# Patient Record
Sex: Female | Born: 1940 | Race: White | Hispanic: No | Smoking: Never smoker
Health system: Southern US, Community
[De-identification: ages and names within clinical notes are randomized; demographics above are authoritative.]

## PROBLEM LIST (undated history)

## (undated) DIAGNOSIS — M419 Scoliosis, unspecified: Secondary | ICD-10-CM

## (undated) DIAGNOSIS — D472 Monoclonal gammopathy: Secondary | ICD-10-CM

## (undated) DIAGNOSIS — I2510 Atherosclerotic heart disease of native coronary artery without angina pectoris: Secondary | ICD-10-CM

## (undated) DIAGNOSIS — T7840XA Allergy, unspecified, initial encounter: Secondary | ICD-10-CM

## (undated) DIAGNOSIS — K7689 Other specified diseases of liver: Secondary | ICD-10-CM

## (undated) DIAGNOSIS — M899 Disorder of bone, unspecified: Secondary | ICD-10-CM

## (undated) DIAGNOSIS — R209 Unspecified disturbances of skin sensation: Secondary | ICD-10-CM

## (undated) DIAGNOSIS — I8390 Asymptomatic varicose veins of unspecified lower extremity: Secondary | ICD-10-CM

## (undated) DIAGNOSIS — I428 Other cardiomyopathies: Secondary | ICD-10-CM

## (undated) DIAGNOSIS — I10 Essential (primary) hypertension: Secondary | ICD-10-CM

## (undated) DIAGNOSIS — I493 Ventricular premature depolarization: Secondary | ICD-10-CM

## (undated) DIAGNOSIS — M545 Low back pain: Secondary | ICD-10-CM

## (undated) DIAGNOSIS — K5790 Diverticulosis of intestine, part unspecified, without perforation or abscess without bleeding: Secondary | ICD-10-CM

## (undated) DIAGNOSIS — F329 Major depressive disorder, single episode, unspecified: Secondary | ICD-10-CM

## (undated) DIAGNOSIS — M949 Disorder of cartilage, unspecified: Secondary | ICD-10-CM

## (undated) DIAGNOSIS — D1803 Hemangioma of intra-abdominal structures: Secondary | ICD-10-CM

## (undated) DIAGNOSIS — E039 Hypothyroidism, unspecified: Secondary | ICD-10-CM

## (undated) DIAGNOSIS — M62838 Other muscle spasm: Secondary | ICD-10-CM

## (undated) DIAGNOSIS — K219 Gastro-esophageal reflux disease without esophagitis: Secondary | ICD-10-CM

## (undated) DIAGNOSIS — M1990 Unspecified osteoarthritis, unspecified site: Secondary | ICD-10-CM

## (undated) HISTORY — PX: OVARIAN CYST REMOVAL: SHX89

## (undated) HISTORY — DX: Monoclonal gammopathy: D47.2

## (undated) HISTORY — DX: Scoliosis, unspecified: M41.9

## (undated) HISTORY — DX: Allergy, unspecified, initial encounter: T78.40XA

## (undated) HISTORY — DX: Disorder of cartilage, unspecified: M94.9

## (undated) HISTORY — DX: Unspecified osteoarthritis, unspecified site: M19.90

## (undated) HISTORY — DX: Essential (primary) hypertension: I10

## (undated) HISTORY — DX: Other cardiomyopathies: I42.8

## (undated) HISTORY — PX: APPENDECTOMY: SHX54

## (undated) HISTORY — DX: Other muscle spasm: M62.838

## (undated) HISTORY — DX: Major depressive disorder, single episode, unspecified: F32.9

## (undated) HISTORY — DX: Diverticulosis of intestine, part unspecified, without perforation or abscess without bleeding: K57.90

## (undated) HISTORY — DX: Disorder of bone, unspecified: M89.9

## (undated) HISTORY — DX: Asymptomatic varicose veins of unspecified lower extremity: I83.90

## (undated) HISTORY — DX: Hypothyroidism, unspecified: E03.9

## (undated) HISTORY — DX: Low back pain: M54.5

## (undated) HISTORY — DX: Unspecified disturbances of skin sensation: R20.9

## (undated) HISTORY — DX: Ventricular premature depolarization: I49.3

## (undated) HISTORY — PX: BUNIONECTOMY: SHX129

## (undated) HISTORY — PX: MOHS SURGERY: SHX181

## (undated) HISTORY — PX: CATARACT EXTRACTION: SUR2

## (undated) HISTORY — PX: CARPAL TUNNEL RELEASE: SHX101

## (undated) HISTORY — DX: Hemangioma of intra-abdominal structures: D18.03

## (undated) HISTORY — DX: Other specified diseases of liver: K76.89

## (undated) HISTORY — DX: Atherosclerotic heart disease of native coronary artery without angina pectoris: I25.10

## (undated) HISTORY — DX: Gastro-esophageal reflux disease without esophagitis: K21.9

## (undated) HISTORY — PX: BACK SURGERY: SHX140

## (undated) HISTORY — PX: TONSILLECTOMY: SUR1361

---

## 1965-02-01 HISTORY — PX: OOPHORECTOMY: SHX86

## 1979-02-02 HISTORY — PX: ABDOMINAL HYSTERECTOMY: SHX81

## 1983-02-02 HISTORY — PX: BUNIONECTOMY: SHX129

## 1984-02-02 HISTORY — PX: HAMMER TOE SURGERY: SHX385

## 1985-02-01 HISTORY — PX: HAMMER TOE SURGERY: SHX385

## 1990-02-01 HISTORY — PX: CARPAL TUNNEL RELEASE: SHX101

## 1997-12-02 LAB — HM MAMMOGRAPHY

## 1998-05-30 ENCOUNTER — Encounter: Payer: Self-pay | Admitting: Orthopedic Surgery

## 1998-05-30 ENCOUNTER — Ambulatory Visit (HOSPITAL_COMMUNITY): Admission: RE | Admit: 1998-05-30 | Discharge: 1998-05-30 | Payer: Self-pay | Admitting: Orthopedic Surgery

## 1999-03-03 ENCOUNTER — Other Ambulatory Visit: Admission: RE | Admit: 1999-03-03 | Discharge: 1999-03-03 | Payer: Self-pay | Admitting: Obstetrics and Gynecology

## 1999-03-31 ENCOUNTER — Encounter: Payer: Self-pay | Admitting: Obstetrics and Gynecology

## 1999-03-31 ENCOUNTER — Encounter: Admission: RE | Admit: 1999-03-31 | Discharge: 1999-03-31 | Payer: Self-pay | Admitting: Obstetrics and Gynecology

## 2000-02-12 ENCOUNTER — Ambulatory Visit (HOSPITAL_COMMUNITY): Admission: RE | Admit: 2000-02-12 | Discharge: 2000-02-12 | Payer: Self-pay | Admitting: Internal Medicine

## 2000-02-12 ENCOUNTER — Encounter: Payer: Self-pay | Admitting: Internal Medicine

## 2000-05-06 ENCOUNTER — Encounter: Payer: Self-pay | Admitting: Emergency Medicine

## 2000-05-06 ENCOUNTER — Emergency Department (HOSPITAL_COMMUNITY): Admission: EM | Admit: 2000-05-06 | Discharge: 2000-05-06 | Payer: Self-pay | Admitting: Emergency Medicine

## 2001-02-15 ENCOUNTER — Encounter (INDEPENDENT_AMBULATORY_CARE_PROVIDER_SITE_OTHER): Payer: Self-pay | Admitting: Specialist

## 2001-02-15 ENCOUNTER — Ambulatory Visit (HOSPITAL_COMMUNITY): Admission: RE | Admit: 2001-02-15 | Discharge: 2001-02-15 | Payer: Self-pay | Admitting: Gastroenterology

## 2001-04-27 ENCOUNTER — Other Ambulatory Visit: Admission: RE | Admit: 2001-04-27 | Discharge: 2001-04-27 | Payer: Self-pay | Admitting: Obstetrics and Gynecology

## 2002-05-10 ENCOUNTER — Other Ambulatory Visit: Admission: RE | Admit: 2002-05-10 | Discharge: 2002-05-10 | Payer: Self-pay | Admitting: Obstetrics and Gynecology

## 2002-11-27 ENCOUNTER — Encounter (INDEPENDENT_AMBULATORY_CARE_PROVIDER_SITE_OTHER): Payer: Self-pay | Admitting: Specialist

## 2002-11-27 ENCOUNTER — Ambulatory Visit (HOSPITAL_COMMUNITY): Admission: RE | Admit: 2002-11-27 | Discharge: 2002-11-27 | Payer: Self-pay | Admitting: Oncology

## 2002-12-11 ENCOUNTER — Ambulatory Visit (HOSPITAL_COMMUNITY): Admission: RE | Admit: 2002-12-11 | Discharge: 2002-12-11 | Payer: Self-pay | Admitting: Internal Medicine

## 2003-12-05 ENCOUNTER — Other Ambulatory Visit: Admission: RE | Admit: 2003-12-05 | Discharge: 2003-12-05 | Payer: Self-pay | Admitting: Obstetrics and Gynecology

## 2003-12-11 ENCOUNTER — Ambulatory Visit: Payer: Self-pay | Admitting: Internal Medicine

## 2003-12-13 ENCOUNTER — Emergency Department (HOSPITAL_COMMUNITY): Admission: EM | Admit: 2003-12-13 | Discharge: 2003-12-13 | Payer: Self-pay | Admitting: Emergency Medicine

## 2003-12-18 ENCOUNTER — Ambulatory Visit (HOSPITAL_COMMUNITY): Admission: RE | Admit: 2003-12-18 | Discharge: 2003-12-18 | Payer: Self-pay | Admitting: Internal Medicine

## 2003-12-18 ENCOUNTER — Ambulatory Visit: Payer: Self-pay | Admitting: Internal Medicine

## 2004-01-01 ENCOUNTER — Ambulatory Visit: Payer: Self-pay | Admitting: Internal Medicine

## 2004-01-29 ENCOUNTER — Ambulatory Visit: Payer: Self-pay | Admitting: Internal Medicine

## 2004-02-02 HISTORY — PX: LUMBAR FUSION: SHX111

## 2004-02-17 ENCOUNTER — Ambulatory Visit: Payer: Self-pay | Admitting: Oncology

## 2004-03-03 ENCOUNTER — Ambulatory Visit: Payer: Self-pay | Admitting: Internal Medicine

## 2004-03-17 ENCOUNTER — Inpatient Hospital Stay (HOSPITAL_COMMUNITY): Admission: RE | Admit: 2004-03-17 | Discharge: 2004-03-24 | Payer: Self-pay | Admitting: Neurosurgery

## 2004-03-17 ENCOUNTER — Ambulatory Visit: Payer: Self-pay | Admitting: Physical Medicine & Rehabilitation

## 2004-03-24 ENCOUNTER — Inpatient Hospital Stay
Admission: RE | Admit: 2004-03-24 | Discharge: 2004-03-31 | Payer: Self-pay | Admitting: Physical Medicine & Rehabilitation

## 2004-06-03 ENCOUNTER — Ambulatory Visit: Payer: Self-pay | Admitting: Internal Medicine

## 2004-06-12 ENCOUNTER — Ambulatory Visit: Payer: Self-pay | Admitting: Oncology

## 2004-08-03 ENCOUNTER — Ambulatory Visit: Payer: Self-pay | Admitting: Internal Medicine

## 2004-08-05 ENCOUNTER — Ambulatory Visit: Payer: Self-pay | Admitting: Internal Medicine

## 2004-10-07 ENCOUNTER — Ambulatory Visit: Payer: Self-pay | Admitting: Internal Medicine

## 2004-11-02 ENCOUNTER — Ambulatory Visit: Payer: Self-pay | Admitting: Internal Medicine

## 2004-12-08 ENCOUNTER — Ambulatory Visit: Payer: Self-pay | Admitting: Internal Medicine

## 2005-02-09 ENCOUNTER — Ambulatory Visit: Payer: Self-pay | Admitting: Internal Medicine

## 2005-04-13 ENCOUNTER — Ambulatory Visit: Payer: Self-pay | Admitting: Internal Medicine

## 2005-05-13 ENCOUNTER — Ambulatory Visit: Payer: Self-pay | Admitting: Internal Medicine

## 2005-06-14 ENCOUNTER — Ambulatory Visit: Payer: Self-pay | Admitting: Oncology

## 2005-06-17 LAB — IGG, IGA, IGM
IgA: 52 mg/dL — ABNORMAL LOW (ref 68–378)
IgG (Immunoglobin G), Serum: 470 mg/dL — ABNORMAL LOW (ref 694–1618)
IgM, Serum: 1000 mg/dL — ABNORMAL HIGH (ref 60–263)

## 2005-06-17 LAB — COMPREHENSIVE METABOLIC PANEL
ALT: 22 U/L (ref 0–40)
AST: 21 U/L (ref 0–37)
Albumin: 4 g/dL (ref 3.5–5.2)
Alkaline Phosphatase: 70 U/L (ref 39–117)
BUN: 19 mg/dL (ref 6–23)
CO2: 27 mEq/L (ref 19–32)
Calcium: 8.6 mg/dL (ref 8.4–10.5)
Chloride: 102 mEq/L (ref 96–112)
Creatinine, Ser: 1.1 mg/dL (ref 0.4–1.2)
Glucose, Bld: 95 mg/dL (ref 70–99)
Potassium: 4.6 mEq/L (ref 3.5–5.3)
Sodium: 137 mEq/L (ref 135–145)
Total Bilirubin: 0.2 mg/dL — ABNORMAL LOW (ref 0.3–1.2)
Total Protein: 6.8 g/dL (ref 6.0–8.3)

## 2005-06-17 LAB — LACTATE DEHYDROGENASE: LDH: 177 U/L (ref 94–250)

## 2005-06-17 LAB — BETA 2 MICROGLOBULIN, SERUM: Beta-2 Microglobulin: 1.41 mg/L (ref 1.01–1.73)

## 2005-08-26 ENCOUNTER — Ambulatory Visit: Payer: Self-pay | Admitting: Internal Medicine

## 2005-12-13 ENCOUNTER — Ambulatory Visit: Payer: Self-pay | Admitting: Internal Medicine

## 2006-02-01 HISTORY — PX: CATARACT EXTRACTION: SUR2

## 2006-03-21 ENCOUNTER — Ambulatory Visit: Payer: Self-pay | Admitting: Vascular Surgery

## 2006-03-29 ENCOUNTER — Ambulatory Visit: Payer: Self-pay | Admitting: Vascular Surgery

## 2006-04-07 ENCOUNTER — Ambulatory Visit: Payer: Self-pay | Admitting: Internal Medicine

## 2006-04-07 LAB — CONVERTED CEMR LAB
ALT: 17 units/L (ref 0–40)
AST: 19 units/L (ref 0–37)
BUN: 18 mg/dL (ref 6–23)
Basophils Absolute: 0.1 10*3/uL (ref 0.0–0.1)
Basophils Relative: 0.8 % (ref 0.0–1.0)
CO2: 34 meq/L — ABNORMAL HIGH (ref 19–32)
Calcium: 9.3 mg/dL (ref 8.4–10.5)
Chloride: 101 meq/L (ref 96–112)
Creatinine, Ser: 1 mg/dL (ref 0.4–1.2)
Eosinophils Absolute: 0.2 10*3/uL (ref 0.0–0.6)
Eosinophils Relative: 2.6 % (ref 0.0–5.0)
GFR calc Af Amer: 72 mL/min
GFR calc non Af Amer: 59 mL/min
Glucose, Bld: 92 mg/dL (ref 70–99)
HCT: 38.4 % (ref 36.0–46.0)
Hemoglobin: 13.4 g/dL (ref 12.0–15.0)
Lymphocytes Relative: 29.9 % (ref 12.0–46.0)
MCHC: 35 g/dL (ref 30.0–36.0)
MCV: 89.9 fL (ref 78.0–100.0)
Monocytes Absolute: 0.8 10*3/uL — ABNORMAL HIGH (ref 0.2–0.7)
Monocytes Relative: 11 % (ref 3.0–11.0)
Neutro Abs: 3.9 10*3/uL (ref 1.4–7.7)
Neutrophils Relative %: 55.7 % (ref 43.0–77.0)
Platelets: 499 10*3/uL — ABNORMAL HIGH (ref 150–400)
Potassium: 4.6 meq/L (ref 3.5–5.1)
RBC: 4.27 M/uL (ref 3.87–5.11)
RDW: 11.5 % (ref 11.5–14.6)
Sodium: 140 meq/L (ref 135–145)
TSH: 1.69 microintl units/mL (ref 0.35–5.50)
Vit D, 1,25-Dihydroxy: 47 (ref 20–57)
WBC: 7.2 10*3/uL (ref 4.5–10.5)

## 2006-06-13 ENCOUNTER — Ambulatory Visit: Payer: Self-pay | Admitting: Oncology

## 2006-06-15 LAB — CBC WITH DIFFERENTIAL/PLATELET
BASO%: 0.7 % (ref 0.0–2.0)
Basophils Absolute: 0.1 10*3/uL (ref 0.0–0.1)
EOS%: 2.6 % (ref 0.0–7.0)
Eosinophils Absolute: 0.2 10*3/uL (ref 0.0–0.5)
HCT: 37.6 % (ref 34.8–46.6)
HGB: 13.1 g/dL (ref 11.6–15.9)
LYMPH%: 31.8 % (ref 14.0–48.0)
MCH: 30.8 pg (ref 26.0–34.0)
MCHC: 34.9 g/dL (ref 32.0–36.0)
MCV: 88.2 fL (ref 81.0–101.0)
MONO#: 0.9 10*3/uL (ref 0.1–0.9)
MONO%: 12 % (ref 0.0–13.0)
NEUT#: 4.1 10*3/uL (ref 1.5–6.5)
NEUT%: 52.9 % (ref 39.6–76.8)
Platelets: 406 10*3/uL — ABNORMAL HIGH (ref 145–400)
RBC: 4.27 10*6/uL (ref 3.70–5.32)
RDW: 13.1 % (ref 11.3–14.5)
WBC: 7.8 10*3/uL (ref 3.9–10.0)
lymph#: 2.5 10*3/uL (ref 0.9–3.3)

## 2006-06-17 LAB — COMPREHENSIVE METABOLIC PANEL
ALT: 20 U/L (ref 0–35)
AST: 21 U/L (ref 0–37)
Albumin: 3.8 g/dL (ref 3.5–5.2)
Alkaline Phosphatase: 67 U/L (ref 39–117)
BUN: 24 mg/dL — ABNORMAL HIGH (ref 6–23)
CO2: 30 mEq/L (ref 19–32)
Calcium: 9.4 mg/dL (ref 8.4–10.5)
Chloride: 99 mEq/L (ref 96–112)
Creatinine, Ser: 0.92 mg/dL (ref 0.40–1.20)
Glucose, Bld: 103 mg/dL — ABNORMAL HIGH (ref 70–99)
Potassium: 4.8 mEq/L (ref 3.5–5.3)
Sodium: 137 mEq/L (ref 135–145)
Total Bilirubin: 0.3 mg/dL (ref 0.3–1.2)
Total Protein: 6.8 g/dL (ref 6.0–8.3)

## 2006-06-17 LAB — IMMUNOFIXATION ELECTROPHORESIS
IgA: 53 mg/dL — ABNORMAL LOW (ref 68–378)
IgG (Immunoglobin G), Serum: 483 mg/dL — ABNORMAL LOW (ref 694–1618)
IgM, Serum: 1060 mg/dL — ABNORMAL HIGH (ref 60–263)
Total Protein, Serum Electrophoresis: 6.8 g/dL (ref 6.0–8.3)

## 2006-06-17 LAB — BETA 2 MICROGLOBULIN, SERUM: Beta-2 Microglobulin: 1.66 mg/L (ref 1.01–1.73)

## 2006-06-21 ENCOUNTER — Ambulatory Visit: Payer: Self-pay | Admitting: Vascular Surgery

## 2006-07-12 ENCOUNTER — Ambulatory Visit: Payer: Self-pay | Admitting: Internal Medicine

## 2006-07-26 ENCOUNTER — Ambulatory Visit: Payer: Self-pay | Admitting: Internal Medicine

## 2006-08-29 ENCOUNTER — Encounter: Payer: Self-pay | Admitting: Internal Medicine

## 2006-08-29 DIAGNOSIS — K219 Gastro-esophageal reflux disease without esophagitis: Secondary | ICD-10-CM | POA: Insufficient documentation

## 2006-08-29 DIAGNOSIS — I8390 Asymptomatic varicose veins of unspecified lower extremity: Secondary | ICD-10-CM | POA: Insufficient documentation

## 2006-08-29 DIAGNOSIS — M159 Polyosteoarthritis, unspecified: Secondary | ICD-10-CM | POA: Insufficient documentation

## 2006-08-29 DIAGNOSIS — F329 Major depressive disorder, single episode, unspecified: Secondary | ICD-10-CM | POA: Insufficient documentation

## 2006-08-29 DIAGNOSIS — M1990 Unspecified osteoarthritis, unspecified site: Secondary | ICD-10-CM

## 2006-08-29 DIAGNOSIS — M545 Low back pain, unspecified: Secondary | ICD-10-CM

## 2006-08-29 DIAGNOSIS — E039 Hypothyroidism, unspecified: Secondary | ICD-10-CM | POA: Insufficient documentation

## 2006-08-29 DIAGNOSIS — M79609 Pain in unspecified limb: Secondary | ICD-10-CM | POA: Insufficient documentation

## 2006-08-29 DIAGNOSIS — F3289 Other specified depressive episodes: Secondary | ICD-10-CM

## 2006-08-29 HISTORY — DX: Other specified depressive episodes: F32.89

## 2006-08-29 HISTORY — DX: Asymptomatic varicose veins of unspecified lower extremity: I83.90

## 2006-08-29 HISTORY — DX: Hypothyroidism, unspecified: E03.9

## 2006-08-29 HISTORY — DX: Low back pain, unspecified: M54.50

## 2006-08-29 HISTORY — DX: Unspecified osteoarthritis, unspecified site: M19.90

## 2006-08-29 HISTORY — DX: Gastro-esophageal reflux disease without esophagitis: K21.9

## 2006-08-29 HISTORY — DX: Major depressive disorder, single episode, unspecified: F32.9

## 2006-10-13 ENCOUNTER — Ambulatory Visit: Payer: Self-pay | Admitting: Internal Medicine

## 2006-10-13 LAB — CONVERTED CEMR LAB
ALT: 24 units/L (ref 0–35)
AST: 26 units/L (ref 0–37)
Albumin: 3.6 g/dL (ref 3.5–5.2)
Alkaline Phosphatase: 68 units/L (ref 39–117)
BUN: 17 mg/dL (ref 6–23)
Bilirubin, Direct: 0.1 mg/dL (ref 0.0–0.3)
CO2: 30 meq/L (ref 19–32)
Calcium: 9.2 mg/dL (ref 8.4–10.5)
Chloride: 105 meq/L (ref 96–112)
Creatinine, Ser: 0.9 mg/dL (ref 0.4–1.2)
GFR calc Af Amer: 81 mL/min
GFR calc non Af Amer: 67 mL/min
Glucose, Bld: 96 mg/dL (ref 70–99)
Potassium: 5 meq/L (ref 3.5–5.1)
Sodium: 140 meq/L (ref 135–145)
TSH: 0.99 microintl units/mL (ref 0.35–5.50)
Total Bilirubin: 0.7 mg/dL (ref 0.3–1.2)
Total Protein: 7.1 g/dL (ref 6.0–8.3)

## 2007-01-06 ENCOUNTER — Encounter: Payer: Self-pay | Admitting: Internal Medicine

## 2007-01-23 ENCOUNTER — Ambulatory Visit: Payer: Self-pay | Admitting: Internal Medicine

## 2007-04-25 ENCOUNTER — Ambulatory Visit: Payer: Self-pay | Admitting: Internal Medicine

## 2007-04-25 DIAGNOSIS — R498 Other voice and resonance disorders: Secondary | ICD-10-CM

## 2007-04-25 DIAGNOSIS — J309 Allergic rhinitis, unspecified: Secondary | ICD-10-CM | POA: Insufficient documentation

## 2007-04-25 HISTORY — DX: Other voice and resonance disorders: R49.8

## 2007-06-07 ENCOUNTER — Encounter: Payer: Self-pay | Admitting: Internal Medicine

## 2007-06-12 ENCOUNTER — Ambulatory Visit: Payer: Self-pay | Admitting: Oncology

## 2007-06-21 ENCOUNTER — Encounter: Payer: Self-pay | Admitting: Internal Medicine

## 2007-07-07 ENCOUNTER — Encounter: Payer: Self-pay | Admitting: Internal Medicine

## 2007-07-28 ENCOUNTER — Encounter: Payer: Self-pay | Admitting: Internal Medicine

## 2007-07-30 ENCOUNTER — Encounter: Payer: Self-pay | Admitting: Internal Medicine

## 2007-07-30 ENCOUNTER — Encounter: Admission: RE | Admit: 2007-07-30 | Discharge: 2007-07-30 | Payer: Self-pay | Admitting: Neurosurgery

## 2007-08-01 ENCOUNTER — Encounter: Payer: Self-pay | Admitting: Internal Medicine

## 2007-08-02 ENCOUNTER — Ambulatory Visit: Payer: Self-pay | Admitting: Internal Medicine

## 2007-08-02 DIAGNOSIS — K7689 Other specified diseases of liver: Secondary | ICD-10-CM | POA: Insufficient documentation

## 2007-08-02 HISTORY — DX: Other specified diseases of liver: K76.89

## 2007-08-08 ENCOUNTER — Encounter: Admission: RE | Admit: 2007-08-08 | Discharge: 2007-08-08 | Payer: Self-pay | Admitting: Internal Medicine

## 2007-10-04 ENCOUNTER — Encounter: Payer: Self-pay | Admitting: Internal Medicine

## 2007-11-10 ENCOUNTER — Ambulatory Visit: Payer: Self-pay | Admitting: Internal Medicine

## 2008-01-03 ENCOUNTER — Encounter: Payer: Self-pay | Admitting: Internal Medicine

## 2008-02-02 HISTORY — PX: LUMBAR FUSION: SHX111

## 2008-02-05 ENCOUNTER — Ambulatory Visit: Payer: Self-pay | Admitting: Internal Medicine

## 2008-02-05 LAB — CONVERTED CEMR LAB
ALT: 21 units/L (ref 0–35)
AST: 25 units/L (ref 0–37)
Albumin: 3.6 g/dL (ref 3.5–5.2)
Alkaline Phosphatase: 74 units/L (ref 39–117)
BUN: 18 mg/dL (ref 6–23)
Bacteria, UA: NEGATIVE
Bilirubin Urine: NEGATIVE
Bilirubin, Direct: 0.1 mg/dL (ref 0.0–0.3)
CO2: 32 meq/L (ref 19–32)
Calcium: 8.9 mg/dL (ref 8.4–10.5)
Chloride: 107 meq/L (ref 96–112)
Cholesterol: 231 mg/dL (ref 0–200)
Creatinine, Ser: 1 mg/dL (ref 0.4–1.2)
Crystals: NEGATIVE
Direct LDL: 131 mg/dL
GFR calc Af Amer: 71 mL/min
GFR calc non Af Amer: 59 mL/min
Glucose, Bld: 107 mg/dL — ABNORMAL HIGH (ref 70–99)
HDL: 80.8 mg/dL (ref >39.0)
Hemoglobin, Urine: NEGATIVE
Ketones, ur: NEGATIVE mg/dL
Mucus, UA: NEGATIVE
Nitrite: NEGATIVE
Potassium: 4.5 meq/L (ref 3.5–5.1)
RBC / HPF: NONE SEEN
Sodium: 142 meq/L (ref 135–145)
Specific Gravity, Urine: 1.015 (ref 1.000–1.03)
TSH: 1.39 microintl units/mL (ref 0.35–5.50)
Total Bilirubin: 0.6 mg/dL (ref 0.3–1.2)
Total CHOL/HDL Ratio: 2.9
Total Protein, Urine: NEGATIVE mg/dL
Total Protein: 6.6 g/dL (ref 6.0–8.3)
Triglycerides: 56 mg/dL (ref 0–149)
Urine Glucose: NEGATIVE mg/dL
Urobilinogen, UA: 0.2 (ref 0.0–1.0)
VLDL: 11 mg/dL (ref 0–40)
pH: 6.5 (ref 5.0–8.0)

## 2008-02-08 ENCOUNTER — Encounter: Admission: RE | Admit: 2008-02-08 | Discharge: 2008-02-08 | Payer: Self-pay | Admitting: General Practice

## 2008-02-13 ENCOUNTER — Ambulatory Visit: Payer: Self-pay | Admitting: Internal Medicine

## 2008-03-26 ENCOUNTER — Encounter: Payer: Self-pay | Admitting: Internal Medicine

## 2008-04-01 ENCOUNTER — Encounter: Admission: RE | Admit: 2008-04-01 | Discharge: 2008-04-01 | Payer: Self-pay | Admitting: Neurosurgery

## 2008-04-10 ENCOUNTER — Encounter: Payer: Self-pay | Admitting: Internal Medicine

## 2008-04-23 ENCOUNTER — Inpatient Hospital Stay (HOSPITAL_COMMUNITY): Admission: RE | Admit: 2008-04-23 | Discharge: 2008-04-29 | Payer: Self-pay | Admitting: Neurosurgery

## 2008-05-22 ENCOUNTER — Encounter: Payer: Self-pay | Admitting: Internal Medicine

## 2008-06-17 ENCOUNTER — Encounter: Payer: Self-pay | Admitting: Internal Medicine

## 2008-06-26 ENCOUNTER — Encounter: Payer: Self-pay | Admitting: Internal Medicine

## 2008-07-31 ENCOUNTER — Encounter: Payer: Self-pay | Admitting: Internal Medicine

## 2008-08-06 ENCOUNTER — Ambulatory Visit: Payer: Self-pay | Admitting: Internal Medicine

## 2008-08-06 LAB — CONVERTED CEMR LAB
BUN: 19 mg/dL (ref 6–23)
CO2: 32 meq/L (ref 19–32)
Calcium: 8.9 mg/dL (ref 8.4–10.5)
Chloride: 103 meq/L (ref 96–112)
Creatinine, Ser: 0.9 mg/dL (ref 0.4–1.2)
GFR calc non Af Amer: 66.14 mL/min (ref >60)
Glucose, Bld: 100 mg/dL — ABNORMAL HIGH (ref 70–99)
Potassium: 4.6 meq/L (ref 3.5–5.1)
Sodium: 142 meq/L (ref 135–145)

## 2008-08-14 ENCOUNTER — Ambulatory Visit: Payer: Self-pay | Admitting: Internal Medicine

## 2008-10-23 ENCOUNTER — Ambulatory Visit: Payer: Self-pay | Admitting: Internal Medicine

## 2008-10-23 ENCOUNTER — Encounter: Payer: Self-pay | Admitting: Internal Medicine

## 2008-10-30 ENCOUNTER — Telehealth: Payer: Self-pay | Admitting: Internal Medicine

## 2008-10-30 ENCOUNTER — Encounter: Payer: Self-pay | Admitting: Internal Medicine

## 2008-10-31 ENCOUNTER — Ambulatory Visit: Payer: Self-pay | Admitting: Vascular Surgery

## 2008-10-31 ENCOUNTER — Encounter: Payer: Self-pay | Admitting: Internal Medicine

## 2008-10-31 ENCOUNTER — Ambulatory Visit: Payer: Self-pay | Admitting: Internal Medicine

## 2008-11-19 ENCOUNTER — Ambulatory Visit: Payer: Self-pay | Admitting: Internal Medicine

## 2008-11-19 DIAGNOSIS — M899 Disorder of bone, unspecified: Secondary | ICD-10-CM

## 2008-11-19 DIAGNOSIS — M949 Disorder of cartilage, unspecified: Secondary | ICD-10-CM

## 2008-11-19 HISTORY — DX: Disorder of bone, unspecified: M89.9

## 2008-11-21 ENCOUNTER — Telehealth: Payer: Self-pay | Admitting: Internal Medicine

## 2008-11-26 ENCOUNTER — Encounter: Payer: Self-pay | Admitting: Internal Medicine

## 2008-11-27 ENCOUNTER — Ambulatory Visit: Payer: Self-pay | Admitting: Internal Medicine

## 2008-11-27 ENCOUNTER — Telehealth: Payer: Self-pay | Admitting: Internal Medicine

## 2008-11-29 ENCOUNTER — Ambulatory Visit: Payer: Self-pay | Admitting: Internal Medicine

## 2009-03-05 ENCOUNTER — Ambulatory Visit: Payer: Self-pay | Admitting: Internal Medicine

## 2009-03-05 DIAGNOSIS — R209 Unspecified disturbances of skin sensation: Secondary | ICD-10-CM

## 2009-03-05 HISTORY — DX: Unspecified disturbances of skin sensation: R20.9

## 2009-03-07 LAB — CONVERTED CEMR LAB
ALT: 16 units/L (ref 0–35)
AST: 21 units/L (ref 0–37)
Albumin: 3.8 g/dL (ref 3.5–5.2)
Alkaline Phosphatase: 74 units/L (ref 39–117)
BUN: 18 mg/dL (ref 6–23)
Basophils Absolute: 0.1 10*3/uL (ref 0.0–0.1)
Basophils Relative: 1 % (ref 0.0–3.0)
Bilirubin, Direct: 0.1 mg/dL (ref 0.0–0.3)
CO2: 29 meq/L (ref 19–32)
Calcium: 8.9 mg/dL (ref 8.4–10.5)
Chloride: 106 meq/L (ref 96–112)
Creatinine, Ser: 0.9 mg/dL (ref 0.4–1.2)
Eosinophils Absolute: 0.2 10*3/uL (ref 0.0–0.7)
Eosinophils Relative: 2.7 % (ref 0.0–5.0)
GFR calc non Af Amer: 66.02 mL/min (ref >60)
Glucose, Bld: 91 mg/dL (ref 70–99)
HCT: 34.7 % — ABNORMAL LOW (ref 36.0–46.0)
Hemoglobin: 12.1 g/dL (ref 12.0–15.0)
Lymphocytes Relative: 33.5 % (ref 12.0–46.0)
Lymphs Abs: 1.9 10*3/uL (ref 0.7–4.0)
MCHC: 34.9 g/dL (ref 30.0–36.0)
MCV: 87.7 fL (ref 78.0–100.0)
Monocytes Absolute: 0.9 10*3/uL (ref 0.1–1.0)
Monocytes Relative: 16.1 % — ABNORMAL HIGH (ref 3.0–12.0)
Neutro Abs: 2.6 10*3/uL (ref 1.4–7.7)
Neutrophils Relative %: 46.7 % (ref 43.0–77.0)
Platelets: 363 10*3/uL (ref 150.0–400.0)
Potassium: 5.2 meq/L — ABNORMAL HIGH (ref 3.5–5.1)
RBC: 3.96 M/uL (ref 3.87–5.11)
RDW: 13.7 % (ref 11.5–14.6)
Sodium: 141 meq/L (ref 135–145)
TSH: 1.13 microintl units/mL (ref 0.35–5.50)
Total Bilirubin: 0.3 mg/dL (ref 0.3–1.2)
Total Protein: 6.8 g/dL (ref 6.0–8.3)
Vitamin B-12: 872 pg/mL (ref 211–911)
WBC: 5.7 10*3/uL (ref 4.5–10.5)

## 2009-04-30 ENCOUNTER — Encounter: Payer: Self-pay | Admitting: Internal Medicine

## 2009-05-06 ENCOUNTER — Ambulatory Visit: Payer: Self-pay | Admitting: Internal Medicine

## 2009-06-09 ENCOUNTER — Encounter: Payer: Self-pay | Admitting: Internal Medicine

## 2009-06-25 ENCOUNTER — Telehealth (INDEPENDENT_AMBULATORY_CARE_PROVIDER_SITE_OTHER): Payer: Self-pay | Admitting: *Deleted

## 2009-06-26 ENCOUNTER — Ambulatory Visit: Payer: Self-pay | Admitting: Internal Medicine

## 2009-07-01 LAB — CONVERTED CEMR LAB
BUN: 22 mg/dL (ref 6–23)
Basophils Absolute: 0.1 10*3/uL (ref 0.0–0.1)
Basophils Relative: 1 % (ref 0.0–3.0)
Bilirubin Urine: NEGATIVE
CO2: 30 meq/L (ref 19–32)
Calcium: 9 mg/dL (ref 8.4–10.5)
Chloride: 102 meq/L (ref 96–112)
Creatinine, Ser: 0.8 mg/dL (ref 0.4–1.2)
Eosinophils Absolute: 0.1 10*3/uL (ref 0.0–0.7)
Eosinophils Relative: 1.8 % (ref 0.0–5.0)
GFR calc non Af Amer: 76.67 mL/min (ref >60)
Glucose, Bld: 77 mg/dL (ref 70–99)
HCT: 35.7 % — ABNORMAL LOW (ref 36.0–46.0)
Hemoglobin, Urine: NEGATIVE
Hemoglobin: 12.3 g/dL (ref 12.0–15.0)
Ketones, ur: NEGATIVE mg/dL
Leukocytes, UA: NEGATIVE
Lymphocytes Relative: 35.9 % (ref 12.0–46.0)
Lymphs Abs: 2.5 10*3/uL (ref 0.7–4.0)
MCHC: 34.5 g/dL (ref 30.0–36.0)
MCV: 89.1 fL (ref 78.0–100.0)
Monocytes Absolute: 1 10*3/uL (ref 0.1–1.0)
Monocytes Relative: 13.9 % — ABNORMAL HIGH (ref 3.0–12.0)
Neutro Abs: 3.2 10*3/uL (ref 1.4–7.7)
Neutrophils Relative %: 47.4 % (ref 43.0–77.0)
Nitrite: NEGATIVE
Platelets: 379 10*3/uL (ref 150.0–400.0)
Potassium: 5.3 meq/L — ABNORMAL HIGH (ref 3.5–5.1)
RBC: 4 M/uL (ref 3.87–5.11)
RDW: 14.5 % (ref 11.5–14.6)
Sed Rate: 33 mm/hr — ABNORMAL HIGH (ref 0–22)
Sodium: 138 meq/L (ref 135–145)
Specific Gravity, Urine: 1.01 (ref 1.000–1.030)
TSH: 0.79 microintl units/mL (ref 0.35–5.50)
Total Protein, Urine: NEGATIVE mg/dL
Urine Glucose: NEGATIVE mg/dL
Urobilinogen, UA: 0.2 (ref 0.0–1.0)
WBC: 6.8 10*3/uL (ref 4.5–10.5)
pH: 5.5 (ref 5.0–8.0)

## 2009-07-07 ENCOUNTER — Telehealth: Payer: Self-pay | Admitting: Internal Medicine

## 2009-07-16 ENCOUNTER — Telehealth: Payer: Self-pay | Admitting: Internal Medicine

## 2009-07-17 ENCOUNTER — Ambulatory Visit: Payer: Self-pay | Admitting: Internal Medicine

## 2009-07-24 ENCOUNTER — Telehealth: Payer: Self-pay | Admitting: Internal Medicine

## 2009-08-13 ENCOUNTER — Encounter: Payer: Self-pay | Admitting: Internal Medicine

## 2009-11-25 ENCOUNTER — Ambulatory Visit: Payer: Self-pay | Admitting: Internal Medicine

## 2009-11-25 LAB — CONVERTED CEMR LAB
ALT: 18 units/L (ref 0–35)
AST: 21 units/L (ref 0–37)
Albumin: 3.8 g/dL (ref 3.5–5.2)
Alkaline Phosphatase: 79 units/L (ref 39–117)
BUN: 26 mg/dL — ABNORMAL HIGH (ref 6–23)
Bilirubin, Direct: 0 mg/dL (ref 0.0–0.3)
CO2: 30 meq/L (ref 19–32)
Calcium: 9.8 mg/dL (ref 8.4–10.5)
Chloride: 101 meq/L (ref 96–112)
Creatinine, Ser: 0.9 mg/dL (ref 0.4–1.2)
GFR calc non Af Amer: 68.51 mL/min (ref >60)
Glucose, Bld: 88 mg/dL (ref 70–99)
Potassium: 4.8 meq/L (ref 3.5–5.1)
Sed Rate: 29 mm/hr — ABNORMAL HIGH (ref 0–22)
Sodium: 139 meq/L (ref 135–145)
Total Bilirubin: 0.3 mg/dL (ref 0.3–1.2)
Total Protein: 6.2 g/dL (ref 6.0–8.3)
Vitamin B-12: 659 pg/mL (ref 211–911)

## 2009-12-01 ENCOUNTER — Ambulatory Visit: Payer: Self-pay | Admitting: Internal Medicine

## 2009-12-01 DIAGNOSIS — B001 Herpesviral vesicular dermatitis: Secondary | ICD-10-CM | POA: Insufficient documentation

## 2010-02-16 ENCOUNTER — Encounter: Payer: Self-pay | Admitting: Internal Medicine

## 2010-02-24 ENCOUNTER — Other Ambulatory Visit: Payer: Self-pay | Admitting: Internal Medicine

## 2010-02-24 ENCOUNTER — Encounter: Payer: Self-pay | Admitting: Internal Medicine

## 2010-02-24 ENCOUNTER — Ambulatory Visit
Admission: RE | Admit: 2010-02-24 | Discharge: 2010-02-24 | Payer: Self-pay | Source: Home / Self Care | Attending: Internal Medicine | Admitting: Internal Medicine

## 2010-02-24 LAB — TSH: TSH: 1.26 u[IU]/mL (ref 0.35–5.50)

## 2010-02-24 LAB — HEPATIC FUNCTION PANEL
ALT: 18 U/L (ref 0–35)
AST: 19 U/L (ref 0–37)
Albumin: 3.8 g/dL (ref 3.5–5.2)
Alkaline Phosphatase: 72 U/L (ref 39–117)
Bilirubin, Direct: 0 mg/dL (ref 0.0–0.3)
Total Bilirubin: 0.4 mg/dL (ref 0.3–1.2)
Total Protein: 6.5 g/dL (ref 6.0–8.3)

## 2010-02-24 LAB — CBC WITH DIFFERENTIAL/PLATELET
Basophils Absolute: 0 10*3/uL (ref 0.0–0.1)
Basophils Relative: 0.5 % (ref 0.0–3.0)
Eosinophils Absolute: 0.1 10*3/uL (ref 0.0–0.7)
Eosinophils Relative: 1.8 % (ref 0.0–5.0)
HCT: 38.6 % (ref 36.0–46.0)
Hemoglobin: 13.3 g/dL (ref 12.0–15.0)
Lymphocytes Relative: 40.9 % (ref 12.0–46.0)
Lymphs Abs: 2.6 10*3/uL (ref 0.7–4.0)
MCHC: 34.4 g/dL (ref 30.0–36.0)
MCV: 90.1 fl (ref 78.0–100.0)
Monocytes Absolute: 0.9 10*3/uL (ref 0.1–1.0)
Monocytes Relative: 14.1 % — ABNORMAL HIGH (ref 3.0–12.0)
Neutro Abs: 2.7 10*3/uL (ref 1.4–7.7)
Neutrophils Relative %: 42.7 % — ABNORMAL LOW (ref 43.0–77.0)
Platelets: 383 10*3/uL (ref 150.0–400.0)
RBC: 4.29 Mil/uL (ref 3.87–5.11)
RDW: 13.4 % (ref 11.5–14.6)
WBC: 6.4 10*3/uL (ref 4.5–10.5)

## 2010-02-24 LAB — BASIC METABOLIC PANEL
BUN: 19 mg/dL (ref 6–23)
CO2: 32 mEq/L (ref 19–32)
Calcium: 9.1 mg/dL (ref 8.4–10.5)
Chloride: 103 mEq/L (ref 96–112)
Creatinine, Ser: 0.9 mg/dL (ref 0.4–1.2)
GFR: 64.19 mL/min (ref >60.00)
Glucose, Bld: 86 mg/dL (ref 70–99)
Potassium: 5 mEq/L (ref 3.5–5.1)
Sodium: 141 mEq/L (ref 135–145)

## 2010-02-24 LAB — URINALYSIS
Bilirubin Urine: NEGATIVE
Hemoglobin, Urine: NEGATIVE
Ketones, ur: NEGATIVE
Leukocytes, UA: NEGATIVE
Nitrite: NEGATIVE
Specific Gravity, Urine: 1.01 (ref 1.000–1.030)
Total Protein, Urine: NEGATIVE
Urine Glucose: NEGATIVE
Urobilinogen, UA: 0.2 (ref 0.0–1.0)
pH: 7 (ref 5.0–8.0)

## 2010-02-24 LAB — LIPID PANEL
Cholesterol: 197 mg/dL (ref 0–200)
HDL: 69.3 mg/dL (ref >39.00)
LDL Cholesterol: 116 mg/dL — ABNORMAL HIGH (ref 0–99)
Total CHOL/HDL Ratio: 3
Triglycerides: 60 mg/dL (ref 0.0–149.0)
VLDL: 12 mg/dL (ref 0.0–40.0)

## 2010-02-26 LAB — CONVERTED CEMR LAB
Albumin ELP: 57.4 % (ref 55.8–66.1)
Alpha-1-Globulin: 4 % (ref 2.9–4.9)
Alpha-2-Globulin: 11.2 % (ref 7.1–11.8)
Beta Globulin: 6.4 % (ref 4.7–7.2)
Gamma Globulin: 5.5 % — ABNORMAL LOW (ref 11.1–18.8)
M-Spike, %: 0.93
Total Protein, Serum Electrophoresis: 7.2 g/dL (ref 6.0–8.3)

## 2010-03-03 ENCOUNTER — Ambulatory Visit
Admission: RE | Admit: 2010-03-03 | Discharge: 2010-03-03 | Payer: Self-pay | Source: Home / Self Care | Attending: Internal Medicine | Admitting: Internal Medicine

## 2010-03-03 DIAGNOSIS — D472 Monoclonal gammopathy: Secondary | ICD-10-CM

## 2010-03-03 DIAGNOSIS — D1803 Hemangioma of intra-abdominal structures: Secondary | ICD-10-CM

## 2010-03-03 DIAGNOSIS — M62838 Other muscle spasm: Secondary | ICD-10-CM | POA: Insufficient documentation

## 2010-03-03 HISTORY — DX: Hemangioma of intra-abdominal structures: D18.03

## 2010-03-03 HISTORY — DX: Other muscle spasm: M62.838

## 2010-03-03 HISTORY — DX: Monoclonal gammopathy: D47.2

## 2010-03-05 ENCOUNTER — Other Ambulatory Visit: Payer: Self-pay | Admitting: Internal Medicine

## 2010-03-05 DIAGNOSIS — D1803 Hemangioma of intra-abdominal structures: Secondary | ICD-10-CM

## 2010-03-05 NOTE — Assessment & Plan Note (Signed)
Summary: 3 MO ROV/NWS  Medications Added CEFTIN 500 MG TABS (CEFUROXIME AXETIL) Take 1 tab by mouth twice a day        Vital Signs:  Patient Profile:   70 Years Old Female Weight:      134 pounds Temp:     99.7. degrees F oral Pulse rate:   79 / minute BP sitting:   145 / 84  (left arm)  Vitals Entered By: Tora Perches (January 23, 2007 2:19 PM)             Is Patient Diabetic? Yes      Chief Complaint:  Multiple medical problems or concerns.  History of Present Illness: The patient presents with complaints of sore throat, fever, cough, sinus congestion and drainge of several days duration. Not better with OTC meds. Chest hurts with coughing. Can't sleep due to cough. The mucus is colored.   Current Allergies: ! TYLENOL ! SULFA ! * IVP DYE ! FOSAMAX ! ASA ! * EVISTA ! * Z-PACK ! ULTRAM ! * VESICAR ! * TUSSINEX ! * OXYTROL  Past Medical History:    Depression    GERD    Hypothyroidism    Low back pain    Osteoarthritis, inflamatory   Family History:    Family History Hypertension  Social History:    Retired    Single    Never Smoked   Risk Factors:  Tobacco use:  never   Review of Systems       Worse OA   Physical Exam  General:     Well-developed,well-nourished,in no acute distress; alert,appropriate and cooperative throughout examination Eyes:     No corneal or conjunctival inflammation noted. EOMI. Perrla. Funduscopic exam benign, without hemorrhages, exudates or papilledema. Vision grossly normal. Nose:     White d/c Mouth:     Eryth throat Neck:     No deformities, masses, or tenderness noted. Lungs:     Normal respiratory effort, chest expands symmetrically. Lungs are clear to auscultation, no crackles or wheezes. Heart:     Normal rate and regular rhythm. S1 and S2 normal without gallop, murmur, click, rub or other extra sounds. Abdomen:     Bowel sounds positive,abdomen soft and non-tender without masses, organomegaly or  hernias noted. Msk:     R hand with OA def Neurologic:     No cranial nerve deficits noted. Station and gait are normal. Plantar reflexes are down-going bilaterally. DTRs are symmetrical throughout. Sensory, motor and coordinative functions appear intact.    Impression & Recommendations:  Problem # 1:  SINUSITIS, ACUTE (ICD-461.9)  Her updated medication list for this problem includes:    Ceftin 500 Mg Tabs (Cefuroxime axetil) .Marland Kitchen... Take 1 tab by mouth twice a day   Problem # 2:  OSTEOARTHRITIS (ICD-715.90)  The following medications were removed from the medication list:    Mobic 15 Mg Tabs (Meloxicam) ..... Once daily as needed    Indocin 25 Mg/108ml Susp (Indomethacin) .Marland Kitchen... As needed  Her updated medication list for this problem includes:    Darvocet-n 100 100-650 Mg Tabs (Propoxyphene n-apap) .Marland Kitchen... As needed   Problem # 3:  DEPRESSION (ICD-311)  Problem # 4:  LOW BACK PAIN (ICD-724.2)  The following medications were removed from the medication list:    Mobic 15 Mg Tabs (Meloxicam) ..... Once daily as needed    Indocin 25 Mg/59ml Susp (Indomethacin) .Marland Kitchen... As needed  Her updated medication list for this problem includes:  Darvocet-n 100 100-650 Mg Tabs (Propoxyphene n-apap) .Marland Kitchen... As needed   Complete Medication List: 1)  Synthroid 25 Mcg Tabs (Levothyroxine sodium) .... Once daily 2)  Actonel 35 Mg Tabs (Risedronate sodium) .... One weekly 3)  Protonix 40 Mg Pack (Pantoprazole sodium) .... Once daily 4)  Multivitamin  .... Once daily 5)  Calcium and Vitamin D 600mg   .... Two times a day 6)  Vitamin C 500mg   .... Three times a day 7)  Darvocet-n 100 100-650 Mg Tabs (Propoxyphene n-apap) .... As needed 8)  Flector 1.3 % Ptch (Diclofenac epolamine) .... Two times a day or as needed 9)  Ceftin 500 Mg Tabs (Cefuroxime axetil) .... Take 1 tab by mouth twice a day   Patient Instructions: 1)  Please schedule a follow-up appointment in 3 months.    Prescriptions:  CEFTIN 500 MG TABS (CEFUROXIME AXETIL) Take 1 tab by mouth twice a day  #20 x 0   Entered and Authorized by:   Tresa Garter MD   Signed by:   Tresa Garter MD on 01/23/2007   Method used:   Print then Give to Patient   RxID:   [id number]  ]

## 2010-03-05 NOTE — Progress Notes (Signed)
Summary: OV?   Phone Note Call from Patient Call back at [facility] Phone [phone]   Summary of Call: See previous phone note, patient stopped ranitidine w/no change in symptoms. She has office visit in July, do you want to see patient sooner?  Initial call taken by: Lamar Sprinkles, CMA,  July 16, 2009 9:56 AM  Follow-up for Phone Call        Noted Move up OV pls Follow-up by: Tresa Garter MD,  July 16, 2009 1:13 PM  Additional Follow-up for Phone Call Additional follow up Details #1::        Scheduled for tomorrow Additional Follow-up by: Lamar Sprinkles, CMA,  July 16, 2009 5:35 PM

## 2010-03-05 NOTE — Letter (Signed)
Summary: [facility]  [facility]   Imported By: Sherian Rein 11/13/2008 10:33:21  _____________________________________________________________________  External Attachment:    Type:   Image     Comment:   External Document

## 2010-03-05 NOTE — Progress Notes (Signed)
Summary: RESULTS  Phone Note Call from Patient Call back at Home Phone [phone]   Summary of Call: Patient is requesting results of labs. She continues to c/o fatigue.  Initial call taken by: Lamar Sprinkles, CMA,  July 07, 2009 10:28 AM  Follow-up for Phone Call        Labs OK  Potassium is a little elevated. Nothing in the labs that would explain tiredness Keep return office visit  Follow-up by: Tresa Garter MD,  July 07, 2009 1:09 PM  Additional Follow-up for Phone Call Additional follow up Details #1::        Pt informed, she has been on ranitidine for a long time but now she thinks it may cause this fatigue b/c it is listed on the side effects.  Additional Follow-up by: Lamar Sprinkles, CMA,  July 07, 2009 3:12 PM    Additional Follow-up for Phone Call Additional follow up Details #2::    I doubt; OK to hold it x 1 wk and see Follow-up by: Tresa Garter MD,  July 08, 2009 1:05 PM  Additional Follow-up for Phone Call Additional follow up Details #3:: Details for Additional Follow-up Action Taken: informed pt, she will call with an update after she is off of medication for a week. Additional Follow-up by: Ami Bullins CMA,  July 08, 2009 1:58 PM

## 2010-03-05 NOTE — Letter (Signed)
Summary: [facility]  [facility]   Imported By: Esmeralda Links D'jimraou 08/02/2007 12:24:39  _____________________________________________________________________  External Attachment:    Type:   Image     Comment:   External Document

## 2010-03-05 NOTE — Miscellaneous (Signed)
Summary: actonel   Clinical Lists Changes  Medications: Rx of ACTONEL 35 MG  TABS (RISEDRONATE SODIUM) one weekly;  #4 x 6;  Signed;  Entered by: Tora Perches;  Authorized by: Tresa Garter MD;  Method used: Electronic    Prescriptions: ACTONEL 35 MG  TABS (RISEDRONATE SODIUM) one weekly  #4 x 6   Entered by:   Tora Perches   Authorized by:   Tresa Garter MD   Signed by:   Tora Perches on 01/06/2007   Method used:   Electronically sent to ...       [facility]  [phone]*       [address]       [place], [place]  [address]       Ph: [phone] or [phone]       Fax: [phone]   RxID:   [id number]

## 2010-03-05 NOTE — Progress Notes (Signed)
Summary: Vimovo  Phone Note Call from Patient Call back at Home Phone [phone]   Summary of Call: Patient left message on triage that the prescription for Vimovo and card given at appt will not be honored. Patient has checked with her insurance and this is a non covered drug. Please advise.  FYI--I have not received prior authorization for this prescription. Initial call taken by: Lucious Groves,  July 24, 2009 10:21 AM  Follow-up for Phone Call        As I told the pt the card will only be honored if she DOES NOT file it w/her insurance: if she pays cash she will be charged $10 with the card Follow-up by: Tresa Garter MD,  July 24, 2009 12:41 PM  Additional Follow-up for Phone Call Additional follow up Details #1::         Patient notified and is adament that the card was incorrectly marketed to MD and she followed his instructions. Patient had approval from Vimovo until the went to the pharmacy, then Medicare came into play and she has been denied. I tried to make patient understand that it is pharmacy error and she is adament that it was not. I discussed the issue/process with patient for  25 minutes, and she has decided she will try a different pharmacy next week. Additional Follow-up by: Lucious Groves,  July 24, 2009 3:36 PM    Additional Follow-up for Phone Call Additional follow up Details #2::    She can go to a different drug store, pay cash for it and not file Medicare - it should work, other people do it. However, it is up to her. Follow-up by: Tresa Garter MD,  July 25, 2009 12:45 PM

## 2010-03-05 NOTE — Assessment & Plan Note (Signed)
Summary: fatigue/SD   Vital Signs:  Patient profile:   70 year old female Height:      64 inches (162.56 cm) Weight:      128.50 pounds (58.41 kg) BMI:     22.14 O2 Sat:      97 % on Room air Temp:     97.8 degrees F (36.56 degrees C) oral Pulse rate:   71 / minute BP sitting:   130 / 80  (left arm) Cuff size:   regular  Vitals Entered By: Lucious Groves (July 17, 2009 9:28 AM)  O2 Flow:  Room air CC: C/O fatigue for a few weeks and congestion./kb Is Patient Diabetic? No Pain Assessment Patient in pain? no      Comments Patient notes that she is producing white mucous and that she is not taking Allegra or Pennsaid./kb   CC:  C/O fatigue for a few weeks and congestion./kb.  History of Present Illness: C/o OA is worse. She has more numbness in L hand, dropped a roast recentely. C/o stiffness.  Current Medications (verified): 1)  Synthroid 25 Mcg  Tabs (Levothyroxine Sodium) .... Once Daily 2)  Vitamin D3 1000 Unit  Tabs (Cholecalciferol) .Marland Kitchen.. 1 By Mouth Daily 3)  Meloxicam 7.5 Mg  Tabs (Meloxicam) .... Once Daily 4)  Fluticasone Propionate 50 Mcg/act  Susp (Fluticasone Propionate) .... 2 Sprays Each Nostril Once Daily 5)  Ranitidine Hcl 150 Mg Caps (Ranitidine Hcl) .Marland Kitchen.. 1 Po Bid 6)  Robaxin 500 Mg Tabs (Methocarbamol) .Marland Kitchen.. 1 By Mouth Qid As Needed Spasm 7)  Glucosamine-Chondroitin  Caps (Glucosamine-Chondroit-Vit C-Mn) .... Once Daily 8)  Hydrocodone-Acetaminophen 5-325 Mg Tabs (Hydrocodone-Acetaminophen) .Marland Kitchen.. 1-2 By Mouth Two Times A Day As Needed Pain 9)  Claritin 10 Mg Tabs (Loratadine) .Marland Kitchen.. 1 By Mouth Qd  Allergies (verified): 1)  ! Sulfa 2)  ! * Ivp Dye 3)  ! Fosamax 4)  ! * Evista 5)  ! * Z-Pack 6)  ! Ultram 7)  ! * Vesicar 8)  ! * Oxytrol 9)  Tylenol  Past History:  Past Medical History: Last updated: 03/05/2009 Depression GERD  Dr Ewing Schlein Hypothyroidism Low back pain Dr Phoebe Perch Osteoarthritis, inflamatory Dr Kellie Simmering Allergic rhinitis ENT Dr  Trecia Rogers  Social History: Last updated: 01/23/2007 Retired Single Never Smoked  Review of Systems  The patient denies fever, dyspnea on exertion, and abdominal pain.    Physical Exam  General:  Well-developed,well-nourished,in no acute distress; alert,appropriate and cooperative throughout examination Mouth:  Erythematous throat mucosa and intranasal erythema. A cold sore on upper lip  Lungs:  Normal respiratory effort, chest expands symmetrically. Lungs are clear to auscultation, no crackles or wheezes. Heart:  Normal rate and regular rhythm. S1 and S2 normal without gallop, murmur, click, rub or other extra sounds. Abdomen:  Sensitive in epig area Msk:  B calves are NT B hands w/severe OA deformities Lumbar-sacral spine is tender to palpation over paraspinal muscles and painfull with the ROM  Neurologic:  alert & oriented X3.   Skin:  No rash   Impression & Recommendations:  Problem # 1:  OSTEOARTHRITIS (ICD-715.90) Assessment Deteriorated  The following medications were removed from the medication list:    Meloxicam 7.5 Mg Tabs (Meloxicam) ..... Once daily Her updated medication list for this problem includes:    Hydrocodone-acetaminophen 5-325 Mg Tabs (Hydrocodone-acetaminophen) .Marland Kitchen... 1-2 by mouth two times a day as needed pain    Vimovo 500-20 Mg Tbec (Naproxen-esomeprazole) .Marland Kitchen... 1 by mouth once daily - two times a  day pc as needed pain  Problem # 2:  HYPOTHYROIDISM (ICD-244.9) Assessment: Unchanged  Her updated medication list for this problem includes:    Synthroid 25 Mcg Tabs (Levothyroxine sodium) ..... Once daily  Problem # 3:  DEPRESSION (ICD-311) Assessment: Unchanged  Problem # 4:  PARESTHESIA (ICD-782.0) Assessment: Unchanged  Complete Medication List: 1)  Synthroid 25 Mcg Tabs (Levothyroxine sodium) .... Once daily 2)  Vitamin D3 1000 Unit Tabs (Cholecalciferol) .Marland Kitchen.. 1 by mouth daily 3)  Fluticasone Propionate 50 Mcg/act Susp (Fluticasone  propionate) .... 2 sprays each nostril once daily 4)  Ranitidine Hcl 150 Mg Caps (Ranitidine hcl) .Marland Kitchen.. 1 po bid 5)  Robaxin 500 Mg Tabs (Methocarbamol) .Marland Kitchen.. 1 by mouth qid as needed spasm 6)  Glucosamine-chondroitin Caps (Glucosamine-chondroit-vit c-mn) .... Once daily 7)  Hydrocodone-acetaminophen 5-325 Mg Tabs (Hydrocodone-acetaminophen) .Marland Kitchen.. 1-2 by mouth two times a day as needed pain 8)  Claritin 10 Mg Tabs (Loratadine) .Marland Kitchen.. 1 by mouth qd 9)  Vimovo 500-20 Mg Tbec (Naproxen-esomeprazole) .Marland Kitchen.. 1 by mouth once daily - two times a day pc as needed pain 10)  Cetirizine Hcl 10 Mg Tabs (Cetirizine hcl) .Marland Kitchen.. 1 by mouth once daily for allergies  Patient Instructions: 1)  Please schedule a follow-up appointment in 3 months. 2)  BMP prior to visit, ICD-9: 3)  Hepatic Panel prior to visit, ICD-9: 4)  ESR  995.20 5)  Vit B12 782.0 Prescriptions: CETIRIZINE HCL 10 MG TABS (CETIRIZINE HCL) 1 by mouth once daily for allergies  #30 x 12   Entered and Authorized by:   Tresa Garter MD   Signed by:   Tresa Garter MD on 07/17/2009   Method used:   Print then Give to Patient   RxID:   [id number] VIMOVO 500-20 MG TBEC (NAPROXEN-ESOMEPRAZOLE) 1 by mouth once daily - two times a day pc as needed pain  #60 x 6   Entered and Authorized by:   Tresa Garter MD   Signed by:   Tresa Garter MD on 07/17/2009   Method used:   Print then Give to Patient   RxID:   [id number]

## 2010-03-05 NOTE — Miscellaneous (Signed)
Summary: SYNTHROID   Clinical Lists Changes  Medications: Rx of SYNTHROID 25 MCG  TABS (LEVOTHYROXINE SODIUM) once daily;  #30 x 6;  Signed;  Entered by: Orlan Leavens;  Authorized by: Tresa Garter MD;  Method used[facility]  [phone]*, [address] [place], [place]  [address], Ph: [phone] or [phone], Fax: [phone]    Prescriptions: SYNTHROID 25 MCG  TABS (LEVOTHYROXINE SODIUM) once daily  #30 x 6   Entered by:   Orlan Leavens   Authorized by:   Tresa Garter MD   Signed by:   Orlan Leavens on 07/07/2007   Method used:   Faxed to ...       [facility]  [phone]*       [address]       [place], [place]  [address]       Ph: [phone] or [phone]       Fax: [phone]   RxID:   [id number]

## 2010-03-05 NOTE — Progress Notes (Signed)
Summary: Req for different rx  Phone Note Call from Patient   Summary of Call: Pt thinks she "needs something stronger". She started zpak tuesday but feels worse. C/o productive cough with brown-green sputum. She has not taken any otc meds.   Initial call taken by: Lamar Sprinkles, CMA,  November 21, 2008 9:43 AM  Follow-up for Phone Call        OK Augmentin Follow-up by: Tresa Garter MD,  November 21, 2008 1:02 PM  Additional Follow-up for Phone Call Additional follow up Details #1::        Pt informed  Additional Follow-up by: Lamar Sprinkles, CMA,  November 21, 2008 1:54 PM    New/Updated Medications: AUGMENTIN 875-125 MG TABS (AMOXICILLIN-POT CLAVULANATE) 1 by mouth 2 times daily Prescriptions: AUGMENTIN 875-125 MG TABS (AMOXICILLIN-POT CLAVULANATE) 1 by mouth 2 times daily  #20 x 3   Entered and Authorized by:   Tresa Garter MD   Signed by:   Lamar Sprinkles, CMA on 11/21/2008   Method used:   Electronically to        [facility]  [phone]* (retail)       [address] [place], [place]  [address]       Ph: [phone] or [phone]       Fax: [phone]   RxID:   [id number]

## 2010-03-05 NOTE — Consult Note (Signed)
Summary: [facility]  [facility]   Imported By: Esmeralda Links D'jimraou 08/25/2007 14:55:30  _____________________________________________________________________  External Attachment:    Type:   Image     Comment:   External Document

## 2010-03-05 NOTE — Letter (Signed)
Summary: [facility]  [facility]   Imported By: Sherian Rein 06/19/2009 09:56:23  _____________________________________________________________________  External Attachment:    Type:   Image     Comment:   External Document

## 2010-03-05 NOTE — Letter (Signed)
Summary: [facility]  [facility]   Imported By: Lester Richwood 06/14/2008 08:16:21  _____________________________________________________________________  External Attachment:    Type:   Image     Comment:   External Document

## 2010-03-05 NOTE — Consult Note (Signed)
Summary: [facility]  [facility]   Imported By: Esmeralda Links D'jimraou 11/06/2007 12:43:59  _____________________________________________________________________  External Attachment:    Type:   Image     Comment:   External Document

## 2010-03-05 NOTE — Letter (Signed)
Summary: [facility] [place]  [facility]   Imported By: Sherian Rein 07/03/2009 12:19:50  _____________________________________________________________________  External Attachment:    Type:   Image     Comment:   External Document

## 2010-03-05 NOTE — Letter (Signed)
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

## 2010-03-05 NOTE — Assessment & Plan Note (Signed)
Summary: 3 MOROV /NWS $50   Vital Signs:  Patient profile:   70 year old female Height:      64 inches Weight:      131 pounds BMI:     22.57 Temp:     97.4 degrees F oral Pulse rate:   75 / minute BP sitting:   96 / 74  (left arm)  Vitals Entered By: Tora Perches (August 14, 2008 12:03 PM) CC: f/u Is Patient Diabetic? No   CC:  f/u.  History of Present Illness: The patient presents for a follow up of OA and back pain, anxiety, depression.   Current Medications (verified): 1)  Synthroid 25 Mcg  Tabs (Levothyroxine Sodium) .... Once Daily 2)  Darvocet-N 100 100-650 Mg  Tabs (Propoxyphene N-Apap) .... As Needed 3)  Vitamin D3 1000 Unit  Tabs (Cholecalciferol) .Marland Kitchen.. 1 By Mouth Daily 4)  Meloxicam 7.5 Mg  Tabs (Meloxicam) .... Once Daily 5)  Ranitidine Hcl 300 Mg Tabs (Ranitidine Hcl) .Marland Kitchen.. 1 Po Qd 6)  Cvs Vitamin B12 1000 Mcg Tabs (Cyanocobalamin) .... Once Daily 7)  Fluticasone Propionate 50 Mcg/act  Susp (Fluticasone Propionate) .... 2 Sprays Each Nostril Once Daily  Allergies: 1)  ! Sulfa 2)  ! * Ivp Dye 3)  ! Fosamax 4)  ! * Evista 5)  ! * Z-Pack 6)  ! Ultram 7)  ! * Vesicar 8)  ! * Tussinex 9)  ! * Oxytrol 10)  Tylenol  Past History:  Past Medical History: Last updated: 08/02/2007 Depression GERD  Dr Ewing Schlein Hypothyroidism Low back pain Dr Phoebe Perch Osteoarthritis, inflamatory Allergic rhinitis ENT Dr Gerilyn Pilgrim  Past Surgical History: Last updated: 08/29/2006 Lumbar laminectomy L3-4, 4-5 and L5-S1  Family History: Last updated: 01/23/2007 Family History Hypertension  Social History: Last updated: 01/23/2007 Retired Single Never Smoked  Review of Systems  The patient denies fever, weight gain, chest pain, and abdominal pain.         LBP  Physical Exam  General:  Well-developed,well-nourished,in no acute distress; alert,appropriate and cooperative throughout examination Nose:  External nasal examination shows no deformity or inflammation. Nasal  mucosa are pink and moist without lesions or exudates. Mouth:  Oral mucosa and oropharynx without lesions or exudates.  Teeth in good repair. Neck:  No deformities, masses, or tenderness noted. Lungs:  Normal respiratory effort, chest expands symmetrically. Lungs are clear to auscultation, no crackles or wheezes. Heart:  Normal rate and regular rhythm. S1 and S2 normal without gallop, murmur, click, rub or other extra sounds. Abdomen:  Sensitive in epig area Msk:  No deformity or scoliosis noted of thoracic or lumbar spine.  Lumbar-sacral spine is tender to palpation over paraspinal muscles and painfull with the ROM Hand joints with deformities, painfull Extremities:  No clubbing, cyanosis, edema, or deformity noted with normal full range of motion of all joints.   Neurologic:  No cranial nerve deficits noted. Station and gait are normal. Plantar reflexes are down-going bilaterally. DTRs are symmetrical throughout. Sensory, motor and coordinative functions appear intact. Skin:  Intact without suspicious lesions or rashes Psych:  normally interactive, good eye contact, and A little depressed affect.     Impression & Recommendations:  Problem # 1:  LOW BACK PAIN (ICD-724.2) Assessment Unchanged Starting PT The following medications were removed from the medication list:    Hydrocodone-acetaminophen 10-325 Mg Tabs (Hydrocodone-acetaminophen) .Marland Kitchen... 1 by mouth up to 4 time per day as needed for pain Her updated medication list for this problem includes:  Darvocet-n 100 100-650 Mg Tabs (Propoxyphene n-apap) .Marland Kitchen... As needed    Meloxicam 7.5 Mg Tabs (Meloxicam) ..... Once daily    Robaxin 500 Mg Tabs (Methocarbamol) .Marland Kitchen... 1 by mouth qid as needed spasm  Problem # 2:  OSTEOARTHRITIS (ICD-715.90) Assessment: Unchanged  The following medications were removed from the medication list:    Hydrocodone-acetaminophen 10-325 Mg Tabs (Hydrocodone-acetaminophen) .Marland Kitchen... 1 by mouth up to 4 time per day as  needed for pain Her updated medication list for this problem includes:    Darvocet-n 100 100-650 Mg Tabs (Propoxyphene n-apap) .Marland Kitchen... As needed    Meloxicam 7.5 Mg Tabs (Meloxicam) ..... Once daily  Problem # 3:  HYPOTHYROIDISM (ICD-244.9)  Her updated medication list for this problem includes:    Synthroid 25 Mcg Tabs (Levothyroxine sodium) ..... Once daily  Problem # 4:  DEPRESSION (ICD-311) Assessment: Unchanged  Problem # 5:  GERD (ICD-530.81) Assessment: Comment Only  Her updated medication list for this problem includes:    Ranitidine Hcl 300 Mg Tabs (Ranitidine hcl) .Marland Kitchen... 1 po qd    Ranitidine Hcl 150 Mg Caps (Ranitidine hcl) .Marland Kitchen... 1 po bid  Complete Medication List: 1)  Synthroid 25 Mcg Tabs (Levothyroxine sodium) .... Once daily 2)  Darvocet-n 100 100-650 Mg Tabs (Propoxyphene n-apap) .... As needed 3)  Vitamin D3 1000 Unit Tabs (Cholecalciferol) .Marland Kitchen.. 1 by mouth daily 4)  Meloxicam 7.5 Mg Tabs (Meloxicam) .... Once daily 5)  Ranitidine Hcl 300 Mg Tabs (Ranitidine hcl) .Marland Kitchen.. 1 po qd 6)  Cvs Vitamin B12 1000 Mcg Tabs (Cyanocobalamin) .... Once daily 7)  Fluticasone Propionate 50 Mcg/act Susp (Fluticasone propionate) .... 2 sprays each nostril once daily 8)  Ranitidine Hcl 150 Mg Caps (Ranitidine hcl) .Marland Kitchen.. 1 po bid 9)  Robaxin 500 Mg Tabs (Methocarbamol) .Marland Kitchen.. 1 by mouth qid as needed spasm  Patient Instructions: 1)  Try to eat more raw plant food, fresh and dry fruit, raw almonds, leafy vegies, whole foods and less red meat, less animal fat. Avoid processed foods (canned soups, hot dogs, sausage , frozen dinners). Avoid corn syrup or aspartam and Splenda  containing drinks. Make your own salad dressing with olive oil, wine vinegar, garlic etc. 2)  Please schedule a follow-up appointment in 3 months. 3)  BMP prior to visit, ICD-9: 4)  Hepatic Panel prior to visit, ICD-9: 5)  TSH prior to visit, ICD-9: 6)  Urine-dip prior to visit, ICD-9:401.1 995.20 7)  Vit D 8)  Start  taking a chair yoga class or Tai-Chi   Prescriptions: DARVOCET-N 100 100-650 MG  TABS (PROPOXYPHENE N-APAP) as needed  #120 x 2   Entered and Authorized by:   Tresa Garter MD   Signed by:   Tresa Garter MD on 08/14/2008   Method used:   Print then Give to Patient   RxID:   [id number] RANITIDINE HCL 150 MG CAPS (RANITIDINE HCL) 1 po bid  #60 x 12   Entered and Authorized by:   Tresa Garter MD   Signed by:   Tresa Garter MD on 08/14/2008   Method used:   Print then Give to Patient   RxID:   [id number]

## 2010-03-05 NOTE — Assessment & Plan Note (Signed)
Summary: 3 MO ROV/NWS   Vital Signs:  Patient Profile:   70 Years Old Female Weight:      133 pounds Temp:     98. degrees F oral Pulse rate:   70 / minute BP sitting:   131 / 75  (left arm)  Vitals Entered By: Tora Perches (April 25, 2007 10:40 AM)             Is Patient Diabetic? No     Chief Complaint:  Multiple medical problems or concerns.  History of Present Illness: C/o voice loss off and on x weeks. Had some ST. C/o GERD, allergies C/o pain in in B forearms after she takes actonel    Current Allergies (reviewed today): ! TYLENOL ! SULFA ! * IVP DYE ! FOSAMAX ! * EVISTA ! * Z-PACK ! ULTRAM ! * VESICAR ! * TUSSINEX ! * OXYTROL  Past Medical History:    Reviewed history from 01/23/2007 and no changes required:       Depression       GERD       Hypothyroidism       Low back pain       Osteoarthritis, inflamatory       Allergic rhinitis       ENT Dr Gerilyn Pilgrim   Family History:    Reviewed history from 01/23/2007 and no changes required:       Family History Hypertension  Social History:    Reviewed history from 01/23/2007 and no changes required:       Retired       Single       Never Smoked    Review of Systems       The patient complains of hoarseness.  The patient denies anorexia, fever, chest pain, syncope, and hemoptysis.     Physical Exam  General:     Well-developed,well-nourished,in no acute distress; alert,appropriate and cooperative throughout examination Head:     Normocephalic and atraumatic without obvious abnormalities. No apparent alopecia or balding. Ears:     External ear exam shows no significant lesions or deformities.  Otoscopic examination reveals clear canals, tympanic membranes are intact bilaterally without bulging, retraction, inflammation or discharge. Hearing is grossly normal bilaterally. Nose:     External nasal examination shows no deformity or inflammation. Nasal mucosa are pink and moist without lesions or  exudates. Mouth:     Oral mucosa and oropharynx without lesions or exudates.  Teeth in good repair. Neck:     No deformities, masses, or tenderness noted. Lungs:     Normal respiratory effort, chest expands symmetrically. Lungs are clear to auscultation, no crackles or wheezes. Heart:     Normal rate and regular rhythm. S1 and S2 normal without gallop, murmur, click, rub or other extra sounds. Abdomen:     Bowel sounds positive,abdomen soft and non-tender without masses, organomegaly or hernias noted. Msk:     No deformity or scoliosis noted of thoracic or lumbar spine.   Forearms NT B Neurologic:     No cranial nerve deficits noted. Station and gait are normal. Plantar reflexes are down-going bilaterally. DTRs are symmetrical throughout. Sensory, motor and coordinative functions appear intact.    Impression & Recommendations:  Problem # 1:  HOARSENESS (ZOX-096.04) Assessment: New Poss. related to GERD. D/c vitamins, Actonel. Treat #2. ENT consult if not better.  Problem # 2:  ALLERGIC RHINITIS (ICD-477.9) Assessment: New Loratidine prn Her updated medication list for this problem includes:  Loratadine 10 Mg Tabs (Loratadine) ..... Once daily as needed allergies   Problem # 3:  HAND PAIN, RIGHT (ICD-729.5) and LEFT Poss. due to Actonel -  hold it x 1-3 mo  Problem # 4:  OSTEOARTHRITIS (ICD-715.90) Assessment: Unchanged  Her updated medication list for this problem includes:    Darvocet-n 100 100-650 Mg Tabs (Propoxyphene n-apap) .Marland Kitchen... As needed   Complete Medication List: 1)  Synthroid 25 Mcg Tabs (Levothyroxine sodium) .... Once daily 2)  Actonel 35 Mg Tabs (Risedronate sodium) .... One weekly 3)  Protonix 40 Mg Pack (Pantoprazole sodium) .... Once daily 4)  Darvocet-n 100 100-650 Mg Tabs (Propoxyphene n-apap) .... As needed 5)  Flector 1.3 % Ptch (Diclofenac epolamine) .... Two times a day or as needed 6)  Vitamin D3 1000 Unit Tabs (Cholecalciferol) .Marland Kitchen.. 1 by mouth  daily 7)  Loratadine 10 Mg Tabs (Loratadine) .... Once daily as needed allergies   Patient Instructions: 1)  Please schedule a follow-up appointment in 3 months.    Prescriptions: DARVOCET-N 100 100-650 MG  TABS (PROPOXYPHENE N-APAP) as needed  #120 x 2   Entered and Authorized by:   Tresa Garter MD   Signed by:   Tresa Garter MD on 04/25/2007   Method used:   Print then Give to Patient   RxID:   [id number] LORATADINE 10 MG  TABS (LORATADINE) once daily as needed allergies  #30 x 12   Entered and Authorized by:   Tresa Garter MD   Signed by:   Tresa Garter MD on 04/25/2007   Method used:   Print then Give to Patient   RxID:   [id number]  ]

## 2010-03-05 NOTE — Letter (Signed)
Summary: [facility]  [facility]   Imported By: Lester [place] 04/23/2008 11:49:59  _____________________________________________________________________  External Attachment:    Type:   Image     Comment:   External Document

## 2010-03-05 NOTE — Letter (Signed)
Summary: [facility]  [facility]   Imported By: Lester [place] 04/17/2008 08:50:55  _____________________________________________________________________  External Attachment:    Type:   Image     Comment:   External Document

## 2010-03-05 NOTE — Consult Note (Signed)
Summary: [facility]  [facility]   Imported By: Esmeralda Links D'jimraou 08/10/2007 11:16:50  _____________________________________________________________________  External Attachment:    Type:   Image     Comment:   External Document

## 2010-03-05 NOTE — Progress Notes (Signed)
  Phone Note Call from Patient   Caller: Patient Summary of Call: PT complains of pain in her left leg. She states that the pain started about 4 inches above her ankle and have moved up her leg. Pt states she does not feel any fever in her leg. But she did mention that she has a history of phlebitis. I have sch pt for appt to see Dr Macario Golds tomm. morning at 9:15. OK? Initial call taken by: Ami Bullins CMA,  October 30, 2008 4:13 PM  Follow-up for Phone Call        OK Thx Follow-up by: Tresa Garter MD,  October 30, 2008 5:36 PM

## 2010-03-05 NOTE — Assessment & Plan Note (Signed)
Summary: continued sinus problems SD   Vital Signs:  Patient profile:   70 year old female Weight:      133 pounds Temp:     97.5 degrees F oral Pulse rate:   79 / minute BP sitting:   132 / 80  (left arm)  Vitals Entered By: Tora Perches (November 29, 2008 1:28 PM) CC: ongoing sinus problem Is Patient Diabetic? No   CC:  ongoing sinus problem.  History of Present Illness: F/u URI, some better  Preventive Screening-Counseling & Management  Alcohol-Tobacco     Smoking Status: never  Allergies: 1)  ! Sulfa 2)  ! * Ivp Dye 3)  ! Fosamax 4)  ! * Evista 5)  ! * Z-Pack 6)  ! Ultram 7)  ! * Vesicar 8)  ! * Tussinex 9)  ! * Oxytrol 10)  ! Hydrocodone-Acetaminophen (Hydrocodone-Acetaminophen) 11)  Tylenol  Past History:  Past Medical History: Last updated: 11/19/2008 Depression GERD  Dr Ewing Schlein Hypothyroidism Low back pain Dr Phoebe Perch Osteoarthritis, inflamatory Allergic rhinitis ENT Dr Trecia Rogers  Physical Exam  General:  Well-developed,well-nourished,in no acute distress; alert,appropriate and cooperative throughout examination Mouth:  Erythematous throat mucosa and intranasal erythema. A cold sore on upper lip  Lungs:  Normal respiratory effort, chest expands symmetrically. Lungs are clear to auscultation, no crackles or wheezes. Heart:  Normal rate and regular rhythm. S1 and S2 normal without gallop, murmur, click, rub or other extra sounds. Skin:  No rash Psych:  normally interactive, good eye contact, and A little depressed affect.     Impression & Recommendations:  Problem # 1:  SINUSITIS, ACUTE (ICD-461.9) Assessment Improved  Her updated medication list for this problem includes:    Fluticasone Propionate 50 Mcg/act Susp (Fluticasone propionate) .Marland Kitchen... 2 sprays each nostril once daily    Claritin-d 24 Hour 10-240 Mg Xr24h-tab (Loratadine-pseudoephedrine) ..... Once daily  Problem # 2:  ALLERGIC RHINITIS (ICD-477.9) Assessment: Deteriorated   The following medications were removed from the medication list:    Zyrtec Allergy 10 Mg Tabs (Cetirizine hcl) ..... Once daily Her updated medication list for this problem includes:    Fluticasone Propionate 50 Mcg/act Susp (Fluticasone propionate) .Marland Kitchen... 2 sprays each nostril once daily Take 40mg  qd for 3 days, then 20 mg qd for 3 days, then 10mg  qd for 6 days, then stop. Take pc.   Problem # 3:  Cold sore Assessment: New Acyclovir prn  Complete Medication List: 1)  Synthroid 25 Mcg Tabs (Levothyroxine sodium) .... Once daily 2)  Darvocet-n 100 100-650 Mg Tabs (Propoxyphene n-apap) .... As needed 3)  Vitamin D3 1000 Unit Tabs (Cholecalciferol) .Marland Kitchen.. 1 by mouth daily 4)  Meloxicam 7.5 Mg Tabs (Meloxicam) .... Once daily 5)  Cvs Vitamin B12 1000 Mcg Tabs (Cyanocobalamin) .... Once daily 6)  Fluticasone Propionate 50 Mcg/act Susp (Fluticasone propionate) .... 2 sprays each nostril once daily 7)  Ranitidine Hcl 150 Mg Caps (Ranitidine hcl) .Marland Kitchen.. 1 po bid 8)  Robaxin 500 Mg Tabs (Methocarbamol) .Marland Kitchen.. 1 by mouth qid as needed spasm 9)  Glucosamine-chondroitin Caps (Glucosamine-chondroit-vit c-mn) .... Once daily 10)  Colace 50 Mg Caps (Docusate sodium) .... Once daily 11)  Claritin-d 24 Hour 10-240 Mg Xr24h-tab (Loratadine-pseudoephedrine) .... Once daily 12)  Prednisone 10 Mg Tabs (Prednisone) .... Take 40mg  qd for 3 days, then 20 mg qd for 3 days, then 10mg  qd for 6 days, then stop. take pc. 13)  Zovirax 400 Mg Tabs (Acyclovir) .Marland Kitchen.. 1 by mouth three times a day  for cold sore  Patient Instructions: 1)  Call if you are not better in a reasonable amount of time or if worse.  Prescriptions: ZOVIRAX 400 MG TABS (ACYCLOVIR) 1 by mouth three times a day for cold sore  #21 x 1   Entered and Authorized by:   Tresa Garter MD   Signed by:   Tresa Garter MD on 11/29/2008   Method used:   Print then Give to Patient   RxID:   [id number] PREDNISONE 10 MG  TABS (PREDNISONE) Take 40mg   qd for 3 days, then 20 mg qd for 3 days, then 10mg  qd for 6 days, then stop. Take pc.  #24 x 0   Entered and Authorized by:   Tresa Garter MD   Signed by:   Tresa Garter MD on 11/29/2008   Method used:   Electronically to        [id number] (retail)       [address] [place], [place]  [address]       Ph: [phone] or [phone]       Fax: [phone]   RxID:   [id number]

## 2010-03-05 NOTE — Letter (Signed)
Summary: [facility]  [facility]   Imported By: Sherian Rein 09/02/2009 13:27:55  _____________________________________________________________________  External Attachment:    Type:   Image     Comment:   External Document

## 2010-03-05 NOTE — Assessment & Plan Note (Signed)
Summary: 3 MO ROV /NWS  #   Vital Signs:  Patient profile:   70 year old female Height:      64 inches Weight:      131 pounds BMI:     22.57 Temp:     97.9 degrees F oral Pulse rate:   84 / minute Pulse rhythm:   regular Resp:     16 per minute BP sitting:   132 / 86  (left arm) Cuff size:   regular  Vitals Entered By: Lanier Prude, CMA(AAMA) (December 01, 2009 1:19 PM) CC: f/u c/ orash on roof of mouth X 1 day, sore throat Comments pt is not taking ranitidine, robaxin, hydroco-acetami, claritin, Vimovo   CC:  f/u c/ orash on roof of mouth X 1 day and sore throat.  History of Present Illness: C/o rash in the mouth - painful F/u LBP, neck pain, OA - hands are more swollen  Current Medications (verified): 1)  Synthroid 25 Mcg  Tabs (Levothyroxine Sodium) .... Once Daily 2)  Vitamin D3 1000 Unit  Tabs (Cholecalciferol) .Marland Kitchen.. 1 By Mouth Daily 3)  Fluticasone Propionate 50 Mcg/act  Susp (Fluticasone Propionate) .... 2 Sprays Each Nostril Once Daily 4)  Ranitidine Hcl 150 Mg Caps (Ranitidine Hcl) .Marland Kitchen.. 1 Po Bid 5)  Robaxin 500 Mg Tabs (Methocarbamol) .Marland Kitchen.. 1 By Mouth Qid As Needed Spasm 6)  Glucosamine-Chondroitin  Caps (Glucosamine-Chondroit-Vit C-Mn) .... Once Daily 7)  Hydrocodone-Acetaminophen 5-325 Mg Tabs (Hydrocodone-Acetaminophen) .Marland Kitchen.. 1-2 By Mouth Two Times A Day As Needed Pain 8)  Claritin 10 Mg Tabs (Loratadine) .Marland Kitchen.. 1 By Mouth Qd 9)  Vimovo 500-20 Mg Tbec (Naproxen-Esomeprazole) .Marland Kitchen.. 1 By Mouth Once Daily - Two Times A Day Pc As Needed Pain 10)  Cetirizine Hcl 10 Mg Tabs (Cetirizine Hcl) .Marland Kitchen.. 1 By Mouth Once Daily For Allergies 11)  Mobic 7.5 Mg Tabs (Meloxicam) .Marland Kitchen.. 1 Once Daily 12)  Prilosec 20 Mg Cpdr (Omeprazole) .Marland Kitchen.. 1 By Mouth Once Daily 13)  Allergy Injections  Allergies (verified): 1)  ! Sulfa 2)  ! * Ivp Dye 3)  ! Fosamax 4)  ! * Evista 5)  ! * Z-Pack 6)  ! Ultram 7)  ! * Vesicar 8)  ! * Oxytrol 9)  Tylenol  Past History:  Past Medical  History: Last updated: 03/05/2009 Depression GERD  Dr Ewing Schlein Hypothyroidism Low back pain Dr Phoebe Perch Osteoarthritis, inflamatory Dr Kellie Simmering Allergic rhinitis ENT Dr Trecia Rogers  Social History: Last updated: 01/23/2007 Retired Single Never Smoked  Review of Systems  The patient denies fever, chest pain, and dyspnea on exertion.    Physical Exam  General:  Well-developed,well-nourished,in no acute distress; alert,appropriate and cooperative throughout examination Head:  Normocephalic and atraumatic without obvious abnormalities. No apparent alopecia or balding. Ears:  External ear exam shows no significant lesions or deformities.  Otoscopic examination reveals clear canals, tympanic membranes are intact bilaterally without bulging, retraction, inflammation or discharge. Hearing is grossly normal bilaterally. Nose:  blister on upper lip and possible on R nose Mouth:  as above Neck:  No deformities, masses, or tenderness noted. Lungs:  Normal respiratory effort, chest expands symmetrically. Lungs are clear to auscultation, no crackles or wheezes. Heart:  Normal rate and regular rhythm. S1 and S2 normal without gallop, murmur, click, rub or other extra sounds. Abdomen:  Sensitive in epig area Msk:  B calves are NT B hands w/severe OA deformities Lumbar-sacral spine is tender to palpation over paraspinal muscles and painfull with the ROM  Extremities:  L dist calf is a little tender in a 4 cm area w/o redness or swelling No edema B Neurologic:  alert & oriented X3.   Skin:  No rash Psych:  normally interactive, good eye contact, and A little depressed affect.     Impression & Recommendations:  Problem # 1:  OSTEOARTHRITIS (ICD-715.90) Assessment Deteriorated  The following medications were removed from the medication list:    Vimovo 500-20 Mg Tbec (Naproxen-esomeprazole) .Marland Kitchen... 1 by mouth once daily - two times a day pc as needed pain Her updated medication list for this  problem includes:    Hydrocodone-acetaminophen 5-325 Mg Tabs (Hydrocodone-acetaminophen) .Marland Kitchen... 1-2 by mouth two times a day as needed pain    Mobic 7.5 Mg Tabs (Meloxicam) .Marland Kitchen... 1 once daily -hold. Take Prednisone 40mg  qd for 3 days, then 20 mg qd for 3 days, then 10mg  qd for 6 days, then stop. Take pc.   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 Once daily  Problem # 4:  DEPRESSION (ICD-311) Assessment: Unchanged  Complete Medication List: 1)  Synthroid 25 Mcg Tabs (Levothyroxine sodium) .... Once daily 2)  Vitamin D3 1000 Unit Tabs (Cholecalciferol) .Marland Kitchen.. 1 by mouth daily 3)  Fluticasone Propionate 50 Mcg/act Susp (Fluticasone propionate) .... 2 sprays each nostril once daily 4)  Glucosamine-chondroitin Caps (Glucosamine-chondroit-vit c-mn) .... Once daily 5)  Hydrocodone-acetaminophen 5-325 Mg Tabs (Hydrocodone-acetaminophen) .Marland Kitchen.. 1-2 by mouth two times a day as needed pain 6)  Cetirizine Hcl 10 Mg Tabs (Cetirizine hcl) .Marland Kitchen.. 1 by mouth once daily for allergies 7)  Mobic 7.5 Mg Tabs (Meloxicam) .Marland Kitchen.. 1 once daily 8)  Prilosec 20 Mg Cpdr (Omeprazole) .Marland Kitchen.. 1 by mouth once daily 9)  Allergy Injections  10)  Acyclovir 400 Mg Tabs (Acyclovir) .Marland Kitchen.. 1 by mouth three times a day as needed herpes x 7 d 11)  Prednisone 10 Mg Tabs (Prednisone) .... Take 40mg  qd for 3 days, then 20 mg qd for 3 days, then 10mg  qd for 6 days, then stop. take pc.  Other Orders: Flu Vaccine 12yrs + MEDICARE PATIENTS (Z6109) Administration Flu vaccine - MCR (U0454)  Patient Instructions: 1)  Please schedule a follow-up appointment in 3 months well w/labs 2)  and SPEP 729.5. Prescriptions: PREDNISONE 10 MG TABS (PREDNISONE) Take 40mg  qd for 3 days, then 20 mg qd for 3 days, then 10mg  qd for 6 days, then stop. Take pc.   #24 x 1   Entered and Authorized by:   Tresa Garter MD   Signed by:   Tresa Garter MD on 12/01/2009   Method used:   Print then Give to Patient   RxID:   [id number] ACYCLOVIR 400 MG TABS (ACYCLOVIR) 1 by mouth three times a day as needed herpes x 7 d  #21 x 1   Entered and Authorized by:   Tresa Garter MD   Signed by:   Tresa Garter MD on 12/01/2009   Method used:   Print then Give to Patient   RxID:   [id number] HYDROCODONE-ACETAMINOPHEN 5-325 MG TABS (HYDROCODONE-ACETAMINOPHEN) 1-2 by mouth two times a day as needed pain  #60 x 2   Entered and Authorized by:   Tresa Garter MD   Signed by:   Tresa Garter MD on 12/01/2009   Method used:  Print then Give to Patient   RxID:   [id number]    Orders Added: 1)  Est. Patient Level IV [14782] 2)  Flu Vaccine 13yrs + MEDICARE PATIENTS [Q2039] 3)  Administration Flu vaccine - MCR [G0008]    Influenza Vaccine (to be given today)        .[email or web address]   Flu Vaccine Consent Questions     Do you have a history of severe allergic reactions to this vaccine? no    Any prior history of allergic reactions to egg and/or gelatin? no    Do you have a sensitivity to the preservative Thimersol? no    Do you have a past history of Guillan-Barre Syndrome? no    Do you currently have an acute febrile illness? no    Have you ever had a severe reaction to latex? no    Vaccine information given and explained to patient? yes    Are you currently pregnant? no    Lot Number:[id number]   Exp Date:08/01/2010   Site Given  Left Deltoid IM Lanier Prude, [facility])  December 01, 2009 1:43 PM

## 2010-03-05 NOTE — Letter (Signed)
Summary: [facility]  [facility]   Imported By: Sherian Rein 06/28/2008 10:06:41  _____________________________________________________________________  External Attachment:    Type:   Image     Comment:   External Document

## 2010-03-05 NOTE — Progress Notes (Signed)
Summary: Fatigue  Phone Note Call from Patient   Summary of Call: Pt c/o increase in fatigue lately. She wants to know if labs for her "blood" disorder will be checked during lab visit in July? Please advise.  Initial call taken by: Lamar Sprinkles, CMA,  Jun 25, 2009 10:21 AM  Follow-up for Phone Call        The labs were nl in 2/2 We can check ESR, CBC, TSH, UA, BMET 780.79  995.20 Follow-up by: Tresa Garter MD,  Jun 25, 2009 12:57 PM  Additional Follow-up for Phone Call Additional follow up Details #1::        Pt informed, please put labs in idx as above for tomorrow, [facility] Additional Follow-up by: Lamar Sprinkles, CMA,  Jun 25, 2009 6:07 PM    Additional Follow-up for Phone Call Additional follow up Details #2::    labs in [facility] for 5/26. Follow-up by: Verdell Face,  Jun 26, 2009 9:09 AM

## 2010-03-05 NOTE — Progress Notes (Signed)
  Phone Note Call from Patient Call back at Home Phone [phone]   Caller: Patient Call For: Tresa Garter MD Summary of Call: Pt stated that she is about to be out of her anabiotic and she does not feel any better. Pt wants to know what else can be done to help her feel better. Please advice.  Initial call taken by: Livingston Diones,  November 27, 2008 1:29 PM  Follow-up for Phone Call        Doreatha Martin the antibiotic  Use a sinus rinse Rest OV if sick  Follow-up by: Tresa Garter MD,  November 28, 2008 12:50 PM  Additional Follow-up for Phone Call Additional follow up Details #1::        Pt informed  Additional Follow-up by: Lamar Sprinkles, CMA,  November 28, 2008 2:30 PM

## 2010-03-05 NOTE — Assessment & Plan Note (Signed)
Summary: 2 MO ROV /NWS #   Vital Signs:  Patient profile:   70 year old female Height:      64 inches Weight:      131 pounds BMI:     22.57 O2 Sat:      97 % on Room air Temp:     98.0 degrees F oral Pulse rate:   72 / minute BP sitting:   120 / 60  (left arm) Cuff size:   regular  Vitals Entered By: Lucious Groves (May 06, 2009 1:21 PM)  O2 Flow:  Room air CC: F/U--c/o left great toe pain, and is read a the joint./kb Is Patient Diabetic? No Pain Assessment Patient in pain? yes     Location: toe Onset of pain  flares up every now and then per patient   CC:  F/U--c/o left great toe pain and and is read a the joint./kb.  History of Present Illness: The patient presents for a follow up of OA,  back pain, anxiety, depression and hypothyroidism. She tried Gluten free - not much change   Current Medications (verified): 1)  Synthroid 25 Mcg  Tabs (Levothyroxine Sodium) .... Once Daily 2)  Vitamin D3 1000 Unit  Tabs (Cholecalciferol) .Marland Kitchen.. 1 By Mouth Daily 3)  Meloxicam 7.5 Mg  Tabs (Meloxicam) .... Once Daily 4)  Fluticasone Propionate 50 Mcg/act  Susp (Fluticasone Propionate) .... 2 Sprays Each Nostril Once Daily 5)  Ranitidine Hcl 150 Mg Caps (Ranitidine Hcl) .Marland Kitchen.. 1 Po Bid 6)  Robaxin 500 Mg Tabs (Methocarbamol) .Marland Kitchen.. 1 By Mouth Qid As Needed Spasm 7)  Glucosamine-Chondroitin  Caps (Glucosamine-Chondroit-Vit C-Mn) .... Once Daily 8)  Hydrocodone-Acetaminophen 5-325 Mg Tabs (Hydrocodone-Acetaminophen) .Marland Kitchen.. 1-2 By Mouth Two Times A Day As Needed Pain 9)  Allegra 180 Mg Tabs (Fexofenadine Hcl) .Marland Kitchen.. 1 By Mouth Qd  Allergies (verified): 1)  ! Sulfa 2)  ! * Ivp Dye 3)  ! Fosamax 4)  ! * Evista 5)  ! * Z-Pack 6)  ! Ultram 7)  ! * Vesicar 8)  ! * Oxytrol 9)  Tylenol  Past History:  Past Medical History: Last updated: 03/05/2009 Depression GERD  Dr Ewing Schlein Hypothyroidism Low back pain Dr Phoebe Perch Osteoarthritis, inflamatory Dr Kellie Simmering Allergic rhinitis ENT Dr  Trecia Rogers  Social History: Last updated: 01/23/2007 Retired Single Never Smoked  Review of Systems  The patient denies fever, chest pain, prolonged cough, and melena.    Physical Exam  General:  Well-developed,well-nourished,in no acute distress; alert,appropriate and cooperative throughout examination Nose:  External nasal examination shows no deformity or inflammation. Nasal mucosa are pink and moist without lesions or exudates. Mouth:  Erythematous throat mucosa and intranasal erythema. A cold sore on upper lip  Lungs:  Normal respiratory effort, chest expands symmetrically. Lungs are clear to auscultation, no crackles or wheezes. Heart:  Normal rate and regular rhythm. S1 and S2 normal without gallop, murmur, click, rub or other extra sounds. Abdomen:  Sensitive in epig area Msk:  B calves are NT B hands w/severe OA deformities Lumbar-sacral spine is tender to palpation over paraspinal muscles and painfull with the ROM  Extremities:  L dist calf is a little tender in a 4 cm area w/o redness or swelling No edema B Neurologic:  alert & oriented X3.   Skin:  No rash Psych:  normally interactive, good eye contact, and A little depressed affect.     Impression & Recommendations:  Problem # 1:  LOW BACK PAIN (ICD-724.2) Assessment Unchanged  Her updated medication list for this problem includes:    Meloxicam 7.5 Mg Tabs (Meloxicam) ..... Once daily    Robaxin 500 Mg Tabs (Methocarbamol) .Marland Kitchen... 1 by mouth qid as needed spasm    Hydrocodone-acetaminophen 5-325 Mg Tabs (Hydrocodone-acetaminophen) .Marland Kitchen... 1-2 by mouth two times a day as needed pain  Problem # 2:  OSTEOARTHRITIS (ICD-715.90) Assessment: Unchanged Low gluten diet worked the best Her updated medication list for this problem includes:    Meloxicam 7.5 Mg Tabs (Meloxicam) ..... Once daily    Hydrocodone-acetaminophen 5-325 Mg Tabs (Hydrocodone-acetaminophen) .Marland Kitchen... 1-2 by mouth two times a day as needed  pain  Problem # 3:  ALLERGIC RHINITIS (ICD-477.9) Assessment: Unchanged  The following medications were removed from the medication list:    Zyrtec Hives Relief 10 Mg Tabs (Cetirizine hcl) ..... Once daily Her updated medication list for this problem includes:    Fluticasone Propionate 50 Mcg/act Susp (Fluticasone propionate) .Marland Kitchen... 2 sprays each nostril once daily    Allegra 180 Mg Tabs (Fexofenadine hcl) .Marland Kitchen... 1 by mouth qd  Problem # 4:  DEPRESSION (ICD-311) Assessment: Unchanged  Problem # 5:  HYPOTHYROIDISM (ICD-244.9) Assessment: Unchanged  Her updated medication list for this problem includes:    Synthroid 25 Mcg Tabs (Levothyroxine sodium) ..... Once daily  Complete Medication List: 1)  Synthroid 25 Mcg Tabs (Levothyroxine sodium) .... Once daily 2)  Vitamin D3 1000 Unit Tabs (Cholecalciferol) .Marland Kitchen.. 1 by mouth daily 3)  Meloxicam 7.5 Mg Tabs (Meloxicam) .... Once daily 4)  Fluticasone Propionate 50 Mcg/act Susp (Fluticasone propionate) .... 2 sprays each nostril once daily 5)  Ranitidine Hcl 150 Mg Caps (Ranitidine hcl) .Marland Kitchen.. 1 po bid 6)  Robaxin 500 Mg Tabs (Methocarbamol) .Marland Kitchen.. 1 by mouth qid as needed spasm 7)  Glucosamine-chondroitin Caps (Glucosamine-chondroit-vit c-mn) .... Once daily 8)  Hydrocodone-acetaminophen 5-325 Mg Tabs (Hydrocodone-acetaminophen) .Marland Kitchen.. 1-2 by mouth two times a day as needed pain 9)  Allegra 180 Mg Tabs (Fexofenadine hcl) .Marland Kitchen.. 1 by mouth qd 10)  Pennsaid 1.5 % Soln (Diclofenac sodium) .... 3-5 gtt on skin three times a day for pain  Patient Instructions: 1)  Please schedule a follow-up appointment in 3 months. 2)  BMP prior to visit, ICD-9: 3)  TSH prior to visit, ICD-9:995.50 4)  Lipid Panel prior to visit, ICD-9: Prescriptions: MELOXICAM 7.5 MG  TABS (MELOXICAM) once daily  #30 x 6   Entered and Authorized by:   Tresa Garter MD   Signed by:   Tresa Garter MD on 05/06/2009   Method used:   Print then Give to Patient   RxID:    [id number] FLUTICASONE PROPIONATE 50 MCG/ACT  SUSP (FLUTICASONE PROPIONATE) 2 sprays each nostril once daily  #1 x 12   Entered and Authorized by:   Tresa Garter MD   Signed by:   Tresa Garter MD on 05/06/2009   Method used:   Print then Give to Patient   RxID:   [id number] PENNSAID 1.5 % SOLN (DICLOFENAC SODIUM) 3-5 gtt on skin three times a day for pain  #1 x 3   Entered and Authorized by:   Tresa Garter MD   Signed by:   Tresa Garter MD on 05/06/2009   Method used:   Print then Give to Patient   RxID:   [id number]

## 2010-03-05 NOTE — Assessment & Plan Note (Signed)
Summary: 3 MO ROV / NWS #  RS'D PER PT/NWS   Vital Signs:  Patient profile:   70 year old female Weight:      133 pounds Temp:     98.4 degrees F oral Pulse rate:   76 / minute BP sitting:   122 / 66  (left arm)  Vitals Entered By: Tora Perches (March 05, 2009 11:04 AM) CC: f/u Is Patient Diabetic? No   CC:  f/u.  History of Present Illness: The patient presents for a follow up of hand and back pain, OA, GERD and hypothyroidism. C/o feet and hands getting very cold. C/o hands w/stinging sensations   Preventive Screening-Counseling & Management  Alcohol-Tobacco     Smoking Status: never  Current Medications (verified): 1)  Synthroid 25 Mcg  Tabs (Levothyroxine Sodium) .... Once Daily 2)  Vitamin D3 1000 Unit  Tabs (Cholecalciferol) .Marland Kitchen.. 1 By Mouth Daily 3)  Meloxicam 7.5 Mg  Tabs (Meloxicam) .... Once Daily 4)  Fluticasone Propionate 50 Mcg/act  Susp (Fluticasone Propionate) .... 2 Sprays Each Nostril Once Daily 5)  Ranitidine Hcl 150 Mg Caps (Ranitidine Hcl) .Marland Kitchen.. 1 Po Bid 6)  Robaxin 500 Mg Tabs (Methocarbamol) .Marland Kitchen.. 1 By Mouth Qid As Needed Spasm 7)  Glucosamine-Chondroitin  Caps (Glucosamine-Chondroit-Vit C-Mn) .... Once Daily 8)  Zyrtec Hives Relief 10 Mg Tabs (Cetirizine Hcl) .... Once Daily  Allergies: 1)  ! Sulfa 2)  ! * Ivp Dye 3)  ! Fosamax 4)  ! * Evista 5)  ! * Z-Pack 6)  ! Ultram 7)  ! * Vesicar 8)  ! * Oxytrol 9)  Tylenol  Past History:  Past Medical History: Depression GERD  Dr Ewing Schlein Hypothyroidism Low back pain Dr Phoebe Perch Osteoarthritis, inflamatory Dr Kellie Simmering Allergic rhinitis ENT Dr Trecia Rogers  Past Surgical History: Reviewed history from 08/29/2006 and no changes required. Lumbar laminectomy L3-4, 4-5 and L5-S1  Review of Systems  The patient denies fever, dyspnea on exertion, prolonged cough, and angioedema.    Physical Exam  General:  Well-developed,well-nourished,in no acute distress; alert,appropriate and cooperative  throughout examination Nose:  External nasal examination shows no deformity or inflammation. Nasal mucosa are pink and moist without lesions or exudates. Mouth:  Erythematous throat mucosa and intranasal erythema. A cold sore on upper lip  Neck:  No deformities, masses, or tenderness noted. Lungs:  Normal respiratory effort, chest expands symmetrically. Lungs are clear to auscultation, no crackles or wheezes. Heart:  Normal rate and regular rhythm. S1 and S2 normal without gallop, murmur, click, rub or other extra sounds. Abdomen:  Sensitive in epig area Msk:  B calves are NT Neurologic:  alert & oriented X3.   Skin:  No rash Psych:  normally interactive, good eye contact, and A little depressed affect.     Impression & Recommendations:  Problem # 1:  PARESTHESIA (ICD-782.0) Assessment New  Orders: TLB-TSH (Thyroid Stimulating Hormone) (84443-TSH) TLB-CBC Platelet - w/Differential (85025-CBCD) TLB-B12, Serum-Total ONLY (04540-J81) TLB-BMP (Basic Metabolic Panel-BMET) (80048-METABOL) TLB-Hepatic/Liver Function Pnl (80076-HEPATIC)  Problem # 2:  OSTEOPENIA (ICD-733.90) Assessment: Comment Only On prescription therapy Vit D  Problem # 3:  LOW BACK PAIN (ICD-724.2) Assessment: Unchanged  The following medications were removed from the medication list:    Darvocet-n 100 100-650 Mg Tabs (Propoxyphene n-apap) .Marland Kitchen... As needed Her updated medication list for this problem includes:    Meloxicam 7.5 Mg Tabs (Meloxicam) ..... Once daily    Robaxin 500 Mg Tabs (Methocarbamol) .Marland Kitchen... 1 by mouth qid as needed  spasm    Hydrocodone-acetaminophen 5-325 Mg Tabs (Hydrocodone-acetaminophen) .Marland Kitchen... 1-2 by mouth two times a day as needed pain  Problem # 4:  OSTEOARTHRITIS (ICD-715.90) Assessment: Unchanged  The following medications were removed from the medication list:    Darvocet-n 100 100-650 Mg Tabs (Propoxyphene n-apap) .Marland Kitchen... As needed Her updated medication list for this problem  includes:    Meloxicam 7.5 Mg Tabs (Meloxicam) ..... Once daily    Hydrocodone-acetaminophen 5-325 Mg Tabs (Hydrocodone-acetaminophen) .Marland Kitchen... 1-2 by mouth two times a day as needed pain See "Patient Instructions".   Problem # 5:  ALLERGIC RHINITIS (ICD-477.9) Assessment: Comment Only  Her updated medication list for this problem includes:    Fluticasone Propionate 50 Mcg/act Susp (Fluticasone propionate) .Marland Kitchen... 2 sprays each nostril once daily    Zyrtec Hives Relief 10 Mg Tabs (Cetirizine hcl) ..... Once daily  Complete Medication List: 1)  Synthroid 25 Mcg Tabs (Levothyroxine sodium) .... Once daily 2)  Vitamin D3 1000 Unit Tabs (Cholecalciferol) .Marland Kitchen.. 1 by mouth daily 3)  Meloxicam 7.5 Mg Tabs (Meloxicam) .... Once daily 4)  Fluticasone Propionate 50 Mcg/act Susp (Fluticasone propionate) .... 2 sprays each nostril once daily 5)  Ranitidine Hcl 150 Mg Caps (Ranitidine hcl) .Marland Kitchen.. 1 po bid 6)  Robaxin 500 Mg Tabs (Methocarbamol) .Marland Kitchen.. 1 by mouth qid as needed spasm 7)  Glucosamine-chondroitin Caps (Glucosamine-chondroit-vit c-mn) .... Once daily 8)  Zyrtec Hives Relief 10 Mg Tabs (Cetirizine hcl) .... Once daily 9)  Hydrocodone-acetaminophen 5-325 Mg Tabs (Hydrocodone-acetaminophen) .Marland Kitchen.. 1-2 by mouth two times a day as needed pain  Patient Instructions: 1)  Use stretching exercises that I have provided (15 min. or longer every day)  2)  Please schedule a follow-up appointment in 2 months. 3)  Try gluten free diet Prescriptions: HYDROCODONE-ACETAMINOPHEN 5-325 MG TABS (HYDROCODONE-ACETAMINOPHEN) 1-2 by mouth two times a day as needed pain  #60 x 1   Entered and Authorized by:   Tresa Garter MD   Signed by:   Tresa Garter MD on 03/05/2009   Method used:   Print then Give to Patient   RxID:   [id number]

## 2010-03-05 NOTE — Assessment & Plan Note (Signed)
Summary: 3 mth fu/$50/jss   Vital Signs:  Patient Profile:   70 Years Old Female Weight:      136 pounds Temp:     97.1 degrees F oral Pulse rate:   80 / minute BP sitting:   146 / 72  (left arm)  Vitals Entered By: Tora Perches (February 13, 2008 10:10 AM)                 Chief Complaint:  Multiple medical problems or concerns.  History of Present Illness: The patient presents for a follow up of back pain, anxiety, depression. LBP IS WORSE     Current Allergies (reviewed today): ! SULFA ! * IVP DYE ! FOSAMAX ! * EVISTA ! * Z-PACK ! ULTRAM ! * VESICAR ! * TUSSINEX ! * OXYTROL TYLENOL  Past Medical History:    Reviewed history from 08/02/2007 and no changes required:       Depression       GERD  Dr Ewing Schlein       Hypothyroidism       Low back pain Dr Phoebe Perch       Osteoarthritis, inflamatory       Allergic rhinitis       ENT Dr Gerilyn Pilgrim   Family History:    Reviewed history from 01/23/2007 and no changes required:       Family History Hypertension  Social History:    Reviewed history from 01/23/2007 and no changes required:       Retired       Single       Never Smoked     Physical Exam  General:     Well-developed,well-nourished,in no acute distress; alert,appropriate and cooperative throughout examination Ears:     External ear exam shows no significant lesions or deformities.  Otoscopic examination reveals clear canals, tympanic membranes are intact bilaterally without bulging, retraction, inflammation or discharge. Hearing is grossly normal bilaterally. Nose:     External nasal examination shows no deformity or inflammation. Nasal mucosa are pink and moist without lesions or exudates. Mouth:     Oral mucosa and oropharynx without lesions or exudates.  Teeth in good repair. Lungs:     Normal respiratory effort, chest expands symmetrically. Lungs are clear to auscultation, no crackles or wheezes. Heart:     Normal rate and regular rhythm. S1 and S2  normal without gallop, murmur, click, rub or other extra sounds. Abdomen:     Sensitive in epig area Msk:     No deformity or scoliosis noted of thoracic or lumbar spine.  Lumbar-sacral spine is tender to palpation over paraspinal muscles and painfull with the ROM Hand joints with deformities, painfull Neurologic:     No cranial nerve deficits noted. Station and gait are normal. Plantar reflexes are down-going bilaterally. DTRs are symmetrical throughout. Sensory, motor and coordinative functions appear intact. Skin:     Intact without suspicious lesions or rashes Psych:     normally interactive, good eye contact, and A little depressed affect.      Impression & Recommendations:  Problem # 1:  LOW BACK PAIN (ICD-724.2) Assessment: Deteriorated  Her updated medication list for this problem includes:    Darvocet-n 100 100-650 Mg Tabs (Propoxyphene n-apap) .Marland Kitchen... As needed    Meloxicam 7.5 Mg Tabs (Meloxicam) ..... Once daily    Hydrocodone-acetaminophen 10-325 Mg Tabs (Hydrocodone-acetaminophen) .Marland Kitchen... 1 by mouth up to 4 time per day as needed for pain   Problem # 2:  OSTEOARTHRITIS (ICD-715.90)  Assessment: Deteriorated  Her updated medication list for this problem includes:    Darvocet-n 100 100-650 Mg Tabs (Propoxyphene n-apap) .Marland Kitchen... As needed    Meloxicam 7.5 Mg Tabs (Meloxicam) ..... Once daily    Hydrocodone-acetaminophen 10-325 Mg Tabs (Hydrocodone-acetaminophen) .Marland Kitchen... 1 by mouth up to 4 time per day as needed for pain   Problem # 3:  HYPOTHYROIDISM (ICD-244.9) Assessment: Unchanged  Her updated medication list for this problem includes:    Synthroid 25 Mcg Tabs (Levothyroxine sodium) ..... Once daily The labs were reviewed with the patient.   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 Repeat in 6 mo  Complete Medication List: 1)  Synthroid 25 Mcg Tabs (Levothyroxine sodium) .... Once daily 2)   Darvocet-n 100 100-650 Mg Tabs (Propoxyphene n-apap) .... As needed 3)  Vitamin D3 1000 Unit Tabs (Cholecalciferol) .Marland Kitchen.. 1 by mouth daily 4)  Loratadine 10 Mg Tabs (Loratadine) .... Once daily 5)  Meloxicam 7.5 Mg Tabs (Meloxicam) .... Once daily 6)  Remifemin 20 Mg Tabs (Black cohosh) .... Once daily 7)  Ranitidine Hcl 300 Mg Tabs (Ranitidine hcl) .Marland Kitchen.. 1 po qd 8)  [facility] Vitamin B12 1000 Mcg Tabs (Cyanocobalamin) .... Once daily 9)  Hydrocodone-acetaminophen 10-325 Mg Tabs (Hydrocodone-acetaminophen) .Marland Kitchen.. 1 by mouth up to 4 time per day as needed for pain 10)  Fluticasone Propionate 50 Mcg/act Susp (Fluticasone propionate) .... 2 sprays each nostril once daily  Other Orders: H1N1 vaccine G code (Z6109)   Patient Instructions: 1)  Please schedule a follow-up appointment in 3 months. 2)  BMP prior to visit, ICD-9: 244.8   Prescriptions: FLUTICASONE PROPIONATE 50 MCG/ACT  SUSP (FLUTICASONE PROPIONATE) 2 sprays each nostril once daily  #1 vial x 3   Entered and Authorized by:   Tresa Garter MD   Signed by:   Tresa Garter MD on 02/13/2008   Method used:   Print then Give to Patient   RxID:   [id number] HYDROCODONE-ACETAMINOPHEN 10-325 MG TABS (HYDROCODONE-ACETAMINOPHEN) 1 by mouth up to 4 time per day as needed for pain  #120 x 1   Entered and Authorized by:   Tresa Garter MD   Signed by:   Tresa Garter MD on 02/13/2008   Method used:   Print then Give to Patient   RxID:   [id number]    H1N1 # 1    Vaccine Type: H1N1 vaccine G code    Site: right deltoid    Mfr: novartis    Dose: 0.5 ml    Given by: Tora Perches    Exp. Date: 05/2008    Lot #: [id number]    VIS given: 11/01/2008 given February 13, 2008.

## 2010-03-05 NOTE — Assessment & Plan Note (Signed)
Summary: 3 mos f/u$50/cd   Vital Signs:  Patient profile:   70 year old female Weight:      134 pounds Temp:     98.2 degrees F oral Pulse rate:   74 / minute BP sitting:   132 / 72  (left arm)  Vitals Entered By: Tora Perches (November 19, 2008 11:11 AM) CC: f/u Is Patient Diabetic? No   CC:  f/u.  History of Present Illness: The patient presents with complaints of sore throat, fever, cough, sinus congestion and drainge of several days duration. Not better with OTC meds. Chest hurts with coughing. Can't sleep due to cough. Muscle aches are present.  The mucus is colored. L LE painis much better.   Preventive Screening-Counseling & Management  Alcohol-Tobacco     Smoking Status: never  Current Medications (verified): 1)  Synthroid 25 Mcg  Tabs (Levothyroxine Sodium) .... Once Daily 2)  Darvocet-N 100 100-650 Mg  Tabs (Propoxyphene N-Apap) .... As Needed 3)  Vitamin D3 1000 Unit  Tabs (Cholecalciferol) .Marland Kitchen.. 1 By Mouth Daily 4)  Meloxicam 7.5 Mg  Tabs (Meloxicam) .... Once Daily 5)  [facility]vs Vitamin B12 1000 Mcg Tabs (Cyanocobalamin) .... Once Daily 6)  Fluticasone Propionate 50 Mcg/act  Susp (Fluticasone Propionate) .... 2 Sprays Each Nostril Once Daily 7)  Ranitidine Hcl 150 Mg Caps (Ranitidine Hcl) .Marland Kitchen.. 1 Po Bid 8)  Robaxin 500 Mg Tabs (Methocarbamol) .Marland Kitchen.. 1 By Mouth Qid As Needed Spasm 9)  Zyrtec Allergy 10 Mg Tabs (Cetirizine Hcl) .... Once Daily 10)  Glucosamine-Chondroitin  Caps (Glucosamine-Chondroit-Vit C-Mn) .... Once Daily 11)  Colace 50 Mg Caps (Docusate Sodium) .... Once Daily  Allergies: 1)  ! Sulfa 2)  ! * Ivp Dye 3)  ! Fosamax 4)  ! * Evista 5)  ! * Z-Pack 6)  ! Ultram 7)  ! * Vesicar 8)  ! * Tussinex 9)  ! * Oxytrol 10)  ! Hydrocodone-Acetaminophen (Hydrocodone-Acetaminophen) 11)  Tylenol  Past History:  Social History: Last updated: 01/23/2007 Retired Single Never Smoked  Past Medical History: Depression GERD  Dr Ewing Schlein Hypothyroidism Low  back pain Dr Phoebe Perch Osteoarthritis, inflamatory Allergic rhinitis ENT Dr Trecia Rogers  Physical Exam  General:  Well-developed,well-nourished,in no acute distress; alert,appropriate and cooperative throughout examination Mouth:  Erythematous throat mucosa and intranasal erythema.  Lungs:  Normal respiratory effort, chest expands symmetrically. Lungs are clear to auscultation, no crackles or wheezes. Heart:  Normal rate and regular rhythm. S1 and S2 normal without gallop, murmur, click, rub or other extra sounds. Abdomen:  Sensitive in epig area Msk:  B calves are NT Neurologic:  alert & oriented X3.   Skin:  No rash Psych:  normally interactive, good eye contact, and A little depressed affect.     Impression & Recommendations:  Problem # 1:  SINUSITIS, ACUTE (ICD-461.9)/ bronchitis Assessment New  Her updated medication list for this problem includes:    Fluticasone Propionate 50 Mcg/act Susp (Fluticasone propionate) .Marland Kitchen... 2 sprays each nostril once daily    Zithromax Z-pak 250 Mg Tabs (Azithromycin) ..... Use as directed  Problem # 2:  OSTEOPENIA (ICD-733.90) Assessment: Unchanged The office visit took longer than 20 min with patient councelling for more than 50% of the 20 min    Problem # 3:  LEG PAIN, LEFT (ICD-729.5) resolved Assessment: Comment Only The [place] was reviewed with the patient.   Complete Medication List: 1)  Synthroid 25 Mcg Tabs (Levothyroxine sodium) .... Once daily 2)  Darvocet-n 100 100-650 Mg Tabs (  Propoxyphene n-apap) .... As needed 3)  Vitamin D3 1000 Unit Tabs (Cholecalciferol) .Marland Kitchen.. 1 by mouth daily 4)  Meloxicam 7.5 Mg Tabs (Meloxicam) .... Once daily 5)  Cvs Vitamin B12 1000 Mcg Tabs (Cyanocobalamin) .... Once daily 6)  Fluticasone Propionate 50 Mcg/act Susp (Fluticasone propionate) .... 2 sprays each nostril once daily 7)  Ranitidine Hcl 150 Mg Caps (Ranitidine hcl) .Marland Kitchen.. 1 po bid 8)  Robaxin 500 Mg Tabs (Methocarbamol) .Marland Kitchen.. 1 by mouth qid as  needed spasm 9)  Zyrtec Allergy 10 Mg Tabs (Cetirizine hcl) .... Once daily 10)  Glucosamine-chondroitin Caps (Glucosamine-chondroit-vit c-mn) .... Once daily 11)  Colace 50 Mg Caps (Docusate sodium) .... Once daily 12)  Zithromax Z-pak 250 Mg Tabs (Azithromycin) .... Use as directed  Patient Instructions: 1)  Call if you are not better in a reasonable amount of time or if worse.  2)  Please schedule a follow-up appointment in 3 months. Prescriptions: ZITHROMAX Z-PAK 250 MG  TABS (AZITHROMYCIN) Use as directed  #1 x 0   Entered and Authorized by:   Tresa Garter MD   Signed by:   Tresa Garter MD on 11/19/2008   Method used:   Elect[facility] to        [id number] (retail)       [address] [place], [place]  [address]       Ph: [phone] or [phone]       Fax: [phone]   RxID:   [id number]

## 2010-03-05 NOTE — Assessment & Plan Note (Signed)
Summary: 3 mos f/u  $50  CD   Vital Signs:  Patient Profile:   70 Years Old Female Weight:      134 pounds Temp:     97 degrees F oral Pulse rate:   72 / minute BP sitting:   132 / 84  (left arm)  Vitals Entered By: Tora Perches (November 10, 2007 10:01 AM)                 Chief Complaint:  Multiple medical problems or concerns.  History of Present Illness: The patient presents for a follow up of back pain, anxiety, depression and OA.     Current Allergies (reviewed today): ! TYLENOL ! SULFA ! * IVP DYE ! FOSAMAX ! * EVISTA ! * Z-PACK ! ULTRAM ! * VESICAR ! * TUSSINEX ! * OXYTROL  Past Medical History:    Reviewed history from 08/02/2007 and no changes required:       Depression       GERD  Dr Ewing Schlein       Hypothyroidism       Low back pain Dr Phoebe Perch       Osteoarthritis, inflamatory       Allergic rhinitis       ENT Dr Gerilyn Pilgrim   Family History:    Reviewed history from 01/23/2007 and no changes required:       Family History Hypertension  Social History:    Reviewed history from 01/23/2007 and no changes required:       Retired       Single       Never Smoked     Physical Exam  General:     Well-developed,well-nourished,in no acute distress; alert,appropriate and cooperative throughout examination Mouth:     Oral mucosa and oropharynx without lesions or exudates.  Teeth in good repair. Msk:     No deformity or scoliosis noted of thoracic or lumbar spine.  Lumbar-sacral spine is tender to palpation over paraspinal muscles and painfull with the ROM Hand joints with deformities, painfull Extremities:     No clubbing, cyanosis, edema, or deformity noted with normal full range of motion of all joints.   Neurologic:     No cranial nerve deficits noted. Station and gait are normal. Plantar reflexes are down-going bilaterally. DTRs are symmetrical throughout. Sensory, motor and coordinative functions appear intact. Skin:     Intact without suspicious  lesions or rashes Psych:     normally interactive, good eye contact, and A little depressed affect.      Impression & Recommendations:  Problem # 1:  OSTEOARTHRITIS (ICD-715.90) Assessment: Deteriorated Pred taper Her updated medication list for this problem includes:    Darvocet-n 100 100-650 Mg Tabs (Propoxyphene n-apap) .Marland Kitchen... As needed    Meloxicam 7.5 Mg Tabs (Meloxicam) ..... Once daily hold  Her updated medication list for this problem includes:    Darvocet-n 100 100-650 Mg Tabs (Propoxyphene n-apap) .Marland Kitchen... As needed    Meloxicam 7.5 Mg Tabs (Meloxicam) ..... Once daily   Problem # 2:  LOW BACK PAIN (ICD-724.2) Assessment: Unchanged  Her updated medication list for this problem includes:    Darvocet-n 100 100-650 Mg Tabs (Propoxyphene n-apap) .Marland Kitchen... As needed    Meloxicam 7.5 Mg Tabs (Meloxicam) ..... Once daily   Problem # 3:  HYPOTHYROIDISM (ICD-244.9) Assessment: Unchanged  Her updated medication list for this problem includes:    Synthroid 25 Mcg Tabs (Levothyroxine sodium) ..... 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 Once daily 2)  Darvocet-n 100 100-650 Mg Tabs (Propoxyphene n-apap) .... As needed 3)  Vitamin D3 1000 Unit Tabs (Cholecalciferol) .Marland Kitchen.. 1 by mouth daily 4)  Allegra 180 Mg Tabs (Fexofenadine hcl) .... Once daily 5)  Meloxicam 7.5 Mg Tabs (Meloxicam) .... Once daily 6)  Remifemin 20 Mg Tabs (Black cohosh) .... Once daily 7)  Ranitidine Hcl 300 Mg Tabs (Ranitidine hcl) .Marland Kitchen.. 1 po qd 8)  Cvs Vitamin B12 1000 Mcg Tabs (Cyanocobalamin) .... Once daily 9)  Prednisone 10 Mg Tabs (Prednisone) .... Take 40mg  qd for 3 days, then 20 mg qd for 3 days, then 10mg  qd for 6 days, then stop. take pc.   Other Orders: Flu Vaccine 50yrs + (40981) Administration Flu vaccine (X9147)   Patient Instructions: 1)  Please schedule a follow-up appointment in 3 months. 2)  BMP prior to visit, ICD-9: 3)  Hepatic Panel prior to visit, ICD-9: 244.8  995.20 4)  TSH prior to visit, ICD-9: 5)  Urine-dip prior to visit, ICD-9: 6)  Lipid Panel prior to visit, ICD-9:   Prescriptions: SYNTHROID 25 MCG  TABS (LEVOTHYROXINE SODIUM) once daily  #30 x 12   Entered and Authorized by:   Tresa Garter MD   Signed by:   Tresa Garter MD on 11/10/2007   Method used:   [facility] to        [id number]ail[facility]       [address] [place], [place]  [address]       Ph: [phone] or [phone]       Fax: [phone]   RxID:   [id number] MELOXICAM 7.5 MG  TABS (MELOXICAM) once daily  #30 x 6   Entered and Authorized by:   Tresa Garter MD   Signed by:   Tresa Garter MD on 11/10/2007   Method used:   Print then Give to Patient   RxID:   [id number] PREDNISONE 10 MG  TABS (PREDNISONE) Take 40mg  qd for 3 days, then 20 mg qd for 3 days, then 10mg  qd for 6 days, then stop. Take pc.  #24 x 0   Entered and Authorized by:   Tresa Garter MD   Signed by:   Tresa Garter MD on 11/10/2007   Method used:   Print then Give to Patient   RxID:   [id number]  ]  Influenza Vaccine (to be given today)         Flu Vaccine Consent Questions     Do you have a history of severe allergic reactions to this vaccine? no    Any prior history of allergic reactions to egg and/or gelatin? no    Do you have a sensitivity to the preservative Thimersol? no    Do you have a past history of Guillan-Barre Syndrome? no    Do you currently have an acute febrile illness? no    Have you ever had a severe reaction to latex? no    Vaccine information given and explained to patient? yes    Are you currently pregnant? no    Lot Number:[id number]    Site Given  Left Deltoid IMdflu

## 2010-03-06 NOTE — Letter (Signed)
Summary: [facility]   Imported By: Sherian Rein 12/04/2008 13:36:54  _____________________________________________________________________  External Attachment:    Type:   Image     Comment:   External Document

## 2010-03-11 NOTE — Assessment & Plan Note (Signed)
Summary: 3 MO ROV /NWS  #   Vital Signs:  Patient profile:   70 year old female Height:      64 inches Weight:      132 pounds BMI:     22.74 Temp:     98.4 degrees F oral Pulse rate:   76 / minute Pulse rhythm:   regular Resp:     16 per minute BP sitting:   140 / 90  (left arm) Cuff size:   regular  Vitals Entered By: Lanier Prude, CMA(AAMA) (March 03, 2010 1:44 PM) CC: 3 mo f/u  Comments pt is not taking Acyclovir, Prednisone or Cetrizine   CC:  3 mo f/u .  History of Present Illness: The patient presents with complaints of sore throat, fever, cough, sinus congestion and drainge of several days duration. Not better with OTC meds. Chest hurts with coughing. Can't sleep due to cough. Muscle aches are present.  The mucus is colored. The patient presents for a follow up of OA, back pain, anxiety, depression, gammopathy, hemangiomas of the liver..   Current Medications (verified): 1)  Synthroid 25 Mcg  Tabs (Levothyroxine Sodium) .... Once Daily 2)  Vitamin D3 1000 Unit  Tabs (Cholecalciferol) .Marland Kitchen.. 1 By Mouth Daily 3)  Fluticasone Propionate 50 Mcg/act  Susp (Fluticasone Propionate) .... 2 Sprays Each Nostril Once Daily 4)  Glucosamine-Chondroitin  Caps (Glucosamine-Chondroit-Vit C-Mn) .... Once Daily 5)  Hydrocodone-Acetaminophen 5-325 Mg Tabs (Hydrocodone-Acetaminophen) .Marland Kitchen.. 1-2 By Mouth Two Times A Day As Needed Pain 6)  Cetirizine Hcl 10 Mg Tabs (Cetirizine Hcl) .Marland Kitchen.. 1 By Mouth Once Daily For Allergies 7)  Mobic 7.5 Mg Tabs (Meloxicam) .Marland Kitchen.. 1 Once Daily 8)  Prilosec 20 Mg Cpdr (Omeprazole) .Marland Kitchen.. 1 By Mouth Once Daily 9)  Allergy Injections 10)  Acyclovir 400 Mg Tabs (Acyclovir) .Marland Kitchen.. 1 By Mouth Three Times A Day As Needed Herpes X 7 D 11)  Prednisone 10 Mg Tabs (Prednisone) .... Take 40mg  Qd For 3 Days, Then 20 Mg Qd For 3 Days, Then 10mg  Qd For 6 Days, Then Stop. Take Pc. 12)  Allegra Allergy 180 Mg Tabs (Fexofenadine Hcl) .Marland Kitchen.. 1 By Mouth Once Daily  Allergies  (verified): 1)  ! Sulfa 2)  ! * Ivp Dye 3)  ! Fosamax 4)  ! * Evista 5)  ! * Z-Pack 6)  ! Ultram 7)  ! * Vesicar 8)  ! 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         ST  Physical Exam  General:  Well-developed,well-nourished,in no acute distress; alert,appropriate and cooperative throughout examination Ears:  B TMs OK Mouth:  Erythematous throat and intranasal mucosa c/w URI  Lungs:  Normal respiratory effort, chest expands symmetrically. Lungs are clear to auscultation, no crackles or wheezes. Heart:  Normal rate and regular rhythm. S1 and S2 normal without gallop, murmur, click, rub or other extra sounds. Abdomen:  Sensitive in epig area Msk:  B calves are NT B hands w/severe OA deformities Lumbar-sacral spine is tender to palpation over paraspinal muscles and painfull with the ROM  Skin:  No rash Psych:  normally interactive, good eye contact, and A  little depressed affect.     Impression & Recommendations:  Problem # 1:  OSTEOARTHRITIS (ICD-715.90) Assessment Deteriorated  Her updated medication list for this problem includes:    Hydrocodone-acetaminophen 5-325 Mg Tabs (Hydrocodone-acetaminophen) .Marland Kitchen... 1-2 by mouth two times a day as needed pain    Mobic 7.5 Mg Tabs (Meloxicam) .Marland Kitchen... 1 once daily  Problem # 2:  DEPRESSION (ICD-311) Assessment: Unchanged  Problem # 3:  HYPOTHYROIDISM (ICD-244.9) Assessment: Unchanged  Her updated medication list for this problem includes:    Synthroid 25 Mcg Tabs (Levothyroxine sodium) ..... Once daily  Labs Reviewed: TSH: 1.26 (02/24/2010)      Chol: 197 (02/24/2010)   HDL: 69.30 (02/24/2010)   LDL: 116 (02/24/2010)   TG: 60.0 (02/24/2010)  Problem # 4:  PARESTHESIA (ICD-782.0) The labs were reviewed with the patient.   Problem # 5:  LOW BACK PAIN (ICD-724.2) Assessment: Unchanged  Her updated medication list for this problem includes:    Hydrocodone-acetaminophen 5-325 Mg Tabs (Hydrocodone-acetaminophen) .Marland Kitchen... 1-2 by mouth two times a day as needed pain    Mobic 7.5 Mg Tabs (Meloxicam) .Marland Kitchen... 1 once daily  Problem # 6:  Abnormal SPEP with M-spike  ---- MGUS Assessment: Unchanged The labs were reviewed with the patient. 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 Once daily 2)  Vitamin D3 1000 Unit Tabs (Cholecalciferol) .Marland Kitchen.. 1 by mouth daily 3)  Fluticasone Propionate 50 Mcg/act Susp (Fluticasone propionate) .... 2 sprays each nostril once daily 4)  Glucosamine-chondroitin Caps (Glucosamine-chondroit-vit c-mn) .... Once daily 5)  Hydrocodone-acetaminophen 5-325 Mg Tabs (Hydrocodone-acetaminophen) .Marland Kitchen.. 1-2 by mouth two times a day as needed pain 6)  Cetirizine Hcl 10 Mg Tabs (Cetirizine hcl) .Marland Kitchen.. 1 by mouth once daily for allergies 7)  Mobic 7.5 Mg Tabs (Meloxicam) .Marland Kitchen.. 1 once daily 8)  Prilosec 20 Mg Cpdr (Omeprazole) .Marland Kitchen.. 1 by mouth once daily 9)  Allegra Allergy 180 Mg Tabs (Fexofenadine hcl) .Marland Kitchen.. 1 by mouth once daily 10)  Allergy Injections  11)  Amoxicillin 500 Mg Caps (Amoxicillin) .... 2 caps by mouth bid  Patient Instructions: 1)  Please schedule a follow-up appointment in 3 months. Prescriptions: AMOXICILLIN 500 MG CAPS (AMOXICILLIN) 2 caps by mouth bid  #40 x 0   Entered and Authorized by:   Tresa Garter MD   Signed by:   Tresa Garter MD on 03/03/2010   Method used:   Print then Give to Patient   RxID:    [id number] MOBIC 7.5 MG TABS (MELOXICAM) 1 once daily  #30 x 6   Entered and Authorized by:   Tresa Garter MD   Signed by:   Tresa Garter MD on 03/03/2010   Method used:   Print then Give to Patient   RxID:   [id number] SYNTHROID 25 MCG  TABS (LEVOTHYROXINE SODIUM) once daily  #30 Tablet x 11   Entered and Authorized by:   Tresa Garter MD   Signed by:   Tresa Garter MD on 03/03/2010   Method used:   Print then Give to Patient   RxID:   [id number]    Orders Added: 1)  Est. Patient Level IV [01027] 2)  Radiology Referral [Radiology]

## 2010-03-12 ENCOUNTER — Ambulatory Visit
Admission: RE | Admit: 2010-03-12 | Discharge: 2010-03-12 | Disposition: A | Discharge status: Home / Self Care | Payer: Medicare Other | Source: Ambulatory Visit | Attending: Internal Medicine | Admitting: Internal Medicine

## 2010-03-12 DIAGNOSIS — D1803 Hemangioma of intra-abdominal structures: Secondary | ICD-10-CM

## 2010-03-19 NOTE — Letter (Signed)
Summary: [facility]   Imported By: Sherian Rein 03/11/2010 09:37:27  _____________________________________________________________________  External Attachment:    Type:   Image     Comment:   External Document

## 2010-05-14 LAB — BASIC METABOLIC PANEL
BUN: 14 mg/dL (ref 6–23)
BUN: 8 mg/dL (ref 6–23)
CO2: 25 mEq/L (ref 19–32)
CO2: 30 mEq/L (ref 19–32)
Calcium: 7.5 mg/dL — ABNORMAL LOW (ref 8.4–10.5)
Calcium: 8.8 mg/dL (ref 8.4–10.5)
Chloride: 100 mEq/L (ref 96–112)
Chloride: 106 mEq/L (ref 96–112)
Creatinine, Ser: 0.72 mg/dL (ref 0.4–1.2)
Creatinine, Ser: 0.85 mg/dL (ref 0.4–1.2)
GFR calc Af Amer: >60 mL/min (ref >60)
GFR calc Af Amer: >60 mL/min (ref >60)
GFR calc non Af Amer: >60 mL/min (ref >60)
GFR calc non Af Amer: >60 mL/min (ref >60)
Glucose, Bld: 116 mg/dL — ABNORMAL HIGH (ref 70–99)
Glucose, Bld: 99 mg/dL (ref 70–99)
Potassium: 4.1 mEq/L (ref 3.5–5.1)
Potassium: 4.7 mEq/L (ref 3.5–5.1)
Sodium: 133 mEq/L — ABNORMAL LOW (ref 135–145)
Sodium: 137 mEq/L (ref 135–145)

## 2010-05-14 LAB — URINALYSIS, ROUTINE W REFLEX MICROSCOPIC
Bilirubin Urine: NEGATIVE
Glucose, UA: NEGATIVE mg/dL
Hgb urine dipstick: NEGATIVE
Ketones, ur: NEGATIVE mg/dL
Nitrite: NEGATIVE
Protein, ur: NEGATIVE mg/dL
Specific Gravity, Urine: 1.008 (ref 1.005–1.030)
Urobilinogen, UA: 0.2 mg/dL (ref 0.0–1.0)
pH: 6 (ref 5.0–8.0)

## 2010-05-14 LAB — PROTIME-INR
INR: 1 (ref 0.00–1.49)
Prothrombin Time: 13.6 seconds (ref 11.6–15.2)

## 2010-05-14 LAB — HEPATIC FUNCTION PANEL
ALT: 20 U/L (ref 0–35)
AST: 26 U/L (ref 0–37)
Albumin: 3.8 g/dL (ref 3.5–5.2)
Alkaline Phosphatase: 86 U/L (ref 39–117)
Bilirubin, Direct: <0.1 mg/dL (ref 0.0–0.3)
Total Bilirubin: 0.4 mg/dL (ref 0.3–1.2)
Total Protein: 6.7 g/dL (ref 6.0–8.3)

## 2010-05-14 LAB — CBC
HCT: 25.4 % — ABNORMAL LOW (ref 36.0–46.0)
HCT: 38.7 % (ref 36.0–46.0)
Hemoglobin: 13.3 g/dL (ref 12.0–15.0)
Hemoglobin: 9 g/dL — ABNORMAL LOW (ref 12.0–15.0)
MCHC: 34.3 g/dL (ref 30.0–36.0)
MCHC: 35.3 g/dL (ref 30.0–36.0)
MCV: 85.4 fL (ref 78.0–100.0)
MCV: 86 fL (ref 78.0–100.0)
Platelets: 260 10*3/uL (ref 150–400)
Platelets: 368 10*3/uL (ref 150–400)
RBC: 2.98 MIL/uL — ABNORMAL LOW (ref 3.87–5.11)
RBC: 4.5 MIL/uL (ref 3.87–5.11)
RDW: 15.3 % (ref 11.5–15.5)
RDW: 15.9 % — ABNORMAL HIGH (ref 11.5–15.5)
WBC: 6.5 10*3/uL (ref 4.0–10.5)
WBC: 7 10*3/uL (ref 4.0–10.5)

## 2010-05-14 LAB — TYPE AND SCREEN
ABO/RH(D): B NEG
Antibody Screen: NEGATIVE

## 2010-05-14 LAB — ABO/RH: ABO/RH(D): B NEG

## 2010-05-14 LAB — APTT: aPTT: 30 seconds (ref 24–37)

## 2010-05-20 ENCOUNTER — Encounter: Payer: Self-pay | Admitting: Internal Medicine

## 2010-05-26 ENCOUNTER — Ambulatory Visit (INDEPENDENT_AMBULATORY_CARE_PROVIDER_SITE_OTHER): Payer: Medicare Other | Admitting: Internal Medicine

## 2010-05-26 ENCOUNTER — Encounter: Payer: Self-pay | Admitting: Internal Medicine

## 2010-05-26 ENCOUNTER — Ambulatory Visit (INDEPENDENT_AMBULATORY_CARE_PROVIDER_SITE_OTHER)
Admission: RE | Admit: 2010-05-26 | Discharge: 2010-05-26 | Disposition: A | Discharge status: Home / Self Care | Payer: Medicare Other | Source: Ambulatory Visit | Attending: Internal Medicine | Admitting: Internal Medicine

## 2010-05-26 ENCOUNTER — Other Ambulatory Visit (INDEPENDENT_AMBULATORY_CARE_PROVIDER_SITE_OTHER): Payer: Medicare Other

## 2010-05-26 DIAGNOSIS — E039 Hypothyroidism, unspecified: Secondary | ICD-10-CM

## 2010-05-26 DIAGNOSIS — N951 Menopausal and female climacteric states: Secondary | ICD-10-CM

## 2010-05-26 DIAGNOSIS — M545 Low back pain, unspecified: Secondary | ICD-10-CM

## 2010-05-26 DIAGNOSIS — M1990 Unspecified osteoarthritis, unspecified site: Secondary | ICD-10-CM

## 2010-05-26 DIAGNOSIS — K219 Gastro-esophageal reflux disease without esophagitis: Secondary | ICD-10-CM

## 2010-05-26 LAB — CBC WITH DIFFERENTIAL/PLATELET
Basophils Absolute: 0.1 10*3/uL (ref 0.0–0.1)
Basophils Relative: 0.7 % (ref 0.0–3.0)
Eosinophils Absolute: 0.1 10*3/uL (ref 0.0–0.7)
Eosinophils Relative: 1.6 % (ref 0.0–5.0)
HCT: 38.1 % (ref 36.0–46.0)
Hemoglobin: 13.1 g/dL (ref 12.0–15.0)
Lymphocytes Relative: 27.7 % (ref 12.0–46.0)
Lymphs Abs: 2.2 10*3/uL (ref 0.7–4.0)
MCHC: 34.6 g/dL (ref 30.0–36.0)
MCV: 90.5 fl (ref 78.0–100.0)
Monocytes Absolute: 0.9 10*3/uL (ref 0.1–1.0)
Monocytes Relative: 11.3 % (ref 3.0–12.0)
Neutro Abs: 4.6 10*3/uL (ref 1.4–7.7)
Neutrophils Relative %: 58.7 % (ref 43.0–77.0)
Platelets: 389 10*3/uL (ref 150.0–400.0)
RBC: 4.21 Mil/uL (ref 3.87–5.11)
RDW: 14.7 % — ABNORMAL HIGH (ref 11.5–14.6)
WBC: 7.8 10*3/uL (ref 4.5–10.5)

## 2010-05-26 LAB — COMPREHENSIVE METABOLIC PANEL
ALT: 22 U/L (ref 0–35)
AST: 24 U/L (ref 0–37)
Albumin: 3.7 g/dL (ref 3.5–5.2)
Alkaline Phosphatase: 68 U/L (ref 39–117)
BUN: 25 mg/dL — ABNORMAL HIGH (ref 6–23)
CO2: 30 mEq/L (ref 19–32)
Calcium: 9.3 mg/dL (ref 8.4–10.5)
Chloride: 103 mEq/L (ref 96–112)
Creatinine, Ser: 0.9 mg/dL (ref 0.4–1.2)
GFR: 66.64 mL/min (ref >60.00)
Glucose, Bld: 100 mg/dL — ABNORMAL HIGH (ref 70–99)
Potassium: 5.2 mEq/L — ABNORMAL HIGH (ref 3.5–5.1)
Sodium: 139 mEq/L (ref 135–145)
Total Bilirubin: 0.3 mg/dL (ref 0.3–1.2)
Total Protein: 6.3 g/dL (ref 6.0–8.3)

## 2010-05-26 LAB — URINALYSIS
Bilirubin Urine: NEGATIVE
Hgb urine dipstick: NEGATIVE
Ketones, ur: NEGATIVE
Leukocytes, UA: NEGATIVE
Nitrite: NEGATIVE
Specific Gravity, Urine: <=1.005 (ref 1.000–1.030)
Total Protein, Urine: NEGATIVE
Urine Glucose: NEGATIVE
Urobilinogen, UA: 0.2 (ref 0.0–1.0)
pH: 5.5 (ref 5.0–8.0)

## 2010-05-26 LAB — SEDIMENTATION RATE: Sed Rate: 28 mm/hr — ABNORMAL HIGH (ref 0–22)

## 2010-05-26 LAB — TSH: TSH: 0.85 u[IU]/mL (ref 0.35–5.50)

## 2010-05-26 MED ORDER — FLUTICASONE PROPIONATE 50 MCG/ACT NA SUSP: 2.0000 | Freq: Every day | NASAL | Status: DC

## 2010-05-26 NOTE — Assessment & Plan Note (Signed)
On Rx 

## 2010-05-26 NOTE — Progress Notes (Signed)
  Subjective:    Patient ID: Kimberly Merritt, female    DOB: 09/02/1940, 70 y.o.   MRN: [id number]  HPI   The patient is here to follow up on chronic depression, anxiety,  and chronic moderate OA and fibromyalgia symptoms controlled with medicines, diet and exercise. C/o hot flashes x 1-2 months - new.   Review of Systems  Constitutional: Positive for diaphoresis. Negative for fever.  HENT: Positive for congestion and rhinorrhea. Negative for facial swelling.   Eyes: Negative for photophobia.  Respiratory: Negative for shortness of breath.   Cardiovascular: Positive for leg swelling.  Musculoskeletal: Positive for myalgias and joint swelling. Negative for gait problem.  Skin: Negative for rash.       Objective:   Physical Exam  Constitutional: She appears well-developed and well-nourished. No distress.  HENT:  Head: Normocephalic.  Right Ear: External ear normal.  Left Ear: External ear normal.  Nose: Nose normal.  Mouth/Throat: Oropharynx is clear and moist.  Eyes: Conjunctivae are normal. Pupils are equal, round, and reactive to light. Right eye exhibits no discharge. Left eye exhibits no discharge.  Neck: Normal range of motion. Neck supple. No JVD present. No tracheal deviation present. No thyromegaly present.  Cardiovascular: Normal rate, regular rhythm and normal heart sounds.   Pulmonary/Chest: No stridor. No respiratory distress. She has no wheezes.  Abdominal: Soft. Bowel sounds are normal. She exhibits no distension and no mass. There is no tenderness. There is no rebound and no guarding.  Musculoskeletal: She exhibits edema and tenderness.       B MCP deformities  Lymphadenopathy:    She has no cervical adenopathy.  Neurological: She displays normal reflexes. No cranial nerve deficit. She exhibits normal muscle tone. Coordination normal.  Skin: No rash noted. No erythema.  Psychiatric: She has a normal mood and affect. Her behavior is normal. Judgment and thought  content normal.       Lab Results  Component Value Date   WBC 6.4 02/24/2010   HGB 13.3 02/24/2010   HCT 38.6 02/24/2010   PLT 383.0 02/24/2010   CHOL 197 02/24/2010   TRIG 60.0 02/24/2010   HDL 69.30 02/24/2010   LDLDIRECT 131.0 02/05/2008   ALT 18 02/24/2010   AST 19 02/24/2010   NA 141 02/24/2010   K 5.0 02/24/2010   CL 103 02/24/2010   CREATININE 0.9 02/24/2010   BUN 19 02/24/2010   CO2 32 02/24/2010   TSH 1.26 02/24/2010   INR 1.0 04/19/2008      Assessment & Plan:  OSTEOARTHRITIS On Rx  HYPOTHYROIDISM On Rx  GERD Worse. Restart Omeprazole  LOW BACK PAIN On Rx    Hot flashes - relapsed  Check labs and CXR and SPEP Try black cohosh  MGUS  Labs as above

## 2010-05-26 NOTE — Assessment & Plan Note (Signed)
Worse Re-start Omeprazole 

## 2010-05-28 ENCOUNTER — Telehealth: Payer: Self-pay | Admitting: Internal Medicine

## 2010-05-28 LAB — PROTEIN ELECTROPHORESIS, SERUM
Albumin ELP: 59.2 % (ref 55.8–66.1)
Alpha-1-Globulin: 4.2 % (ref 2.9–4.9)
Alpha-2-Globulin: 10.2 % (ref 7.1–11.8)
Beta 2: 1.9 % — ABNORMAL LOW (ref 3.2–6.5)
Beta Globulin: 18.9 % — ABNORMAL HIGH (ref 4.7–7.2)
Gamma Globulin: 5.6 % — ABNORMAL LOW (ref 11.1–18.8)
M-Spike, %: 0.77 g/dL
Total Protein, Serum Electrophoresis: 6.8 g/dL (ref 6.0–8.3)

## 2010-05-28 NOTE — Telephone Encounter (Signed)
Pt informed

## 2010-05-28 NOTE — Telephone Encounter (Signed)
Kimberly Merritt , please, inform the patient: labs and CXR are Ok   Please, keep  next office visit appointment.   Thank you !

## 2010-05-28 NOTE — Telephone Encounter (Signed)
Kimberly Merritt, please, inform patient that all labs are ok Thx 

## 2010-05-29 NOTE — Telephone Encounter (Signed)
Left detailed mess informing pt of below.  

## 2010-06-16 NOTE — Op Note (Signed)
NAMEVERCIE, POKORNY NO.:  [id number]   MEDICAL RECORD NO.:  [id number]          PATIENT TYPE:  INP   LOCATION:  3001                         FACILITY:  [facility]   PHYSICIAN:  Clydene Fake, M.D.  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   PROCEDURE:  Redo decompressive laminectomy at L2 and L3, decompression  of those nerve roots (two levels), posterior lumbar interbody fusion at  L2-3, Saber interbody cages at L2-3, exploration of fusion at L5-S1 with  revision of S1 pedicle screws and pedicle screw fixation at L2-3 and at  L5-S1 with removal of L4 screws, posterolateral fusion L2-3 and L5-S1,  autograft, and Infuse BMP.   SURGEON:  Clydene Fake, MD   ASSISTANT:  Hewitt Shorts, MD   ASSISTANT:  General endotracheal tube anesthesia.   BLOOD LOSS:  400 mL.   BLOOD GIVEN:  100 mL Cell Saver returned.   COMPLICATIONS:  None.   DRAINS:  None.   REASON FOR PROCEDURE:  The patient is a 70 year old woman who has had  prior surgery and fusion at L3 through S1, who has developed back and  leg pain, trouble walking.  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  The patient was brought in for  decompression and fusion.   PROCEDURE IN DETAIL:  The patient was brought into the operating room  and general anesthesia was induced.  The patient was placed in the prone  position on a Wilson frame with all pressure points padded.  The patient  was prepped and draped in sterile fashion.  A standard incision was  injected with 20 mL of 1% lidocaine with  epinephrine.  Incision was then  made at site of previous scar.  The lower lumbar spine incision extended  cephalad.  Incision was taken down to the fascia.  Hemostasis obtained  with Bovie cauterization.  Fascia was incised and subperiosteal  dissection was done over the L2 as well as left L3, spinous process and  lamina, out to facets as we went out laterally from the pedicle screws.  Starting at L3, we dissected the pedicle screws and rod system L3-S1  bilaterally.  We placed a marker at the pedicle, entry point at L2.  We  can see the screws at L3, we took an x-ray, this confirmed our  positioning.  Decompressive laminectomy was then done with Leksell  rongeurs, Kerrison punches, and high-speed drill, decompression of  central canal and the L2 and L3 nerve roots bilaterally.  Extensive  facetectomy was done bilaterally, so we can decompress the nerve roots,  and once more laminectomy was done than was needed for PLIF in order to  get the  pressure off the nerve root especially the exiting L2 roots.  All the bone during the laminectomy was cleaned from its soft tissue,  chopped into small pieces and used later in the case for fusion.  We  dissected down through the scar tissue from the previous operation and  this was done very carefully and no sign of CSF leak was noted.  L2-3  disk space was found.  Epidural space with good hemostasis with bipolar  cauterization.  The disk space was incised.  Diskectomy done  bilaterally.  We distracted the interspace to 9 mm bilaterally and then  used the broaches and interbody scrapers to prepare the interbody space  for interbody fusion.  Using curettes, we scraped the endplates, removed  the disk, and then with distraction on one side, we tapped a Saber  interbody cage on the opposite side and 9 mm high cages were used.  We  packed the cages with Infuse BMP and autograft bone and the disk space  prior to putting the cage and was also packed with  autograft bone.  After one cage was then removed, the distraction on the other side and  tapped a second cage into place.  Both cages were then explored in good  position and disk space was increased in size.  We had good  decompression of the thecal sac in the 2 and 3 roots.  We then removed  the locking nuts and rods from the screws that were in previously from  the L3 through S1.  L4 screws were removed, and then we removed  bilateral L5 screws.  We explored this area and this fusion drilled down  through fibrous tissue mass, I did not see a great posterior fusion.  We  were able to drilled down to bone from L5-S1.  We then tapped the S1  hole with a larger tap on the right side.  The 7-mm tap was firmly in  place.  We tapped a little bit further through and the ligature was  bicortical and then placed a slightly longer Expedium screws __________  a slightly wider 7-mm screw into the S1 pedicle on the right.  In the  left side, we tapped with a 7-mm screw and that was still loose.  The  halos around the 6 screws.  So, we placed an 8-mm diameter screw  bicortically on the left side.  We then placed an Infuse BMP in the  posterolateral gutters and the autograft bone.  The posterolateral  gutters for posterolateral fusion to augment her fusion at L5-S1 level.  Rods were then placed.  The screw heads locking nuts placed and these  were final tightened.  Attention was then taken up to L2-3 level.  We  had the screws in L3 already.  Using intraoperative landmarks, the  transverse process, and facets, and intraoperative fluoroscopy, we  decorticated at the pedicle entry point, placed a probe down the  pedicle, felt with a small ball probe until we had good bony  circumference, tapped the hole, and then placed a pedicle screw.  This  was repeated on the contralateral side.  We used high-speed drill to  decorticate the transverse process, lateral facets at L2-3 and even  little bit down to L4.   We then placed an Infuse BMP and autograft bone  graft in the posterolateral gutters for posterolateral fusion at L2-3.  Rods were placed in the screw heads and locking nuts were placed.  We  tightened the left  side of L2 with slight compression, tightened the  nuts at L3.  When it was finished, we explored again the nerve roots and  the thecal sac.  We had good decompression.  We did have final AP and  lateral __________ of screws and the interbody cages at L2-3 and L5-S1.  The rods were actually put in after these fluoroscopic images, final  fluoroscopic images were obtained.  We then explored the nerve roots.  We had good decompression.  We had good hemostasis.  Retractors were  removed.  Fascia closed with 0 Vicryl interrupted suture.  Subcutaneous  tissue closed with 2-0 and 3-0 Vicryl interrupted sutures.  Skin closed  with benzoin, Steri-Strips, dressing was placed.  The patient was placed  back in supine position, awoken from anesthesia, and transferred to  recovery room in stable condition.           ______________________________  Clydene Fake, M.D.     JRH/MEDQ  D:  04/23/2008  T:  04/24/2008  Job:  [id number]

## 2010-06-16 NOTE — Procedures (Signed)
DUPLEX DEEP VENOUS EXAM - LOWER EXTREMITY   INDICATION:  Left lower extremity pain.   HISTORY:  Edema:  Left lower extremity.  Trauma/Surgery:  Left lower extremity GSV ablation 03/29/2006 by Dr.  Hart Rochester.  Pain:  Left lower extremity.  PE:  No.  Previous DVT:  No.  Anticoagulants:  No.  Other:   DUPLEX EXAM:                CFV   SFV   PopV   PTV   GSV                R  L  R  L  R  L   R  L  R  L  Thrombosis    0  0     0     0      0  Spontaneous   +  +     +     +      +  Phasic        +  +     +     +      +  Augmentation  +  +     +     +      +  Compressible  +  +     +     +      +  Competent     +  D     +     +      +   Legend:  + - yes  o - no  p - partial  D - decreased   IMPRESSION:  1. No evidence of deep or superficial venous thrombosis in the left      lower extremity or right common femoral vein.  2. Mild left common femoral vein venous insufficiency noted.  3. Left greater saphenous main trunk is not visualized consistent with      previous laser ablation.  Compressible small lateral branch was      noted.    _____________________________  Di Kindle. Edilia Bo, M.D.   AS/MEDQ  D:  10/31/2008  T:  10/31/2008  Job:  [id number]

## 2010-06-19 NOTE — H&P (Signed)
NAMEORETTA, BERKLAND NO.:  [id number]   MEDICAL RECORD NO.:  [id number]          PATIENT TYPE:  ORB   LOCATION:  4527                         FACILITY:  [facility]   PHYSICIAN:  Erick Colace, M.D.DATE OF BIRTH:  Apr 29, 1940   DATE OF ADMISSION:  03/24/2004  DATE OF DISCHARGE:                                HISTORY & PHYSICAL   DATE OF BIRTH:  [birth year].   MEDICAL RECORD NUMBER:  [id number]   HISTORY:  A 70 year old female admitted on March 17, 2004, to [facility] with low back pain radiating to bilateral lower  extremities.  She particular had right lower extremity coldness and  numbness.  X-ray and imaging studies showed lumbar stenosis with spondylosis  L3-4, 4-5, and L5-S1.  She underwent posterior lumbar fusion L3-4, 4-5, and  L5-S1 on March 17, 2004, by Dr. Phoebe Perch.  Pain control with PCA which was  discontinued on February 17.  A Lidoderm patch was applied.  She was fitted  with a brace.  She used Vicodin for pain.  Her Foley tube was removed on  March 19, 2004, and she has been able to void.   REVIEW OF SYSTEMS:  No chest pain, no shortness of breath.  Positive reflux.  Positive weakness in the legs.  Positive back pain, especially with  movement.   PAST MEDICAL HISTORY:  1.  Hypothyroidism.  2.  Osteoporosis.  3.  GERD.   PAST SURGICAL HISTORY:  1.  T&A.  2.  Hysterectomy.  3.  Appendectomy.  4.  Bunionectomy.  5.  Bilateral carpal tunnel release.   SOCIAL HISTORY:  Negative ethanol, negative tobacco.  Lives alone in [place],  retired.  No local family.  One-level home with five steps to enter.   FAMILY HISTORY:  Noncontributory.   PRIOR FUNCTIONAL HISTORY:  Was independent with all self-care and mobility.   CURRENT FUNCTIONAL STATUS:  Supervision with bed mobility, supervision  transfer, supervision for gain 200 feet rolling walker.   CURRENT MEDICATIONS:  1.  Synthroid 0.25 mg p.o. daily.  2.  Protonix  40 mg p.o. daily.  3.  Lidoderm patch 2 patches, on 12, off 12.  4.  Colace 100 mg b.i.d.  5.  __________ 500 p.o. q.6h. p.r.n.  6.  Trinsicon 1 p.o. b.i.d.  7.  Vicodin 5/325 one to two tables p.o. q.4-6h. p.r.n.   PHYSICAL EXAMINATION:  VITAL SIGNS:  Blood pressure 117/65, pulse 84,  respirations 18, temperature 98.6.  GENERAL:  Thin, elderly female in no acute distress.  HEENT:  Eyes not injected.  Pupils are reactive.  Ears, nose, and throat  appear intact.  NECK:  Supple without adenopathy.  LUNGS:  Respirations are unlabored, positive breath sounds.  No rhonchi or  rales.  HEART:  Regular rate and rhythm.  No rubs or extra sounds.  EXTREMITIES:  No clubbing, cyanosis, or edema.  ABDOMEN:  Could not assess secondary to her brace.  MUSCULOSKELETAL:  Her digits show osteoarthritic changes, particularly PIPs.  Range of motion is full in the hands and shoulders.  Range of motion in the  back is diminished, and she is limited to using her brace when she is out of  bed.  NEUROLOGIC:  Lower extremity strength is 4-/5 proximally.  Otherwise she is  4/5 strength bilateral lower extremities and 4+/5 bilateral upper  extremities.  Cognition is intact.  Sensation is mildly diminished in the  toes, more of tingling rather than an insensate foot.   IMPRESSION:  1.  Lumbar laminectomy L3-4, 4-5, L5-S1 on March 17, 2004, making her      postoperative day #7.  She has stenosis and spondylosis of the lumbar      spine.  2.  Pain management with Lidoderm patch and Vicodin.  3.  Deep vein thrombosis prophylaxis with thigh-high TEDs.  4.  Hypothyroidism.  Continue Synthroid.  5.  Gastroesophageal reflux disease.  Continue Protonix.  6.  Osteoporosis.  Actonel weekly.  7.  Anemia.  Follow up CBC.   Estimated stay is five days.  Prognosis for functional improvement is good.  Her goals are modified independent.      AEK/MEDQ  D:  03/24/2004  T:  03/24/2004  Job:  [id number]   cc:   Clydene Fake, M.D.  [address]  [place]  [place] [address]  Fax: [phone]   Georgina Quint. Plotnikov, M.D. [facility]

## 2010-06-19 NOTE — Discharge Summary (Signed)
Kimberly Merritt, Kimberly Merritt NO.:  [id number]   MEDICAL RECORD NO.:  [id number]          PATIENT TYPE:  ORB   LOCATION:  4527                         FACILITY:  [facility]   PHYSICIAN:  Kimberly Merritt, M.D.DATE OF BIRTH:  1941/01/13   DATE OF ADMISSION:  03/24/2004  DATE OF DISCHARGE:  03/31/2004                                 DISCHARGE SUMMARY   DISCHARGE DIAGNOSES:  1.  Lumbar laminectomy L3-4, 4-5, and L5-S1 February 14 secondary to spinal      stenosis.  2.  Pain control.  3.  Hypothyroidism.  4.  Gastroesophageal reflux disease.  5.  Osteoporosis.  6.  Anemia.   HISTORY OF PRESENT ILLNESS:  A 70 year old white female admitted February 14  with low back pain radiating to lower extremities.  X-rays and imaging  showed lumbar stenosis with spondylosis, L3-4, 4-5, and L5-S1.  Underwent  posterior lumbar fusion of L3-4, 4-5, and L5-S1 February 14 per Dr. Sylvie Merritt.  Pain control with PCA, discontinued February 17.  A Lidoderm patch remained  in place prior to admission for pain control.  She was fitted with a TLSO  brace.  Foley catheter tube removed February 16.  She was admitted to  subacute care services.   PAST MEDICAL HISTORY:  See discharge diagnoses.   ALLERGIES:  1.  IV DYE.  2.  SULFA.   No alcohol or tobacco.   SOCIAL HISTORY:  Lives alone in [place].  Retired.  No local family.  One  level home, five steps to entry.   MEDICATIONS PRIOR TO ADMISSION:  1.  Synthroid 0.25 mg daily.  2.  Prilosec 20 mg daily.  3.  Celebrex 200 mg daily.  4.  Actonel weekly.  5.  Lidoderm patch.  6.  Robaxin as needed.   HOSPITAL COURSE:  The patient did well while in rehabilitation services with  therapies initiated on a daily basis.  The following issues are followed  during patient's rehab course.  Pertaining to Kimberly Merritt lumbar  laminectomy March 17, 2004; surgical site healing nicely.  No signs of  infection.  TLSO brace in place.  Functional mobility  continued to improve  as she was contact guard for her mobility.  She required simple set up for  activities of daily living.  Plan is to be discharged to home with home  health therapies.  She had no bowel or bladder disturbances.  Pain control  with the use of Lidoderm patch as prior to admission and Vicodin as needed.  It was discussed the slow taper of Lidoderm patch.  She remained on her  Synthroid for hypothyroidism.  She had no blood pressure variables on no  present anti-hypertensive medications.  Her mood and spirits remained upbeat  throughout her rehab course.  Postoperative anemia monitored at 9.9,  hematocrit 27.7.   Latest labs showed a sodium of 138, potassium 4.2, BUN 12, creatinine 0.7,  hemoglobin 9.9, hematocrit 27.7.  She was discharged to home.   DISCHARGE MEDICATIONS:  1.  Trinsicon one capsule twice daily.  2.  Lidoderm patch removed after  every 12 hours.  3.  Multivitamin daily.  4.  Synthroid 25 mcg daily.  5.  Robaxin as needed spasms.  6.  Vicodin one or two tablets every four hours as needed pain.   ACTIVITY:  As tolerated with brace in place.   DIET:  Regular.   WOUND CARE:  Cleanse incision daily with warm soap and water.   SPECIAL INSTRUCTIONS:  No driving, no smoking.  Home health physical and  occupational therapy.  She should follow up with Dr. Phoebe Merritt, neurosurgery;  Dr. Posey Merritt, medical management.      DA/MEDQ  D:  03/30/2004  T:  03/30/2004  Job:  [id number]   cc:   Kimberly Merritt, M.D. Kimberly Merritt   Kimberly Merritt, M.D.  [address]  [place]  [place] [address]  Fax: [phone]

## 2010-06-19 NOTE — Procedures (Signed)
[place]  Patient:    Kimberly Merritt, Kimberly Merritt Visit Number: [id number] MRN: [id number]          Service Type: END Location: ENDO Attending Physician:  Nelda Marseille Dictated by:   Petra Kuba, M.D. Proc. Date: 02/15/01 Admit Date:  02/15/2001   CC:         Sonda Primes, M.D. [facility]   Procedure Report  PROCEDURE PERFORMED:  Colonoscopy.  ENDOSCOPIST:  Petra Kuba, M.D.  INDICATIONS FOR PROCEDURE:  Screening.  Consent was signed after risks, benefits, methods, and options were thoroughly discussed in the office.  MEDICATIONS USED:  Demerol 80 mg, Versed 8 mg.  DESCRIPTION OF PROCEDURE:  Rectal inspection was pertinent for external hemorrhoids, small.  Digital exam was negative.  Pediatric video adjustable colonoscope was inserted and easily advanced around the colon to the cecum. This did require some abdominal pressure but no position changes.  On insertion, some left-sided diverticula were seen but no other abnormalities. The cecum was identified by the appendiceal orifice and the ileocecal valve. In fact the scope was inserted a short ways into the terminal ileum which was normal.  Photodocumentation was obtained.  The scope was slowly withdrawn. The prep was adequate.  There was some liquid stool that required washing and suctioning.  She did have a tortuous colon and we fell back around a loop and did try to readvance around the curves to decrease the chance of missing things.  Other than the left-sided diverticula and the questionable tiny midsigmoid polyp which was cold biopsied x 2, no other abnormalities were seen as we slowly withdrew back to the rectum.  Once back in the rectum, the scope was retroflexed, pertinent for some internal hemorrhoids.  The scope was straightened and readvanced a short ways up the left side of the colon.  Air was suctioned, scope removed.  The patient tolerated the procedure well. There was no  obvious immediate complication.  ENDOSCOPIC DIAGNOSIS: 1. Internal and external hemorrhoids. 2. Left-sided diverticula. 3. Tortuous colon. 4. A small midsigmoid polyp status post cold biopsy. 5. Otherwise within normal limits to the terminal ileum.  PLAN:  Await pathology.  Will probably recheck colon screening in five to 10 years and continue work-up with a one time esophagogastroduodenoscopy to rule out Barretts esophagus because of her chronic reflux. Dictated by:   Petra Kuba, M.D. Attending Physician:  Nelda Marseille DD:  02/15/01 TD:  02/15/01 Job: [id number]
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
## 2010-06-19 NOTE — Discharge Summary (Signed)
NAMEVERNICE, MANNINA NO.:  [id number]   MEDICAL RECORD NO.:  [id number]          PATIENT TYPE:  INP   LOCATION:  3028                         FACILITY:  [facility]   PHYSICIAN:  Clydene Fake, M.D.  DATE OF BIRTH:  [birth year]   DATE OF ADMISSION:  03/17/2004  DATE OF DISCHARGE:  03/24/2004                                 DISCHARGE SUMMARY   DIAGNOSES:  1.  Lumbar stenosis.  2.  Spondylosis.  3.  Unstable spinal listhesis.   DISCHARGE DIAGNOSES:  1.  Lumbar stenosis.  2.  Spondylosis.  3.  Unstable spinal listhesis.   PROCEDURE:  Gill decompressive lamina L4-5 with decompression for stenosis  at L3-4 and L5-S1, posterior lumbar interbody fusion at L3-4 and L4-5, with  interbody cages at L3-4, with interbody cages at L4-5, segmented pedicle  screw fixation with medium screws at L3, S1, and posterolateral fusion of  L3, S1 with allograft and Symphony.   REASON FOR ADMISSION:  The patient is a 70 year old woman with severe back  and right leg pain, numbness, with sudden left leg pain over the last few  months.  She was found to have severe stenosis at L4-5, and unstable spinal  listhesis and instability and spinal neck changes at 3-4, and some lateral  recess stenosis there, and lateral recess stenosis at L5-S1.  The patient is  brought in for decompression and fusion.   HOSPITAL COURSE:  The patient was admitted the day of surgery, underwent the  procedure above without complications.  Postoperatively, transferred to the  recovery room and then to the floor.  There, she was moving legs well.  She  was complaining of incisional pain, but also was somewhat confused, probably  with morphine PCA.  PT and OT were consulted due to increase activity.  The  patient really was making progress, had no leg pain, but did have some  moderate incisional pain.  She was also in the step-down unit for a couple  of days and then transferred to the floor on March 19, 2004.   She was  starting to ambulate a little bit, had a mild ileus, but did have good bowel  sounds and positive flatus.  There was no bowel movement, a little bit  nauseous.  We consulted PM&R for rehab who felt she might be a good  candidate, but they had no beds available.  She had been walking to the  rehab from the floor, slowly making some progress, but did have some issues  with pain with the incisional area.  Meanwhile, she had slowly increased and  continued to make some progress, and on March 24, 2004, the patient was  discharged to [facility] in stable condition.  Will followup after discharge from  [facility] in our office in three to four weeks.      JRH/MEDQ  D:  06/18/2004  T:  06/18/2004  Job:  [id number]

## 2010-06-19 NOTE — Op Note (Signed)
[place]  Patient:    Kimberly Merritt, Kimberly Merritt Visit Number: [id number] MRN: [id number]          Service Type: END Location: ENDO Attending Physician:  Nelda Marseille Dictated by:   Petra Kuba, M.D. Proc. Date: 02/15/01 Admit Date:  02/15/2001   CC:         Sonda Primes, M.D. [facility]   Operative Report  PROCEDURE PERFORMED:  Esophagogastroduodenoscopy with biopsies.  ENDOSCOPIST:  Petra Kuba, M.D.  INDICATIONS FOR PROCEDURE:  Patient with chronic reflux, want to rule out Barretts.   Consent was signed after risks, benefits, methods, and options were thoroughly discussed in the office.  MEDICATIONS USED:  No additional medicines were used for this procedure since it followed the colonoscopy.  DESCRIPTION OF PROCEDURE:  The video endoscope was inserted by direct vision. The proximal and midesophagus were normal.  In the distal esophagus was a moderate-sized hiatal hernia  with a widely patent fibrous thin ring.  The scope passed easily into the stomach, advanced through the antrum pertinent for some minimal to moderate antritis, through a normal pylorus, into a normal duodenal bulb and around the C-loop to a normal second portion of the duodenum.  A quick look at the ampulla was normal.  Scope was drawn back to the bulb which was normal.  The scope was drawn back to the stomach and retroflexed.  High in the cardia, the hiatal hernia was confirmed.  The angularis, lesser and greater curve, and fundus were all normal except for a mild to moderate amount of gastritis.  Straight visualization confirmed the above.  A few scattered biopsies of the antrum, mid and proximal stomach were obtained to confirm the gastritis and rule out Helicobacter pylori.  Air was suctioned, scope removed.  Again, a good look at the esophagus was normal except for the hiatal hernia and the ring mentioned above.  The scope was removed.  The patient tolerated the  procedure well.  There was no obvious immediate complication.  ENDOSCOPIC DIAGNOSIS: 1. Moderate hiatal hernia with a widely patent thin ring. 2. Mild to moderate antritis, gastritis, status post biopsy. 3. Otherwise within normal limits esophagogastroduodenoscopy.  PLAN:  Will restart Nexium since that seemed to work best of the pump inhibitors, she has tried.  Be happy to see back p.r.n. or in two to three months to recheck symptoms and make sure no further work-up plans are needed. Otherwise return care to Dr. Posey Rea for customary health care maintenance to include yearly rectals and guaiacs. Dictated by:   Petra Kuba, M.D. Attending Physician:  Nelda Marseille DD:  02/15/01 TD:  02/15/01 Job: [id number]

## 2010-06-19 NOTE — Discharge Summary (Signed)
Kimberly Merritt, Kimberly Merritt                  ACCOUNT NO.:  [id number]   MEDICAL RECORD NO.:  [id number]          PATIENT TYPE:  INP   LOCATION:  3001                         FACILITY:  [facility]   PHYSICIAN:  Clydene Fake, M.D.  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   PROCEDURE:  Redo decompressive laminectomy at L2 and L3 with posterior  lumbar interbody fusion at L2-L3 with interbody Saber cages  posterolateral fusion and exploration of fusion at L5-S1 with revision  of S1 pedicle screws and pedicle screw fixation at L2-3 and  L5-S1with  autograft and infuse BMP.   REASON FOR ADMISSION:  The patient is a 70 year old woman who has had  surgery, fusion L3 through S1.  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  The  patient brought in for decompression and fusion at L2-L3 level and  exploration, augmentation of the fusion at L5-S1.   HOSPITAL COURSE:  The patient was admitted on the day of surgery and  underwent the procedure without complications.  Postop, the patient was  transferred to the recovery room and then to the floor.  She developed  nausea and vomiting for the first 24 hours, but was then improved, some  minimal discharge from the incision, but then after the first day she  started increasing her activity and up ambulating.  PT and OT started to  work with the patient and she had some slow progress.  She developed  some nauseousness the following day, but has had a course when she used  the warfarin PCA and so we  discontinued that and started p.o. pain meds.  The incision looked good.  We had an increase in pulmonary toilet and  encouraged her regarding increasing activity.  She continued to make  some improvement.  We had the case manager work on some discharge  planning to SNF versus home health.  She continued to improve with her  activity and on April 29, 2008, she was up ambulating much better than  preop.  Her incision is clean, dry, and intact, so we discharged her  home in a stable condition with home health PT, nurse and aide to set up  to help assist at home.   DISCHARGE MEDICATIONS:  1. Vicodin.  2. Benadryl p.r.n.  3. Robaxin p.r.n.  4. All prior medications.   No strenuous activity, up with brace only, and follow up with me in few  weeks in the office.           ______________________________  Clydene Fake, M.D.     JRH/MEDQ  D:  05/23/2008  T:  05/24/2008  Job:  [id number] 

## 2010-06-19 NOTE — Op Note (Signed)
NAMESEREENA, MARANDO NO.:  [id number]   MEDICAL RECORD NO.:  [id number]          PATIENT TYPE:  INP   LOCATION:  2899                         FACILITY:  [facility]   PHYSICIAN:  Clydene Fake, M.D.  DATE OF BIRTH:  1941-01-09   DATE OF PROCEDURE:  03/17/2004  DATE OF DISCHARGE:                                 OPERATIVE REPORT   DIAGNOSIS:  Lumbar stenosis, spondylosis, unstable spondylolisthesis.   POSTOPERATIVE DIAGNOSIS:  Lumbar stenosis, spondylosis, unstable  spondylolisthesis.   PROCEDURE:  Gill decompressive laminectomy at L4-5, with decompression at L3-  4 and L5-S1, posterior lumbar interbody fusion at L3-4 and L4-5, with  Brantigan interbody cages at L3-4 and DePuy impactable interbody cages at L4-  5, segmented pedicle screw fixation with Expedium system L3-S1,  posterolateral fusion L3-S1, allograft and Symphony.   SURGEON:  Clydene Fake, M.D.   ASSISTANT:  Coletta Memos, M.D.   ANESTHESIA:  General endotracheal tube anesthesia.   ESTIMATED BLOOD LOSS:  700 mL. One unit of Cell Saver given back.   COMPLICATIONS:  None.   DRAINS:  None.   REASON FOR PROCEDURE:  The patient is a 70 year old woman who has severe  back and right leg pain and numbness and sudden left leg pain now starting  over the last couple months also, who was found to have severe stenosis at  L4-5 with unstable spondylolisthesis, with some instability and spinal  changes at L3-4, and some lateral recess stenosis there, and degenerative  disc disease and spondylotic changes and some lateral recess stenosis at L5-  S1.  The patient was brought in for a decompressive laminectomy and fusion.   PROCEDURE IN DETAIL:  The patient was brought into the operating room and  general anesthesia induced.  The patient was placed in a prone position on  the Wilson frame, with all pressure points padded.  The patient was prepped  and draped in a sterile fashion.  The area of incision was  injected with 20  mL of 1% lidocaine with epinephrine.  Incision was then made in the midline  lumbar spine.  The incision was taken from the L2 spinous process down to  the sacrum.  Incision was taken down to the fascia.  Hemostasis was obtained  with Bovie cauterization.  The fascia was incised, and subcostal dissection  was done over the spinous processes and lamina out to the transverse  processes, exposing the transverse processes of L3, L4, L5, and the lateral  sacrum bilaterally.  Fluoroscopic imaging was used to confirm our  positioning, and self-retaining retractors were placed.  Decompression was  then started with Leksell rongeurs.  The spinous processes, lamina, and  facets were removed at L5-S1 and L4-5.  __________ Delane Ginger decompressive  laminectomy done at the L4-5 level.  I decompressed L3-4, and there was  significant compression at L4-5, with ___________ ligaments scarred down to  dura carefully, and to dissect this off in order to produce great central  decompression.  We did have to follow the nerve roots out laterally and  decompress them, especially the  L4 and L5 roots, especially on the right  side, which were compressed up to the foramen.  When we were finished, we  did have a good decompression.  Attention was then taken to the disc space  at L3-4.  Hemostasis was obtained with bipolar cauterization.  The disc  space was incised bilaterally, and diskectomy was performed with pituitary  rongeurs and curets.  We distracted the disc space up to 9 mm and prepared  the interbody space for interbody cage fusion using the various broaches,  and of note, __________.  All the bone that was removed during laminectomy  was cleaned from the soft tissue, chopped up into small pieces, and placed  through the Symphony system with the platelet-rich plasma.  This mixture was  then put in the interspace and then packed two 9 mm x 9 mm Brantigan cages.  These cages were then tapped into  place, one on each side at L3-4 and  countersunk slightly.  Attention was then taken to L4-5.  Disc space was  incised.  Large spondylolisthesis was seen there.  We distracted the  interspace up to 9 mm.  This reduced the spondylolisthesis slightly.  We  prepared the interspace for interbody cages, but we used the Saber cages so  we could impact them and try to protect the upper L4 roots by not using the  instrumentation used to put in the [place] cage.  Autograft for Symphony  bone was placed in the interspace and packed in the Saber cages, 9 high, and  then the cages tapped into place.  We then explored the disc space at L5-S1,  with no further compression of the S1 nerve roots after the ___________  laminectomy and foraminotomies, and this disc was left in place.  We then  prepared the posterolateral gutters for a posterolateral fusion,  decorticating facets and the transverse processes of L3 down to the sacrum.  We then used fluoroscopy as a guide along with intraoperative landmarks and  drilled a hole for the L3 pedicle screw, tapped, put a pedicle probe down  the pedicle, tapped the pedicle, used a small bone probe, making sure we had  bony edges all the way around, and then placed an Expedium pedicle screw.  A  50 mm screw was used at L3.  A 45 was used at L4, a 40 was used at L5, and a  35 was used at the sacrum. __________ the pedicle of the right side using  fluoroscopy as a guide, tapped it, and checked it with a small probe, and  then placed the screw.  This process was then repeated on the left side.  Two rods were placed over the screw heads, one on the right, one on the  left.  Nuts were placed into the screw heads, and these nuts tightened down.  Final AP and lateral fluoroscopic images were then obtained showing good  position of the pedicle screws of L3-S1, and good position of interbody  cages at L3-4 and L4-5.  The wound was irrigated with antibiotic solution, and then  the rest of the autograft  Symphony bone along with some allograft  Symphony bone was then packed in the posterolateral gutters for  posterolateral fusion of L3-S1 on each side.  We then explored the dura and  nerve roots.  We had a great decompression.  We had good hemostasis, and the  retractors were removed, and the paraspinous muscles were closed with 0  Vicryl interrupted suture, the  fascia closed with 0 Vicryl interrupted  suture, the subcutaneous tissue closed with 0, 2-0, and 3-0 Vicryl  interrupted sutures, and the skin closed with Benzoin and Steri-Strips.  Dressing was placed.  The patient was placed back in the supine position,  awakened from anesthesia, and transferred to the recovery room in stable  condition.      JRH/MEDQ  D:  03/17/2004  T:  03/17/2004  Job:  [id number]

## 2010-07-08 ENCOUNTER — Encounter: Payer: Self-pay | Admitting: Internal Medicine

## 2010-07-09 ENCOUNTER — Ambulatory Visit (INDEPENDENT_AMBULATORY_CARE_PROVIDER_SITE_OTHER): Payer: Medicare Other | Admitting: Internal Medicine

## 2010-07-09 ENCOUNTER — Encounter: Payer: Self-pay | Admitting: Internal Medicine

## 2010-07-09 DIAGNOSIS — D472 Monoclonal gammopathy: Secondary | ICD-10-CM

## 2010-07-09 DIAGNOSIS — M25519 Pain in unspecified shoulder: Secondary | ICD-10-CM

## 2010-07-09 DIAGNOSIS — M1990 Unspecified osteoarthritis, unspecified site: Secondary | ICD-10-CM

## 2010-07-09 DIAGNOSIS — E039 Hypothyroidism, unspecified: Secondary | ICD-10-CM

## 2010-07-09 DIAGNOSIS — M79609 Pain in unspecified limb: Secondary | ICD-10-CM

## 2010-07-09 DIAGNOSIS — M25511 Pain in right shoulder: Secondary | ICD-10-CM

## 2010-07-09 DIAGNOSIS — K219 Gastro-esophageal reflux disease without esophagitis: Secondary | ICD-10-CM

## 2010-07-09 MED ORDER — RANITIDINE HCL 150 MG PO TABS: 150.0000 mg | ORAL_TABLET | Freq: Every day | ORAL | Status: DC

## 2010-07-09 MED ORDER — PREDNISONE 10 MG PO TABS: ORAL_TABLET | ORAL | Status: AC

## 2010-07-09 NOTE — Assessment & Plan Note (Signed)
On Rx 

## 2010-07-09 NOTE — Patient Instructions (Signed)
Come back in 3-4 wks for a shot if not better

## 2010-07-09 NOTE — Assessment & Plan Note (Signed)
Labs reviewed.

## 2010-07-09 NOTE — Progress Notes (Signed)
  Subjective:    Patient ID: Kimberly Merritt, female    DOB: [birth year], 70 y.o.   MRN: [id number]  HPI  The patient is here to follow up on chronic depression, anxiety, headaches and chronic moderate fibromyalgia symptoms controlled with medicines, diet and exercise.  C/o R shoulder pain  Review of Systems  Constitutional: Positive for fatigue. Negative for chills, activity change, appetite change and unexpected weight change.  HENT: Negative for congestion, mouth sores and sinus pressure.   Eyes: Negative for visual disturbance.  Respiratory: Negative for cough and chest tightness.   Gastrointestinal: Negative for nausea and abdominal pain.  Genitourinary: Negative for frequency, difficulty urinating and vaginal pain.  Musculoskeletal: Positive for myalgias, joint swelling, arthralgias and gait problem. Negative for back pain.  Skin: Negative for pallor and rash.  Neurological: Negative for dizziness, tremors, weakness, numbness and headaches.  Psychiatric/Behavioral: Negative for confusion and sleep disturbance.       Objective:   Physical Exam  Constitutional: She appears well-developed and well-nourished. No distress.       NAD  HENT:  Head: Normocephalic.  Right Ear: External ear normal.  Left Ear: External ear normal.  Nose: Nose normal.  Mouth/Throat: Oropharynx is clear and moist.  Eyes: Conjunctivae are normal. Pupils are equal, round, and reactive to light. Right eye exhibits no discharge. Left eye exhibits no discharge.  Neck: Normal range of motion. Neck supple. No JVD present. No tracheal deviation present. No thyromegaly present.  Cardiovascular: Normal rate, regular rhythm and normal heart sounds.   Pulmonary/Chest: No stridor. No respiratory distress. She has no wheezes.  Abdominal: Soft. Bowel sounds are normal. She exhibits no distension and no mass. There is no tenderness. There is no rebound and no guarding.  Musculoskeletal: She exhibits tenderness (R  shoulder). She exhibits no edema.  Lymphadenopathy:    She has no cervical adenopathy.  Neurological: She displays normal reflexes. No cranial nerve deficit. She exhibits normal muscle tone. Coordination normal.  Skin: No rash noted. No erythema.  Psychiatric: She has a normal mood and affect. Her behavior is normal. Judgment and thought content normal.  R subacr and R AC - tender     Ven Duplex - May:  IMPRESSION:  1. No evidence of deep or superficial venous thrombosis in the left  lower extremity or right common femoral vein.  2. Mild left common femoral vein venous insufficiency noted.  3. Left greater saphenous main trunk is not visualized consistent with  previous laser ablation. Compressible small lateral branch was  noted.    Assessment & Plan:

## 2010-07-09 NOTE — Assessment & Plan Note (Signed)
Better  

## 2010-07-09 NOTE — Assessment & Plan Note (Signed)
Try Predn and we will inject AC and subacr if not better

## 2010-07-09 NOTE — Assessment & Plan Note (Signed)
Doing well on Zantac

## 2010-10-08 ENCOUNTER — Ambulatory Visit: Payer: Medicare Other | Admitting: Internal Medicine

## 2010-11-11 ENCOUNTER — Ambulatory Visit (INDEPENDENT_AMBULATORY_CARE_PROVIDER_SITE_OTHER): Payer: Medicare Other | Admitting: Internal Medicine

## 2010-11-11 ENCOUNTER — Encounter: Payer: Self-pay | Admitting: Internal Medicine

## 2010-11-11 DIAGNOSIS — M545 Low back pain, unspecified: Secondary | ICD-10-CM

## 2010-11-11 DIAGNOSIS — J309 Allergic rhinitis, unspecified: Secondary | ICD-10-CM

## 2010-11-11 DIAGNOSIS — D472 Monoclonal gammopathy: Secondary | ICD-10-CM

## 2010-11-11 DIAGNOSIS — I8390 Asymptomatic varicose veins of unspecified lower extremity: Secondary | ICD-10-CM

## 2010-11-11 DIAGNOSIS — M1990 Unspecified osteoarthritis, unspecified site: Secondary | ICD-10-CM

## 2010-11-11 DIAGNOSIS — F329 Major depressive disorder, single episode, unspecified: Secondary | ICD-10-CM

## 2010-11-11 MED ORDER — HYDROCODONE-ACETAMINOPHEN 5-500 MG PO TABS: 1.0000 | ORAL_TABLET | ORAL | Status: DC | PRN

## 2010-11-11 NOTE — Progress Notes (Signed)
  Subjective:    Patient ID: Kimberly Merritt, female    DOB: Sep 27, 1940, 70 y.o.   MRN: [id number]  HPI   The patient is here to follow up on chronic depression, anxiety, headaches and chronic moderate fibromyalgia symptoms controlled partially with medicines, diet and exercise. C/o bleeding veins    Review of Systems  Constitutional: Positive for fatigue. Negative for chills, activity change, appetite change and unexpected weight change.  HENT: Negative for congestion, mouth sores and sinus pressure.   Eyes: Negative for visual disturbance.  Respiratory: Negative for cough and chest tightness.   Gastrointestinal: Negative for nausea and abdominal pain.  Genitourinary: Negative for frequency, difficulty urinating and vaginal pain.  Musculoskeletal: Positive for myalgias and arthralgias. Negative for back pain and gait problem.  Skin: Negative for pallor and rash.  Neurological: Negative for dizziness, tremors, weakness, numbness and headaches.  Psychiatric/Behavioral: Negative for confusion and sleep disturbance. The patient is nervous/anxious.        Objective:   Physical Exam  Constitutional: She appears well-developed and well-nourished. No distress.  HENT:  Head: Normocephalic.  Right Ear: External ear normal.  Left Ear: External ear normal.  Nose: Nose normal.  Mouth/Throat: Oropharynx is clear and moist.  Eyes: Conjunctivae are normal. Pupils are equal, round, and reactive to light. Right eye exhibits no discharge. Left eye exhibits no discharge.  Neck: Normal range of motion. Neck supple. No JVD present. No tracheal deviation present. No thyromegaly present.  Cardiovascular: Normal rate, regular rhythm and normal heart sounds.   Pulmonary/Chest: No stridor. No respiratory distress. She has no wheezes.  Abdominal: Soft. Bowel sounds are normal. She exhibits no distension and no mass. There is no tenderness. There is no rebound and no guarding.  Musculoskeletal: She exhibits  edema (trace) and tenderness.  Lymphadenopathy:    She has no cervical adenopathy.  Neurological: She displays normal reflexes. No cranial nerve deficit. She exhibits normal muscle tone. Coordination normal.  Skin: No rash noted. No erythema.       Spider veins on B LEs  Psychiatric: She has a normal mood and affect. Her behavior is normal. Judgment and thought content normal.   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

## 2010-11-11 NOTE — Assessment & Plan Note (Signed)
Not on rx now; doing ok

## 2010-11-11 NOTE — Assessment & Plan Note (Signed)
Continue with current prescription therapy as reflected on the Med list.  

## 2010-11-11 NOTE — Assessment & Plan Note (Signed)
Monitoring labs 

## 2010-11-11 NOTE — Assessment & Plan Note (Signed)
QR laceration was given prn

## 2010-11-11 NOTE — Patient Instructions (Signed)
Use QR powder if bleeding veins

## 2011-01-11 ENCOUNTER — Other Ambulatory Visit: Payer: Self-pay | Admitting: Orthopaedic Surgery

## 2011-01-11 DIAGNOSIS — M25511 Pain in right shoulder: Secondary | ICD-10-CM

## 2011-01-13 ENCOUNTER — Ambulatory Visit
Admission: RE | Admit: 2011-01-13 | Discharge: 2011-01-13 | Disposition: A | Discharge status: Home / Self Care | Payer: Medicare Other | Source: Ambulatory Visit | Attending: Orthopaedic Surgery | Admitting: Orthopaedic Surgery

## 2011-01-13 DIAGNOSIS — M25511 Pain in right shoulder: Secondary | ICD-10-CM

## 2011-01-14 ENCOUNTER — Other Ambulatory Visit: Payer: Medicare Other

## 2011-02-01 ENCOUNTER — Other Ambulatory Visit: Payer: Self-pay | Admitting: Orthopaedic Surgery

## 2011-02-02 HISTORY — PX: ROTATOR CUFF REPAIR: SHX139

## 2011-02-03 ENCOUNTER — Encounter (HOSPITAL_BASED_OUTPATIENT_CLINIC_OR_DEPARTMENT_OTHER): Payer: Self-pay | Admitting: *Deleted

## 2011-02-03 NOTE — Progress Notes (Signed)
To bring all meds-overnight bag

## 2011-02-07 NOTE — H&P (Signed)
Kimberly Merritt is an 71 y.o. female.   Chief Complaint: R shoulder pain HPI: Pt c/o of increasing right shoulder pain with decreased mobility. MRI recently shows a rct and bicep degeneration. We have have discussed going ahead with a arthroscopy to repair the rct. She has failed conservative treatment.  Past Medical History  Diagnosis Date  . HYPOTHYROIDISM 08/29/2006  . 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 SINUSITIS, ACUTE 01/23/2007  . ALLERGIC RHINITIS 04/25/2007  . GERD 08/29/2006  . HEPATIC CYST 08/02/2007  . OSTEOARTHRITIS 08/29/2006  . LOW BACK PAIN 08/29/2006  . Pain in Soft Tissues of Limb 08/29/2006  . OSTEOPENIA 11/19/2008  . PARESTHESIA 03/05/2009  . LIVER HEMANGIOMA 03/03/2010  . 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 MUSCLE SPASM 03/03/2010  . DEPRESSION 08/29/2006    Past Surgical History  Procedure Date  . Lumbar laminectomy     L3-4, 4-5 and L5-S1  . Tonsillectomy   . Abdominal hysterectomy   . Appendectomy   . Carpal tunnel release     both  . Bunionectomy     both  . Back surgery 2010,2006    lumb fusion  . Cataract extraction     both    Family History  Problem Relation Age of Onset  . Hypertension Other   . Stroke Mother    Social History:  reports that she has never smoked. She does not have any smokeless tobacco history on file. She reports that she does not drink alcohol or use illicit drugs.  Allergies:  Allergies  Allergen Reactions  . Acetaminophen     REACTION: high dose - tremor  . Alendronate Sodium   . Oxybutynin   . Raloxifene   . Sulfonamide Derivatives   . Tramadol Hcl     No current facility-administered medications on file as of .   Medications Prior to Admission  Medication Sig Dispense Refill  . albuterol (PROAIR HFA) 108 (90 BASE) MCG/ACT inhaler Inhale 1-2 puffs into the lungs every 4 (four) hours as needed.        . cetirizine (ZYRTEC) 10 MG tablet Take 10 mg by mouth daily.        . Cholecalciferol (VITAMIN D3) 1000  UNITS CAPS Take by mouth daily.        . fexofenadine (ALLEGRA) 180 MG tablet Take 180 mg by mouth daily.        . fluticasone (FLONASE) 50 MCG/ACT nasal spray 2 sprays by Nasal route daily.  16 g  6  . glucosamine-chondroitin 500-400 MG tablet Take 1 tablet by mouth daily.        Marland Kitchen HYDROcodone-acetaminophen (VICODIN) 5-500 MG per tablet Take 1 tablet by mouth every 4 (four) hours as needed for pain.  120 tablet  2  . levothyroxine (SYNTHROID, LEVOTHROID) 25 MCG tablet Take 25 mcg by mouth daily.        . meloxicam (MOBIC) 7.5 MG tablet Take 7.5 mg by mouth daily.        . methocarbamol (ROBAXIN) 500 MG tablet Take 500 mg by mouth every 6 (six) hours as needed.        . NON FORMULARY Allergy injections       . omeprazole (PRILOSEC) 20 MG capsule Take 20 mg by mouth daily.        . ranitidine (ZANTAC) 150 MG tablet Take 1 tablet (150 mg total) by mouth daily.  180 tablet  3    No results found for this or any previous visit (from  the past 48 hour(s)). No results found.  Review of Systems  Constitutional: Negative.   HENT: Negative.   Eyes: Negative.   Respiratory: Negative.   Cardiovascular: Negative.   Gastrointestinal: Negative.   Genitourinary: Negative.   Musculoskeletal: Negative.   Skin: Negative.   Neurological: Negative.   Endo/Heme/Allergies: Negative.   Psychiatric/Behavioral: Negative.     There were no vitals taken for this visit. Physical Exam  Constitutional: She appears well-developed.  HENT:  Head: Normocephalic and atraumatic.  Eyes: Pupils are equal, round, and reactive to light.  Neck: Normal range of motion. Neck supple.  Cardiovascular: Normal rate and regular rhythm.   Respiratory: Effort normal and breath sounds normal.  GI: Soft. Bowel sounds are normal.  Musculoskeletal:       Right shoulder: 145,60 l1  Painful arc, positive impingement, and some weakness of the rotator cuff  Neurological: She is alert.  Skin: Skin is warm.  Psychiatric: She has a  normal mood and affect.     Assessment/Plan A: Right shoulder rct P: Arthroscopy to repair right shoulder rct  Khaza Blansett R 02/07/2011, 10:40 AM

## 2011-02-09 ENCOUNTER — Encounter (HOSPITAL_BASED_OUTPATIENT_CLINIC_OR_DEPARTMENT_OTHER): Payer: Self-pay | Admitting: Anesthesiology

## 2011-02-09 ENCOUNTER — Ambulatory Visit (HOSPITAL_BASED_OUTPATIENT_CLINIC_OR_DEPARTMENT_OTHER): Payer: Medicare Other | Admitting: Anesthesiology

## 2011-02-09 ENCOUNTER — Ambulatory Visit (HOSPITAL_BASED_OUTPATIENT_CLINIC_OR_DEPARTMENT_OTHER)
Admission: RE | Admit: 2011-02-09 | Discharge: 2011-02-10 | Disposition: A | Discharge status: Home / Self Care | Payer: Medicare Other | Source: Ambulatory Visit | Attending: Orthopaedic Surgery | Admitting: Orthopaedic Surgery

## 2011-02-09 ENCOUNTER — Encounter (HOSPITAL_BASED_OUTPATIENT_CLINIC_OR_DEPARTMENT_OTHER)
Admission: RE | Disposition: A | Discharge status: Home / Self Care | Payer: Self-pay | Source: Ambulatory Visit | Attending: Orthopaedic Surgery

## 2011-02-09 ENCOUNTER — Encounter (HOSPITAL_BASED_OUTPATIENT_CLINIC_OR_DEPARTMENT_OTHER): Payer: Self-pay | Admitting: *Deleted

## 2011-02-09 ENCOUNTER — Encounter (HOSPITAL_BASED_OUTPATIENT_CLINIC_OR_DEPARTMENT_OTHER): Payer: Self-pay | Admitting: Certified Registered"

## 2011-02-09 DIAGNOSIS — F329 Major depressive disorder, single episode, unspecified: Secondary | ICD-10-CM | POA: Insufficient documentation

## 2011-02-09 DIAGNOSIS — K219 Gastro-esophageal reflux disease without esophagitis: Secondary | ICD-10-CM | POA: Insufficient documentation

## 2011-02-09 DIAGNOSIS — M67919 Unspecified disorder of synovium and tendon, unspecified shoulder: Secondary | ICD-10-CM | POA: Insufficient documentation

## 2011-02-09 DIAGNOSIS — M25819 Other specified joint disorders, unspecified shoulder: Secondary | ICD-10-CM | POA: Insufficient documentation

## 2011-02-09 DIAGNOSIS — F3289 Other specified depressive episodes: Secondary | ICD-10-CM | POA: Insufficient documentation

## 2011-02-09 DIAGNOSIS — M75101 Unspecified rotator cuff tear or rupture of right shoulder, not specified as traumatic: Secondary | ICD-10-CM

## 2011-02-09 DIAGNOSIS — M719 Bursopathy, unspecified: Secondary | ICD-10-CM | POA: Insufficient documentation

## 2011-02-09 HISTORY — PX: SHOULDER ARTHROSCOPY: SHX128

## 2011-02-09 LAB — POCT HEMOGLOBIN-HEMACUE: Hemoglobin: 12.8 g/dL (ref 12.0–15.0)

## 2011-02-09 SURGERY — ARTHROSCOPY, SHOULDER
Anesthesia: General | Site: Shoulder | Laterality: Right | Wound class: Clean

## 2011-02-09 MED ORDER — MEPERIDINE HCL 25 MG/ML IJ SOLN
6.2500 mg | INTRAMUSCULAR | Status: DC | PRN
Start: 2011-02-09 — End: 2011-02-09

## 2011-02-09 MED ORDER — METOCLOPRAMIDE HCL 5 MG PO TABS: 5.0000 mg | ORAL_TABLET | Freq: Three times a day (TID) | ORAL | Status: DC | PRN

## 2011-02-09 MED ORDER — LACTATED RINGERS IV SOLN: INTRAVENOUS | Status: DC

## 2011-02-09 MED ORDER — CEFAZOLIN SODIUM 1-5 GM-% IV SOLN
1.0000 g | Freq: Four times a day (QID) | INTRAVENOUS | Status: DC
  Administered 2011-02-09 – 2011-02-10 (×2): 1 g via INTRAVENOUS

## 2011-02-09 MED ORDER — MIDAZOLAM HCL 2 MG/2ML IJ SOLN: 1.0000 mg | INTRAMUSCULAR | Status: DC | PRN

## 2011-02-09 MED ORDER — FENTANYL CITRATE 0.05 MG/ML IJ SOLN: 50.0000 ug | INTRAMUSCULAR | Status: DC | PRN

## 2011-02-09 MED ORDER — DEXAMETHASONE SODIUM PHOSPHATE 4 MG/ML IJ SOLN
INTRAMUSCULAR | Status: DC | PRN
  Administered 2011-02-09: 4 mg via INTRAVENOUS

## 2011-02-09 MED ORDER — FENTANYL CITRATE 0.05 MG/ML IJ SOLN
INTRAMUSCULAR | Status: DC | PRN
  Administered 2011-02-09 (×2): 50 ug via INTRAVENOUS

## 2011-02-09 MED ORDER — ONDANSETRON HCL 4 MG/2ML IJ SOLN: 4.0000 mg | Freq: Four times a day (QID) | INTRAMUSCULAR | Status: DC | PRN

## 2011-02-09 MED ORDER — ONDANSETRON HCL 4 MG/2ML IJ SOLN: 4.0000 mg | Freq: Once | INTRAMUSCULAR | Status: DC | PRN

## 2011-02-09 MED ORDER — SODIUM CHLORIDE 0.9 % IR SOLN
Status: DC | PRN
  Administered 2011-02-09: 3000 mL

## 2011-02-09 MED ORDER — METHOCARBAMOL 500 MG PO TABS
500.0000 mg | ORAL_TABLET | Freq: Three times a day (TID) | ORAL | Status: DC
  Administered 2011-02-09 – 2011-02-10 (×3): 500 mg via ORAL

## 2011-02-09 MED ORDER — LACTATED RINGERS IV SOLN
INTRAVENOUS | Status: DC
  Administered 2011-02-09 (×3): via INTRAVENOUS

## 2011-02-09 MED ORDER — METOCLOPRAMIDE HCL 5 MG/ML IJ SOLN: 5.0000 mg | Freq: Three times a day (TID) | INTRAMUSCULAR | Status: DC | PRN

## 2011-02-09 MED ORDER — HYDROCODONE-ACETAMINOPHEN 5-500 MG PO TABS: 1.0000 | ORAL_TABLET | Freq: Four times a day (QID) | ORAL | Status: DC | PRN

## 2011-02-09 MED ORDER — ONDANSETRON HCL 4 MG PO TABS: 4.0000 mg | ORAL_TABLET | Freq: Four times a day (QID) | ORAL | Status: DC | PRN

## 2011-02-09 MED ORDER — BUPIVACAINE-EPINEPHRINE 0.5% -1:200000 IJ SOLN
INTRAMUSCULAR | Status: DC | PRN
  Administered 2011-02-09: 20 mL

## 2011-02-09 MED ORDER — LEVOTHYROXINE SODIUM 25 MCG PO TABS: 25.0000 ug | ORAL_TABLET | Freq: Every day | ORAL | Status: DC

## 2011-02-09 MED ORDER — LACTATED RINGERS IV SOLN
INTRAVENOUS | Status: DC
  Administered 2011-02-09: 19:00:00 via INTRAVENOUS

## 2011-02-09 MED ORDER — ALBUTEROL SULFATE HFA 108 (90 BASE) MCG/ACT IN AERS: 1.0000 | INHALATION_SPRAY | Freq: Four times a day (QID) | RESPIRATORY_TRACT | Status: DC

## 2011-02-09 MED ORDER — FAMOTIDINE 10 MG PO TABS: 10.0000 mg | ORAL_TABLET | Freq: Every day | ORAL | Status: DC

## 2011-02-09 MED ORDER — MORPHINE SULFATE 2 MG/ML IJ SOLN: 0.0500 mg/kg | INTRAMUSCULAR | Status: DC | PRN

## 2011-02-09 MED ORDER — ONDANSETRON HCL 4 MG/2ML IJ SOLN
INTRAMUSCULAR | Status: DC | PRN
  Administered 2011-02-09: 4 mg via INTRAVENOUS

## 2011-02-09 MED ORDER — CHLORHEXIDINE GLUCONATE 4 % EX LIQD: 60.0000 mL | Freq: Once | CUTANEOUS | Status: DC

## 2011-02-09 MED ORDER — HYDROMORPHONE HCL PF 1 MG/ML IJ SOLN
0.5000 mg | INTRAMUSCULAR | Status: DC | PRN
  Administered 2011-02-09: 0.5 mg via INTRAVENOUS
  Administered 2011-02-09: 1 mg via INTRAVENOUS
  Administered 2011-02-09: 0.5 mg via INTRAVENOUS

## 2011-02-09 MED ORDER — HYDROMORPHONE HCL PF 1 MG/ML IJ SOLN
0.2500 mg | INTRAMUSCULAR | Status: DC | PRN
  Administered 2011-02-09 (×4): 0.25 mg via INTRAVENOUS

## 2011-02-09 MED ORDER — HYDROCODONE-ACETAMINOPHEN 5-325 MG PO TABS
1.0000 | ORAL_TABLET | ORAL | Status: DC | PRN
  Administered 2011-02-09 (×3): 2 via ORAL
  Administered 2011-02-10: 1 via ORAL
  Administered 2011-02-10: 2 via ORAL

## 2011-02-09 MED ORDER — LIDOCAINE HCL (CARDIAC) 20 MG/ML IV SOLN
INTRAVENOUS | Status: DC | PRN
  Administered 2011-02-09: 80 mg via INTRAVENOUS

## 2011-02-09 MED ORDER — CEFAZOLIN SODIUM 1-5 GM-% IV SOLN
1.0000 g | INTRAVENOUS | Status: AC
  Administered 2011-02-09: 1 g via INTRAVENOUS

## 2011-02-09 MED ORDER — PANTOPRAZOLE SODIUM 40 MG PO TBEC: 40.0000 mg | DELAYED_RELEASE_TABLET | Freq: Every day | ORAL | Status: DC

## 2011-02-09 MED ORDER — MIDAZOLAM HCL 5 MG/5ML IJ SOLN
INTRAMUSCULAR | Status: DC | PRN
  Administered 2011-02-09: 1 mg via INTRAVENOUS

## 2011-02-09 MED ORDER — ZOLPIDEM TARTRATE 5 MG PO TABS: 5.0000 mg | ORAL_TABLET | Freq: Every evening | ORAL | Status: DC | PRN

## 2011-02-09 MED ORDER — SUCCINYLCHOLINE CHLORIDE 20 MG/ML IJ SOLN
INTRAMUSCULAR | Status: DC | PRN
  Administered 2011-02-09: 100 mg via INTRAVENOUS

## 2011-02-09 MED ORDER — FLUTICASONE PROPIONATE 50 MCG/ACT NA SUSP: 1.0000 | Freq: Every day | NASAL | Status: DC

## 2011-02-09 MED ORDER — PROPOFOL 10 MG/ML IV EMUL
INTRAVENOUS | Status: DC | PRN
  Administered 2011-02-09: 150 mg via INTRAVENOUS

## 2011-02-09 SURGICAL SUPPLY — 69 items
BENZOIN TINCTURE PRP APPL 2/3 (GAUZE/BANDAGES/DRESSINGS) IMPLANT
BLADE CUDA 5.5 (BLADE) IMPLANT
BLADE GREAT WHITE 4.2 (BLADE) ×2 IMPLANT
BLADE SURG 15 STRL LF DISP TIS (BLADE) IMPLANT
BLADE SURG 15 STRL SS (BLADE)
BUR VERTEX HOODED 4.5 (BURR) ×2 IMPLANT
CANISTER OMNI JUG 16 LITER (MISCELLANEOUS) ×2 IMPLANT
CANISTER SUCTION 2500CC (MISCELLANEOUS) IMPLANT
CANNULA SHOULDER 7CM (CANNULA) ×2 IMPLANT
CANNULA TWIST IN 8.25X7CM (CANNULA) ×2 IMPLANT
DECANTER SPIKE VIAL GLASS SM (MISCELLANEOUS) IMPLANT
DERMABOND ADVANCED (GAUZE/BANDAGES/DRESSINGS)
DERMABOND ADVANCED .7 DNX12 (GAUZE/BANDAGES/DRESSINGS) IMPLANT
DRAPE STERI 35X30 U-POUCH (DRAPES) ×2 IMPLANT
DRAPE U-SHAPE 47X51 STRL (DRAPES) ×2 IMPLANT
DRAPE U-SHAPE 76X120 STRL (DRAPES) ×4 IMPLANT
DRSG EMULSION OIL 3X3 NADH (GAUZE/BANDAGES/DRESSINGS) ×2 IMPLANT
DRSG PAD ABDOMINAL 8X10 ST (GAUZE/BANDAGES/DRESSINGS) ×2 IMPLANT
DURAPREP 26ML APPLICATOR (WOUND CARE) ×2 IMPLANT
ELECT MENISCUS 165MM 90D (ELECTRODE) IMPLANT
ELECT REM PT RETURN 9FT ADLT (ELECTROSURGICAL) ×2
ELECTRODE REM PT RTRN 9FT ADLT (ELECTROSURGICAL) ×1 IMPLANT
GLOVE BIO SURGEON STRL SZ8.5 (GLOVE) ×2 IMPLANT
GLOVE BIOGEL PI IND STRL 8 (GLOVE) ×1 IMPLANT
GLOVE BIOGEL PI IND STRL 8.5 (GLOVE) ×1 IMPLANT
GLOVE BIOGEL PI INDICATOR 8 (GLOVE) ×1
GLOVE BIOGEL PI INDICATOR 8.5 (GLOVE) ×1
GLOVE SS BIOGEL STRL SZ 8 (GLOVE) ×1 IMPLANT
GLOVE SUPERSENSE BIOGEL SZ 8 (GLOVE) ×1
GOWN PREVENTION PLUS XLARGE (GOWN DISPOSABLE) ×4 IMPLANT
GOWN PREVENTION PLUS XXLARGE (GOWN DISPOSABLE) ×2 IMPLANT
NDL SUT 6 .5 CRC .975X.05 MAYO (NEEDLE) IMPLANT
NEEDLE MAYO TAPER (NEEDLE)
NEEDLE SCORPION MULTI FIRE (NEEDLE) ×2 IMPLANT
NS IRRIG 1000ML POUR BTL (IV SOLUTION) IMPLANT
PACK ARTHROSCOPY DSU (CUSTOM PROCEDURE TRAY) ×2 IMPLANT
PACK BASIN DAY SURGERY FS (CUSTOM PROCEDURE TRAY) ×2 IMPLANT
PASSER SUT SWANSON 36MM LOOP (INSTRUMENTS) IMPLANT
PENCIL BUTTON HOLSTER BLD 10FT (ELECTRODE) IMPLANT
PUSHLOCK PEEK 4.5X24 (Orthopedic Implant) ×4 IMPLANT
SET ARTHROSCOPY TUBING (MISCELLANEOUS) ×1
SET ARTHROSCOPY TUBING LN (MISCELLANEOUS) ×1 IMPLANT
SLEEVE SCD COMPRESS KNEE MED (MISCELLANEOUS) IMPLANT
SLING ARM FOAM STRAP LRG (SOFTGOODS) IMPLANT
SLING ARM FOAM STRAP MED (SOFTGOODS) ×2 IMPLANT
SLING ARM FOAM STRAP SML (SOFTGOODS) IMPLANT
SLING ARM FOAM STRAP XLG (SOFTGOODS) IMPLANT
SPONGE GAUZE 4X4 12PLY (GAUZE/BANDAGES/DRESSINGS) ×2 IMPLANT
SPONGE GAUZE 4X4 16PLY NONSTR (GAUZE/BANDAGES/DRESSINGS) ×2 IMPLANT
SPONGE LAP 4X18 X RAY DECT (DISPOSABLE) IMPLANT
STRIP CLOSURE SKIN 1/2X4 (GAUZE/BANDAGES/DRESSINGS) IMPLANT
SUCTION FRAZIER TIP 10 FR DISP (SUCTIONS) IMPLANT
SUT ETHIBOND 2 OS 4 DA (SUTURE) IMPLANT
SUT ETHILON 3 0 PS 1 (SUTURE) IMPLANT
SUT ETHILON 4 0 PS 2 18 (SUTURE) ×2 IMPLANT
SUT FIBERWIRE #2 38 T-5 BLUE (SUTURE)
SUT PDS AB 2-0 CT2 27 (SUTURE) IMPLANT
SUT VIC AB 0 SH 27 (SUTURE) IMPLANT
SUT VIC AB 2-0 SH 27 (SUTURE)
SUT VIC AB 2-0 SH 27XBRD (SUTURE) IMPLANT
SUT VICRYL 4-0 PS2 18IN ABS (SUTURE) IMPLANT
SUTURE FIBERWR #2 38 T-5 BLUE (SUTURE) IMPLANT
SYR BULB 3OZ (MISCELLANEOUS) IMPLANT
TAPE CLOTH SURG 6X10 WHT LF (GAUZE/BANDAGES/DRESSINGS) ×2 IMPLANT
TOWEL OR 17X24 6PK STRL BLUE (TOWEL DISPOSABLE) ×2 IMPLANT
TOWEL OR NON WOVEN STRL DISP B (DISPOSABLE) ×2 IMPLANT
WAND STAR VAC 90 (SURGICAL WAND) ×2 IMPLANT
WATER STERILE IRR 1000ML POUR (IV SOLUTION) ×2 IMPLANT
YANKAUER SUCT BULB TIP NO VENT (SUCTIONS) IMPLANT

## 2011-02-09 NOTE — Anesthesia Preprocedure Evaluation (Addendum)
Anesthesia Evaluation  Patient identified by MRN, date of birth, ID band Patient awake    Reviewed: Allergy & Precautions, H&P , NPO status , Patient's Chart, lab work & pertinent test results  Airway Mallampati: I TM Distance: >3 FB Neck ROM: Full    Dental  (+) Teeth Intact and Dental Advisory Given   Pulmonary  clear to auscultation        Cardiovascular Regular Normal    Neuro/Psych    GI/Hepatic   Endo/Other    Renal/GU      Musculoskeletal   Abdominal   Peds  Hematology   Anesthesia Other Findings   Reproductive/Obstetrics                           Anesthesia Physical Anesthesia Plan  ASA: II  Anesthesia Plan: General   Post-op Pain Management:    Induction: Intravenous  Airway Management Planned: Oral ETT  Additional Equipment:   Intra-op Plan:   Post-operative Plan: Extubation in OR  Informed Consent: I have reviewed the patients History and Physical, chart, labs and discussed the procedure including the risks, benefits and alternatives for the proposed anesthesia with the patient or authorized representative who has indicated his/her understanding and acceptance.   Dental advisory given  Plan Discussed with: CRNA, Anesthesiologist and Surgeon  Anesthesia Plan Comments: (Pt declined ISB for fear of nerve damage)        Anesthesia Quick Evaluation

## 2011-02-09 NOTE — Anesthesia Procedure Notes (Addendum)
Procedure Name: Intubation Date/Time: 02/09/2011 11:43 AM Performed by: Radford Pax Pre-anesthesia Checklist: Patient identified, Timeout performed, Emergency Drugs available, Suction available and Patient being monitored Patient Re-evaluated:Patient Re-evaluated prior to inductionOxygen Delivery Method: Circle System Utilized Preoxygenation: Pre-oxygenation with 100% oxygen Intubation Type: IV induction Ventilation: Mask ventilation without difficulty Laryngoscope Size: Miller and 3 Grade View: Grade I Tube type: Oral Number of attempts: 1 Placement Confirmation: ETT inserted through vocal cords under direct vision,  breath sounds checked- equal and bilateral and positive ETCO2 Secured at: 22 cm Tube secured with: Tape Dental Injury: Teeth and Oropharynx as per pre-operative assessment

## 2011-02-09 NOTE — Anesthesia Postprocedure Evaluation (Signed)
  Anesthesia Post-op Note  Patient: Kimberly Merritt  Procedure(s) Performed:  ARTHROSCOPY SHOULDER - right shoulder arthroscopy subacromial decompression with rotator cuff repair  Patient Location: PACU  Anesthesia Type: General  Level of Consciousness: awake, alert  and oriented  Airway and Oxygen Therapy: Patient Spontanous Breathing and Patient connected to face mask oxygen  Post-op Pain: mild  Post-op Assessment: Post-op Vital signs reviewed, Patient's Cardiovascular Status Stable, Respiratory Function Stable, Patent Airway, No signs of Nausea or vomiting and Pain level controlled  Post-op Vital Signs: Reviewed and stable  Complications: No apparent anesthesia complications

## 2011-02-09 NOTE — Brief Op Note (Signed)
Adisen Bennion Blunt [id number] 02/09/2011   PRE-OP DIAGNOSIS: right RCT and impingement and biceps tendonopathy  POST-OP DIAGNOSIS: same  PROCEDURE: right sh scope with aply and RCR  ANESTHESIA: general  Lameeka Schleifer G   Dictation #:  [id number]

## 2011-02-09 NOTE — Interval H&P Note (Signed)
History and Physical Interval Note:  02/09/2011 11:18 AM  Kimberly Merritt  has presented today for surgery, with the diagnosis of impingement, rc tear right shoulder  The various methods of treatment have been discussed with the patient and family. After consideration of risks, benefits and other options for treatment, the patient has consented to  Procedure(s): ARTHROSCOPY SHOULDER as a surgical intervention .  The patients' history has been reviewed, patient examined, no change in status, stable for surgery.  I have reviewed the patients' chart and labs.  Questions were answered to the patient's satisfaction.     Aubree Doody G

## 2011-02-09 NOTE — Transfer of Care (Signed)
Immediate Anesthesia Transfer of Care Note  Patient: Kimberly Merritt  Procedure(s) Performed:  ARTHROSCOPY SHOULDER - right shoulder arthroscopy subacromial decompression with rotator cuff repair  Patient Location: PACU  Anesthesia Type: General  Level of Consciousness: sedated and patient cooperative  Airway & Oxygen Therapy: Patient Spontanous Breathing and Patient connected to face mask oxygen  Post-op Assessment: Report given to PACU RN and Post -op Vital signs reviewed and stable  Post vital signs: Reviewed and stable  Complications: No apparent anesthesia complications

## 2011-02-10 ENCOUNTER — Encounter (HOSPITAL_BASED_OUTPATIENT_CLINIC_OR_DEPARTMENT_OTHER): Payer: Self-pay | Admitting: Orthopaedic Surgery

## 2011-02-10 NOTE — Op Note (Signed)
NAMECIENNA, DUMAIS NO.:  [id number]  MEDICAL RECORD NO.:  [id number]  LOCATION:                                 FACILITY:  PHYSICIAN:  Lubertha Basque. Jerl Santos, M.D.     DATE OF BIRTH:  DATE OF PROCEDURE:  02/09/2011 DATE OF DISCHARGE:                              OPERATIVE REPORT   PREOPERATIVE DIAGNOSES: 1. Right shoulder impingement. 2. Right shoulder biceps tendinopathy. 3. Right shoulder rotator cuff tear.  POSTOPERATIVE DIAGNOSIS: 1. Right shoulder impingement. 2. Right shoulder biceps tendinopathy. 3. Right shoulder rotator cuff tear.  PROCEDURES: 1. Right shoulder arthroscopic acromioplasty. 2. Right shoulder arthroscopic debridement. 3. Right shoulder arthroscopic rotator cuff repair.  ANESTHESIA:  General.  ATTENDING SURGEON:  Lubertha Basque. Jerl Santos, M.D.  ASSISTANT:  Lindwood Qua, P.A.  INDICATIONS FOR PROCEDURE:  The patient is a 71 year old woman with a long history of the painful right shoulder.  This has persisted despite oral anti-inflammatories, and an exercise program.  She has been injected on 3 occasions with transient relief with the first two.  By MRI scan, she has a repairable rotator cuff tear along with some advanced biceps tendinopathy.  She is offered an arthroscopy at this point.  Informed operative consent was obtained after discussion of possible complications including reaction to anesthesia and infection.  SUMMARY OF FINDINGS AND PROCEDURE:  Under general anesthesia, an arthroscopy of the right shoulder was performed.  The glenohumeral joint showed some mild degenerative change, grade 2 on both aspects.  The biceps tendon was already torn and absent from the joint.  A brief debridement was required.  Rotator cuff tear was easily seen from below and confirmed in the subacromial space.  This was about a cm and a half in size and was of fairly poor tissue.  We repaired with 2 reverse mattress sutures of FiberWire, secured  with push lock anchors by Arthrex.  I did perform an acromioplasty as well.  Lindwood Qua assisted throughout and was invaluable to the completion of the case, and that he helped pass instruments to make this possible in an arthroscopic fashion.  DESCRIPTION OF PROCEDURE:  The patient was taken to the operative suite where general anesthetic was applied without difficulty.  She was positioned in beach-chair position and prepped and draped normal sterile fashion.  After administration of preop IV Kefzol and arthroscopy, the right shoulder was performed through a total of 3 portals.  Findings were as noted above.  Procedure consisted initially of the debridement of the biceps stump and then we performed an acromioplasty with the bur in the lateral position followed by transfer of the bur to the posterior position.  I debrided the rotator cuff tear and removed some devitalized tissue.  I created a bleeding bed of bone with a bur.  We then placed 2 reverse mattress sutures of FiberWire in the appropriate position, using these sutures reapproximated the rotator cuff to the bleeding bed of bone, anchored with 2 push locks by Arthrex.  This seemed to give [place] a nice tight repair.  The shoulder was thoroughly irrigated followed by placement of Marcaine with epinephrine and morphine.  Simple sutures of nylon  were used to loosely reapproximate the portals followed by Adaptic, dry gauze, and tape.  Estimated blood loss and her fluids obtained from anesthesia records.  DISPOSITION:  The patient was extubated in the operating room and taken to recovery in stable addition.  She was to be admitted for overnight observation for pain control with probable discharge home in the morning.     Lubertha Basque Jerl Santos, M.D.     PGD/MEDQ  D:  02/09/2011  T:  02/10/2011  Job:  [id number]

## 2011-02-15 ENCOUNTER — Encounter: Payer: Self-pay | Admitting: Internal Medicine

## 2011-02-15 ENCOUNTER — Ambulatory Visit (INDEPENDENT_AMBULATORY_CARE_PROVIDER_SITE_OTHER): Payer: Medicare Other | Admitting: Internal Medicine

## 2011-02-15 VITALS — BP 144/78 | HR 76 | Temp 98.5°F | Resp 16

## 2011-02-15 DIAGNOSIS — M25519 Pain in unspecified shoulder: Secondary | ICD-10-CM

## 2011-02-15 DIAGNOSIS — J0110 Acute frontal sinusitis, unspecified: Secondary | ICD-10-CM | POA: Insufficient documentation

## 2011-02-15 DIAGNOSIS — M25511 Pain in right shoulder: Secondary | ICD-10-CM

## 2011-02-15 DIAGNOSIS — E039 Hypothyroidism, unspecified: Secondary | ICD-10-CM

## 2011-02-15 DIAGNOSIS — J019 Acute sinusitis, unspecified: Secondary | ICD-10-CM

## 2011-02-15 MED ORDER — AMOXICILLIN 500 MG PO CAPS: 1000.0000 mg | ORAL_CAPSULE | Freq: Two times a day (BID) | ORAL | Status: DC

## 2011-02-15 MED ORDER — SYNTHROID 25 MCG PO TABS: 25.0000 ug | ORAL_TABLET | Freq: Every day | ORAL | Status: DC

## 2011-02-15 NOTE — Assessment & Plan Note (Signed)
Amoxicillin x10d 

## 2011-02-15 NOTE — Progress Notes (Signed)
  Subjective:    Patient ID: Kimberly Merritt, female    DOB: 03/01/1940, 71 y.o.   MRN: [id number]  HPI C/o URI x 1 wk F/u R sh pain F/u hypothyroidism   Review of Systems  Constitutional: Positive for chills. Negative for activity change, appetite change, fatigue and unexpected weight change.  HENT: Positive for congestion, rhinorrhea and postnasal drip. Negative for mouth sores and sinus pressure.   Eyes: Negative for visual disturbance.  Respiratory: Positive for cough. Negative for chest tightness.   Gastrointestinal: Negative for nausea and abdominal pain.  Genitourinary: Negative for frequency, difficulty urinating and vaginal pain.  Musculoskeletal: Negative for back pain and gait problem.  Skin: Negative for pallor and rash.  Neurological: Negative for dizziness, tremors, weakness, numbness and headaches.  Psychiatric/Behavioral: Negative for confusion and sleep disturbance.       Objective:   Physical Exam  Constitutional: She appears well-developed. No distress.  HENT:  Head: Normocephalic.  Right Ear: External ear normal.  Left Ear: External ear normal.  Nose: Nose normal.       eryth throat  Eyes: Conjunctivae are normal. Pupils are equal, round, and reactive to light. Right eye exhibits no discharge. Left eye exhibits no discharge.  Neck: Normal range of motion. Neck supple. No JVD present. No tracheal deviation present. No thyromegaly present.  Cardiovascular: Normal rate, regular rhythm and normal heart sounds.   Pulmonary/Chest: No stridor. No respiratory distress. She has no wheezes.  Abdominal: Soft. Bowel sounds are normal. She exhibits no distension and no mass. There is no tenderness. There is no rebound and no guarding.  Musculoskeletal: She exhibits tenderness (R shoulder is in a sling). She exhibits no edema.  Lymphadenopathy:    She has no cervical adenopathy.  Neurological: She displays normal reflexes. No cranial nerve deficit. She exhibits normal muscle  tone. Coordination normal.  Skin: No rash noted. No erythema.  Psychiatric: She has a normal mood and affect. Her behavior is normal. Judgment and thought content normal.    R arm is in a sling Eryth nasal mucosa      Assessment & Plan:

## 2011-02-15 NOTE — Assessment & Plan Note (Signed)
Refilled her meds. 

## 2011-02-15 NOTE — Assessment & Plan Note (Signed)
2012 R AC and subacr pain - s/p rot cuff repair 1/13

## 2011-02-17 ENCOUNTER — Ambulatory Visit: Payer: Medicare Other | Admitting: Internal Medicine

## 2011-05-10 ENCOUNTER — Ambulatory Visit: Payer: Medicare Other | Admitting: Internal Medicine

## 2011-05-28 ENCOUNTER — Encounter: Payer: Self-pay | Admitting: Internal Medicine

## 2011-05-28 ENCOUNTER — Ambulatory Visit (INDEPENDENT_AMBULATORY_CARE_PROVIDER_SITE_OTHER): Payer: Medicare Other | Admitting: Internal Medicine

## 2011-05-28 VITALS — BP 126/80 | HR 84 | Temp 98.2°F | Resp 16 | Wt 128.0 lb

## 2011-05-28 DIAGNOSIS — M545 Low back pain, unspecified: Secondary | ICD-10-CM

## 2011-05-28 DIAGNOSIS — M1990 Unspecified osteoarthritis, unspecified site: Secondary | ICD-10-CM

## 2011-05-28 DIAGNOSIS — J309 Allergic rhinitis, unspecified: Secondary | ICD-10-CM

## 2011-05-28 DIAGNOSIS — D472 Monoclonal gammopathy: Secondary | ICD-10-CM

## 2011-05-28 DIAGNOSIS — Z0000 Encounter for general adult medical examination without abnormal findings: Secondary | ICD-10-CM

## 2011-05-28 DIAGNOSIS — F329 Major depressive disorder, single episode, unspecified: Secondary | ICD-10-CM

## 2011-05-28 DIAGNOSIS — R202 Paresthesia of skin: Secondary | ICD-10-CM

## 2011-05-28 DIAGNOSIS — E559 Vitamin D deficiency, unspecified: Secondary | ICD-10-CM

## 2011-05-28 DIAGNOSIS — E039 Hypothyroidism, unspecified: Secondary | ICD-10-CM

## 2011-05-28 DIAGNOSIS — R209 Unspecified disturbances of skin sensation: Secondary | ICD-10-CM

## 2011-05-28 NOTE — Assessment & Plan Note (Signed)
Continue with current prescription therapy as reflected on the Med list.  

## 2011-05-28 NOTE — Progress Notes (Signed)
Patient ID: Kimberly Merritt, female   DOB: 08/19/1940, 71 y.o.   MRN: [id number]  Subjective:    Patient ID: Kimberly Merritt, female    DOB: 02-08-1940, 71 y.o.   MRN: [id number]  HPI   The patient is here to follow up on chronic allergies depression, anxiety, headaches and chronic moderate fibromyalgia and OA symptoms controlled partially with medicines, diet and exercise.  BP Readings from Last 3 Encounters:  05/28/11 126/80  02/15/11 144/78  02/10/11 120/78   Wt Readings from Last 3 Encounters:  05/28/11 128 lb (58.06 kg)  02/09/11 130 lb (58.968 kg)  02/09/11 130 lb (58.968 kg)       Review of Systems  Constitutional: Positive for fatigue. Negative for chills, activity change, appetite change and unexpected weight change.  HENT: Negative for congestion, mouth sores and sinus pressure.   Eyes: Negative for visual disturbance.  Respiratory: Negative for cough and chest tightness.   Gastrointestinal: Negative for nausea and abdominal pain.  Genitourinary: Negative for frequency, difficulty urinating and vaginal pain.  Musculoskeletal: Positive for myalgias and arthralgias. Negative for back pain and gait problem.  Skin: Negative for pallor and rash.  Neurological: Negative for dizziness, tremors, weakness, numbness and headaches.  Psychiatric/Behavioral: Negative for confusion and sleep disturbance. The patient is nervous/anxious.        Objective:   Physical Exam  Constitutional: She appears well-developed and well-nourished. No distress.  HENT:  Head: Normocephalic.  Right Ear: External ear normal.  Left Ear: External ear normal.  Nose: Nose normal.  Mouth/Throat: Oropharynx is clear and moist.  Eyes: Conjunctivae are normal. Pupils are equal, round, and reactive to light. Right eye exhibits no discharge. Left eye exhibits no discharge.  Neck: Normal range of motion. Neck supple. No JVD present. No tracheal deviation present. No thyromegaly present.  Cardiovascular: Normal  rate, regular rhythm and normal heart sounds.   Pulmonary/Chest: No stridor. No respiratory distress. She has no wheezes.  Abdominal: Soft. Bowel sounds are normal. She exhibits no distension and no mass. There is no tenderness. There is no rebound and no guarding.  Musculoskeletal: She exhibits edema (trace) and tenderness.  Lymphadenopathy:    She has no cervical adenopathy.  Neurological: She displays normal reflexes. No cranial nerve deficit. She exhibits normal muscle tone. Coordination normal.  Skin: No rash noted. No erythema.       Spider veins on B LEs  Psychiatric: She has a normal mood and affect. Her behavior is normal. Judgment and thought content normal.   OA changes in hands  Lab Results  Component Value Date   WBC 7.8 05/26/2010   HGB 12.8 02/09/2011   HCT 38.1 05/26/2010   PLT 389.0 05/26/2010   GLUCOSE 100* 05/26/2010   CHOL 197 02/24/2010   TRIG 60.0 02/24/2010   HDL 69.30 02/24/2010   LDLDIRECT 131.0 02/05/2008   LDLCALC 116* 02/24/2010   ALT 22 05/26/2010   AST 24 05/26/2010   NA 139 05/26/2010   K 5.2* 05/26/2010   CL 103 05/26/2010   CREATININE 0.9 05/26/2010   BUN 25* 05/26/2010   CO2 30 05/26/2010   TSH 0.85 05/26/2010   INR 1.0 04/19/2008         Assessment & Plan:

## 2011-05-28 NOTE — Assessment & Plan Note (Signed)
Will recheck labs 

## 2011-05-28 NOTE — Assessment & Plan Note (Signed)
Not on rx Discussed

## 2011-05-28 NOTE — Assessment & Plan Note (Signed)
Inflamatory OA, mostly hands  Potential benefits of a long term opioids use as well as potential risks (i.e. addiction risk, apnea etc) and complications (i.e. Somnolence, constipation and others) were explained to the patient and were aknowledged.   Potential benefits of a long/short term steroid  use as well as potential risks  and complications were explained to the patient and were aknowledged. Continue with current prescription therapy as reflected on the Med list.

## 2011-05-31 ENCOUNTER — Telehealth: Payer: Self-pay

## 2011-05-31 DIAGNOSIS — Z124 Encounter for screening for malignant neoplasm of cervix: Secondary | ICD-10-CM

## 2011-05-31 NOTE — Telephone Encounter (Signed)
Pt called requesting a referral to a GYN for yearly exam. Pt prefers a female GYN

## 2011-06-01 ENCOUNTER — Other Ambulatory Visit: Payer: Self-pay | Admitting: Gynecology

## 2011-06-01 DIAGNOSIS — Z1231 Encounter for screening mammogram for malignant neoplasm of breast: Secondary | ICD-10-CM

## 2011-06-01 NOTE — Telephone Encounter (Signed)
Ok will sch Thx 

## 2011-06-04 ENCOUNTER — Ambulatory Visit
Admission: RE | Admit: 2011-06-04 | Discharge: 2011-06-04 | Disposition: A | Discharge status: Home / Self Care | Payer: Medicare Other | Source: Ambulatory Visit | Attending: Gynecology | Admitting: Gynecology

## 2011-06-04 ENCOUNTER — Ambulatory Visit: Payer: Medicare Other | Admitting: Gynecology

## 2011-06-04 DIAGNOSIS — Z1231 Encounter for screening mammogram for malignant neoplasm of breast: Secondary | ICD-10-CM

## 2011-06-08 ENCOUNTER — Telehealth: Payer: Self-pay

## 2011-06-08 ENCOUNTER — Encounter: Payer: Self-pay | Admitting: Gynecology

## 2011-06-08 ENCOUNTER — Ambulatory Visit (INDEPENDENT_AMBULATORY_CARE_PROVIDER_SITE_OTHER): Payer: Medicare Other | Admitting: Gynecology

## 2011-06-08 ENCOUNTER — Other Ambulatory Visit (INDEPENDENT_AMBULATORY_CARE_PROVIDER_SITE_OTHER): Payer: Medicare Other

## 2011-06-08 VITALS — BP 132/78 | Ht 63.0 in | Wt 129.0 lb

## 2011-06-08 DIAGNOSIS — Z780 Asymptomatic menopausal state: Secondary | ICD-10-CM

## 2011-06-08 DIAGNOSIS — M545 Low back pain, unspecified: Secondary | ICD-10-CM

## 2011-06-08 DIAGNOSIS — R202 Paresthesia of skin: Secondary | ICD-10-CM

## 2011-06-08 DIAGNOSIS — D472 Monoclonal gammopathy: Secondary | ICD-10-CM

## 2011-06-08 DIAGNOSIS — N952 Postmenopausal atrophic vaginitis: Secondary | ICD-10-CM

## 2011-06-08 DIAGNOSIS — E039 Hypothyroidism, unspecified: Secondary | ICD-10-CM

## 2011-06-08 DIAGNOSIS — M899 Disorder of bone, unspecified: Secondary | ICD-10-CM

## 2011-06-08 DIAGNOSIS — Z0000 Encounter for general adult medical examination without abnormal findings: Secondary | ICD-10-CM

## 2011-06-08 DIAGNOSIS — J309 Allergic rhinitis, unspecified: Secondary | ICD-10-CM

## 2011-06-08 DIAGNOSIS — Z79899 Other long term (current) drug therapy: Secondary | ICD-10-CM

## 2011-06-08 DIAGNOSIS — F329 Major depressive disorder, single episode, unspecified: Secondary | ICD-10-CM

## 2011-06-08 DIAGNOSIS — J069 Acute upper respiratory infection, unspecified: Secondary | ICD-10-CM

## 2011-06-08 DIAGNOSIS — M8580 Other specified disorders of bone density and structure, unspecified site: Secondary | ICD-10-CM

## 2011-06-08 DIAGNOSIS — M1990 Unspecified osteoarthritis, unspecified site: Secondary | ICD-10-CM

## 2011-06-08 LAB — HEPATIC FUNCTION PANEL
ALT: 19 U/L (ref 0–35)
AST: 21 U/L (ref 0–37)
Albumin: 3.8 g/dL (ref 3.5–5.2)
Alkaline Phosphatase: 82 U/L (ref 39–117)
Bilirubin, Direct: 0.1 mg/dL (ref 0.0–0.3)
Total Bilirubin: 0.2 mg/dL — ABNORMAL LOW (ref 0.3–1.2)
Total Protein: 7 g/dL (ref 6.0–8.3)

## 2011-06-08 LAB — TSH: TSH: 2.05 u[IU]/mL (ref 0.35–5.50)

## 2011-06-08 LAB — URINALYSIS
Bilirubin Urine: NEGATIVE
Hgb urine dipstick: NEGATIVE
Ketones, ur: NEGATIVE
Leukocytes, UA: NEGATIVE
Nitrite: NEGATIVE
Specific Gravity, Urine: 1.01 (ref 1.000–1.030)
Total Protein, Urine: NEGATIVE
Urine Glucose: NEGATIVE
Urobilinogen, UA: 0.2 (ref 0.0–1.0)
pH: 6.5 (ref 5.0–8.0)

## 2011-06-08 LAB — LIPID PANEL
Cholesterol: 171 mg/dL (ref 0–200)
HDL: 70.9 mg/dL (ref >39.00)
LDL Cholesterol: 94 mg/dL (ref 0–99)
Total CHOL/HDL Ratio: 2
Triglycerides: 32 mg/dL (ref 0.0–149.0)
VLDL: 6.4 mg/dL (ref 0.0–40.0)

## 2011-06-08 LAB — BASIC METABOLIC PANEL
BUN: 16 mg/dL (ref 6–23)
CO2: 30 mEq/L (ref 19–32)
Calcium: 9.2 mg/dL (ref 8.4–10.5)
Chloride: 103 mEq/L (ref 96–112)
Creatinine, Ser: 0.8 mg/dL (ref 0.4–1.2)
GFR: 77.37 mL/min (ref >60.00)
Glucose, Bld: 85 mg/dL (ref 70–99)
Potassium: 4.9 mEq/L (ref 3.5–5.1)
Sodium: 141 mEq/L (ref 135–145)

## 2011-06-08 LAB — CBC WITH DIFFERENTIAL/PLATELET
Basophils Absolute: 0.1 10*3/uL (ref 0.0–0.1)
Basophils Relative: 1.3 % (ref 0.0–3.0)
Eosinophils Absolute: 0.3 10*3/uL (ref 0.0–0.7)
Eosinophils Relative: 4.3 % (ref 0.0–5.0)
HCT: 39.9 % (ref 36.0–46.0)
Hemoglobin: 13.3 g/dL (ref 12.0–15.0)
Lymphocytes Relative: 44.1 % (ref 12.0–46.0)
Lymphs Abs: 2.9 10*3/uL (ref 0.7–4.0)
MCHC: 33.3 g/dL (ref 30.0–36.0)
MCV: 92.1 fl (ref 78.0–100.0)
Monocytes Absolute: 0.9 10*3/uL (ref 0.1–1.0)
Monocytes Relative: 13.5 % — ABNORMAL HIGH (ref 3.0–12.0)
Neutro Abs: 2.5 10*3/uL (ref 1.4–7.7)
Neutrophils Relative %: 36.8 % — ABNORMAL LOW (ref 43.0–77.0)
Platelets: 391 10*3/uL (ref 150.0–400.0)
RBC: 4.34 Mil/uL (ref 3.87–5.11)
RDW: 12.6 % (ref 11.5–14.6)
WBC: 6.7 10*3/uL (ref 4.5–10.5)

## 2011-06-08 LAB — SEDIMENTATION RATE: Sed Rate: 29 mm/hr — ABNORMAL HIGH (ref 0–22)

## 2011-06-08 LAB — VITAMIN B12: Vitamin B-12: 674 pg/mL (ref 211–911)

## 2011-06-08 MED ORDER — AMOXICILLIN 500 MG PO CAPS: 500.0000 mg | ORAL_CAPSULE | Freq: Three times a day (TID) | ORAL | Status: DC

## 2011-06-08 NOTE — Patient Instructions (Signed)

## 2011-06-08 NOTE — Telephone Encounter (Signed)
Patient called lm on triage vm c/o virus with cough x 1 wk. Patient states that cough causes her chest to hurt. Pt messg transferred to scheduler to set up. Thanks

## 2011-06-08 NOTE — Progress Notes (Signed)
Kimberly Merritt 07-Jun-1940 [id number]   History:    71 y.o.  who has not had a gynecological examination over 3 years. She stated she's had normal Pap smears all her life. She does have a history of total abdominal hysterectomy and right salpingo-oophorectomy for endometriosis in the past. Patient has been complaining for the past week of a nonproductive cough and post nasal drainage and tender frontal sinuses. She stated she had low-grade temperature at home and had some sore throat. She had a normal Pap smear in August of 2010. She's been followed by Dr. 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 Her internal medicine physician is Dr. Posey Rea who has recently done all her lab work. Patient's last colonoscopy was in January 2008 and had benign polyps removed by Dr.Maegod and has a followup with him in June. She states her last bone density study was over urine half ago and has osteopenia. I was unable to pull or report. She did stated that in the past they have tried her on Fosamax and Evista and she had adverse reactions to both medications. She's currently just taking vitamin D. 1000 units daily. Past medical history,surgical history, family history and social history were all reviewed and documented in the EPIC chart.  Gynecologic History No LMP recorded. Patient is postmenopausal. Contraception: none Last Pap: 2010. Results were: normal Last mammogram: 2013. Results were: Results pending  Obstetric History OB History    Grav Para Term Preterm Abortions TAB SAB Ect Mult Living   0                ROS:  Was performed and pertinent positives and negatives are included in the history.  Exam: chaperone present  BP 132/78  Ht 5\' 3"  (1.6 m)  Wt 129 lb (58.514 kg)  BMI 22.85 kg/m2  Body mass index is 22.85 kg/(m^2).  General appearance : Well developed well nourished female. No acute distress. Patient with tender  frontal and maxillary sinuses HEENT: Neck supple, trachea midline, no carotid bruits, no thyroidmegaly Lungs: Clear to auscultation, no rhonchi or wheezes in the upper lung fields but some slight inspiratory rhonchi on both inferior lung fields, no rib retractions  Heart: Regular rate and rhythm, no murmurs or gallops Breast:Examined in sitting and supine position were symmetrical in appearance, no palpable masses or tenderness,  no skin retraction, no nipple inversion, no nipple discharge, no skin discoloration, no axillary or supraclavicular lymphadenopathy Abdomen: no palpable masses or tenderness, no rebound or guarding Extremities: no edema or skin discoloration or tenderness  Pelvic:  Bartholin, Urethra, Skene Glands: Within normal limits             Vagina: No gross lesions or discharge, atrophic changes  Cervix: No gross lesions or discharge  Uterus  axial position, normal size, shape and consistency, non-tender and mobile  Adnexa  Without masses or tenderness  Anus and perineum  normal   Rectovaginal  normal sphincter tone without palpated masses or tenderness             Hemoccult not done as followup with GI in June     Assessment/Plan:  71 y.o. female new to the practice to be established today. Patient with prior history of osteopenia has a followup bone density study later in the year. Will last Dr. Posey Rea to send [place] a copy of the report. She was reminded to continue her vitamin  D 1000 units daily and to engage in weightbearing exercises for 30-45 minutes 3-4 times a week. Her vaginal atrophy is not causing her any problems. For her sinusitis and mild bronchitis she'll be placed on amoxicillin 500 mg twice a day for 7 days. No Pap smear done today. New Pap smear screening guidelines discussed that since she is over 71 and with no prior history of abnormal Pap smears she will not need any in the future.    Ok Edwards MD, 3:53 PM 06/08/2011

## 2011-06-09 ENCOUNTER — Telehealth: Payer: Self-pay | Admitting: *Deleted

## 2011-06-09 LAB — VITAMIN D 25 HYDROXY (VIT D DEFICIENCY, FRACTURES): Vit D, 25-Hydroxy: 38 ng/mL (ref 30–89)

## 2011-06-09 NOTE — Telephone Encounter (Signed)
PT WAS SEEN YESTERDAY AND RX GIVEN FOR AMOXICILLIN 500 MG. PT WAS TOLD RX WOULD BE FOR 7 DAYS PER OFFICE NOTE, PT WILL TAKE amoxicillin 500 mg twice a day for 7 days # 14. BUT RX E-SRIBED AMOXICILLIN 500 MG 3 TIMES DAILY? HOW SHOULD PT TAKE RX? PLEASE ADVISE

## 2011-06-09 NOTE — Telephone Encounter (Signed)
It should be 500  mg twice a day for seven days.Thank  you.

## 2011-06-09 NOTE — Telephone Encounter (Signed)
Pt informed with the below note, she will take twice daily

## 2011-06-15 ENCOUNTER — Other Ambulatory Visit: Payer: Self-pay

## 2011-06-15 DIAGNOSIS — N63 Unspecified lump in unspecified breast: Secondary | ICD-10-CM

## 2011-06-17 ENCOUNTER — Other Ambulatory Visit: Payer: Self-pay | Admitting: Dermatology

## 2011-06-22 ENCOUNTER — Ambulatory Visit
Admission: RE | Admit: 2011-06-22 | Discharge: 2011-06-22 | Disposition: A | Discharge status: Home / Self Care | Payer: Medicare Other | Source: Ambulatory Visit | Attending: Gynecology | Admitting: Gynecology

## 2011-06-22 ENCOUNTER — Other Ambulatory Visit: Payer: Self-pay | Admitting: Gynecology

## 2011-06-22 DIAGNOSIS — N63 Unspecified lump in unspecified breast: Secondary | ICD-10-CM

## 2011-06-22 DIAGNOSIS — R928 Other abnormal and inconclusive findings on diagnostic imaging of breast: Secondary | ICD-10-CM

## 2011-07-05 ENCOUNTER — Other Ambulatory Visit: Payer: Self-pay | Admitting: Gastroenterology

## 2011-08-06 ENCOUNTER — Encounter: Payer: Self-pay | Admitting: Endocrinology

## 2011-08-06 ENCOUNTER — Ambulatory Visit (INDEPENDENT_AMBULATORY_CARE_PROVIDER_SITE_OTHER): Payer: Medicare Other | Admitting: Endocrinology

## 2011-08-06 VITALS — BP 128/74 | HR 93 | Temp 98.9°F | Ht 64.0 in | Wt 130.0 lb

## 2011-08-06 DIAGNOSIS — J209 Acute bronchitis, unspecified: Secondary | ICD-10-CM

## 2011-08-06 MED ORDER — PROMETHAZINE-CODEINE 6.25-10 MG/5ML PO SYRP: 5.0000 mL | ORAL_SOLUTION | ORAL | Status: DC | PRN

## 2011-08-06 MED ORDER — CEFUROXIME AXETIL 250 MG PO TABS: 250.0000 mg | ORAL_TABLET | Freq: Two times a day (BID) | ORAL | Status: DC

## 2011-08-06 NOTE — Progress Notes (Signed)
Subjective:    Patient ID: Kimberly Merritt, female    DOB: [birth year], 71 y.o.   MRN: [id number]  HPI Pt states few days of moderate prod-quality cough in the chest, and assoc nasal congestion.   Past Medical History  Diagnosis Date  . HYPOTHYROIDISM 08/29/2006  . 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 SINUSITIS, ACUTE 01/23/2007  . ALLERGIC RHINITIS 04/25/2007  . GERD 08/29/2006  . HEPATIC CYST 08/02/2007  . OSTEOARTHRITIS 08/29/2006  . LOW BACK PAIN 08/29/2006  . Pain in Soft Tissues of Limb 08/29/2006  . OSTEOPENIA 11/19/2008  . PARESTHESIA 03/05/2009  . LIVER HEMANGIOMA 03/03/2010  . 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 MUSCLE SPASM 03/03/2010  . DEPRESSION 08/29/2006    Past Surgical History  Procedure Date  . Lumbar laminectomy     L3-4, 4-5 and L5-S1  . Tonsillectomy   . Abdominal hysterectomy   . Appendectomy   . Carpal tunnel release     both  . Bunionectomy     both  . Back surgery 2010,2006    lumb fusion  . Cataract extraction     both  . Shoulder arthroscopy 02/09/2011    Procedure: ARTHROSCOPY SHOULDER;  Surgeon: Velna Ochs, MD;  Location: [facility];  Service: Orthopedics;  Laterality: Right;  right shoulder arthroscopy subacromial decompression with rotator cuff repair    History   Social History  . Marital Status: Widowed    Spouse Name: N/A    Number of Children: N/A  . Years of Education: N/A   Occupational History  . retired    Social History Main Topics  . Smoking status: Never Smoker   . Smokeless tobacco: Never Used  . Alcohol Use: No  . Drug Use: No  . Sexually Active: Not on file   Other Topics Concern  . Not on file   Social History Narrative  . No narrative on file    Current Outpatient Prescriptions on File Prior to Visit  Medication Sig Dispense Refill  . albuterol (PROAIR HFA) 108 (90 BASE) MCG/ACT inhaler Inhale 1-2 puffs into the lungs every 4 (four) hours as needed.        . Cholecalciferol (VITAMIN D3)  1000 UNITS CAPS Take by mouth daily.        . fexofenadine (ALLEGRA) 180 MG tablet Take 180 mg by mouth daily.        . fluticasone (FLONASE) 50 MCG/ACT nasal spray 2 sprays by Nasal route daily.  16 g  6  . glucosamine-chondroitin 500-400 MG tablet Take 1 tablet by mouth daily.        . meloxicam (MOBIC) 7.5 MG tablet Take 7.5 mg by mouth 2 (two) times daily.      . methocarbamol (ROBAXIN) 500 MG tablet Take 500 mg by mouth every 6 (six) hours as needed.        . NON FORMULARY Allergy injections       . ranitidine (ZANTAC) 150 MG tablet Take 1 tablet (150 mg total) by mouth daily.  180 tablet  3  . SYNTHROID 25 MCG tablet Take 1 tablet (25 mcg total) by mouth daily.  30 tablet  11  . amoxicillin (AMOXIL) 500 MG capsule Take 1 capsule (500 mg total) by mouth 3 (three) times daily.  14 capsule  0  . HYDROcodone-acetaminophen (VICODIN) 5-500 MG per tablet Take 1 tablet by mouth every 6 (six) hours as needed.        Allergies  Allergen Reactions  .  Acetaminophen     REACTION: high dose - tremor  . Alendronate Sodium   . Contrast Media (Iodinated Diagnostic Agents)   . Oxybutynin   . Raloxifene   . Sulfonamide Derivatives   . Tramadol Hcl     Family History  Problem Relation Age of Onset  . Hypertension Other   . Stroke Mother     BP 128/74  Pulse 93  Temp 98.9 F (37.2 C) (Oral)  Ht 5\' 4"  (1.626 m)  Wt 130 lb (58.968 kg)  BMI 22.31 kg/m2  SpO2 97%   Review of Systems Fever is better, but no sob or earache.     Objective:   Physical Exam VITAL SIGNS:  See vs page GENERAL: no distress head: no deformity eyes: no periorbital swelling, no proptosis external nose and ears are normal mouth: no lesion seen. Both eac's and tm's are normal.       Assessment & Plan:  Acute bronchitis, new

## 2011-08-06 NOTE — Patient Instructions (Addendum)
allegra-d (non-prescription) will help your congestion. Here are 2 prescriptions:  Cough syrup and antibiotic.   I hope you feel better soon.  If you don't feel better by next week, please call back.  Please call sooner if you get worse.

## 2011-08-09 ENCOUNTER — Telehealth: Payer: Self-pay | Admitting: Internal Medicine

## 2011-08-09 NOTE — Telephone Encounter (Signed)
Ok - w/in this pm Thx

## 2011-08-09 NOTE — Telephone Encounter (Signed)
Caller: Joanell/Patient; PCP: Sonda Primes; CB#: [phone];  Call regarding Seen 7/5; Bronchial Congestion, Sinus Pain; Possible low grade fever- subjective.  Emergent sx ruled out. Still has sinus pain, yellow sputum; does not feel improvement.   Home care for the interim and parameters for callback given.  Advised see provider ASAP per URI protocol.  No appointments available with Dr. Posey Rea and patient states she only wants to see him.  Note to office for response to caller regarding appointment with PCP.

## 2011-08-09 NOTE — Telephone Encounter (Signed)
Pt scheduled for tom AM.

## 2011-08-10 ENCOUNTER — Telehealth: Payer: Self-pay | Admitting: Internal Medicine

## 2011-08-10 ENCOUNTER — Ambulatory Visit (INDEPENDENT_AMBULATORY_CARE_PROVIDER_SITE_OTHER)
Admission: RE | Admit: 2011-08-10 | Discharge: 2011-08-10 | Disposition: A | Discharge status: Home / Self Care | Payer: Medicare Other | Source: Ambulatory Visit | Attending: Internal Medicine | Admitting: Internal Medicine

## 2011-08-10 ENCOUNTER — Ambulatory Visit (INDEPENDENT_AMBULATORY_CARE_PROVIDER_SITE_OTHER): Payer: Medicare Other | Admitting: Internal Medicine

## 2011-08-10 ENCOUNTER — Encounter: Payer: Self-pay | Admitting: Internal Medicine

## 2011-08-10 VITALS — BP 128/80 | HR 80 | Temp 98.1°F | Resp 16 | Wt 127.0 lb

## 2011-08-10 DIAGNOSIS — M1990 Unspecified osteoarthritis, unspecified site: Secondary | ICD-10-CM

## 2011-08-10 DIAGNOSIS — R609 Edema, unspecified: Secondary | ICD-10-CM

## 2011-08-10 DIAGNOSIS — R05 Cough: Secondary | ICD-10-CM

## 2011-08-10 DIAGNOSIS — R059 Cough, unspecified: Secondary | ICD-10-CM | POA: Insufficient documentation

## 2011-08-10 DIAGNOSIS — R0609 Other forms of dyspnea: Secondary | ICD-10-CM

## 2011-08-10 DIAGNOSIS — R0989 Other specified symptoms and signs involving the circulatory and respiratory systems: Secondary | ICD-10-CM

## 2011-08-10 DIAGNOSIS — J019 Acute sinusitis, unspecified: Secondary | ICD-10-CM

## 2011-08-10 MED ORDER — DOXYCYCLINE HYCLATE 100 MG PO TABS: 100.0000 mg | ORAL_TABLET | Freq: Two times a day (BID) | ORAL | Status: DC

## 2011-08-10 MED ORDER — BUDESONIDE-FORMOTEROL FUMARATE 160-4.5 MCG/ACT IN AERO: 2.0000 | INHALATION_SPRAY | Freq: Two times a day (BID) | RESPIRATORY_TRACT | Status: DC

## 2011-08-10 MED ORDER — HYDROCODONE-ACETAMINOPHEN 5-500 MG PO TABS: 1.0000 | ORAL_TABLET | Freq: Four times a day (QID) | ORAL | Status: DC | PRN

## 2011-08-10 MED ORDER — METHYLPREDNISOLONE ACETATE 80 MG/ML IJ SUSP
120.0000 mg | Freq: Once | INTRAMUSCULAR | Status: AC
  Administered 2011-08-10: 120 mg via INTRAMUSCULAR

## 2011-08-10 NOTE — Assessment & Plan Note (Signed)
Doxy

## 2011-08-10 NOTE — Assessment & Plan Note (Signed)
Continue with current prescription therapy as reflected on the Med list.  

## 2011-08-10 NOTE — Telephone Encounter (Signed)
Pt informed

## 2011-08-10 NOTE — Assessment & Plan Note (Signed)
CXR Depo Symbicort Doxy

## 2011-08-10 NOTE — Telephone Encounter (Signed)
Kimberly Merritt, please, inform patient that there is no pneumonia on cxr Thx

## 2011-08-10 NOTE — Progress Notes (Signed)
Subjective:    Patient ID: Kimberly Merritt, female    DOB: 03-Dec-1940, 71 y.o.   MRN: [id number]  Sinusitis The current episode started 1 to 4 weeks ago. There has been no fever. Associated symptoms include coughing. Pertinent negatives include no chills, congestion, headaches or sinus pressure.  Cough This is a new problem. The current episode started 1 to 4 weeks ago. The problem has been gradually worsening. The cough is non-productive. Associated symptoms include myalgias. Pertinent negatives include no chills, headaches or rash. The symptoms are aggravated by cold air. The treatment provided mild relief. Her past medical history is significant for asthma.     The patient is here to follow up on chronic allergies depression, anxiety, headaches and chronic moderate fibromyalgia and OA symptoms controlled partially with medicines, diet and exercise.  BP Readings from Last 3 Encounters:  08/10/11 128/80  08/06/11 128/74  06/08/11 132/78   Wt Readings from Last 3 Encounters:  08/10/11 127 lb (57.607 kg)  08/06/11 130 lb (58.968 kg)  06/08/11 129 lb (58.514 kg)       Review of Systems  Constitutional: Positive for fatigue. Negative for chills, activity change, appetite change and unexpected weight change.  HENT: Negative for congestion, mouth sores and sinus pressure.   Eyes: Negative for visual disturbance.  Respiratory: Positive for cough. Negative for chest tightness.   Gastrointestinal: Negative for nausea and abdominal pain.  Genitourinary: Negative for frequency, difficulty urinating and vaginal pain.  Musculoskeletal: Positive for myalgias and arthralgias. Negative for back pain and gait problem.  Skin: Negative for pallor and rash.  Neurological: Negative for dizziness, tremors, weakness, numbness and headaches.  Psychiatric/Behavioral: Negative for confusion and disturbed wake/sleep cycle. The patient is nervous/anxious.        Objective:   Physical Exam    Constitutional: She appears well-developed and well-nourished. No distress.  HENT:  Head: Normocephalic.  Right Ear: External ear normal.  Left Ear: External ear normal.  Nose: Nose normal.  Mouth/Throat: Oropharynx is clear and moist.  Eyes: Conjunctivae are normal. Pupils are equal, round, and reactive to light. Right eye exhibits no discharge. Left eye exhibits no discharge.  Neck: Normal range of motion. Neck supple. No JVD present. No tracheal deviation present. No thyromegaly present.  Cardiovascular: Normal rate, regular rhythm and normal heart sounds.   Pulmonary/Chest: No stridor. No respiratory distress. She has no wheezes.  Abdominal: Soft. Bowel sounds are normal. She exhibits no distension and no mass. There is no tenderness. There is no rebound and no guarding.  Musculoskeletal: She exhibits edema (trace) and tenderness.  Lymphadenopathy:    She has no cervical adenopathy.  Neurological: She displays normal reflexes. No cranial nerve deficit. She exhibits normal muscle tone. Coordination normal.  Skin: No rash noted. No erythema.       Spider veins on B LEs  Psychiatric: She has a normal mood and affect. Her behavior is normal. Judgment and thought content normal.   OA changes in hands  Lab Results  Component Value Date   WBC 6.7 06/08/2011   HGB 13.3 06/08/2011   HCT 39.9 06/08/2011   PLT 391.0 06/08/2011   GLUCOSE 85 06/08/2011   CHOL 171 06/08/2011   TRIG 32.0 06/08/2011   HDL 70.90 06/08/2011   LDLDIRECT 131.0 02/05/2008   LDLCALC 94 06/08/2011   ALT 19 06/08/2011   AST 21 06/08/2011   NA 141 06/08/2011   K 4.9 06/08/2011   CL 103 06/08/2011   CREATININE 0.8 06/08/2011  BUN 16 06/08/2011   CO2 30 06/08/2011   TSH 2.05 06/08/2011   INR 1.0 04/19/2008    I personally provided the inhaler use teaching. After the teaching patient was able to demonstrate it's use effectively. All questions were answered      Assessment & Plan:

## 2011-08-13 ENCOUNTER — Telehealth: Payer: Self-pay | Admitting: Internal Medicine

## 2011-08-13 MED ORDER — MOXIFLOXACIN HCL 400 MG PO TABS: 400.0000 mg | ORAL_TABLET | Freq: Every day | ORAL | Status: AC

## 2011-08-13 NOTE — Telephone Encounter (Signed)
See HX from call at 0938 this AM.  Caller: Kimberly Merritt/Patient; PCP: Plotnikov, Alex; CB#: [phone];  Call regarding N&V On Anbx.  Pt has not received an answer yet, so this is her second call.  EPIC chart shows that Avelox   400 mg 1 PO QD x 10 days was called in.  Called [facility] [address] at [phone] and RX is ready and pt inst.

## 2011-08-13 NOTE — Telephone Encounter (Signed)
Caller: Kimberly Merritt/Patient; PCP: Sonda Primes; CB#: [phone]; ; ; Call regarding Vomiting On Antibiotic;  Onset- 08/10/11 Pt c/o of vomiting antibiotic she was given. She is taking Vibra tabs (Doxycycline). She has tried taking it both ways suggested on instructions. One suggestion said empty stomach and the other said 2-3 hrs after a meal. If she does not vomit the medicine it still hurts her stomach and feels like it is going to come back up. Emergent s/s of Nausea and  Vomiting protocol r/o. Call provider immediately, message sent. Pt wants to know if she can use Avelox or Amoxicillin because she has taken them fine in the past.

## 2011-08-13 NOTE — Telephone Encounter (Signed)
Ok Avelox D/c doxy Thx

## 2011-08-13 NOTE — Telephone Encounter (Signed)
Pt informed

## 2011-08-16 ENCOUNTER — Encounter: Payer: Self-pay | Admitting: Internal Medicine

## 2011-08-31 ENCOUNTER — Encounter: Payer: Self-pay | Admitting: Internal Medicine

## 2011-08-31 ENCOUNTER — Ambulatory Visit (INDEPENDENT_AMBULATORY_CARE_PROVIDER_SITE_OTHER): Payer: Medicare Other | Admitting: Internal Medicine

## 2011-08-31 VITALS — BP 130/74 | HR 80 | Temp 97.0°F | Resp 16 | Ht 64.0 in | Wt 127.0 lb

## 2011-08-31 DIAGNOSIS — M545 Low back pain, unspecified: Secondary | ICD-10-CM

## 2011-08-31 DIAGNOSIS — K219 Gastro-esophageal reflux disease without esophagitis: Secondary | ICD-10-CM

## 2011-08-31 DIAGNOSIS — F329 Major depressive disorder, single episode, unspecified: Secondary | ICD-10-CM

## 2011-08-31 DIAGNOSIS — Z0000 Encounter for general adult medical examination without abnormal findings: Secondary | ICD-10-CM | POA: Insufficient documentation

## 2011-08-31 DIAGNOSIS — Z136 Encounter for screening for cardiovascular disorders: Secondary | ICD-10-CM

## 2011-08-31 DIAGNOSIS — D472 Monoclonal gammopathy: Secondary | ICD-10-CM

## 2011-08-31 DIAGNOSIS — E039 Hypothyroidism, unspecified: Secondary | ICD-10-CM

## 2011-08-31 MED ORDER — FLUCONAZOLE 150 MG PO TABS: 150.0000 mg | ORAL_TABLET | Freq: Once | ORAL | Status: AC

## 2011-08-31 NOTE — Assessment & Plan Note (Signed)
Continue with current prescription therapy as reflected on the Med list.  

## 2011-08-31 NOTE — Progress Notes (Signed)
   Subjective:    Patient ID: Kimberly Merritt, female    DOB: 1940-02-22, 71 y.o.   MRN: [id number]  HPI  The patient is here for a wellness exam. The patient has been doing well overall without major physical or psychological issues going on lately.  The patient is here to follow up on chronic allergies depression, anxiety, headaches and chronic moderate fibromyalgia and OA symptoms controlled partially with medicines, diet and exercise.  BP Readings from Last 3 Encounters:  08/31/11 130/74  08/10/11 128/80  08/06/11 128/74   Wt Readings from Last 3 Encounters:  08/31/11 127 lb (57.607 kg)  08/10/11 127 lb (57.607 kg)  08/06/11 130 lb (58.968 kg)       Review of Systems  Constitutional: Positive for fatigue. Negative for chills, activity change, appetite change and unexpected weight change.  HENT: Negative for congestion, mouth sores and sinus pressure.   Eyes: Negative for visual disturbance.  Respiratory: Negative for cough and chest tightness.   Gastrointestinal: Negative for nausea and abdominal pain.  Genitourinary: Negative for frequency, difficulty urinating and vaginal pain.  Musculoskeletal: Positive for myalgias and arthralgias. Negative for back pain and gait problem.  Skin: Negative for pallor and rash.  Neurological: Negative for dizziness, tremors, weakness, numbness and headaches.  Psychiatric/Behavioral: Negative for confusion and disturbed wake/sleep cycle. The patient is nervous/anxious.        Objective:   Physical Exam  Constitutional: She appears well-developed and well-nourished. No distress.  HENT:  Head: Normocephalic.  Right Ear: External ear normal.  Left Ear: External ear normal.  Nose: Nose normal.  Mouth/Throat: Oropharynx is clear and moist.  Eyes: Conjunctivae are normal. Pupils are equal, round, and reactive to light. Right eye exhibits no discharge. Left eye exhibits no discharge.  Neck: Normal range of motion. Neck supple. No JVD present.  No tracheal deviation present. No thyromegaly present.  Cardiovascular: Normal rate, regular rhythm and normal heart sounds.   Pulmonary/Chest: No stridor. No respiratory distress. She has no wheezes.  Abdominal: Soft. Bowel sounds are normal. She exhibits no distension and no mass. There is no tenderness. There is no rebound and no guarding.  Musculoskeletal: She exhibits edema (trace) and tenderness.  Lymphadenopathy:    She has no cervical adenopathy.  Neurological: She displays normal reflexes. No cranial nerve deficit. She exhibits normal muscle tone. Coordination normal.  Skin: No rash noted. No erythema.       Spider veins on B LEs  Psychiatric: She has a normal mood and affect. Her behavior is normal. Judgment and thought content normal.   OA changes in hands  Lab Results  Component Value Date   WBC 6.7 06/08/2011   HGB 13.3 06/08/2011   HCT 39.9 06/08/2011   PLT 391.0 06/08/2011   GLUCOSE 85 06/08/2011   CHOL 171 06/08/2011   TRIG 32.0 06/08/2011   HDL 70.90 06/08/2011   LDLDIRECT 131.0 02/05/2008   LDLCALC 94 06/08/2011   ALT 19 06/08/2011   AST 21 06/08/2011   NA 141 06/08/2011   K 4.9 06/08/2011   CL 103 06/08/2011   CREATININE 0.8 06/08/2011   BUN 16 06/08/2011   CO2 30 06/08/2011   TSH 2.05 06/08/2011   INR 1.0 04/19/2008   EKG is ok      Assessment & Plan:

## 2011-08-31 NOTE — Assessment & Plan Note (Signed)
Monitoring

## 2011-08-31 NOTE — Assessment & Plan Note (Addendum)
The patient is here for annual Medicare wellness examination and management of other chronic and acute problems.   The risk factors are reflected in the social history.  The roster of all physicians providing medical care to patient - is listed in the Snapshot section of the chart.  Activities of daily living:  The patient is 100% inedpendent in all ADLs: dressing, toileting, feeding as well as independent mobility  Home safety : The patient has smoke detectors in the home. They wear seatbelts. There is no violence in the home.   There is no risks for hepatitis, STDs or HIV. There is no   history of blood transfusion. They have no travel history to infectious disease endemic areas of the world.  The patient has  seen their dentist in the last 12 month. They have  seen their eye doctor in the last year. They deny  Any major hearing difficulty and have not had audiologic testing in the last year.  They do not  have excessive sun exposure. Discussed the need for sun protection: hats, long sleeves and use of sunscreen if there is significant sun exposure.   Diet: the importance of a healthy diet is discussed. They do have a reasonably healthy  diet.  The patient has a fairly regular exercise program of a mixed nature: walking, yard work, etc.The benefits of regular aerobic exercise were discussed.  Depression screen: there are no signs or vegative symptoms of depression- irritability, change in appetite, anhedonia, sadness/tearfullness.  Cognitive assessment: the patient manages all their financial and personal affairs and is actively engaged. They could relate day,date,year and events; recalled 3/3 objects at 3 minutes  The following portions of the patient's history were reviewed and updated as appropriate: allergies, current medications, past family history, past medical history,  past surgical history, past social history  and problem list.  Vision, hearing, body mass index were assessed and  reviewed.   During the course of the visit the patient was educated and counseled about appropriate screening and preventive services including : fall prevention , diabetes screening, nutrition counseling, colorectal cancer screening, and recommended immunizations.  DT in 2006 per pt

## 2011-11-23 ENCOUNTER — Ambulatory Visit: Payer: Medicare Other | Admitting: Internal Medicine

## 2011-11-23 ENCOUNTER — Ambulatory Visit (INDEPENDENT_AMBULATORY_CARE_PROVIDER_SITE_OTHER): Payer: Medicare Other

## 2011-11-23 DIAGNOSIS — Z23 Encounter for immunization: Secondary | ICD-10-CM

## 2011-11-24 ENCOUNTER — Encounter: Payer: Self-pay | Admitting: Internal Medicine

## 2011-11-24 ENCOUNTER — Ambulatory Visit (INDEPENDENT_AMBULATORY_CARE_PROVIDER_SITE_OTHER): Payer: Medicare Other | Admitting: Internal Medicine

## 2011-11-24 VITALS — BP 148/76 | HR 80 | Temp 98.2°F | Resp 16 | Wt 130.0 lb

## 2011-11-24 DIAGNOSIS — M545 Low back pain, unspecified: Secondary | ICD-10-CM

## 2011-11-24 DIAGNOSIS — J309 Allergic rhinitis, unspecified: Secondary | ICD-10-CM

## 2011-11-24 DIAGNOSIS — F3289 Other specified depressive episodes: Secondary | ICD-10-CM

## 2011-11-24 DIAGNOSIS — E039 Hypothyroidism, unspecified: Secondary | ICD-10-CM

## 2011-11-24 DIAGNOSIS — M899 Disorder of bone, unspecified: Secondary | ICD-10-CM

## 2011-11-24 DIAGNOSIS — K219 Gastro-esophageal reflux disease without esophagitis: Secondary | ICD-10-CM

## 2011-11-24 DIAGNOSIS — M1990 Unspecified osteoarthritis, unspecified site: Secondary | ICD-10-CM

## 2011-11-24 DIAGNOSIS — F329 Major depressive disorder, single episode, unspecified: Secondary | ICD-10-CM

## 2011-11-24 DIAGNOSIS — M949 Disorder of cartilage, unspecified: Secondary | ICD-10-CM

## 2011-11-24 DIAGNOSIS — D472 Monoclonal gammopathy: Secondary | ICD-10-CM

## 2011-11-24 DIAGNOSIS — E559 Vitamin D deficiency, unspecified: Secondary | ICD-10-CM

## 2011-11-24 MED ORDER — NABUMETONE 500 MG PO TABS: 500.0000 mg | ORAL_TABLET | Freq: Two times a day (BID) | ORAL | Status: DC | PRN

## 2011-11-24 NOTE — Assessment & Plan Note (Signed)
Continue with current prescription therapy as reflected on the Med list.  

## 2011-11-24 NOTE — Assessment & Plan Note (Signed)
Chronic  PO meds intolerant BDS is due Prolia info given

## 2011-11-24 NOTE — Progress Notes (Signed)
Patient ID: Kimberly Merritt, female   DOB: Jul 12, 1940, 71 y.o.   MRN: [id number]   Subjective:    Patient ID: Kimberly Merritt, female    DOB: Jul 31, 1940, 71 y.o.   MRN: [id number]  HPI  The patient is here to follow up on chronic allergies depression, anxiety, headaches, osteopenia and chronic moderate fibromyalgia and OA symptoms controlled partially with medicines, diet and exercise.  BP Readings from Last 3 Encounters:  11/24/11 148/76  08/31/11 130/74  08/10/11 128/80   Wt Readings from Last 3 Encounters:  11/24/11 130 lb (58.968 kg)  08/31/11 127 lb (57.607 kg)  08/10/11 127 lb (57.607 kg)       Review of Systems  Constitutional: Positive for fatigue. Negative for chills, activity change, appetite change and unexpected weight change.  HENT: Negative for congestion, mouth sores and sinus pressure.   Eyes: Negative for visual disturbance.  Respiratory: Negative for cough and chest tightness.   Gastrointestinal: Negative for nausea and abdominal pain.  Genitourinary: Negative for frequency, difficulty urinating and vaginal pain.  Musculoskeletal: Positive for myalgias and arthralgias. Negative for back pain and gait problem.  Skin: Negative for pallor and rash.  Neurological: Negative for dizziness, tremors, weakness, numbness and headaches.  Psychiatric/Behavioral: Negative for confusion and disturbed wake/sleep cycle. The patient is nervous/anxious.        Objective:   Physical Exam  Constitutional: She appears well-developed and well-nourished. No distress.  HENT:  Head: Normocephalic.  Right Ear: External ear normal.  Left Ear: External ear normal.  Nose: Nose normal.  Mouth/Throat: Oropharynx is clear and moist.  Eyes: Conjunctivae normal are normal. Pupils are equal, round, and reactive to light. Right eye exhibits no discharge. Left eye exhibits no discharge.  Neck: Normal range of motion. Neck supple. No JVD present. No tracheal deviation present. No thyromegaly  present.  Cardiovascular: Normal rate, regular rhythm and normal heart sounds.   Pulmonary/Chest: No stridor. No respiratory distress. She has no wheezes.  Abdominal: Soft. Bowel sounds are normal. She exhibits no distension and no mass. There is no tenderness. There is no rebound and no guarding.  Musculoskeletal: She exhibits edema (trace) and tenderness.  Lymphadenopathy:    She has no cervical adenopathy.  Neurological: She displays normal reflexes. No cranial nerve deficit. She exhibits normal muscle tone. Coordination normal.  Skin: No rash noted. No erythema.       Spider veins on B LEs  Psychiatric: She has a normal mood and affect. Her behavior is normal. Judgment and thought content normal.   OA changes in hands  Lab Results  Component Value Date   WBC 6.7 06/08/2011   HGB 13.3 06/08/2011   HCT 39.9 06/08/2011   PLT 391.0 06/08/2011   GLUCOSE 85 06/08/2011   CHOL 171 06/08/2011   TRIG 32.0 06/08/2011   HDL 70.90 06/08/2011   LDLDIRECT 131.0 02/05/2008   LDLCALC 94 06/08/2011   ALT 19 06/08/2011   AST 21 06/08/2011   NA 141 06/08/2011   K 4.9 06/08/2011   CL 103 06/08/2011   CREATININE 0.8 06/08/2011   BUN 16 06/08/2011   CO2 30 06/08/2011   TSH 2.05 06/08/2011   INR 1.0 04/19/2008   EKG is ok      Assessment & Plan:

## 2011-11-29 ENCOUNTER — Ambulatory Visit: Payer: Medicare Other | Admitting: Internal Medicine

## 2011-12-02 ENCOUNTER — Ambulatory Visit: Payer: Medicare Other

## 2011-12-06 ENCOUNTER — Ambulatory Visit (INDEPENDENT_AMBULATORY_CARE_PROVIDER_SITE_OTHER)
Admission: RE | Admit: 2011-12-06 | Discharge: 2011-12-06 | Disposition: A | Discharge status: Home / Self Care | Payer: Medicare Other | Source: Ambulatory Visit | Attending: Internal Medicine | Admitting: Internal Medicine

## 2011-12-06 DIAGNOSIS — M899 Disorder of bone, unspecified: Secondary | ICD-10-CM

## 2011-12-23 ENCOUNTER — Other Ambulatory Visit: Payer: Self-pay

## 2011-12-23 NOTE — Telephone Encounter (Signed)
OK. Is it black? If the stool is black she should not be taking meloxicam either. Does she need to be seen? Thx

## 2011-12-23 NOTE — Telephone Encounter (Signed)
Pt called c/o of abdominal pain and dark "sticky" stool since starting Relafen. Pt is requesting to D/C this medication and restart Meloxicam, please advise.

## 2011-12-24 NOTE — Telephone Encounter (Signed)
I spoke to pt- she states stool is not black but is darker and sticky. She declines OV.

## 2011-12-24 NOTE — Telephone Encounter (Signed)
Noted. Thx.

## 2012-01-06 ENCOUNTER — Encounter: Payer: Self-pay | Admitting: Internal Medicine

## 2012-01-06 ENCOUNTER — Ambulatory Visit (INDEPENDENT_AMBULATORY_CARE_PROVIDER_SITE_OTHER): Payer: Medicare Other | Admitting: Internal Medicine

## 2012-01-06 VITALS — BP 132/76 | HR 76 | Temp 98.3°F | Ht 64.0 in | Wt 131.8 lb

## 2012-01-06 DIAGNOSIS — K219 Gastro-esophageal reflux disease without esophagitis: Secondary | ICD-10-CM

## 2012-01-06 DIAGNOSIS — J329 Chronic sinusitis, unspecified: Secondary | ICD-10-CM

## 2012-01-06 MED ORDER — PANTOPRAZOLE SODIUM 40 MG PO TBEC: 40.0000 mg | DELAYED_RELEASE_TABLET | Freq: Every day | ORAL | Status: DC

## 2012-01-06 MED ORDER — AMOXICILLIN 500 MG PO CAPS: 500.0000 mg | ORAL_CAPSULE | Freq: Three times a day (TID) | ORAL | Status: DC

## 2012-01-06 MED ORDER — FLUTICASONE PROPIONATE 50 MCG/ACT NA SUSP: 2.0000 | Freq: Every day | NASAL | Status: DC

## 2012-01-06 MED ORDER — ALBUTEROL SULFATE HFA 108 (90 BASE) MCG/ACT IN AERS: 1.0000 | INHALATION_SPRAY | RESPIRATORY_TRACT | Status: DC | PRN

## 2012-01-06 NOTE — Patient Instructions (Addendum)

## 2012-01-06 NOTE — Progress Notes (Signed)
HPI  Pt presents to the clinic today with c/o sinus pain, pressure, headache and nasal congestion. She has been taking flonase without much relief. This has been going on now for a week. She is blowing thick yellow sputum out of her nose intermittently throughout the day. She is having headaches intermittintly throughout the day which are relieved by tylenol. She has a history of allergies and recurrent sinus infections. Additionally, she c/o of reflux problems not relieved with zantac. This is an ongoing problem which is getting worse over the past week.  Review of Systems    Past Medical History  Diagnosis Date  . HYPOTHYROIDISM 08/29/2006  . 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 SINUSITIS, ACUTE 01/23/2007  . ALLERGIC RHINITIS 04/25/2007  . GERD 08/29/2006  . HEPATIC CYST 08/02/2007  . OSTEOARTHRITIS 08/29/2006  . LOW BACK PAIN 08/29/2006  . Pain in Soft Tissues of Limb 08/29/2006  . OSTEOPENIA 11/19/2008  . PARESTHESIA 03/05/2009  . LIVER HEMANGIOMA 03/03/2010  . 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 MUSCLE SPASM 03/03/2010  . DEPRESSION 08/29/2006    Family History  Problem Relation Age of Onset  . Hypertension Other   . Stroke Mother     History   Social History  . Marital Status: Widowed    Spouse Name: N/A    Number of Children: N/A  . Years of Education: N/A   Occupational History  . retired    Social History Main Topics  . Smoking status: Never Smoker   . Smokeless tobacco: Never Used  . Alcohol Use: No  . Drug Use: No  . Sexually Active: Not on file   Other Topics Concern  . Not on file   Social History Narrative  . No narrative on file    Allergies  Allergen Reactions  . Acetaminophen     REACTION: high dose - tremor  . Alendronate Sodium   . Contrast Media (Iodinated Diagnostic Agents)   . Doxycycline     n/v  . Oxybutynin   . Raloxifene   . Sulfonamide Derivatives   . Tramadol Hcl      Constitutional: Positive headache, fatigue and fever.  Denies abrupt weight changes.  HEENT:  Positive eye pain, pressure behind the eyes, facial pain, nasal congestion and sore throat. Denies eye redness, ear pain, ringing in the ears, wax buildup, runny nose or bloody nose. Respiratory: Positive cough and thick green sputum production. Denies difficulty breathing or shortness of breath.  Cardiovascular: Denies chest pain, chest tightness, palpitations or swelling in the hands or feet.   No other specific complaints in a complete review of systems (except as listed in HPI above).  Objective:    General: Appears her stated age, well developed, well nourished in NAD. HEENT: Head: normal shape and size; Eyes: sclera white, no icterus, conjunctiva pink, PERRLA and EOMs intact; Ears: Tm's gray and intact, normal light reflex; Nose: mucosa pink and moist, septum midline; Throat/Mouth: + PND. Teeth present, mucosa pink and moist, no exudate noted, no lesions or ulcerations noted.  Neck: Mild cervical lymphadenopathy. Neck supple, trachea midline. No massses, lumps or thyromegaly present.  Cardiovascular: Normal rate and rhythm. S1,S2 noted.  No murmur, rubs or gallops noted. No JVD or BLE edema. No carotid bruits noted. Pulmonary/Chest: Normal effort and positive vesicular breath sounds. No respiratory distress. No wheezes, rales or ronchi noted.      Assessment & Plan:   Acute bacterial sinusitis  Can use a Neti Pot which can be purchased from  your local drug store. Flonase 2 sprays each nostril for 3 days and then as needed. Amoxil TID for 10 days  GERD:  RX for prontonix 40 mg daily Can also take Zantac in addition to protonix  RTC as needed or if symptoms persist.

## 2012-02-17 ENCOUNTER — Other Ambulatory Visit (INDEPENDENT_AMBULATORY_CARE_PROVIDER_SITE_OTHER): Payer: Medicare Other

## 2012-02-17 DIAGNOSIS — M545 Low back pain, unspecified: Secondary | ICD-10-CM

## 2012-02-17 DIAGNOSIS — M1990 Unspecified osteoarthritis, unspecified site: Secondary | ICD-10-CM

## 2012-02-17 DIAGNOSIS — F329 Major depressive disorder, single episode, unspecified: Secondary | ICD-10-CM

## 2012-02-17 DIAGNOSIS — K219 Gastro-esophageal reflux disease without esophagitis: Secondary | ICD-10-CM

## 2012-02-17 DIAGNOSIS — M949 Disorder of cartilage, unspecified: Secondary | ICD-10-CM

## 2012-02-17 DIAGNOSIS — M899 Disorder of bone, unspecified: Secondary | ICD-10-CM

## 2012-02-17 DIAGNOSIS — E559 Vitamin D deficiency, unspecified: Secondary | ICD-10-CM

## 2012-02-17 DIAGNOSIS — J309 Allergic rhinitis, unspecified: Secondary | ICD-10-CM

## 2012-02-17 DIAGNOSIS — E039 Hypothyroidism, unspecified: Secondary | ICD-10-CM

## 2012-02-17 DIAGNOSIS — D472 Monoclonal gammopathy: Secondary | ICD-10-CM

## 2012-02-17 LAB — BASIC METABOLIC PANEL
BUN: 16 mg/dL (ref 6–23)
CO2: 29 mEq/L (ref 19–32)
Calcium: 8.7 mg/dL (ref 8.4–10.5)
Chloride: 101 mEq/L (ref 96–112)
Creatinine, Ser: 0.9 mg/dL (ref 0.4–1.2)
GFR: 64.63 mL/min (ref >60.00)
Glucose, Bld: 116 mg/dL — ABNORMAL HIGH (ref 70–99)
Potassium: 4.3 mEq/L (ref 3.5–5.1)
Sodium: 137 mEq/L (ref 135–145)

## 2012-02-17 LAB — TSH: TSH: 0.97 u[IU]/mL (ref 0.35–5.50)

## 2012-02-18 LAB — VITAMIN D 25 HYDROXY (VIT D DEFICIENCY, FRACTURES): Vit D, 25-Hydroxy: 36 ng/mL (ref 30–89)

## 2012-02-21 ENCOUNTER — Ambulatory Visit (INDEPENDENT_AMBULATORY_CARE_PROVIDER_SITE_OTHER): Payer: Medicare Other | Admitting: Internal Medicine

## 2012-02-21 ENCOUNTER — Encounter: Payer: Self-pay | Admitting: Internal Medicine

## 2012-02-21 VITALS — BP 130/70 | HR 80 | Temp 96.9°F | Resp 16 | Wt 130.0 lb

## 2012-02-21 DIAGNOSIS — E039 Hypothyroidism, unspecified: Secondary | ICD-10-CM

## 2012-02-21 DIAGNOSIS — F329 Major depressive disorder, single episode, unspecified: Secondary | ICD-10-CM

## 2012-02-21 DIAGNOSIS — K219 Gastro-esophageal reflux disease without esophagitis: Secondary | ICD-10-CM

## 2012-02-21 DIAGNOSIS — M545 Low back pain, unspecified: Secondary | ICD-10-CM

## 2012-02-21 MED ORDER — HYDROCODONE-ACETAMINOPHEN 5-500 MG PO TABS: 1.0000 | ORAL_TABLET | Freq: Four times a day (QID) | ORAL | Status: DC | PRN

## 2012-02-21 MED ORDER — SYNTHROID 25 MCG PO TABS: 25.0000 ug | ORAL_TABLET | Freq: Every day | ORAL | Status: DC

## 2012-02-21 NOTE — Assessment & Plan Note (Signed)
Continue with current prescription therapy as reflected on the Med list.  

## 2012-02-21 NOTE — Assessment & Plan Note (Signed)
She had to stop relafen, meloxicam

## 2012-02-21 NOTE — Progress Notes (Signed)
   Subjective:    HPI  The patient is here to follow up on chronic allergies depression, anxiety, headaches, osteopenia and chronic moderate fibromyalgia and OA symptoms controlled partially with medicines, diet and exercise.  BP Readings from Last 3 Encounters:  02/21/12 130/70  01/06/12 132/76  11/24/11 148/76   Wt Readings from Last 3 Encounters:  02/21/12 130 lb (58.968 kg)  01/06/12 131 lb 12.8 oz (59.784 kg)  11/24/11 130 lb (58.968 kg)       Review of Systems  Constitutional: Positive for fatigue. Negative for chills, activity change, appetite change and unexpected weight change.  HENT: Negative for congestion, mouth sores and sinus pressure.   Eyes: Negative for visual disturbance.  Respiratory: Negative for cough and chest tightness.   Gastrointestinal: Negative for nausea and abdominal pain.  Genitourinary: Negative for frequency, difficulty urinating and vaginal pain.  Musculoskeletal: Positive for myalgias and arthralgias. Negative for back pain and gait problem.  Skin: Negative for pallor and rash.  Neurological: Negative for dizziness, tremors, weakness, numbness and headaches.  Psychiatric/Behavioral: Negative for confusion and sleep disturbance. The patient is nervous/anxious.        Objective:   Physical Exam  Constitutional: She appears well-developed and well-nourished. No distress.  HENT:  Head: Normocephalic.  Right Ear: External ear normal.  Left Ear: External ear normal.  Nose: Nose normal.  Mouth/Throat: Oropharynx is clear and moist.  Eyes: Conjunctivae normal are normal. Pupils are equal, round, and reactive to light. Right eye exhibits no discharge. Left eye exhibits no discharge.  Neck: Normal range of motion. Neck supple. No JVD present. No tracheal deviation present. No thyromegaly present.  Cardiovascular: Normal rate, regular rhythm and normal heart sounds.   Pulmonary/Chest: No stridor. No respiratory distress. She has no wheezes.    Abdominal: Soft. Bowel sounds are normal. She exhibits no distension and no mass. There is no tenderness. There is no rebound and no guarding.  Musculoskeletal: She exhibits edema (trace) and tenderness.  Lymphadenopathy:    She has no cervical adenopathy.  Neurological: She displays normal reflexes. No cranial nerve deficit. She exhibits normal muscle tone. Coordination normal.  Skin: No rash noted. No erythema.       Spider veins on B LEs  Psychiatric: She has a normal mood and affect. Her behavior is normal. Judgment and thought content normal.   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

## 2012-02-21 NOTE — Assessment & Plan Note (Addendum)
Off Rx - resolved

## 2012-05-08 ENCOUNTER — Other Ambulatory Visit: Payer: Self-pay

## 2012-05-08 DIAGNOSIS — Z1231 Encounter for screening mammogram for malignant neoplasm of breast: Secondary | ICD-10-CM

## 2012-06-06 ENCOUNTER — Ambulatory Visit
Admission: RE | Admit: 2012-06-06 | Discharge: 2012-06-06 | Disposition: A | Discharge status: Home / Self Care | Payer: Medicare Other | Source: Ambulatory Visit

## 2012-06-06 DIAGNOSIS — Z1231 Encounter for screening mammogram for malignant neoplasm of breast: Secondary | ICD-10-CM

## 2012-06-12 ENCOUNTER — Encounter: Payer: Self-pay | Admitting: Gynecology

## 2012-06-12 ENCOUNTER — Ambulatory Visit (INDEPENDENT_AMBULATORY_CARE_PROVIDER_SITE_OTHER): Payer: Medicare Other | Admitting: Gynecology

## 2012-06-12 VITALS — BP 138/76 | Ht 63.25 in | Wt 129.0 lb

## 2012-06-12 DIAGNOSIS — M8580 Other specified disorders of bone density and structure, unspecified site: Secondary | ICD-10-CM

## 2012-06-12 DIAGNOSIS — N952 Postmenopausal atrophic vaginitis: Secondary | ICD-10-CM

## 2012-06-12 DIAGNOSIS — J329 Chronic sinusitis, unspecified: Secondary | ICD-10-CM

## 2012-06-12 DIAGNOSIS — M899 Disorder of bone, unspecified: Secondary | ICD-10-CM

## 2012-06-12 DIAGNOSIS — R05 Cough: Secondary | ICD-10-CM

## 2012-06-12 DIAGNOSIS — R059 Cough, unspecified: Secondary | ICD-10-CM

## 2012-06-12 MED ORDER — FLUCONAZOLE 100 MG PO TABS: ORAL_TABLET | ORAL | Status: DC

## 2012-06-12 MED ORDER — CEFUROXIME AXETIL 250 MG PO TABS: ORAL_TABLET | ORAL | Status: DC

## 2012-06-12 NOTE — Patient Instructions (Addendum)
Sinusitis Sinusitis is redness, soreness, and swelling (inflammation) of the paranasal sinuses. Paranasal sinuses are air pockets within the bones of your face (beneath the eyes, the middle of the forehead, or above the eyes). In healthy paranasal sinuses, mucus is able to drain out, and air is able to circulate through them by way of your nose. However, when your paranasal sinuses are inflamed, mucus and air can become trapped. This can allow bacteria and other germs to grow and cause infection. Sinusitis can develop quickly and last only a short time (acute) or continue over a long period (chronic). Sinusitis that lasts for more than 12 weeks is considered chronic. Fluconazole tablets What is this medicine? FLUCONAZOLE (floo KON na zole) is an antifungal medicine. It is used to treat certain kinds of fungal or yeast infections. This medicine may be used for other purposes; ask your health care provider or pharmacist if you have questions. What should I tell my health care provider before I take this medicine? They need to know if you have any of these conditions: -electrolyte abnormalities -history of irregular heart beat -kidney disease -an unusual or allergic reaction to fluconazole, other azole antifungals, medicines, foods, dyes, or preservatives -pregnant or trying to get pregnant -breast-feeding How should I use this medicine? Take this medicine by mouth. Follow the directions on the prescription label. Do not take your medicine more often than directed. Talk to your pediatrician regarding the use of this medicine in children. Special care may be needed. This medicine has been used in children as young as 53 months of age. Overdosage: If you think you have taken too much of this medicine contact a poison control center or emergency room at once. NOTE: This medicine is only for you. Do not share this medicine with others. What if I miss a dose? If you miss a dose, take it as soon as you  can. If it is almost time for your next dose, take only that dose. Do not take double or extra doses. What may interact with this medicine? Do not take this medicine with any of the following medications: -cisapride -pimozide -red yeast rice This medicine may also interact with the following medications: -birth control pills -cyclosporine -diuretics like hydrochlorothiazide -medicines for diabetes that are taken by mouth -medicines for high cholesterol like atorvastatin, lovastatin or simvastatin -phenytoin -ramelteon -rifabutin -rifampin -some medicines for anxiety or sleep -tacrolimus -terfenadine -theophylline -warfarin This list may not describe all possible interactions. Give your health care provider a list of all the medicines, herbs, non-prescription drugs, or dietary supplements you use. Also tell them if you smoke, drink alcohol, or use illegal drugs. Some items may interact with your medicine. What should I watch for while using this medicine? Visit your doctor or health care professional for regular checkups. If you are taking this medicine for a long time you may need blood work. Tell your doctor if your symptoms do not improve. Some fungal infections need many weeks or months of treatment to cure. Alcohol can increase possible damage to your liver. Avoid alcoholic drinks. If you have a vaginal infection, do not have sex until you have finished your treatment. You can wear a sanitary napkin. Do not use tampons. Wear freshly washed cotton, not synthetic, panties. What side effects may I notice from receiving this medicine? Side effects that you should report to your doctor or health care professional as soon as possible: -allergic reactions like skin rash or itching, hives, swelling of the lips, mouth,  tongue, or throat -dark urine -feeling dizzy or faint -irregular heartbeat or chest pain -redness, blistering, peeling or loosening of the skin, including inside the  mouth -trouble breathing -unusual bruising or bleeding -vomiting -yellowing of the eyes or skin Side effects that usually do not require medical attention (report to your doctor or health care professional if they continue or are bothersome): -changes in how food tastes -diarrhea -headache -stomach upset or nausea This list may not describe all possible side effects. Call your doctor for medical advice about side effects. You may report side effects to FDA at 1-800-FDA-1088. Where should I keep my medicine? Keep out of the reach of children. Store at room temperature below 30 degrees C (86 degrees F). Throw away any medicine after the expiration date. NOTE: This sheet is a summary. It may not cover all possible information. If you have questions about this medicine, talk to your doctor, pharmacist, or health care provider.  2013, [employer]. (05/29/2007 4:59:25 PM)  s displacement of the cartilage that separates your nostrils (deviated septum), which can decrease the air flow through your nose and sinuses and affect sinus drainage.  Functional abnormalities, such as when the small hairs (cilia) that line your sinuses and help remove mucus do not work properly or are not present. SYMPTOMS  Symptoms of acute and chronic sinusitis are the same. The primary symptoms are pain and pressure around the affected sinuses. Other symptoms include:  Upper toothache.  Earache.  Headache.  Bad breath.  Decreased sense of smell and taste.  A cough, which worsens when you are lying flat.  Fatigue.  Fever.  Thick drainage from your nose, which often is green and may contain pus (purulent).  Swelling and warmth over the affected sinuses. DIAGNOSIS  Your caregiver will perform a physical exam. During the exam, your caregiver may:  Look in your nose for signs of abnormal growths in your nostrils (nasal polyps).  Tap over the affected sinus to check for signs of  infection.  View the inside of your sinuses (endoscopy) with a special imaging device with a light attached (endoscope), which is inserted into your sinuses. If your caregiver suspects that you have chronic sinusitis, one or more of the following tests may be recommended:  Allergy tests.  Nasal culture A sample of mucus is taken from your nose and sent to a lab and screened for bacteria.  Nasal cytology A sample of mucus is taken from your nose and examined by your caregiver to determine if your sinusitis is related to an allergy. TREATMENT  Most cases of acute sinusitis are related to a viral infection and will resolve on their own within 10 days. Sometimes medicines are prescribed to help relieve symptoms (pain medicine, decongestants, nasal steroid sprays, or saline sprays).  However, for sinusitis related to a bacterial infection, your caregiver will prescribe antibiotic medicines. These are medicines that will help kill the bacteria causing the infection.  Rarely, sinusitis is caused by a fungal infection. In theses cases, your caregiver will prescribe antifungal medicine. For some cases of chronic sinusitis, surgery is needed. Generally, these are cases in which sinusitis recurs more than 3 times per year, despite other treatments. HOME CARE INSTRUCTIONS   Drink plenty of water. Water helps thin the mucus so your sinuses can drain more easily.  Use a humidifier.  Inhale steam 3 to 4 times a day (for example, sit in the bathroom with the shower running).  Apply a warm, moist washcloth to your  face 3 to 4 times a day, or as directed by your caregiver.  Use saline nasal sprays to help moisten and clean your sinuses.  Take over-the-counter or prescription medicines for pain, discomfort, or fever only as directed by your caregiver. SEEK IMMEDIATE MEDICAL CARE IF:  You have increasing pain or severe headaches.  You have nausea, vomiting, or drowsiness.  You have swelling around your  face.  You have vision problems.  You have a stiff neck.  You have difficulty breathing. MAKE SURE YOU:   Understand these instructions.  Will watch your condition.  Will get help right away if you are not doing well or get worse. Document Released: 01/18/2005 Document Revised: 04/12/2011 Document Reviewed: 02/02/2011 [facility] Patient Information 2013 [place], [place].

## 2012-06-12 NOTE — Progress Notes (Signed)
Kimberly Merritt 11/26/1940 [id number]   History:    72 y.o. who presented to the office today complaining of frontal sinus congestion and nonproductive cough over the past few days. She stated she's had normal Pap smears all her life. She does have a history of total abdominal hysterectomy and right salpingo-oophorectomy for endometriosis in the past.She's been followed by Dr. 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 Her primary physician Dr. Posey Rea has been doing her lab work. Patient's last colonoscopy was in January 2008 and had benign polyps removed by Dr.Maegod. Her followup colonoscopy was this year was reported to be normal. She is on her 5 years cycle.patient states last bone density was study in 2013. She has been diagnosed with having osteopenia in the past. She did stated that in the past they have tried her on Fosamax and Evista and she had adverse reactions to both medications. She's currently just taking vitamin D. 1000 units daily. Patient has refused a shingles vaccine   Past medical history,surgical history, family history and social history were all reviewed and documented in the EPIC chart.  Gynecologic History No LMP recorded. Patient is postmenopausal. Contraception: post menopausal status Last Pap: many years ago. Results were: normal Last mammogram: 2014. Results were: normal  Obstetric History OB History   Grav Para Term Preterm Abortions TAB SAB Ect Mult Living   0                ROS: A ROS was performed and pertinent positives and negatives are included in the history.  GENERAL: No fevers or chills. HEENT: sinus congestion NECK: No pain or stiffness. CARDIOVASCULAR: No chest pain or pressure. No palpitations. PULMONARY: No shortness of breath, cough or wheeze. GASTROINTESTINAL: No abdominal pain, nausea, vomiting or diarrhea, melena or bright red blood per rectum. GENITOURINARY: No urinary frequency,  urgency, hesitancy or dysuria. MUSCULOSKELETAL: No joint or muscle pain, no back pain, no recent trauma. DERMATOLOGIC: No rash, no itching, no lesions. ENDOCRINE: No polyuria, polydipsia, no heat or cold intolerance. No recent change in weight. HEMATOLOGICAL: No anemia or easy bruising or bleeding. NEUROLOGIC: No headache, seizures, numbness, tingling or weakness. PSYCHIATRIC: No depression, no loss of interest in normal activity or change in sleep pattern.     Exam: chaperone present  BP 138/76  Ht 5' 3.25" (1.607 m)  Wt 129 lb (58.514 kg)  BMI 22.66 kg/m2  Body mass index is 22.66 kg/(m^2).  General appearance : Well developed well nourished female. No acute distress HEENT:tender frontal and maxillary sinusesLungs: Clear to auscultation, no rhonchi or wheezes, or rib retractions  Heart: Regular rate and rhythm, no murmurs or gallops Breast:Examined in sitting and supine position were symmetrical in appearance, no palpable masses or tenderness,  no skin retraction, no nipple inversion, no nipple discharge, no skin discoloration, no axillary or supraclavicular lymphadenopathy Abdomen: no palpable masses or tenderness, no rebound or guarding Extremities: no edema or skin discoloration or tenderness  Pelvic:  Bartholin, Urethra, Skene Glands: Within normal limits             Vagina: No gross lesions or discharge, vaginal atrophy  Cervix:absent  Uterus Absent  Adnexa  Without masses or tenderness  Anus and perineum  normal   Rectovaginal  normal sphincter tone without palpated masses or tenderness             Hemoccult cards provided     Assessment/Plan:  72 y.o.  female with nonproductive cough and tender frontal and maxillary sinuses will be treated for sinusitis with Ceftin 500 mg one by mouth twice a day for 7 days. I asked her to obtain a copy of her last bone density study from Dr. Posey Rea office. She is not need a bone density study until next year. She states her vaginal atrophy  is not causing her much problems. We discussed importance of calcium and vitamin D and regular exercise for osteoporosis prevention. Patient refused shingles vaccine and states that all her other vaccinations are up-to-date.    Ok Edwards MD, 5:17 PM 06/12/2012

## 2012-06-20 ENCOUNTER — Encounter: Payer: Self-pay | Admitting: Internal Medicine

## 2012-06-20 ENCOUNTER — Ambulatory Visit (INDEPENDENT_AMBULATORY_CARE_PROVIDER_SITE_OTHER): Payer: Medicare Other | Admitting: Internal Medicine

## 2012-06-20 VITALS — BP 156/84 | HR 72 | Temp 97.7°F | Resp 16 | Wt 131.0 lb

## 2012-06-20 DIAGNOSIS — J019 Acute sinusitis, unspecified: Secondary | ICD-10-CM

## 2012-06-20 DIAGNOSIS — J309 Allergic rhinitis, unspecified: Secondary | ICD-10-CM

## 2012-06-20 DIAGNOSIS — K219 Gastro-esophageal reflux disease without esophagitis: Secondary | ICD-10-CM

## 2012-06-20 DIAGNOSIS — R209 Unspecified disturbances of skin sensation: Secondary | ICD-10-CM

## 2012-06-20 DIAGNOSIS — M545 Low back pain, unspecified: Secondary | ICD-10-CM

## 2012-06-20 DIAGNOSIS — R202 Paresthesia of skin: Secondary | ICD-10-CM | POA: Insufficient documentation

## 2012-06-20 MED ORDER — AMOXICILLIN-POT CLAVULANATE 875-125 MG PO TABS: 1.0000 | ORAL_TABLET | Freq: Two times a day (BID) | ORAL | Status: DC

## 2012-06-20 NOTE — Assessment & Plan Note (Signed)
Refractory - start augmentin 2 wks

## 2012-06-20 NOTE — Assessment & Plan Note (Signed)
Continue with current prescription therapy as reflected on the Med list. On shots

## 2012-06-20 NOTE — Assessment & Plan Note (Addendum)
Continue with current prescription therapy as reflected on the Med list. Thoracic back is bothering now: had xrays w/Dr Phoebe Perch

## 2012-06-20 NOTE — Progress Notes (Signed)
Patient ID: Kimberly Merritt, female   DOB: 1940/08/15, 72 y.o.   MRN: [id number]   Subjective:    HPI  The patient is here to follow up on chronic allergies depression, anxiety, headaches, osteopenia and chronic moderate fibromyalgia and OA symptoms controlled partially with medicines, diet and exercise.  BP Readings from Last 3 Encounters:  06/20/12 156/84  06/12/12 138/76  02/21/12 130/70   Wt Readings from Last 3 Encounters:  06/20/12 131 lb (59.421 kg)  06/12/12 129 lb (58.514 kg)  02/21/12 130 lb (58.968 kg)       Review of Systems  Constitutional: Positive for fatigue. Negative for chills, activity change, appetite change and unexpected weight change.  HENT: Negative for congestion, mouth sores and sinus pressure.   Eyes: Negative for visual disturbance.  Respiratory: Negative for cough and chest tightness.   Gastrointestinal: Negative for nausea and abdominal pain.  Genitourinary: Negative for frequency, difficulty urinating and vaginal pain.  Musculoskeletal: Positive for myalgias and arthralgias. Negative for back pain and gait problem.  Skin: Negative for pallor and rash.  Neurological: Negative for dizziness, tremors, weakness, numbness and headaches.  Psychiatric/Behavioral: Negative for confusion and sleep disturbance. The patient is nervous/anxious.        Objective:   Physical Exam  Constitutional: She appears well-developed and well-nourished. No distress.  HENT:  Head: Normocephalic.  Right Ear: External ear normal.  Left Ear: External ear normal.  Nose: Nose normal.  Mouth/Throat: Oropharynx is clear and moist.  Eyes: Conjunctivae are normal. Pupils are equal, round, and reactive to light. Right eye exhibits no discharge. Left eye exhibits no discharge.  Neck: Normal range of motion. Neck supple. No JVD present. No tracheal deviation present. No thyromegaly present.  Cardiovascular: Normal rate, regular rhythm and normal heart sounds.   Pulmonary/Chest:  No stridor. No respiratory distress. She has no wheezes.  Abdominal: Soft. Bowel sounds are normal. She exhibits no distension and no mass. There is no tenderness. There is no rebound and no guarding.  Musculoskeletal: She exhibits edema (trace) and tenderness.  Lymphadenopathy:    She has no cervical adenopathy.  Neurological: She displays normal reflexes. No cranial nerve deficit. She exhibits normal muscle tone. Coordination normal.  Skin: No rash noted. No erythema.  Spider veins on B LEs  Psychiatric: She has a normal mood and affect. Her behavior is normal. Judgment and thought content normal.   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

## 2012-06-20 NOTE — Assessment & Plan Note (Signed)
Continue with current prescription therapy as reflected on the Med list.  

## 2012-06-20 NOTE — Assessment & Plan Note (Signed)
Long term more in R foot - poss radiculopathy Labs to r/o other causes

## 2012-06-29 ENCOUNTER — Other Ambulatory Visit: Payer: Self-pay | Admitting: Anesthesiology

## 2012-06-29 DIAGNOSIS — Z1211 Encounter for screening for malignant neoplasm of colon: Secondary | ICD-10-CM

## 2012-09-15 ENCOUNTER — Other Ambulatory Visit (INDEPENDENT_AMBULATORY_CARE_PROVIDER_SITE_OTHER): Payer: Medicare Other

## 2012-09-15 DIAGNOSIS — E039 Hypothyroidism, unspecified: Secondary | ICD-10-CM

## 2012-09-15 DIAGNOSIS — M545 Low back pain, unspecified: Secondary | ICD-10-CM

## 2012-09-15 DIAGNOSIS — R202 Paresthesia of skin: Secondary | ICD-10-CM

## 2012-09-15 DIAGNOSIS — R209 Unspecified disturbances of skin sensation: Secondary | ICD-10-CM

## 2012-09-15 DIAGNOSIS — J309 Allergic rhinitis, unspecified: Secondary | ICD-10-CM

## 2012-09-15 DIAGNOSIS — J019 Acute sinusitis, unspecified: Secondary | ICD-10-CM

## 2012-09-15 DIAGNOSIS — K219 Gastro-esophageal reflux disease without esophagitis: Secondary | ICD-10-CM

## 2012-09-15 LAB — BASIC METABOLIC PANEL
BUN: 14 mg/dL (ref 6–23)
CO2: 31 mEq/L (ref 19–32)
Calcium: 9 mg/dL (ref 8.4–10.5)
Chloride: 102 mEq/L (ref 96–112)
Creatinine, Ser: 0.8 mg/dL (ref 0.4–1.2)
GFR: 73.8 mL/min (ref >60.00)
Glucose, Bld: 89 mg/dL (ref 70–99)
Potassium: 4.7 mEq/L (ref 3.5–5.1)
Sodium: 137 mEq/L (ref 135–145)

## 2012-09-15 LAB — VITAMIN B12: Vitamin B-12: 704 pg/mL (ref 211–911)

## 2012-09-15 LAB — SEDIMENTATION RATE: Sed Rate: 31 mm/hr — ABNORMAL HIGH (ref 0–22)

## 2012-09-15 LAB — TSH: TSH: 1.06 u[IU]/mL (ref 0.35–5.50)

## 2012-09-20 ENCOUNTER — Encounter: Payer: Self-pay | Admitting: Internal Medicine

## 2012-09-20 ENCOUNTER — Ambulatory Visit (INDEPENDENT_AMBULATORY_CARE_PROVIDER_SITE_OTHER): Payer: Medicare Other | Admitting: Internal Medicine

## 2012-09-20 VITALS — BP 110/70 | HR 72 | Temp 98.5°F | Resp 16 | Wt 129.0 lb

## 2012-09-20 DIAGNOSIS — D472 Monoclonal gammopathy: Secondary | ICD-10-CM

## 2012-09-20 DIAGNOSIS — M545 Low back pain, unspecified: Secondary | ICD-10-CM

## 2012-09-20 DIAGNOSIS — J309 Allergic rhinitis, unspecified: Secondary | ICD-10-CM

## 2012-09-20 DIAGNOSIS — E039 Hypothyroidism, unspecified: Secondary | ICD-10-CM

## 2012-09-20 NOTE — Assessment & Plan Note (Signed)
Continue with current prescription therapy as reflected on the Med list.  

## 2012-09-20 NOTE — Progress Notes (Signed)
   Subjective:     HPI  C/o flushing and itching when she took a high dose of hydrocodone. F/u allergies  The patient is here to follow up on chronic allergies depression, anxiety, headaches, osteopenia and chronic moderate fibromyalgia and OA symptoms controlled partially with medicines, diet and exercise.  BP Readings from Last 3 Encounters:  09/20/12 110/70  06/20/12 156/84  06/12/12 138/76   Wt Readings from Last 3 Encounters:  09/20/12 129 lb (58.514 kg)  06/20/12 131 lb (59.421 kg)  06/12/12 129 lb (58.514 kg)       Review of Systems  Constitutional: Positive for fatigue. Negative for chills, activity change, appetite change and unexpected weight change.  HENT: Negative for congestion, mouth sores and sinus pressure.   Eyes: Negative for visual disturbance.  Respiratory: Negative for cough and chest tightness.   Gastrointestinal: Negative for nausea and abdominal pain.  Genitourinary: Negative for frequency, difficulty urinating and vaginal pain.  Musculoskeletal: Positive for myalgias and arthralgias. Negative for back pain and gait problem.  Skin: Negative for pallor and rash.  Neurological: Negative for dizziness, tremors, weakness, numbness and headaches.  Psychiatric/Behavioral: Negative for confusion and sleep disturbance. The patient is nervous/anxious.        Objective:   Physical Exam  Constitutional: She appears well-developed and well-nourished. No distress.  HENT:  Head: Normocephalic.  Right Ear: External ear normal.  Left Ear: External ear normal.  Nose: Nose normal.  Mouth/Throat: Oropharynx is clear and moist.  Eyes: Conjunctivae are normal. Pupils are equal, round, and reactive to light. Right eye exhibits no discharge. Left eye exhibits no discharge.  Neck: Normal range of motion. Neck supple. No JVD present. No tracheal deviation present. No thyromegaly present.  Cardiovascular: Normal rate, regular rhythm and normal heart sounds.    Pulmonary/Chest: No stridor. No respiratory distress. She has no wheezes.  Abdominal: Soft. Bowel sounds are normal. She exhibits no distension and no mass. There is no tenderness. There is no rebound and no guarding.  Musculoskeletal: She exhibits edema (trace) and tenderness.  Lymphadenopathy:    She has no cervical adenopathy.  Neurological: She displays normal reflexes. No cranial nerve deficit. She exhibits normal muscle tone. Coordination normal.  Skin: No rash noted. No erythema.  Spider veins on B LEs  Psychiatric: She has a normal mood and affect. Her behavior is normal. Judgment and thought content normal.   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

## 2012-09-20 NOTE — Patient Instructions (Signed)
Gluten free trial (no wheat products) for 4-6 weeks. OK to use gluten-free bread and gluten-free pasta.  Milk free trial (no milk, ice cream, cheese and yogurt) for 4-6 weeks. OK to use almond, coconut, rice or soy milk.  

## 2012-12-20 ENCOUNTER — Ambulatory Visit: Payer: Medicare Other | Admitting: Internal Medicine

## 2012-12-26 ENCOUNTER — Other Ambulatory Visit: Payer: Self-pay | Admitting: Gastroenterology

## 2013-01-16 ENCOUNTER — Other Ambulatory Visit: Payer: Self-pay | Admitting: Gastroenterology

## 2013-01-16 DIAGNOSIS — R109 Unspecified abdominal pain: Secondary | ICD-10-CM

## 2013-01-19 ENCOUNTER — Ambulatory Visit
Admission: RE | Admit: 2013-01-19 | Discharge: 2013-01-19 | Disposition: A | Discharge status: Home / Self Care | Payer: Medicare Other | Source: Ambulatory Visit | Attending: Gastroenterology | Admitting: Gastroenterology

## 2013-01-19 DIAGNOSIS — R109 Unspecified abdominal pain: Secondary | ICD-10-CM

## 2013-02-26 ENCOUNTER — Other Ambulatory Visit: Payer: Self-pay | Admitting: Internal Medicine

## 2013-04-18 ENCOUNTER — Encounter: Payer: Self-pay | Admitting: Internal Medicine

## 2013-04-18 ENCOUNTER — Other Ambulatory Visit: Payer: Medicare Other

## 2013-04-18 ENCOUNTER — Ambulatory Visit (INDEPENDENT_AMBULATORY_CARE_PROVIDER_SITE_OTHER): Payer: Medicare Other | Admitting: Internal Medicine

## 2013-04-18 VITALS — BP 160/90 | HR 72 | Temp 96.8°F | Resp 16 | Wt 126.0 lb

## 2013-04-18 DIAGNOSIS — M1990 Unspecified osteoarthritis, unspecified site: Secondary | ICD-10-CM

## 2013-04-18 DIAGNOSIS — K219 Gastro-esophageal reflux disease without esophagitis: Secondary | ICD-10-CM

## 2013-04-18 DIAGNOSIS — D472 Monoclonal gammopathy: Secondary | ICD-10-CM

## 2013-04-18 DIAGNOSIS — M545 Low back pain, unspecified: Secondary | ICD-10-CM

## 2013-04-18 DIAGNOSIS — M25519 Pain in unspecified shoulder: Secondary | ICD-10-CM

## 2013-04-18 DIAGNOSIS — E039 Hypothyroidism, unspecified: Secondary | ICD-10-CM

## 2013-04-18 DIAGNOSIS — Z23 Encounter for immunization: Secondary | ICD-10-CM

## 2013-04-18 DIAGNOSIS — M25511 Pain in right shoulder: Secondary | ICD-10-CM

## 2013-04-18 NOTE — Patient Instructions (Addendum)
   Milk free trial (no milk, ice cream, cheese and yogurt) for 4-6 weeks. OK to use almond, coconut, rice milk. "Almond breeze" brand tastes good.  

## 2013-04-18 NOTE — Progress Notes (Signed)
Pre visit review using our clinic review tool, if applicable. No additional management support is needed unless otherwise documented below in the visit note. 

## 2013-04-18 NOTE — Assessment & Plan Note (Signed)
Continue with current prescription therapy as reflected on the Med list.  

## 2013-04-18 NOTE — Assessment & Plan Note (Signed)
Dr Latanya Maudlin

## 2013-04-18 NOTE — Assessment & Plan Note (Addendum)
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

## 2013-04-18 NOTE — Assessment & Plan Note (Signed)
Ortho appt today Continue with current prescription therapy as reflected on the Med list.

## 2013-04-18 NOTE — Progress Notes (Signed)
   Subjective:     HPI  C/o LBP, R hip pain - seeing Dr Latanya Maudlin  F/u flushing and itching when she took a high dose of hydrocodone. F/u allergies  The patient is here to follow up on chronic allergies depression, anxiety, headaches, osteopenia and chronic moderate fibromyalgia and OA symptoms controlled partially with medicines, diet and exercise.  BP Readings from Last 3 Encounters:  04/18/13 160/90  09/20/12 110/70  06/20/12 156/84   Wt Readings from Last 3 Encounters:  04/18/13 126 lb (57.153 kg)  09/20/12 129 lb (58.514 kg)  06/20/12 131 lb (59.421 kg)       Review of Systems  Constitutional: Positive for fatigue. Negative for chills, activity change, appetite change and unexpected weight change.  HENT: Negative for congestion, mouth sores and sinus pressure.   Eyes: Negative for visual disturbance.  Respiratory: Negative for cough and chest tightness.   Gastrointestinal: Negative for nausea and abdominal pain.  Genitourinary: Negative for frequency, difficulty urinating and vaginal pain.  Musculoskeletal: Positive for arthralgias and myalgias. Negative for back pain and gait problem.  Skin: Negative for pallor and rash.  Neurological: Negative for dizziness, tremors, weakness, numbness and headaches.  Psychiatric/Behavioral: Negative for confusion and sleep disturbance. The patient is nervous/anxious.        Objective:   Physical Exam  Constitutional: She appears well-developed and well-nourished. No distress.  HENT:  Head: Normocephalic.  Right Ear: External ear normal.  Left Ear: External ear normal.  Nose: Nose normal.  Mouth/Throat: Oropharynx is clear and moist.  Eyes: Conjunctivae are normal. Pupils are equal, round, and reactive to light. Right eye exhibits no discharge. Left eye exhibits no discharge.  Neck: Normal range of motion. Neck supple. No JVD present. No tracheal deviation present. No thyromegaly present.  Cardiovascular: Normal rate, regular  rhythm and normal heart sounds.   Pulmonary/Chest: No stridor. No respiratory distress. She has no wheezes.  Abdominal: Soft. Bowel sounds are normal. She exhibits no distension and no mass. There is no tenderness. There is no rebound and no guarding.  Musculoskeletal: She exhibits edema (trace) and tenderness.  Lymphadenopathy:    She has no cervical adenopathy.  Neurological: She displays normal reflexes. No cranial nerve deficit. She exhibits normal muscle tone. Coordination normal.  Skin: No rash noted. No erythema.  Spider veins on B LEs  Psychiatric: She has a normal mood and affect. Her behavior is normal. Judgment and thought content normal.   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

## 2013-04-20 ENCOUNTER — Telehealth: Payer: Self-pay | Admitting: Oncology

## 2013-04-20 NOTE — Telephone Encounter (Signed)
CALLED PATIENT TO SCHEDULE NEW PATIENT APPT PER PATIENT SHE HAS SEEN DR. MAGRINAT AND WOULD STILL LIKE TO CONTINUE. INFORMED PATIENT DR. MAGRINAT WAS FOCUSING ON BREAST CANCER AND THAT I WOULD HAVE SPEAK WITH HIM BEFORE SCHEDULING ANY APPTS.

## 2013-04-24 ENCOUNTER — Other Ambulatory Visit (INDEPENDENT_AMBULATORY_CARE_PROVIDER_SITE_OTHER): Payer: Medicare Other

## 2013-04-24 DIAGNOSIS — D472 Monoclonal gammopathy: Secondary | ICD-10-CM

## 2013-04-24 LAB — CBC WITH DIFFERENTIAL/PLATELET
Basophils Absolute: 0 10*3/uL (ref 0.0–0.1)
Basophils Relative: 0.5 % (ref 0.0–3.0)
Eosinophils Absolute: 0.2 10*3/uL (ref 0.0–0.7)
Eosinophils Relative: 1.8 % (ref 0.0–5.0)
HCT: 41.9 % (ref 36.0–46.0)
Hemoglobin: 14.1 g/dL (ref 12.0–15.0)
Lymphocytes Relative: 33.9 % (ref 12.0–46.0)
Lymphs Abs: 3.4 10*3/uL (ref 0.7–4.0)
MCHC: 33.7 g/dL (ref 30.0–36.0)
MCV: 92 fl (ref 78.0–100.0)
Monocytes Absolute: 1.3 10*3/uL — ABNORMAL HIGH (ref 0.1–1.0)
Monocytes Relative: 12.9 % — ABNORMAL HIGH (ref 3.0–12.0)
Neutro Abs: 5.1 10*3/uL (ref 1.4–7.7)
Neutrophils Relative %: 50.9 % (ref 43.0–77.0)
Platelets: 371 10*3/uL (ref 150.0–400.0)
RBC: 4.55 Mil/uL (ref 3.87–5.11)
RDW: 13.4 % (ref 11.5–14.6)
WBC: 10 10*3/uL (ref 4.5–10.5)

## 2013-04-24 LAB — SEDIMENTATION RATE: Sed Rate: 28 mm/hr — ABNORMAL HIGH (ref 0–22)

## 2013-04-26 ENCOUNTER — Telehealth: Payer: Self-pay | Admitting: Oncology

## 2013-04-26 ENCOUNTER — Other Ambulatory Visit: Payer: Self-pay | Admitting: Oncology

## 2013-04-26 DIAGNOSIS — D472 Monoclonal gammopathy: Secondary | ICD-10-CM

## 2013-04-26 NOTE — Telephone Encounter (Signed)
per pof Dr sch a 4:00 appt for pt/MD had to override appt slot/will mail pt sche of appt

## 2013-04-30 ENCOUNTER — Telehealth: Payer: Self-pay | Admitting: Oncology

## 2013-04-30 NOTE — Telephone Encounter (Signed)
, °

## 2013-05-01 ENCOUNTER — Other Ambulatory Visit: Payer: Self-pay

## 2013-05-01 DIAGNOSIS — Z1231 Encounter for screening mammogram for malignant neoplasm of breast: Secondary | ICD-10-CM

## 2013-05-02 ENCOUNTER — Ambulatory Visit (INDEPENDENT_AMBULATORY_CARE_PROVIDER_SITE_OTHER): Payer: Medicare Other | Admitting: General Surgery

## 2013-05-02 ENCOUNTER — Encounter (INDEPENDENT_AMBULATORY_CARE_PROVIDER_SITE_OTHER): Payer: Self-pay | Admitting: General Surgery

## 2013-05-02 ENCOUNTER — Telehealth (INDEPENDENT_AMBULATORY_CARE_PROVIDER_SITE_OTHER): Payer: Self-pay | Admitting: *Deleted

## 2013-05-02 VITALS — BP 138/80 | HR 78 | Temp 98.7°F | Resp 16 | Ht 64.0 in | Wt 126.4 lb

## 2013-05-02 DIAGNOSIS — K5730 Diverticulosis of large intestine without perforation or abscess without bleeding: Secondary | ICD-10-CM | POA: Insufficient documentation

## 2013-05-02 NOTE — Progress Notes (Signed)
Patient ID: Kimberly Merritt, female   DOB: [birth year], 73 y.o.   MRN: [id number]  Chief Complaint  Patient presents with  . New Evaluation    eval left side stricture and diverticulitis    HPI Kimberly Merritt is a 73 y.o. female.  Chief complaint: Diverticulosis HPI She is a long-term patient of Dr. Watt Climes at [facility]. She has known diverticulosis, especially involving the sigmoid. She has chronic problems with alternating constipation and diarrhea. She had an episode of C. Difficile colitis last December which was treated as an outpatient. She has recovered from that. She occasionally has suprapubic abdominal pain. She wonders if it is from adhesions from her appendectomy or hysterectomy. Dr. Watt Climes asked [place] to consider colectomy to help her symptoms.She has no known episodes of diverticulitis and certainly has not been hospitalized for that. I reviewed her CT scan from December of 2014 as well. Past Medical History  Diagnosis Date  . HYPOTHYROIDISM 08/29/2006  . 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 SINUSITIS, ACUTE 01/23/2007  . ALLERGIC RHINITIS 04/25/2007  . GERD 08/29/2006  . HEPATIC CYST 08/02/2007  . OSTEOARTHRITIS 08/29/2006  . LOW BACK PAIN 08/29/2006  . Pain in Soft Tissues of Limb 08/29/2006  . OSTEOPENIA 11/19/2008  . PARESTHESIA 03/05/2009  . LIVER HEMANGIOMA 03/03/2010  . 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 MUSCLE SPASM 03/03/2010  . DEPRESSION 08/29/2006    Past Surgical History  Procedure Laterality Date  . Lumbar laminectomy      L3-4, 4-5 and L5-S1  . Tonsillectomy    . Abdominal hysterectomy    . Appendectomy    . Carpal tunnel release      both  . Bunionectomy      both  . Back surgery  2010,2006    lumb fusion  . Cataract extraction      both  . Shoulder arthroscopy  02/09/2011    Procedure: ARTHROSCOPY SHOULDER;  Surgeon: Hessie Dibble, MD;  Location: [place];  Service: Orthopedics;  Laterality: Right;  right shoulder arthroscopy  subacromial decompression with rotator cuff repair    Family History  Problem Relation Age of Onset  . Hypertension Other   . Stroke Mother     Social History History  Substance Use Topics  . Smoking status: Never Smoker   . Smokeless tobacco: Never Used  . Alcohol Use: No    Allergies  Allergen Reactions  . Acetaminophen     REACTION: high dose - tremor  . Alendronate Sodium   . Contrast Media [Iodinated Diagnostic Agents]   . Doxycycline     n/v  . Oxybutynin   . Raloxifene   . Sulfonamide Derivatives   . Tramadol Hcl     Current Outpatient Prescriptions  Medication Sig Dispense Refill  . albuterol (PROAIR HFA) 108 (90 BASE) MCG/ACT inhaler Inhale 1-2 puffs into the lungs every 4 (four) hours as needed.  18 g  1  . Cholecalciferol (VITAMIN D3) 1000 UNITS CAPS Take by mouth daily.        Marland Kitchen glucosamine-chondroitin 500-400 MG tablet Take 1 tablet by mouth daily.        . Loratadine-Pseudoephedrine (CLARITIN-D 12 HOUR PO) Take by mouth.      . meloxicam (MOBIC) 7.5 MG tablet       . methocarbamol (ROBAXIN) 500 MG tablet Take 500 mg by mouth every 6 (six) hours as needed.        . NON FORMULARY Allergy injections       .  pantoprazole (PROTONIX) 40 MG tablet Take 1 tablet (40 mg total) by mouth daily.  30 tablet  3  . SYNTHROID 25 MCG tablet TAKE 1 TABLET BY MOUTH DAILY.  30 tablet  5   No current facility-administered medications for this visit.    Review of Systems Review of Systems  Constitutional: Negative.   HENT: Negative.   Eyes: Negative.   Respiratory: Negative.   Cardiovascular: Negative.   Gastrointestinal:       See history of present illness  Endocrine: Negative.   Genitourinary: Negative.   Musculoskeletal:       Chronic back pain, previous back surgery  Allergic/Immunologic: Negative.   Neurological: Negative.   Hematological: Negative.     Blood pressure 138/80, pulse 78, temperature 98.7 F (37.1 C), temperature source Temporal, resp. rate  16, height 5\' 4"  (1.626 m), weight 126 lb 6.4 oz (57.335 kg).  Physical Exam Physical Exam  Constitutional: She is oriented to person, place, and time. She appears well-developed and well-nourished. No distress.  HENT:  Head: Normocephalic and atraumatic.  Mouth/Throat: Oropharynx is clear and moist. No oropharyngeal exudate.  Eyes: EOM are normal. Pupils are equal, round, and reactive to light.  Neck: Normal range of motion. No tracheal deviation present.  Cardiovascular: Normal rate, regular rhythm, normal heart sounds and intact distal pulses.   Pulmonary/Chest: Effort normal and breath sounds normal. No stridor. No respiratory distress. She has no wheezes. She has no rales.  Abdominal: Soft. She exhibits no distension. There is no tenderness. There is no rebound and no guarding.  Right lower quadrant scar, Pfannenstiel scar, no hernias, no tenderness, no masses  Musculoskeletal: Normal range of motion. She exhibits no tenderness.  Neurological: She is alert and oriented to person, place, and time. She exhibits normal muscle tone. Coordination normal.  Skin: Skin is warm and dry.  Psychiatric: She has a normal mood and affect.    Data Reviewed Office notes from gastroenterology, CT as above  Assessment    Diverticulosis, chronic alternating constipation and diarrhea    Plan    Schedule Gastrografin enema to further evaluate her colon and especially look for any strictures. I will see her back after this test to discuss possible surgical options.       Phyllistine Domingos E 05/02/2013, 11:07 AM

## 2013-05-02 NOTE — Telephone Encounter (Signed)
Order for DG Colon w/cm-wo/w kub  No precert required Appt. 4.10.15 arrive 7:45 a.m. appt 8:00 a.m. Pt aware per Stacie Glaze

## 2013-05-11 ENCOUNTER — Ambulatory Visit
Admission: RE | Admit: 2013-05-11 | Discharge: 2013-05-11 | Disposition: A | Discharge status: Home / Self Care | Payer: Medicare Other | Source: Ambulatory Visit | Attending: General Surgery | Admitting: General Surgery

## 2013-05-11 DIAGNOSIS — K5730 Diverticulosis of large intestine without perforation or abscess without bleeding: Secondary | ICD-10-CM

## 2013-05-17 ENCOUNTER — Encounter (INDEPENDENT_AMBULATORY_CARE_PROVIDER_SITE_OTHER): Payer: Self-pay

## 2013-05-18 ENCOUNTER — Encounter (INDEPENDENT_AMBULATORY_CARE_PROVIDER_SITE_OTHER): Payer: Self-pay | Admitting: General Surgery

## 2013-05-18 ENCOUNTER — Ambulatory Visit (INDEPENDENT_AMBULATORY_CARE_PROVIDER_SITE_OTHER): Payer: Medicare Other | Admitting: General Surgery

## 2013-05-18 VITALS — BP 110/70 | HR 74 | Temp 97.4°F | Resp 12 | Wt 122.0 lb

## 2013-05-18 DIAGNOSIS — K5730 Diverticulosis of large intestine without perforation or abscess without bleeding: Secondary | ICD-10-CM

## 2013-05-18 NOTE — Patient Instructions (Signed)
I will call you next week after I speak with Dr. Watt Climes and after your appointment with Dr. Sherwood Gambler

## 2013-05-18 NOTE — Progress Notes (Signed)
Subjective:     Patient ID: Kimberly Merritt, female   DOB: September 27, 1940, 73 y.o.   MRN: [id number]  HPI I saw her last week regarding sigmoid diverticulosis. She has complaints of alternating constipation and diarrhea with occasional lower abdominal pain. I sent her for contrast enema. This revealed several sigmoid diverticuli but no stricture or other colon abnormality. Of note, she is having some significant back issues and has seen Dr. Sherwood Gambler next Wednesday.  Review of Systems     Objective:   Physical Exam  Constitutional: She appears well-developed and well-nourished.  HENT:  Head: Normocephalic.  Neck: Normal range of motion.  Cardiovascular: Normal rate and normal heart sounds.   Pulmonary/Chest: Effort normal and breath sounds normal.  Abdominal: Soft. She exhibits no distension. There is tenderness. There is no rebound and no guarding.  Minimal suprapubic tenderness to deep palpation       Assessment:     Sigmoid diverticulosis    Plan:     We again discussed the option of surgery. I am uncertain at this time if her symptoms would be significantly changed by removing her sigmoid colon. She does not have any stricture associated with her diverticuli and has not had any significant episodes of diverticulitis. I plan to discuss this further with Dr. Watt Climes next week and then I will call her back after she is evaluated by Dr. Sherwood Gambler regarding her back.

## 2013-05-25 ENCOUNTER — Telehealth (INDEPENDENT_AMBULATORY_CARE_PROVIDER_SITE_OTHER): Payer: Self-pay | Admitting: General Surgery

## 2013-05-25 NOTE — Telephone Encounter (Signed)
I discussed her symptoms. Her abdomen is feeling better. She is having back pain and is set to undergo MRI per Dr. Sherwood Gambler. She mostly C/O constipation now. She wants to hold off on colectomy for now and I agree it is not acutely indicated. She will call if her symptoms change.

## 2013-05-28 ENCOUNTER — Other Ambulatory Visit: Payer: Self-pay | Admitting: Neurosurgery

## 2013-05-28 DIAGNOSIS — M47812 Spondylosis without myelopathy or radiculopathy, cervical region: Secondary | ICD-10-CM

## 2013-05-28 DIAGNOSIS — M546 Pain in thoracic spine: Secondary | ICD-10-CM

## 2013-06-02 ENCOUNTER — Ambulatory Visit
Admission: RE | Admit: 2013-06-02 | Discharge: 2013-06-02 | Disposition: A | Discharge status: Home / Self Care | Payer: Medicare Other | Source: Ambulatory Visit | Attending: Neurosurgery | Admitting: Neurosurgery

## 2013-06-02 DIAGNOSIS — M47812 Spondylosis without myelopathy or radiculopathy, cervical region: Secondary | ICD-10-CM

## 2013-06-02 DIAGNOSIS — M546 Pain in thoracic spine: Secondary | ICD-10-CM

## 2013-06-07 ENCOUNTER — Ambulatory Visit: Payer: Medicare Other

## 2013-06-08 ENCOUNTER — Ambulatory Visit
Admission: RE | Admit: 2013-06-08 | Discharge: 2013-06-08 | Disposition: A | Discharge status: Home / Self Care | Payer: Medicare Other | Source: Ambulatory Visit

## 2013-06-08 ENCOUNTER — Encounter (INDEPENDENT_AMBULATORY_CARE_PROVIDER_SITE_OTHER): Payer: Self-pay

## 2013-06-08 DIAGNOSIS — Z1231 Encounter for screening mammogram for malignant neoplasm of breast: Secondary | ICD-10-CM

## 2013-06-12 ENCOUNTER — Telehealth: Payer: Self-pay | Admitting: Oncology

## 2013-06-12 NOTE — Telephone Encounter (Signed)
per [facility] move pt appt to 8/25-left message with pt machine-will mail sch to pt

## 2013-06-12 NOTE — Telephone Encounter (Signed)
pt cld back to see why appt moved/adv pt that Gm moved appt to Aug but if she felt she needed to be seen before then to call us. Pt stated Aug was good.Adv had mailed her a sch

## 2013-06-14 ENCOUNTER — Encounter: Payer: Self-pay | Admitting: Gynecology

## 2013-06-14 ENCOUNTER — Ambulatory Visit (INDEPENDENT_AMBULATORY_CARE_PROVIDER_SITE_OTHER): Payer: Medicare Other | Admitting: Gynecology

## 2013-06-14 VITALS — BP 138/0 | Ht 63.25 in | Wt 127.0 lb

## 2013-06-14 DIAGNOSIS — N952 Postmenopausal atrophic vaginitis: Secondary | ICD-10-CM

## 2013-06-14 DIAGNOSIS — N362 Urethral caruncle: Secondary | ICD-10-CM

## 2013-06-14 DIAGNOSIS — M8580 Other specified disorders of bone density and structure, unspecified site: Secondary | ICD-10-CM

## 2013-06-14 DIAGNOSIS — M949 Disorder of cartilage, unspecified: Secondary | ICD-10-CM

## 2013-06-14 DIAGNOSIS — M899 Disorder of bone, unspecified: Secondary | ICD-10-CM

## 2013-06-14 NOTE — Patient Instructions (Signed)
Shingles Vaccine  What You Need to Know  WHAT IS SHINGLES?  · Shingles is a painful skin rash, often with blisters. It is also called Herpes Zoster or just Zoster.  · A shingles rash usually appears on one side of the face or body and lasts from 2 to 4 weeks. Its main symptom is pain, which can be quite severe. Other symptoms of shingles can include fever, headache, chills, and upset stomach. Very rarely, a shingles infection can lead to pneumonia, hearing problems, blindness, brain inflammation (encephalitis), or death.  · For about 1 person in 5, severe pain can continue even after the rash clears up. This is called post-herpetic neuralgia.  · Shingles is caused by the Varicella Zoster virus. This is the same virus that causes chickenpox. Only someone who has had a case of chickenpox or rarely, has gotten chickenpox vaccine, can get shingles. The virus stays in your body. It can reappear many years later to cause a case of shingles.  · You cannot catch shingles from another person with shingles. However, a person who has never had chickenpox (or chickenpox vaccine) could get chickenpox from someone with shingles. This is not very common.  · Shingles is far more common in people 50 and older than in younger people. It is also more common in people whose immune systems are weakened because of a disease such as cancer or drugs such as steroids or chemotherapy.  · At least 1 million people get shingles per year in the [place].  SHINGLES VACCINE  · A vaccine for shingles was licensed in 2006. In clinical trials, the vaccine reduced the risk of shingles by 50%. It can also reduce the pain in people who still get shingles after being vaccinated.  · A single dose of shingles vaccine is recommended for adults 60 years of age and older.  SOME PEOPLE SHOULD NOT GET SHINGLES VACCINE OR SHOULD WAIT  A person should not get shingles vaccine if he or she:  · Has ever had a life-threatening allergic reaction to gelatin, the  antibiotic neomycin, or any other component of shingles vaccine. Tell your caregiver if you have any severe allergies.  · Has a weakened immune system because of current:  · AIDS or another disease that affects the immune system.  · Treatment with drugs that affect the immune system, such as prolonged use of high-dose steroids.  · Cancer treatment, such as radiation or chemotherapy.  · Cancer affecting the bone marrow or lymphatic system, such as leukemia or lymphoma.  · Is pregnant, or might be pregnant. Women should not become pregnant until at least 4 weeks after getting shingles vaccine.  Someone with a minor illness, such as a cold, may be vaccinated. Anyone with a moderate or severe acute illness should usually wait until he or she recovers before getting the vaccine. This includes anyone with a temperature of 101.3° F (38° C) or higher.  WHAT ARE THE RISKS FROM SHINGLES VACCINE?  · A vaccine, like any medicine, could possibly cause serious problems, such as severe allergic reactions. However, the risk of a vaccine causing serious harm, or death, is extremely small.  · No serious problems have been identified with shingles vaccine.  Mild Problems  · Redness, soreness, swelling, or itching at the site of the injection (about 1 person in 3).  · Headache (about 1 person in 70).  Like all vaccines, shingles vaccine is being closely monitored for unusual or severe problems.  WHAT IF   THERE IS A MODERATE OR SEVERE REACTION?  What should I look for?  Any unusual condition, such as a severe allergic reaction or a high fever. If a severe allergic reaction occurred, it would be within a few minutes to an hour after the shot. Signs of a serious allergic reaction can include difficulty breathing, weakness, hoarseness or wheezing, a fast heartbeat, hives, dizziness, paleness, or swelling of the throat.  What should I do?  · Call your caregiver, or get the person to a caregiver right away.  · Tell the caregiver what  happened, the date and time it happened, and when the vaccination was given.  · Ask the caregiver to report the reaction by filing a Vaccine Adverse Event Reporting System (VAERS) form. Or, you can file this report through the VAERS web site at www.vaers.hhs.gov or by calling 1-800-822-7967.  VAERS does not provide medical advice.  HOW CAN I LEARN MORE?  · Ask your caregiver. He or she can give you the vaccine package insert or suggest other sources of information.  · Contact the Centers for Disease Control and Prevention (CDC):  · Call 1-800-232-4636 (1-800-CDC-INFO).  · Visit the CDC website at www.cdc.gov/vaccines  CDC Shingles Vaccine VIS (11/07/07)  Document Released: 11/15/2005 Document Revised: 04/12/2011 Document Reviewed: 05/10/2012  ExitCare® Patient Information ©2014 [employer].

## 2013-06-14 NOTE — Progress Notes (Signed)
Kimberly Merritt Jun 15, 1940 [id number]   History:    73 y.o.  for GYN followup exam.She stated she's had normal Pap smears all her life. She does have a history of total abdominal hysterectomy and right salpingo-oophorectomy for endometriosis in the past.She's been followed by Dr. 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 Her primary physician Dr. Alain Marion has been doing her lab work. Patient's last colonoscopy was in January 2008 and had benign polyps removed by Dr.Maegod. Her followup colonoscopy was normal. As a result of her recent diverticulosis she also had a sigmoidoscopy in 2014. Patient's last bone density study was in 2013 and was diagnosed with osteopenia. She had tried Fosamax and Evista several years ago but had adverse reaction and stop both medications. She is currently taking vitamin E 1000 units daily. She refuses the shingles vaccine.   Past medical history,surgical history, family history and social history were all reviewed and documented in the EPIC chart.  Gynecologic History No LMP recorded. Patient is postmenopausal. Contraception: status post hysterectomy Last Pap: Several years ago. Results were: normal Last mammogram: 2015. Results were: Normal but dense  Obstetric History OB History  Gravida Para Term Preterm AB SAB TAB Ectopic Multiple Living  0                  ROS: A ROS was performed and pertinent positives and negatives are included in the history.  GENERAL: No fevers or chills. HEENT: No change in vision, no earache, sore throat or sinus congestion. NECK: No pain or stiffness. CARDIOVASCULAR: No chest pain or pressure. No palpitations. PULMONARY: No shortness of breath, cough or wheeze. GASTROINTESTINAL: No abdominal pain, nausea, vomiting or diarrhea, melena or bright red blood per rectum. GENITOURINARY: No urinary frequency, urgency, hesitancy or dysuria. MUSCULOSKELETAL: No joint or  muscle pain, no back pain, no recent trauma. DERMATOLOGIC: No rash, no itching, no lesions. ENDOCRINE: No polyuria, polydipsia, no heat or cold intolerance. No recent change in weight. HEMATOLOGICAL: No anemia or easy bruising or bleeding. NEUROLOGIC: No headache, seizures, numbness, tingling or weakness. PSYCHIATRIC: No depression, no loss of interest in normal activity or change in sleep pattern.     Exam: chaperone present  BP 138/0  Ht 5' 3.25" (1.607 m)  Wt 127 lb (57.607 kg)  BMI 22.31 kg/m2  Body mass index is 22.31 kg/(m^2).  General appearance : Well developed well nourished female. No acute distress HEENT: Neck supple, trachea midline, no carotid bruits, no thyroidmegaly Lungs: Clear to auscultation, no rhonchi or wheezes, or rib retractions  Heart: Regular rate and rhythm, no murmurs or gallops Breast:Examined in sitting and supine position were symmetrical in appearance, no palpable masses or tenderness,  no skin retraction, no nipple inversion, no nipple discharge, no skin discoloration, no axillary or supraclavicular lymphadenopathy Abdomen: no palpable masses or tenderness, no rebound or guarding Extremities: no edema or skin discoloration or tenderness  Pelvic:  Urethral caruncle             Vagina: No gross lesions or discharge, vaginal atrophy  Cervix: Absent  Uterus  absent  Adnexa  Without masses or tenderness  Anus and perineum  normal   Rectovaginal  normal sphincter tone without palpated masses or tenderness             Hemoccult PCP provides     Assessment/Plan:  73 y.o. female with vaginal atrophy asymptomatic on no HRT. Pap smear  not done today in accordance to the new guidelines. Next year when she has her mammogram she is to have a 3-D since her mammogram this year demonstrated that her breasts although normal were dense. She is due for bone density study at the end of this year. PCP will be drawn her blood work. She will continue to follow up with her  gastroenterologist as well.   Note: This dictation was prepared with  Dragon/digital dictation along withSmart phrase technology. Any transcriptional errors that result from this process are unintentional.   Terrance Mass MD, 12:20 PM 06/14/2013

## 2013-06-20 ENCOUNTER — Other Ambulatory Visit: Payer: Medicare Other

## 2013-06-20 ENCOUNTER — Ambulatory Visit: Payer: Medicare Other | Admitting: Oncology

## 2013-08-21 ENCOUNTER — Ambulatory Visit (INDEPENDENT_AMBULATORY_CARE_PROVIDER_SITE_OTHER): Payer: Medicare Other | Admitting: Internal Medicine

## 2013-08-21 ENCOUNTER — Encounter: Payer: Self-pay | Admitting: Internal Medicine

## 2013-08-21 ENCOUNTER — Other Ambulatory Visit (INDEPENDENT_AMBULATORY_CARE_PROVIDER_SITE_OTHER): Payer: Medicare Other

## 2013-08-21 VITALS — BP 130/70 | HR 85 | Temp 98.1°F | Ht 64.0 in | Wt 128.4 lb

## 2013-08-21 DIAGNOSIS — E039 Hypothyroidism, unspecified: Secondary | ICD-10-CM

## 2013-08-21 DIAGNOSIS — M545 Low back pain, unspecified: Secondary | ICD-10-CM

## 2013-08-21 DIAGNOSIS — M1990 Unspecified osteoarthritis, unspecified site: Secondary | ICD-10-CM

## 2013-08-21 DIAGNOSIS — K2100 Gastro-esophageal reflux disease with esophagitis, without bleeding: Secondary | ICD-10-CM

## 2013-08-21 DIAGNOSIS — K130 Diseases of lips: Secondary | ICD-10-CM

## 2013-08-21 DIAGNOSIS — D472 Monoclonal gammopathy: Secondary | ICD-10-CM

## 2013-08-21 LAB — BASIC METABOLIC PANEL
BUN: 19 mg/dL (ref 6–23)
CO2: 28 mEq/L (ref 19–32)
Calcium: 9.2 mg/dL (ref 8.4–10.5)
Chloride: 99 mEq/L (ref 96–112)
Creatinine, Ser: 0.8 mg/dL (ref 0.4–1.2)
GFR: 72.58 mL/min (ref >60.00)
Glucose, Bld: 104 mg/dL — ABNORMAL HIGH (ref 70–99)
Potassium: 4.7 mEq/L (ref 3.5–5.1)
Sodium: 136 mEq/L (ref 135–145)

## 2013-08-21 LAB — T4, FREE: Free T4: 1.07 ng/dL (ref 0.60–1.60)

## 2013-08-21 LAB — SEDIMENTATION RATE: Sed Rate: 28 mm/hr — ABNORMAL HIGH (ref 0–22)

## 2013-08-21 MED ORDER — HYDROCODONE-ACETAMINOPHEN 10-325 MG PO TABS: 0.5000 | ORAL_TABLET | Freq: Three times a day (TID) | ORAL | Status: DC | PRN

## 2013-08-21 NOTE — Progress Notes (Signed)
Pre visit review using our clinic review tool, if applicable. No additional management support is needed unless otherwise documented below in the visit note. 

## 2013-08-21 NOTE — Assessment & Plan Note (Signed)
ROV q 12 mo

## 2013-08-21 NOTE — Assessment & Plan Note (Signed)
Inflamatory OA, mostly hands, neck

## 2013-08-21 NOTE — Progress Notes (Signed)
   Subjective:     HPI  C/o LBP, R hip pain - seeing Dr Latanya Maudlin. C/o fatigue: feeling ok 2 hrs/d in am.  F/u flushing and itching when she took a high dose of hydrocodone. F/u allergies  The patient is here to follow up on chronic allergies depression, anxiety, headaches, osteopenia and chronic moderate fibromyalgia and OA symptoms controlled partially with medicines, diet and exercise.  BP Readings from Last 3 Encounters:  08/21/13 130/70  06/14/13 138/0  05/18/13 110/70   Wt Readings from Last 3 Encounters:  08/21/13 128 lb 6.4 oz (58.242 kg)  06/14/13 127 lb (57.607 kg)  05/18/13 122 lb (55.339 kg)    Review of Systems  Constitutional: Positive for fatigue. Negative for chills, activity change, appetite change and unexpected weight change.  HENT: Negative for congestion, mouth sores and sinus pressure.   Eyes: Negative for visual disturbance.  Respiratory: Negative for cough and chest tightness.   Gastrointestinal: Negative for nausea and abdominal pain.  Genitourinary: Negative for frequency, difficulty urinating and vaginal pain.  Musculoskeletal: Positive for arthralgias and myalgias. Negative for back pain and gait problem.  Skin: Negative for pallor and rash.  Neurological: Negative for dizziness, tremors, weakness, numbness and headaches.  Psychiatric/Behavioral: Negative for confusion and sleep disturbance. The patient is nervous/anxious.        Objective:   Physical Exam  Constitutional: She appears well-developed and well-nourished. No distress.  HENT:  Head: Normocephalic.  Right Ear: External ear normal.  Left Ear: External ear normal.  Nose: Nose normal.  Mouth/Throat: Oropharynx is clear and moist.  Eyes: Conjunctivae are normal. Pupils are equal, round, and reactive to light. Right eye exhibits no discharge. Left eye exhibits no discharge.  Neck: Normal range of motion. Neck supple. No JVD present. No tracheal deviation present. No thyromegaly present.   Cardiovascular: Normal rate, regular rhythm and normal heart sounds.   Pulmonary/Chest: No stridor. No respiratory distress. She has no wheezes.  Abdominal: Soft. Bowel sounds are normal. She exhibits no distension and no mass. There is no tenderness. There is no rebound and no guarding.  Musculoskeletal: She exhibits edema (trace) and tenderness.  Lymphadenopathy:    She has no cervical adenopathy.  Neurological: She displays normal reflexes. No cranial nerve deficit. She exhibits normal muscle tone. Coordination normal.  Skin: No rash noted. No erythema.  Spider veins on B LEs  Psychiatric: She has a normal mood and affect. Her behavior is normal. Judgment and thought content normal.  5 mm lower lip cyst on R OA changes in hands  Lab Results  Component Value Date   WBC 10.0 04/24/2013   HGB 14.1 04/24/2013   HCT 41.9 04/24/2013   PLT 371.0 04/24/2013   GLUCOSE 89 09/15/2012   CHOL 171 06/08/2011   TRIG 32.0 06/08/2011   HDL 70.90 06/08/2011   LDLDIRECT 131.0 02/05/2008   LDLCALC 94 06/08/2011   ALT 19 06/08/2011   AST 21 06/08/2011   NA 137 09/15/2012   K 4.7 09/15/2012   CL 102 09/15/2012   CREATININE 0.8 09/15/2012   BUN 14 09/15/2012   CO2 31 09/15/2012   TSH 1.06 09/15/2012   INR 1.0 04/19/2008         Assessment & Plan:

## 2013-08-21 NOTE — Assessment & Plan Note (Addendum)
Continue with current prescription therapy as reflected on the Med list.  

## 2013-08-21 NOTE — Assessment & Plan Note (Signed)
Potential benefits of a long term PPI  use as well as potential risks  and complications were explained to the patient and were aknowledged. Continue with current prescription therapy as reflected on the Med list.

## 2013-08-21 NOTE — Assessment & Plan Note (Signed)
7/15 R lower lip ?cyst Needs bx

## 2013-08-21 NOTE — Assessment & Plan Note (Signed)
Continue with current prescription therapy as reflected on the Med list.  

## 2013-08-23 ENCOUNTER — Other Ambulatory Visit: Payer: Self-pay

## 2013-08-23 MED ORDER — SYNTHROID 25 MCG PO TABS: ORAL_TABLET | ORAL | Status: DC

## 2013-09-14 ENCOUNTER — Telehealth: Payer: Self-pay

## 2013-09-14 NOTE — Telephone Encounter (Signed)
LVM for pt to call back regarding yearly AWV

## 2013-09-25 ENCOUNTER — Ambulatory Visit: Payer: Medicare Other | Admitting: Oncology

## 2013-09-25 ENCOUNTER — Other Ambulatory Visit: Payer: Self-pay | Admitting: *Deleted

## 2013-09-25 ENCOUNTER — Other Ambulatory Visit: Payer: Medicare Other

## 2013-09-25 ENCOUNTER — Other Ambulatory Visit (HOSPITAL_BASED_OUTPATIENT_CLINIC_OR_DEPARTMENT_OTHER): Payer: Medicare Other

## 2013-09-25 ENCOUNTER — Ambulatory Visit (HOSPITAL_BASED_OUTPATIENT_CLINIC_OR_DEPARTMENT_OTHER): Payer: Medicare Other | Admitting: Adult Health

## 2013-09-25 ENCOUNTER — Encounter (HOSPITAL_COMMUNITY): Payer: Self-pay | Admitting: Emergency Medicine

## 2013-09-25 ENCOUNTER — Emergency Department (HOSPITAL_COMMUNITY): Payer: Medicare Other

## 2013-09-25 ENCOUNTER — Emergency Department (HOSPITAL_COMMUNITY)
Admission: EM | Admit: 2013-09-25 | Discharge: 2013-09-25 | Disposition: A | Discharge status: Home / Self Care | Payer: Medicare Other | Attending: Emergency Medicine | Admitting: Emergency Medicine

## 2013-09-25 ENCOUNTER — Encounter: Payer: Self-pay | Admitting: Adult Health

## 2013-09-25 VITALS — BP 134/85 | HR 25 | Temp 98.0°F | Resp 16 | Ht 64.0 in | Wt 126.7 lb

## 2013-09-25 DIAGNOSIS — Z8659 Personal history of other mental and behavioral disorders: Secondary | ICD-10-CM | POA: Insufficient documentation

## 2013-09-25 DIAGNOSIS — M949 Disorder of cartilage, unspecified: Secondary | ICD-10-CM | POA: Diagnosis not present

## 2013-09-25 DIAGNOSIS — K219 Gastro-esophageal reflux disease without esophagitis: Secondary | ICD-10-CM | POA: Insufficient documentation

## 2013-09-25 DIAGNOSIS — D472 Monoclonal gammopathy: Secondary | ICD-10-CM

## 2013-09-25 DIAGNOSIS — Z79899 Other long term (current) drug therapy: Secondary | ICD-10-CM | POA: Insufficient documentation

## 2013-09-25 DIAGNOSIS — I498 Other specified cardiac arrhythmias: Secondary | ICD-10-CM | POA: Insufficient documentation

## 2013-09-25 DIAGNOSIS — M1990 Unspecified osteoarthritis, unspecified site: Secondary | ICD-10-CM | POA: Diagnosis not present

## 2013-09-25 DIAGNOSIS — IMO0002 Reserved for concepts with insufficient information to code with codable children: Secondary | ICD-10-CM | POA: Insufficient documentation

## 2013-09-25 DIAGNOSIS — I10 Essential (primary) hypertension: Secondary | ICD-10-CM

## 2013-09-25 DIAGNOSIS — M62838 Other muscle spasm: Secondary | ICD-10-CM | POA: Insufficient documentation

## 2013-09-25 DIAGNOSIS — I499 Cardiac arrhythmia, unspecified: Secondary | ICD-10-CM | POA: Insufficient documentation

## 2013-09-25 DIAGNOSIS — E039 Hypothyroidism, unspecified: Secondary | ICD-10-CM | POA: Insufficient documentation

## 2013-09-25 DIAGNOSIS — Z8505 Personal history of malignant neoplasm of liver: Secondary | ICD-10-CM | POA: Insufficient documentation

## 2013-09-25 DIAGNOSIS — M899 Disorder of bone, unspecified: Secondary | ICD-10-CM | POA: Insufficient documentation

## 2013-09-25 DIAGNOSIS — R001 Bradycardia, unspecified: Secondary | ICD-10-CM | POA: Diagnosis present

## 2013-09-25 LAB — COMPREHENSIVE METABOLIC PANEL (CC13)
ALT: 20 U/L (ref 0–55)
AST: 22 U/L (ref 5–34)
Albumin: 3.7 g/dL (ref 3.5–5.0)
Alkaline Phosphatase: 91 U/L (ref 40–150)
Anion Gap: 7 mEq/L (ref 3–11)
BUN: 25.5 mg/dL (ref 7.0–26.0)
CO2: 29 mEq/L (ref 22–29)
Calcium: 9.3 mg/dL (ref 8.4–10.4)
Chloride: 103 mEq/L (ref 98–109)
Creatinine: 1 mg/dL (ref 0.6–1.1)
Glucose: 105 mg/dl (ref 70–140)
Potassium: 4.7 mEq/L (ref 3.5–5.1)
Sodium: 139 mEq/L (ref 136–145)
Total Bilirubin: 0.29 mg/dL (ref 0.20–1.20)
Total Protein: 6.9 g/dL (ref 6.4–8.3)

## 2013-09-25 LAB — CBC WITH DIFFERENTIAL/PLATELET
BASO%: 1.3 % (ref 0.0–2.0)
Basophils Absolute: 0.1 10*3/uL (ref 0.0–0.1)
EOS%: 2.5 % (ref 0.0–7.0)
Eosinophils Absolute: 0.2 10*3/uL (ref 0.0–0.5)
HCT: 42.1 % (ref 34.8–46.6)
HGB: 13.5 g/dL (ref 11.6–15.9)
LYMPH%: 36.5 % (ref 14.0–49.7)
MCH: 29.7 pg (ref 25.1–34.0)
MCHC: 32 g/dL (ref 31.5–36.0)
MCV: 92.8 fL (ref 79.5–101.0)
MONO#: 1.2 10*3/uL — ABNORMAL HIGH (ref 0.1–0.9)
MONO%: 12.5 % (ref 0.0–14.0)
NEUT#: 4.4 10*3/uL (ref 1.5–6.5)
NEUT%: 47.2 % (ref 38.4–76.8)
Platelets: 347 10*3/uL (ref 145–400)
RBC: 4.54 10*6/uL (ref 3.70–5.45)
RDW: 12.4 % (ref 11.2–14.5)
WBC: 9.3 10*3/uL (ref 3.9–10.3)
lymph#: 3.4 10*3/uL — ABNORMAL HIGH (ref 0.9–3.3)

## 2013-09-25 LAB — TROPONIN I
Troponin I: <0.3 ng/mL (ref <0.30)
Troponin I: <0.3 ng/mL (ref <0.30)

## 2013-09-25 LAB — D-DIMER, QUANTITATIVE (NOT AT ARMC): D-Dimer, Quant: <0.27 ug/mL-FEU (ref 0.00–0.48)

## 2013-09-25 LAB — PRO B NATRIURETIC PEPTIDE: Pro B Natriuretic peptide (BNP): 1006 pg/mL — ABNORMAL HIGH (ref 0–125)

## 2013-09-25 NOTE — ED Notes (Addendum)
Pt A+Ox4, presents from [facility] after they found her pulse to be 33.  Pt reports intermittent episodes SOB "can't get air in" for "few weeks".  Pt denies CP/palpitations, dizziness/lightheadedness.  Denies n/v/d/c or other complaints.  Skin PWD.  Neuros grossly intact.  MAEI.  +csm/+pulses.  Changed to hospital gown, placed to cardiac/02 monitor.  EKG obtained and shown to Dr. Betsey Holiday.  Dr. Wyvonnia Dusky at bedside.  NAD.

## 2013-09-25 NOTE — ED Notes (Signed)
Pt ambulatory with steady gait to void in BR.  

## 2013-09-25 NOTE — Consult Note (Signed)
CARDIOLOGY CONSULT NOTE  Patient ID: Kimberly Merritt Blunt MRN: [id number] DOB/AGE: [birth year] 73 y.o.  Admit date: 09/25/2013 Primary Physician Walker Kehr, MD Primary Cardiologist None Chief Complaint  Bradycardia  HPI:  The patient has no cardiac history.  She has at a heme appt today and was reported to have bradycardia.  The patient was being seen in the cancer center and was found to have a HR of 33 while there.  She has not been hypotensive with this heart rate however.  In fact she is hypertensive.   She does have a mildly elevated proBNP of 1006.   POC troponin is normal.   She does describe some shortness of breath which has been going on for a while. However, this episodic and it's more a sense that she can't take a deep breath. She is not describing PND or orthopnea. She's not describing chest pressure, neck or arm discomfort. She has had no weight gain or edema. In the ER the EKGs and rhythm strips and there was atrial ectopy but no ventricular ectopy. On further questioning the patient is able to work in her garden yesterday and not had any complaints. She has no presyncope or syncope.   Past Medical History  Diagnosis Date  . HYPOTHYROIDISM 08/29/2006  . 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 SINUSITIS, ACUTE 01/23/2007  . ALLERGIC RHINITIS 04/25/2007  . GERD 08/29/2006  . HEPATIC CYST 08/02/2007  . OSTEOARTHRITIS 08/29/2006  . LOW BACK PAIN 08/29/2006  . Pain in Soft Tissues of Limb 08/29/2006  . OSTEOPENIA 11/19/2008  . PARESTHESIA 03/05/2009  . LIVER HEMANGIOMA 03/03/2010  . 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 MUSCLE SPASM 03/03/2010  . DEPRESSION 08/29/2006  . Diverticulosis   . Scoliosis     Past Surgical History  Procedure Laterality Date  . Lumbar laminectomy      L3-4, 4-5 and L5-S1  . Tonsillectomy    . Abdominal hysterectomy    . Appendectomy    . Carpal tunnel release      both  . Bunionectomy      both  . Back surgery  2010,2006    lumb fusion  .  Cataract extraction      both  . Shoulder arthroscopy  02/09/2011    Procedure: ARTHROSCOPY SHOULDER;  Surgeon: Hessie Dibble, MD;  Location: [facility];  Service: Orthopedics;  Laterality: Right;  right shoulder arthroscopy subacromial decompression with rotator cuff repair    Allergies  Allergen Reactions  . Acetaminophen     REACTION: high dose - tremor  . Alendronate Sodium     Stomach upset.   . Contrast Media [Iodinated Diagnostic Agents]     Syncope.   . Diflunisal     Stomach pain.    . Doxycycline     n/v  . Flexeril [Cyclobenzaprine]     Abdominal pain.    . Nabumetone     Abdominal pain; dark, tarry stool.    . Oxybutynin     Pt does not remember.    . Raloxifene     Unknown.    . Sulfa Antibiotics Nausea Only    Syncope.    . Sulfonamide Derivatives   . Symbicort [Budesonide-Formoterol Fumarate]     "Jittery-ness."   . Tramadol Hcl     Numbness.    . Diclofenac Palpitations    Dark, tarry stool.     Prior to Admission medications   Medication Sig Start Date End Date Taking? Authorizing Provider  albuterol (PROVENTIL HFA;VENTOLIN HFA) 108 (90 BASE) MCG/ACT inhaler Inhale 1-2 puffs into the lungs every 4 (four) hours as needed for wheezing or shortness of breath. 01/06/12  Yes Jearld Fenton, NP  Cholecalciferol (VITAMIN D3) 1000 UNITS CAPS Take by mouth daily.     Yes Historical Provider, MD  Fexofenadine HCl (ALLEGRA PO) Take 1 tablet by mouth every evening.    Yes Historical Provider, MD  glucosamine-chondroitin 500-400 MG tablet Take 1 tablet by mouth daily.     Yes Historical Provider, MD  HYDROcodone-acetaminophen (NORCO) 10-325 MG per tablet Take 0.5-1 tablets by mouth every 8 (eight) hours as needed for moderate pain or severe pain. 08/21/13  Yes Aleksei Plotnikov V, MD  levothyroxine (SYNTHROID, LEVOTHROID) 25 MCG tablet Take 25 mcg by mouth daily before breakfast.   Yes Historical Provider, MD  Loratadine-Pseudoephedrine (CLARITIN-D 12 HOUR  PO) Take 1 tablet by mouth daily as needed (congestion.  Do not take with fexofenadine.).    Yes Historical Provider, MD  meloxicam (MOBIC) 7.5 MG tablet  04/25/13  Yes Historical Provider, MD  methocarbamol (ROBAXIN) 500 MG tablet Take 500 mg by mouth every 6 (six) hours as needed for muscle spasms.    Yes Historical Provider, MD  NON FORMULARY Allergy injections    Yes Historical Provider, MD  OVER THE COUNTER MEDICATION Take 1 packet by mouth daily. QUALIFIBER POWDER -   Yes Historical Provider, MD  pantoprazole (PROTONIX) 40 MG tablet Take 1 tablet (40 mg total) by mouth daily. 01/06/12  Yes Jearld Fenton, NP     (Not in a hospital admission) Family History  Problem Relation Age of Onset  . Hypertension Other   . Stroke Mother     History   Social History  . Marital Status: Widowed    Spouse Name: N/A    Number of Children: N/A  . Years of Education: N/A   Occupational History  . retired    Social History Main Topics  . Smoking status: Never Smoker   . Smokeless tobacco: Never Used  . Alcohol Use: No  . Drug Use: No  . Sexual Activity: Not on file   Other Topics Concern  . Not on file   Social History Narrative  . No narrative on file     ROS:    As stated in the HPI and negative for all other systems.  Physical Exam: Blood pressure 175/72, pulse 73, resp. rate 17, SpO2 94.00%.  GENERAL:  Well appearing HEENT:  Pupils equal round and reactive, fundi not visualized, oral mucosa unremarkable NECK:  No jugular venous distention, waveform within normal limits, carotid upstroke brisk and symmetric, no bruits, no thyromegaly LYMPHATICS:  No cervical, inguinal adenopathy LUNGS:  Clear to auscultation bilaterally BACK:  No CVA tenderness CHEST:  Unremarkable HEART:  PMI not displaced or sustained,S1 and S2 within normal limits, no S3, positive S4, no clicks, no rubs, no murmurs ABD:  Flat, positive bowel sounds normal in frequency in pitch, no bruits, no rebound, no  guarding, no midline pulsatile mass, no hepatomegaly, no splenomegaly EXT:  2 plus pulses throughout, no edema, no cyanosis no clubbing, arthritic changes. SKIN:  No rashes no nodules NEURO:  Cranial nerves II through XII grossly intact, motor grossly intact throughout PSYCH:  Cognitively intact, oriented to person place and time   Labs: Lab Results  Component Value Date   BUN 25.5 09/25/2013   Lab Results  Component Value Date   CREATININE 1.0 09/25/2013   Lab  Results  Component Value Date   NA 139 09/25/2013   K 4.7 09/25/2013   CL 99 08/21/2013   CO2 29 09/25/2013   Lab Results  Component Value Date   TROPONINI <0.30 09/25/2013   Lab Results  Component Value Date   WBC 9.3 09/25/2013   HGB 13.5 09/25/2013   HCT 42.1 09/25/2013   MCV 92.8 09/25/2013   PLT 347 09/25/2013    Lab Results  Component Value Date   ALT 20 09/25/2013   AST 22 09/25/2013   ALKPHOS 91 09/25/2013   BILITOT 0.29 09/25/2013      Radiology:   CXR: Cardiac silhouette mildly enlarged but stable. Thoracic aorta  tortuous, unchanged. Hilar and mediastinal contours otherwise  unremarkable. Stable hyperinflation. Lungs clear. Bronchovascular  markings normal. Pulmonary vascularity normal. No visible pleural  effusions. No pneumothorax. Thoracic scoliosis convex right and mild  degenerative changes throughout the thoracic spine. No significant  interval change.  EKG:  NSR, rate 98, axis WNL, QTC prolonged.  PACs LA  ASSESSMENT AND PLAN:   BRADYCARDIA:  I suspect that she had a normal or slightly low heart rate but that it was undercounted in the clinic as her ectopic beats generate a weak pulse.  I doubt any bradycardia.  She clearly did not have syncope/presyncope or hypotension.  She does have ectopy which she really does not notice.  I will have her come back to the clinic for a 48 hour Holter.  HTN:  I suspect that she has some LVH (which can also explain the elevated proBNP).  I will order an  echocardiogram and have her keep a BP diary.  She will buy a machine.  For now (as she reports that this BP is new) I will not start an agent.  I did discuss diastolic HF and she already reduces salt.   Our office will call the patient to schedule follow up studies and appt.   SignedMinus Breeding 09/25/2013, 5:13 PM

## 2013-09-25 NOTE — ED Notes (Signed)
Bed: WA02 Expected date:  Expected time:  Means of arrival:  Comments: Kimberly Merritt HR 33

## 2013-09-25 NOTE — Discharge Instructions (Signed)
Bradycardia Follow up with the cardiologist for a heart monitor. Return to the ED if you develop chest pain, shortness of breath, or any other concerns. Bradycardia is a term for a heart rate (pulse) that, in adults, is slower than 60 beats per minute. A normal rate is 60 to 100 beats per minute. A heart rate below 60 beats per minute may be normal for some adults with healthy hearts. If the rate is too slow, the heart may have trouble pumping the volume of blood the body needs. If the heart rate gets too low, blood flow to the brain may be decreased and may make you feel lightheaded, dizzy, or faint. The heart has a natural pacemaker in the top of the heart called the SA node (sinoatrial or sinus node). This pacemaker sends out regular electrical signals to the muscle of the heart, telling the heart muscle when to beat (contract). The electrical signal travels from the upper parts of the heart (atria) through the AV node (atrioventricular node), to the lower chambers of the heart (ventricles). The ventricles squeeze, pumping the blood from your heart to your lungs and to the rest of your body. CAUSES   Problem with the heart's electrical system.  Problem with the heart's natural pacemaker.  Heart disease, damage, or infection.  Medications.  Problems with minerals and salts (electrolytes). SYMPTOMS   Fainting (syncope).  Fatigue and weakness.  Shortness of breath (dyspnea).  Chest pain (angina).  Drowsiness.  Confusion. DIAGNOSIS   An electrocardiogram (ECG) can help your caregiver determine the type of slow heart rate you have.  If the cause is not seen on an ECG, you may need to wear a heart monitor that records your heart rhythm for several hours or days.  Blood tests. TREATMENT   Electrolyte supplements.  Medications.  Withholding medication which is causing a slow heart rate.  Pacemaker placement. SEEK IMMEDIATE MEDICAL CARE IF:   You feel lightheaded or  faint.  You develop an irregular heart rate.  You feel chest pain or have trouble breathing. MAKE SURE YOU:   Understand these instructions.  Will watch your condition.  Will get help right away if you are not doing well or get worse. Document Released: 10/10/2001 Document Revised: 04/12/2011 Document Reviewed: 04/25/2013 [facility] Patient Information 2015 [place], [place]. This information is not intended to replace advice given to you by your health care provider. Make sure you discuss any questions you have with your health care provider.

## 2013-09-25 NOTE — ED Notes (Signed)
Cardiologist at bedside.  

## 2013-09-25 NOTE — ED Provider Notes (Signed)
CSN: [id number]     Arrival date & time 09/25/13  1505 History   First MD Initiated Contact with Patient 09/25/13 1517     Chief Complaint  Patient presents with  . Bradycardia  . Shortness of Breath     (Consider location/radiation/quality/duration/timing/severity/associated sxs/prior Treatment) HPI Comments: Patient presents from cancer center found to be bradycardic to 33. She is awake and alert. EKG shows sinus rhythm with frequent PVCs. Rate is 98 on my evaluation. She endorses feeling short of breath and can't get enough air for several weeks. She denies any chest pain, palpitations dizziness or lightheadedness. She denies any nausea, vomiting, diarrhea, fever or chills. No leg pain or leg swelling. Reports no cardiac. She was follow for her monoclonal gammopathy. She is not currently getting any chemotherapy or radiation. No focal weakness, numbness or tingling.  The history is provided by the patient.    Past Medical History  Diagnosis Date  . HYPOTHYROIDISM 08/29/2006  . 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 GERD 08/29/2006  . HEPATIC CYST 08/02/2007  . OSTEOARTHRITIS 08/29/2006  . LOW BACK PAIN 08/29/2006  . OSTEOPENIA 11/19/2008  . PARESTHESIA 03/05/2009  . LIVER HEMANGIOMA 03/03/2010  . 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 MUSCLE SPASM 03/03/2010  . DEPRESSION 08/29/2006  . Diverticulosis   . Scoliosis    Past Surgical History  Procedure Laterality Date  . Tonsillectomy    . Abdominal hysterectomy    . Appendectomy    . Carpal tunnel release      both  . Bunionectomy      both  . Back surgery  2010,2006    lumb fusion  . Cataract extraction      both  . Shoulder arthroscopy  02/09/2011    Procedure: ARTHROSCOPY SHOULDER;  Surgeon: Hessie Dibble, MD;  Location: [facility];  Service: Orthopedics;  Laterality: Right;  right shoulder arthroscopy subacromial decompression with rotator cuff repair   Family History  Problem Relation Age of Onset  .  Hypertension Other   . Stroke Mother    History  Substance Use Topics  . Smoking status: Never Smoker   . Smokeless tobacco: Never Used  . Alcohol Use: No   OB History   Grav Para Term Preterm Abortions TAB SAB Ect Mult Living   0              Review of Systems  Constitutional: Negative for fever, activity change, appetite change and fatigue.  Eyes: Positive for visual disturbance.  Respiratory: Positive for shortness of breath. Negative for cough and chest tightness.   Cardiovascular: Negative for chest pain.  Gastrointestinal: Negative for nausea, vomiting and abdominal pain.  Endocrine: Negative for polyphagia and polyuria.  Genitourinary: Negative for dysuria and hematuria.  Neurological: Negative for dizziness, light-headedness and headaches.  A complete 10 system review of systems was obtained and all systems are negative except as noted in the HPI and PMH.      Allergies  Acetaminophen; Alendronate sodium; Contrast media; Diflunisal; Doxycycline; Flexeril; Nabumetone; Oxybutynin; Raloxifene; Sulfa antibiotics; Sulfonamide derivatives; Symbicort; Tramadol hcl; and Diclofenac  Home Medications   Prior to Admission medications   Medication Sig Start Date End Date Taking? Authorizing Provider  albuterol (PROVENTIL HFA;VENTOLIN HFA) 108 (90 BASE) MCG/ACT inhaler Inhale 1-2 puffs into the lungs every 4 (four) hours as needed for wheezing or shortness of breath. 01/06/12  Yes Jearld Fenton, NP  Cholecalciferol (VITAMIN D3) 1000 UNITS CAPS Take by mouth daily.  Yes Historical Provider, MD  Fexofenadine HCl (ALLEGRA PO) Take 1 tablet by mouth every evening.    Yes Historical Provider, MD  glucosamine-chondroitin 500-400 MG tablet Take 1 tablet by mouth daily.     Yes Historical Provider, MD  HYDROcodone-acetaminophen (NORCO) 10-325 MG per tablet Take 0.5-1 tablets by mouth every 8 (eight) hours as needed for moderate pain or severe pain. 08/21/13  Yes Aleksei Plotnikov V, MD   levothyroxine (SYNTHROID, LEVOTHROID) 25 MCG tablet Take 25 mcg by mouth daily before breakfast.   Yes Historical Provider, MD  Loratadine-Pseudoephedrine (CLARITIN-D 12 HOUR PO) Take 1 tablet by mouth daily as needed (congestion.  Do not take with fexofenadine.).    Yes Historical Provider, MD  meloxicam (MOBIC) 7.5 MG tablet  04/25/13  Yes Historical Provider, MD  methocarbamol (ROBAXIN) 500 MG tablet Take 500 mg by mouth every 6 (six) hours as needed for muscle spasms.    Yes Historical Provider, MD  NON FORMULARY Allergy injections    Yes Historical Provider, MD  OVER THE COUNTER MEDICATION Take 1 packet by mouth daily. QUALIFIBER POWDER -   Yes Historical Provider, MD  pantoprazole (PROTONIX) 40 MG tablet Take 1 tablet (40 mg total) by mouth daily. 01/06/12  Yes Jearld Fenton, NP   BP 153/94  Pulse 74  Resp 18  SpO2 97% Physical Exam  Nursing note and vitals reviewed. Constitutional: She is oriented to person, place, and time. She appears well-developed and well-nourished. No distress.  HENT:  Head: Normocephalic and atraumatic.  Mouth/Throat: Oropharynx is clear and moist. No oropharyngeal exudate.  Eyes: Conjunctivae and EOM are normal. Pupils are equal, round, and reactive to light.  Neck: Normal range of motion. Neck supple.  No meningismus.  Cardiovascular: Normal rate, normal heart sounds and intact distal pulses.   No murmur heard. Irregular rhythm  Pulmonary/Chest: Effort normal and breath sounds normal. No respiratory distress.  Abdominal: Soft. There is no tenderness. There is no rebound and no guarding.  Musculoskeletal: Normal range of motion. She exhibits no edema and no tenderness.  Neurological: She is alert and oriented to person, place, and time. No cranial nerve deficit. She exhibits normal muscle tone. Coordination normal.  No ataxia on finger to nose bilaterally. No pronator drift. 5/5 strength throughout. CN 2-12 intact. Negative Romberg. Equal grip strength.  Sensation intact. Gait is normal.   Skin: Skin is warm.  Psychiatric: She has a normal mood and affect. Her behavior is normal.    ED Course  Procedures (including critical care time) Labs Review Labs Reviewed  PRO B NATRIURETIC PEPTIDE - Abnormal; Notable for the following:    Pro B Natriuretic peptide (BNP) 1006.0 (*)    All other components within normal limits  TROPONIN I  D-DIMER, QUANTITATIVE  TROPONIN I    Imaging Review Dg Chest 2 View  09/25/2013   CLINICAL DATA:  Shortness of breath.  Bradycardia.  EXAM: CHEST  2 VIEW  COMPARISON:  Two-view chest x-ray 08/10/2011, 05/26/2010.  FINDINGS: Cardiac silhouette mildly enlarged but stable. Thoracic aorta tortuous, unchanged. Hilar and mediastinal contours otherwise unremarkable. Stable hyperinflation. Lungs clear. Bronchovascular markings normal. Pulmonary vascularity normal. No visible pleural effusions. No pneumothorax. Thoracic scoliosis convex right and mild degenerative changes throughout the thoracic spine. No significant interval change.  IMPRESSION: Hyperinflation consistent with COPD and/or asthma. No acute cardiopulmonary disease. Stable examination.   Electronically Signed   By: Evangeline Dakin M.D.   On: 09/25/2013 16:16     EKG Interpretation  Date/Time:  Tuesday September 25 2013 15:06:25 EDT Ventricular Rate:  98 PR Interval:  138 QRS Duration: 90 QT Interval:  440 QTC Calculation: 562 R Axis:   85 Text Interpretation:  Sinus rhythm Paired ventricular premature complexes  Biatrial enlargement Borderline right axis deviation Repol abnrm suggests  ischemia, diffuse leads Nonspecific ST and T wave abnormality Confirmed by  [place] [id number]) on 09/25/2013 3:40:54 PM      MDM   Final diagnoses:  Irregular heart beat   From cancer center, found to have irregular rhythm with frequent PVCs.  Admits to several week history of SOB. Questionable accuracy of bradycardic reading.  EKG shows sinus  tachycardia with frequent PVCs and trigeminy. No appreciable bradycardia in the ED. D-dimer negative. Troponin negative. VQ scan canceled. Patient with no hypoxia  Troponin negative x2. Patient is asymptomatic. Telemetry in the ER shows multiple PVCs. No symptomatic bradycardia. No hypotension, dizziness or syncope.  Patient has been seen by Dr. Percival Spanish who feels that she can be discharged for followup Holter monitoring as well as echocardiogram. Return precautions discussed.    Ezequiel [place], MD 09/26/13 [phone]

## 2013-09-26 ENCOUNTER — Other Ambulatory Visit: Payer: Self-pay | Admitting: *Deleted

## 2013-09-26 DIAGNOSIS — R001 Bradycardia, unspecified: Secondary | ICD-10-CM

## 2013-09-26 NOTE — Progress Notes (Signed)
The patient was very briefly evaluated.  Received word from nurse that patient's heart rate was 33 evaluated by RN through radial pulse.  I did evaluate the patient briefly.  She was alert, oriented and did complain of a 2 week history of dyspnea, fatigue, and occasional chest pain that comes and goes.  I reviewed the patients medications with her.  I did examine her and auscultated an regularly irregular bradycardic HR at 40.  We sent her to the ER for further evaluation.    GENERAL: Patient is an older appearing female in no acute distress LUNGS:  Clear to auscultation bilaterally.  No wheezes or rhonchi. HEART:  Bradycardic, regularrly irregular, auscultated x 1 minute ABDOMEN:  Soft, nontender.  Positive, normoactive bowel sounds. No organomegaly palpated. EXTREMITIES:  No peripheral edema.   SKIN:  Clear with no obvious rashes or skin changes. No nail dyscrasia. NEURO:  Nonfocal. Well oriented.  Appropriate affect.  She will need to be rescheduled to f/u with [place] at a later time.  Minette Headland, [place] [phone]

## 2013-09-27 ENCOUNTER — Encounter (INDEPENDENT_AMBULATORY_CARE_PROVIDER_SITE_OTHER): Payer: Medicare Other

## 2013-09-27 ENCOUNTER — Other Ambulatory Visit (HOSPITAL_COMMUNITY): Payer: Self-pay | Admitting: Cardiology

## 2013-09-27 ENCOUNTER — Encounter: Payer: Self-pay | Admitting: *Deleted

## 2013-09-27 ENCOUNTER — Ambulatory Visit (HOSPITAL_COMMUNITY): Payer: Medicare Other | Attending: Cardiology

## 2013-09-27 DIAGNOSIS — I498 Other specified cardiac arrhythmias: Secondary | ICD-10-CM

## 2013-09-27 DIAGNOSIS — I059 Rheumatic mitral valve disease, unspecified: Secondary | ICD-10-CM | POA: Insufficient documentation

## 2013-09-27 DIAGNOSIS — I495 Sick sinus syndrome: Secondary | ICD-10-CM | POA: Diagnosis present

## 2013-09-27 DIAGNOSIS — R001 Bradycardia, unspecified: Secondary | ICD-10-CM

## 2013-09-27 DIAGNOSIS — R9389 Abnormal findings on diagnostic imaging of other specified body structures: Secondary | ICD-10-CM | POA: Insufficient documentation

## 2013-09-27 DIAGNOSIS — I4949 Other premature depolarization: Secondary | ICD-10-CM

## 2013-09-27 LAB — SPEP & IFE WITH QIG
Albumin ELP: 60.1 % (ref 55.8–66.1)
Alpha-1-Globulin: 4.3 % (ref 2.9–4.9)
Alpha-2-Globulin: 10.8 % (ref 7.1–11.8)
Beta 2: 1.9 % — ABNORMAL LOW (ref 3.2–6.5)
Beta Globulin: 18.1 % — ABNORMAL HIGH (ref 4.7–7.2)
Gamma Globulin: 4.8 % — ABNORMAL LOW (ref 11.1–18.8)
IgA: 18 mg/dL — ABNORMAL LOW (ref 69–380)
IgG (Immunoglobin G), Serum: 321 mg/dL — ABNORMAL LOW (ref 690–1700)
IgM, Serum: 992 mg/dL — ABNORMAL HIGH (ref 52–322)
M-Spike, %: 0.72 g/dL
Total Protein, Serum Electrophoresis: 6.6 g/dL (ref 6.0–8.3)

## 2013-09-27 LAB — KAPPA/LAMBDA LIGHT CHAINS
Kappa free light chain: 0.73 mg/dL (ref 0.33–1.94)
Kappa:Lambda Ratio: 0.69 (ref 0.26–1.65)
Lambda Free Lght Chn: 1.06 mg/dL (ref 0.57–2.63)

## 2013-09-27 LAB — BETA 2 MICROGLOBULIN, SERUM: Beta-2 Microglobulin: 2.36 mg/L (ref <=2.51)

## 2013-09-27 NOTE — Progress Notes (Signed)
Patient ID: Kimberly Merritt, female   DOB: Jul 18, 1940, 74 y.o.   MRN: [id number] [facility] 48 hour holter monitor applied to patient.

## 2013-09-27 NOTE — Progress Notes (Signed)
2D Echo completed. 09/27/2013

## 2013-09-28 ENCOUNTER — Telehealth (HOSPITAL_COMMUNITY): Payer: Self-pay | Admitting: *Deleted

## 2013-09-28 ENCOUNTER — Telehealth: Payer: Self-pay | Admitting: Cardiology

## 2013-09-28 ENCOUNTER — Telehealth: Payer: Self-pay | Admitting: Internal Medicine

## 2013-09-28 NOTE — Telephone Encounter (Signed)
I talked to this pt. And assured her it would not take 7 to 10 days to get the results back to her

## 2013-09-28 NOTE — Telephone Encounter (Signed)
Schedule hospital f/u.

## 2013-09-28 NOTE — Telephone Encounter (Signed)
i talked to this pt. And informed  Her that we would call her with the results

## 2013-09-28 NOTE — Telephone Encounter (Signed)
Pt called stating she was in the ED on 09/25/13.  She states she is under the care of Cardiology and wants to know if she needs to schedule an appointment with Dr. Alain Marion??  She declined scheduling an appointment at this time until she hears back.

## 2013-09-28 NOTE — Telephone Encounter (Signed)
Pt is concerned because she was told that she would have to wait 7-10 days to hear about her echo results. She is having symptoms and does not feel safe driving.

## 2013-09-28 NOTE — Telephone Encounter (Signed)
Pt is sch 10/03/13

## 2013-09-28 NOTE — Telephone Encounter (Signed)
Pt is very concerned about her BP,today it was 147/75 and pulse was 70. She wants to know if there is a certain time each day she should take her pressure?She had a monitor put on yesterday and had  Echo test at [facility]. She wants to know when she will get her results and does she need a follow up appointment?

## 2013-10-02 ENCOUNTER — Ambulatory Visit (INDEPENDENT_AMBULATORY_CARE_PROVIDER_SITE_OTHER): Payer: Medicare Other | Admitting: Cardiology

## 2013-10-02 ENCOUNTER — Telehealth: Payer: Self-pay | Admitting: *Deleted

## 2013-10-02 ENCOUNTER — Telehealth: Payer: Self-pay | Admitting: Internal Medicine

## 2013-10-02 ENCOUNTER — Encounter: Payer: Self-pay | Admitting: Cardiology

## 2013-10-02 ENCOUNTER — Other Ambulatory Visit: Payer: Self-pay | Admitting: *Deleted

## 2013-10-02 VITALS — BP 134/80 | HR 86 | Ht 64.0 in | Wt 126.0 lb

## 2013-10-02 DIAGNOSIS — Z79899 Other long term (current) drug therapy: Secondary | ICD-10-CM

## 2013-10-02 DIAGNOSIS — R5381 Other malaise: Secondary | ICD-10-CM

## 2013-10-02 DIAGNOSIS — I2589 Other forms of chronic ischemic heart disease: Secondary | ICD-10-CM

## 2013-10-02 DIAGNOSIS — R5383 Other fatigue: Secondary | ICD-10-CM

## 2013-10-02 DIAGNOSIS — I255 Ischemic cardiomyopathy: Secondary | ICD-10-CM

## 2013-10-02 DIAGNOSIS — Z01810 Encounter for preprocedural cardiovascular examination: Secondary | ICD-10-CM

## 2013-10-02 DIAGNOSIS — D689 Coagulation defect, unspecified: Secondary | ICD-10-CM

## 2013-10-02 LAB — COMPLETE METABOLIC PANEL WITH GFR
ALT: 20 U/L (ref 0–35)
AST: 22 U/L (ref 0–37)
Albumin: 4.2 g/dL (ref 3.5–5.2)
Alkaline Phosphatase: 84 U/L (ref 39–117)
BUN: 18 mg/dL (ref 6–23)
CO2: 27 mEq/L (ref 19–32)
Calcium: 9.4 mg/dL (ref 8.4–10.5)
Chloride: 100 mEq/L (ref 96–112)
Creat: 0.84 mg/dL (ref 0.50–1.10)
GFR, Est African American: 80 mL/min
GFR, Est Non African American: 69 mL/min
Glucose, Bld: 88 mg/dL (ref 70–99)
Potassium: 5.3 mEq/L (ref 3.5–5.3)
Sodium: 136 mEq/L (ref 135–145)
Total Bilirubin: 0.4 mg/dL (ref 0.2–1.2)
Total Protein: 6.9 g/dL (ref 6.0–8.3)

## 2013-10-02 LAB — CBC
HCT: 42.4 % (ref 36.0–46.0)
Hemoglobin: 14.4 g/dL (ref 12.0–15.0)
MCH: 30.1 pg (ref 26.0–34.0)
MCHC: 34 g/dL (ref 30.0–36.0)
MCV: 88.7 fL (ref 78.0–100.0)
Platelets: 402 10*3/uL — ABNORMAL HIGH (ref 150–400)
RBC: 4.78 MIL/uL (ref 3.87–5.11)
RDW: 12.6 % (ref 11.5–15.5)
WBC: 10.6 10*3/uL — ABNORMAL HIGH (ref 4.0–10.5)

## 2013-10-02 MED ORDER — LISINOPRIL 2.5 MG PO TABS: 2.5000 mg | ORAL_TABLET | Freq: Every day | ORAL | Status: DC

## 2013-10-02 NOTE — Telephone Encounter (Signed)
Received appt instructions from Dr. Jana Hakim.  Called pt and confirmed 10/18/13 lab appt and 10/24/13 med onc appt.  Placed note in EPIC for a packet to be given to pt at time of check in. Made chart.

## 2013-10-02 NOTE — Progress Notes (Signed)
HPI The patient presents for a week and cardiomyopathy. In the emergency room the other day. She was referred here because of a low heart rate. However, her heart rate was never documented to be in the 30s as had been palpated earlier in a doctor's office. Rather she had ectopy which we thought to be under tablet. She is not feeling any overt symptoms. She would have some vague episodes of feeling like she couldn't take a deep breath. She was not however having any presyncope or syncope. She was in fact hypertensive. She was denying any chest pressure, neck or arm discomfort. Troponins were negative her BNP was slightly elevated. I did send her for an echocardiogram which demonstrates global hypokinesis with an EF of 30%. A Holter monitor result is pending. She says that she does have a little bit of  Staggering in her gait but this is not new. She's had some fleeting sharp chest discomfort.  However, she denies any chest pressure, neck or arm discomfort. She's not describing PND or orthopnea. She's had no change in her weight.  Allergies  Allergen Reactions  . Acetaminophen     REACTION: high dose - tremor  . Alendronate Sodium     Stomach upset.   . Contrast Media [Iodinated Diagnostic Agents]     Syncope.   . Diflunisal     Stomach pain.    . Doxycycline     n/v  . Flexeril [Cyclobenzaprine]     Abdominal pain.    . Nabumetone     Abdominal pain; dark, tarry stool.    . Oxybutynin     Pt does not remember.    . Raloxifene     Unknown.    . Sulfa Antibiotics Nausea Only    Syncope.    . Sulfonamide Derivatives   . Symbicort [Budesonide-Formoterol Fumarate]     "Jittery-ness."   . Tramadol Hcl     Numbness.    . Diclofenac Palpitations    Dark, tarry stool.      Current Outpatient Prescriptions  Medication Sig Dispense Refill  . albuterol (PROVENTIL HFA;VENTOLIN HFA) 108 (90 BASE) MCG/ACT inhaler Inhale 1-2 puffs into the lungs every 4 (four) hours as needed for wheezing or  shortness of breath.      . Cholecalciferol (VITAMIN D3) 1000 UNITS CAPS Take by mouth daily.        Marland Kitchen Fexofenadine HCl (ALLEGRA PO) Take 1 tablet by mouth every evening.       Marland Kitchen glucosamine-chondroitin 500-400 MG tablet Take 1 tablet by mouth daily.        Marland Kitchen HYDROcodone-acetaminophen (NORCO) 10-325 MG per tablet Take 0.5-1 tablets by mouth every 8 (eight) hours as needed for moderate pain or severe pain.  100 tablet  0  . levothyroxine (SYNTHROID, LEVOTHROID) 25 MCG tablet Take 25 mcg by mouth daily before breakfast.      . Loratadine-Pseudoephedrine (CLARITIN-D 12 HOUR PO) Take 1 tablet by mouth daily as needed (congestion.  Do not take with fexofenadine.).       Marland Kitchen meloxicam (MOBIC) 7.5 MG tablet       . methocarbamol (ROBAXIN) 500 MG tablet Take 500 mg by mouth every 6 (six) hours as needed for muscle spasms.       . NON FORMULARY Allergy injections       . OVER THE COUNTER MEDICATION Take 1 packet by mouth daily. QUALIFIBER POWDER -      . pantoprazole (PROTONIX) 40 MG tablet Take 1  tablet (40 mg total) by mouth daily.  30 tablet  3   No current facility-administered medications for this visit.    Past Medical History  Diagnosis Date  . HYPOTHYROIDISM 08/29/2006  . 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 GERD 08/29/2006  . HEPATIC CYST 08/02/2007  . OSTEOARTHRITIS 08/29/2006  . LOW BACK PAIN 08/29/2006  . OSTEOPENIA 11/19/2008  . PARESTHESIA 03/05/2009  . LIVER HEMANGIOMA 03/03/2010  . 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 MUSCLE SPASM 03/03/2010  . DEPRESSION 08/29/2006  . Diverticulosis   . Scoliosis     Past Surgical History  Procedure Laterality Date  . Tonsillectomy    . Abdominal hysterectomy    . Appendectomy    . Carpal tunnel release      both  . Bunionectomy      both  . Back surgery  2010,2006    lumb fusion  . Cataract extraction      both  . Shoulder arthroscopy  02/09/2011    Procedure: ARTHROSCOPY SHOULDER;  Surgeon: Hessie Dibble, MD;  Location: [facility];  Service: Orthopedics;  Laterality: Right;  right shoulder arthroscopy subacromial decompression with rotator cuff repair    ROS:  As stated in the HPI and negative for all other systems.   PHYSICAL EXAM BP 134/80  Pulse 86  Ht 5\' 4"  (1.626 m)  Wt 126 lb (57.153 kg)  BMI 21.62 kg/m2 GENERAL:  Well appearing HEENT:  Pupils equal round and reactive, fundi not visualized, oral mucosa unremarkable NECK:  No jugular venous distention, waveform within normal limits, carotid upstroke brisk and symmetric, no bruits, no thyromegaly LYMPHATICS:  No cervical, inguinal adenopathy LUNGS:  Clear to auscultation bilaterally BACK:  No CVA tenderness CHEST:  Unremarkable HEART:  PMI not displaced or sustained,S1 and S2 within normal limits, no S3, positive S4, no clicks, no rubs, no murmurs ABD:  Flat, positive bowel sounds normal in frequency in pitch, no bruits, no rebound, no guarding, no midline pulsatile mass, no hepatomegaly, no splenomegaly EXT:  2 plus pulses throughout, no edema, no cyanosis no clubbing SKIN:  No rashes no nodules NEURO:  Cranial nerves II through XII grossly intact, motor grossly intact throughout PSYCH:  Cognitively intact, oriented to person place and time   ASSESSMENT AND PLAN  CARDIOMYPATHY:  She will need right and left heart catheterization to further evaluate this.The patient understands that risks included but are not limited to stroke (1 in 1000), death (1 in 51), kidney failure [usually temporary] (1 in 500), bleeding (1 in 200), allergic reaction [possibly serious] (1 in 200).  The patient understands and agrees to proceed.   I will arrange this for this week. I will start a low dose of ACE inhibitor. We can further titrate meds if her blood pressure allows at the time of catheterization.  HTN:  Her blood pressure has been fluctuating but I suspect that it's more elevated than low as it was in the emergency room. We'll be managed in the  context of treating her cardiomyopathy.  PVCs:  She was having frequent ventricular ectopy. Results the monitor are pending.  DYE ALLERGY:   She will be treated for contrast dye allergy.

## 2013-10-02 NOTE — Telephone Encounter (Signed)
Noted. Thx.

## 2013-10-02 NOTE — Patient Instructions (Addendum)
We are scheduling a right and left heart cath  Have your labs done today  Lisinopril 2.5 daily

## 2013-10-02 NOTE — Addendum Note (Signed)
Addended by: Dolan Amen on: 10/02/2013 04:16 PM   Modules accepted: Orders

## 2013-10-02 NOTE — Telephone Encounter (Signed)
Pt called to cancel the appt for the hospital follow on 10/03/13. Pt stated that she has cardiac catheterization on 10/05/13 and she feels that she has too many appt going on. Pt just want Dr. Camila Li to know that.

## 2013-10-03 ENCOUNTER — Other Ambulatory Visit: Payer: Self-pay | Admitting: *Deleted

## 2013-10-03 ENCOUNTER — Ambulatory Visit: Payer: Medicare Other | Admitting: Internal Medicine

## 2013-10-03 LAB — URINALYSIS, MICROSCOPIC ONLY
Bacteria, UA: NONE SEEN
Casts: NONE SEEN
Crystals: NONE SEEN
Squamous Epithelial / LPF: NONE SEEN

## 2013-10-03 LAB — PROTIME-INR
INR: 1.03 (ref <1.50)
Prothrombin Time: 13.5 seconds (ref 11.6–15.2)

## 2013-10-03 LAB — APTT: aPTT: 32 seconds (ref 24–37)

## 2013-10-04 ENCOUNTER — Encounter (HOSPITAL_COMMUNITY)
Admission: RE | Disposition: A | Discharge status: Home / Self Care | Payer: Self-pay | Source: Ambulatory Visit | Attending: Cardiology

## 2013-10-04 ENCOUNTER — Ambulatory Visit (HOSPITAL_COMMUNITY)
Admission: RE | Admit: 2013-10-04 | Discharge: 2013-10-04 | Disposition: A | Discharge status: Home / Self Care | Payer: Medicare Other | Source: Ambulatory Visit | Attending: Cardiology | Admitting: Cardiology

## 2013-10-04 DIAGNOSIS — I2510 Atherosclerotic heart disease of native coronary artery without angina pectoris: Secondary | ICD-10-CM

## 2013-10-04 DIAGNOSIS — M1990 Unspecified osteoarthritis, unspecified site: Secondary | ICD-10-CM | POA: Insufficient documentation

## 2013-10-04 DIAGNOSIS — K219 Gastro-esophageal reflux disease without esophagitis: Secondary | ICD-10-CM | POA: Insufficient documentation

## 2013-10-04 DIAGNOSIS — I10 Essential (primary) hypertension: Secondary | ICD-10-CM | POA: Insufficient documentation

## 2013-10-04 DIAGNOSIS — Z791 Long term (current) use of non-steroidal anti-inflammatories (NSAID): Secondary | ICD-10-CM | POA: Diagnosis not present

## 2013-10-04 DIAGNOSIS — I420 Dilated cardiomyopathy: Secondary | ICD-10-CM

## 2013-10-04 DIAGNOSIS — F3289 Other specified depressive episodes: Secondary | ICD-10-CM | POA: Diagnosis not present

## 2013-10-04 DIAGNOSIS — I428 Other cardiomyopathies: Secondary | ICD-10-CM | POA: Diagnosis not present

## 2013-10-04 DIAGNOSIS — R0789 Other chest pain: Secondary | ICD-10-CM | POA: Diagnosis present

## 2013-10-04 DIAGNOSIS — F329 Major depressive disorder, single episode, unspecified: Secondary | ICD-10-CM | POA: Diagnosis not present

## 2013-10-04 DIAGNOSIS — R001 Bradycardia, unspecified: Secondary | ICD-10-CM

## 2013-10-04 DIAGNOSIS — E039 Hypothyroidism, unspecified: Secondary | ICD-10-CM | POA: Insufficient documentation

## 2013-10-04 DIAGNOSIS — Z01810 Encounter for preprocedural cardiovascular examination: Secondary | ICD-10-CM

## 2013-10-04 HISTORY — PX: LEFT AND RIGHT HEART CATHETERIZATION WITH CORONARY ANGIOGRAM: SHX5449

## 2013-10-04 LAB — POCT I-STAT 3, ART BLOOD GAS (G3+)
Acid-Base Excess: 1 mmol/L (ref 0.0–2.0)
Bicarbonate: 27.3 mEq/L — ABNORMAL HIGH (ref 20.0–24.0)
Bicarbonate: 24.9 mEq/L — ABNORMAL HIGH (ref 20.0–24.0)
O2 Saturation: 95 %
O2 Saturation: 92 %
TCO2: 29 mmol/L (ref 0–100)
TCO2: 26 mmol/L (ref 0–100)
pCO2 arterial: 47.9 mmHg — ABNORMAL HIGH (ref 35.0–45.0)
pCO2 arterial: 40.6 mmHg (ref 35.0–45.0)
pH, Arterial: 7.364 (ref 7.350–7.450)
pH, Arterial: 7.396 (ref 7.350–7.450)
pO2, Arterial: 81 mmHg (ref 80.0–100.0)
pO2, Arterial: 65 mmHg — ABNORMAL LOW (ref 80.0–100.0)

## 2013-10-04 LAB — POCT I-STAT 3, VENOUS BLOOD GAS (G3P V)
Acid-Base Excess: 1 mmol/L (ref 0.0–2.0)
Acid-base deficit: 2 mmol/L (ref 0.0–2.0)
Bicarbonate: 25.2 mEq/L — ABNORMAL HIGH (ref 20.0–24.0)
Bicarbonate: 27 mEq/L — ABNORMAL HIGH (ref 20.0–24.0)
O2 Saturation: 70 %
O2 Saturation: 68 %
TCO2: 27 mmol/L (ref 0–100)
TCO2: 28 mmol/L (ref 0–100)
pCO2, Ven: 52.8 mmHg — ABNORMAL HIGH (ref 45.0–50.0)
pCO2, Ven: 49 mmHg (ref 45.0–50.0)
pH, Ven: 7.287 (ref 7.250–7.300)
pH, Ven: 7.349 — ABNORMAL HIGH (ref 7.250–7.300)
pO2, Ven: 41 mmHg (ref 30.0–45.0)
pO2, Ven: 38 mmHg (ref 30.0–45.0)

## 2013-10-04 LAB — POCT ACTIVATED CLOTTING TIME: Activated Clotting Time: 174 seconds

## 2013-10-04 SURGERY — LEFT AND RIGHT HEART CATHETERIZATION WITH CORONARY ANGIOGRAM
Anesthesia: LOCAL

## 2013-10-04 MED ORDER — METHYLPREDNISOLONE SODIUM SUCC 125 MG IJ SOLR
125.0000 mg | INTRAMUSCULAR | Status: AC
  Administered 2013-10-04: 125 mg via INTRAVENOUS

## 2013-10-04 MED ORDER — SODIUM CHLORIDE 0.9 % IV SOLN
INTRAVENOUS | Status: DC
  Administered 2013-10-04: 08:00:00 via INTRAVENOUS

## 2013-10-04 MED ORDER — METHYLPREDNISOLONE SODIUM SUCC 125 MG IJ SOLR
INTRAMUSCULAR | Status: AC
  Filled 2013-10-04: qty 2

## 2013-10-04 MED ORDER — HEPARIN SODIUM (PORCINE) 1000 UNIT/ML IJ SOLN
INTRAMUSCULAR | Status: AC
  Filled 2013-10-04: qty 1

## 2013-10-04 MED ORDER — FAMOTIDINE IN NACL 20-0.9 MG/50ML-% IV SOLN
INTRAVENOUS | Status: AC
  Filled 2013-10-04: qty 50

## 2013-10-04 MED ORDER — VERAPAMIL HCL 2.5 MG/ML IV SOLN
INTRAVENOUS | Status: AC
  Filled 2013-10-04: qty 2

## 2013-10-04 MED ORDER — NITROGLYCERIN 0.4 MG SL SUBL: 0.4000 mg | SUBLINGUAL_TABLET | SUBLINGUAL | Status: DC | PRN

## 2013-10-04 MED ORDER — NITROGLYCERIN 1 MG/10 ML FOR IR/CATH LAB
INTRA_ARTERIAL | Status: AC
  Filled 2013-10-04: qty 10

## 2013-10-04 MED ORDER — HEPARIN (PORCINE) IN NACL 2-0.9 UNIT/ML-% IJ SOLN
INTRAMUSCULAR | Status: AC
  Filled 2013-10-04: qty 1000

## 2013-10-04 MED ORDER — MIDAZOLAM HCL 2 MG/2ML IJ SOLN
INTRAMUSCULAR | Status: AC
  Filled 2013-10-04: qty 2

## 2013-10-04 MED ORDER — ASPIRIN 81 MG PO CHEW
81.0000 mg | CHEWABLE_TABLET | ORAL | Status: AC
  Administered 2013-10-04: 81 mg via ORAL

## 2013-10-04 MED ORDER — FAMOTIDINE IN NACL 20-0.9 MG/50ML-% IV SOLN
20.0000 mg | INTRAVENOUS | Status: AC
  Administered 2013-10-04: 20 mg via INTRAVENOUS

## 2013-10-04 MED ORDER — DIPHENHYDRAMINE HCL 50 MG/ML IJ SOLN
INTRAMUSCULAR | Status: AC
  Filled 2013-10-04: qty 1

## 2013-10-04 MED ORDER — NITROGLYCERIN 0.4 MG SL SUBL
SUBLINGUAL_TABLET | SUBLINGUAL | Status: AC
  Administered 2013-10-04: 0.4 mg
  Filled 2013-10-04: qty 1

## 2013-10-04 MED ORDER — ASPIRIN 81 MG PO CHEW
CHEWABLE_TABLET | ORAL | Status: AC
  Filled 2013-10-04: qty 1

## 2013-10-04 MED ORDER — FENTANYL CITRATE 0.05 MG/ML IJ SOLN
INTRAMUSCULAR | Status: AC
  Filled 2013-10-04: qty 2

## 2013-10-04 MED ORDER — DIPHENHYDRAMINE HCL 50 MG/ML IJ SOLN
25.0000 mg | INTRAMUSCULAR | Status: AC
  Administered 2013-10-04: 25 mg via INTRAVENOUS

## 2013-10-04 MED ORDER — LIDOCAINE HCL (PF) 1 % IJ SOLN
INTRAMUSCULAR | Status: AC
  Filled 2013-10-04: qty 30

## 2013-10-04 MED ORDER — SODIUM CHLORIDE 0.9 % IV SOLN: 1.0000 mL/kg/h | INTRAVENOUS | Status: DC

## 2013-10-04 MED ORDER — ONDANSETRON HCL 4 MG/2ML IJ SOLN: 4.0000 mg | Freq: Four times a day (QID) | INTRAMUSCULAR | Status: DC | PRN

## 2013-10-04 MED ORDER — SODIUM CHLORIDE 0.9 % IJ SOLN: 3.0000 mL | INTRAMUSCULAR | Status: DC | PRN

## 2013-10-04 NOTE — CV Procedure (Signed)
CARDIAC CATHETERIZATION  REPORT  NAME:  Kimberly Merritt   MRN: [id number] DOB:  February 25, 1940   ADMIT DATE: 10/04/2013 Procedure Date: 10/04/2013  INTERVENTIONAL CARDIOLOGIST: Leonie Man, M.D., MS PRIMARY CARE PROVIDER: Walker Kehr, MD PRIMARY CARDIOLOGIST: Minus Breeding MD  PATIENT:  Kimberly Merritt is a 73 y.o. female with a history of hypothyroidism and varicose veins as well as chronic back pain who was evaluated by Dr. Percival Spanish on September 1 for post emergency room followup. He was initially evaluated for bradycardia, but no bradycardia was noted on the monitor. She has been noticing progressively worsening exertional dyspnea fatigue. As part of her evaluation she had an echocardiogram which revealed an ejection fraction of 30%. Based on her having some atypical chest pain episodes are described as fleeting sharp discomfort, Dr. Percival Spanish felt it prudent to investigate for any potential ischemic etiology for her use ejection fraction. He is now referred for invasive evaluation with right and left heart catheterization.   PRE-OPERATIVE DIAGNOSIS:    New diagnosis of Cardiomyopathy  Atypical chest pain  PROCEDURES PERFORMED:    Right & Left Heart Catheterization with Native Coronary Angiography  via Right Radial Artery & Right Common Femoral Vein Access.  Left Ventriculography  PROCEDURE: The patient was brought to the [facility] in the fasting state and prepped and draped in the usual sterile fashion for Right femoral arterial / venous or radial access. A modified Allen's test was performed on the right wrist demonstrating excellent collateral flow. An existing 18-gauge IV was in the right antecubital vein. Sterile technique was used including antiseptics, cap, gloves, gown, hand hygiene, mask and sheet. Skin prep: Chlorhexidine.   Consent: Risks of procedure as well as the alternatives and risks of each were explained to the (patient/caregiver).  Consent for procedure obtained.   Time Out: Verified patient identification, verified procedure, site/side was marked, verified correct patient position, special equipment/implants available, medications/allergies/relevent history reviewed, required imaging and test results available. Performed.  Access:  Right  Radial Artery: 6 Fr sheath -- Seldinger technique using Angiocath Micropuncture Kit 10 mL radial cocktail IA; 4500 Units IV Heparin  Right Brachial/Antecubital Vein: The existing 18-gauge IV was exchanged over a wire for a 5Fr glide sheath  Unfortunately, the standard 5 [place] Swan catheter would not advance beyond the axillary vein despite the use of a 0.25 wire. Therefore the decision was made to convert to femoral venous access Right Common Femoral Vein: 7 Fr Sheath - Seldinger Technique.  Right Heart Catheterization: 7 Fr Gordy Councilman catheter advanced under fluoroscopy with balloon inflated to the RA, RV, then PCWP-PA for hemodynamic measurement.  Simultaneous FA & PA blood gases checked for SaO2% to calculate FICK CO/CI  Thermodilution Injections performed to calculate CO/CI  Simultaneous PCWP/LV & RV/LV pressures monitored with Angled Pigtail in LV.  Catheter removed completely out of the body with balloon deflated.  Left Heart Catheterization: 5 Fr Catheters advanced or exchanged over a Long Exchange Safety J-wire;TIG 4.0 catheter advanced first.   Left Coronary Artery Cineangiography: TIG 4.0 Catheter  Right Coronary Artery Cineangiography: TIG 4.0 Catheter  LV Hemodynamics (LV Gram): Angled pigtail Catheter  The right radial Sheath removed in cardiac catheter lab with TR band placement for hemostasis.  TR band:  1050 hours; 12 mL   The venous lines were removed and the PACU holding area with manual pressure for hemostasis.   EBL: < 10 ml, not including ABG and VBG samples   FINDINGS:  Hemodynamics:  Findings:   SaO2%  Pressures mmHg  Mean P  mmHg  EDP  mmHg   Right  Atrium    5/3    3   Right Ventricle    23/3    for   Pulmonary Artery   68   23/8   13    PCWP    9/8   7    Central Aortic  98   108/60  D6    Left Ventricle    108/10    10          Cardiac Output:   Cardiac Index:    Fick   3.67    2.27    Thermodilution   3.91    2.41     Left Ventriculography:  EF: Roughly 35 %  Wall Motion: Global hypokinesis  Coronary Anatomy: Left Dominant - with possible small right PDA  Left Main: Normal caliber vessel that bifurcates as usual into the LAD and Circumflex. Angiographically normal. LAD: Normal caliber vessel that has a tubular 30% proximal lesion followed by a roughly 30% focal lesion just prior to the takeoff of a major diagonal branch. The remainder vessels free of disease as it coursed around the apex. Diffuse the distal end for apex.  D1: Moderate caliber vessel coming off the mid LAD. Several lateral branches and is free of disease.  Left Circumflex: Large-caliber, dominant vessel that gives rise to 2 small marginal branches before bifurcating into a large lateral OM and the AV groove circumflex. The abuse her voice continues on to give a Left Posterolateral Branch (LPL) and a small Left Posterior Descending Artery (LPDA) that has a focal "napkin ring" 70% stenosis in the proximal portion of that vessel. The LPDA is only about a 1.5 mm vessel.  OM1: Large-caliber major obtuse marginal with 3 branches from the mid vessel. Mildly tortuous distal branches but no significant disease to   RCA: Moderate caliber vessel that at first blush appears to be probably nondominant. There may be a small right PDA that covers the proximal third of the inferior wall. The main RCA has a tubular 40% lesion in the mid vessel just prior to takeoff of a small marginal branch.    PATIENT DISPOSITION:    The patient was transferred to the PACU holding area in a hemodynamicaly stable, chest pain free condition.  The patient tolerated the procedure well, and  there were no complications.  EBL:   < 10 ml  The patient was stable before, during, and after the procedure.  POST-OPERATIVE DIAGNOSIS:    Angiographically moderate disease involving only the small Left Posterior Descending Artery branch otherwise minimal disease throughout. The LPA is not amenable to PCI with stent. It is likely a less than 2 mm vessel.  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  Normal Right Heart Pressures suggestive actually possible dehydration.  PLAN OF CARE:  Standard post radial arterial and femoral venous catheterization care. Anticipate discharge in 2-3 hours after bed rest.  Patient will followup with Dr. Percival Spanish the management of nonischemic cardiomyopathy  I did not make a medication adjustment as today as she was borderline hypotensive in the cath lab making titration of medications difficult.   Leonie Man, M.D., M.S. [facility] [address] [place], [place]  [address]  [phone]  10/04/2013 10:54 AM

## 2013-10-04 NOTE — Progress Notes (Signed)
Pt stated that she was having chest pain.  IT was a 2 to 3 at rest.  It increases with activity.  She stated that it was on the left side of her chest, and down her left arm.  We put patient on 2liters of oxygen per nasal cannula.  A STAT EKG was done.  1 0.4mg  sublingual nitro was given at 0745.  PTs BP at 0744 was 151/71  Pulse was 65 andxygen is 99 % on 2 liters.  I will recheck vitals at 0750 and re asses pain.  Will continue to monitor closely

## 2013-10-04 NOTE — Discharge Instructions (Signed)
Radial Site Care Refer to this sheet in the next few weeks. These instructions provide you with information on caring for yourself after your procedure. Your caregiver may also give you more specific instructions. Your treatment has been planned according to current medical practices, but problems sometimes occur. Call your caregiver if you have any problems or questions after your procedure. HOME CARE INSTRUCTIONS  You may shower the day after the procedure.Remove the bandage (dressing) and gently wash the site with plain soap and water.Gently pat the site dry.  Do not apply powder or lotion to the site.  Do not submerge the affected site in water for 3 to 5 days.  Inspect the site at least twice daily.  Do not flex or bend the affected arm for 24 hours.  No lifting over 5 pounds (2.3 kg) for 5 days after your procedure.  Do not drive home if you are discharged the same day of the procedure. Have someone else drive you.  You may drive 24 hours after the procedure unless otherwise instructed by your caregiver.  Do not operate machinery or power tools for 24 hours.  A responsible adult should be with you for the first 24 hours after you arrive home. What to expect:  Any bruising will usually fade within 1 to 2 weeks.  Blood that collects in the tissue (hematoma) may be painful to the touch. It should usually decrease in size and tenderness within 1 to 2 weeks. SEEK IMMEDIATE MEDICAL CARE IF:  You have unusual pain at the radial site.  You have redness, warmth, swelling, or pain at the radial site.  You have drainage (other than a small amount of blood on the dressing).  You have chills.  You have a fever or persistent symptoms for more than 72 hours.  You have a fever and your symptoms suddenly get worse.  Your arm becomes pale, cool, tingly, or numb.  You have heavy bleeding from the site. Hold pressure on the site. Document Released: 02/20/2010 Document Revised:  04/12/2011 Document Reviewed: 02/20/2010 [facility] Patient Information 2015 [employer] [place], [place]. This information is not intended to replace advice given to you by your health care provider. Make sure you discuss any questions you have with your health care provider.  Angiogram, Care After Refer to this sheet in the next few weeks. These instructions provide you with information on caring for yourself after your procedure. Your health care provider may also give you more specific instructions. Your treatment has been planned according to current medical practices, but problems sometimes occur. Call your health care provider if you have any problems or questions after your procedure.  WHAT TO EXPECT AFTER THE PROCEDURE After your procedure, it is typical to have the following sensations:  Minor discomfort or tenderness and a small bump at the catheter insertion site. The bump should usually decrease in size and tenderness within 1 to 2 weeks.  Any bruising will usually fade within 2 to 4 weeks. HOME CARE INSTRUCTIONS   You may need to keep taking blood thinners if they were prescribed for you. Take medicines only as directed by your health care provider.  Do not apply powder or lotion to the site.  Do not take baths, swim, or use a hot tub until your health care provider approves.  You may shower 24 hours after the procedure. Remove the bandage (dressing) and gently wash the site with plain soap and water. Gently pat the site dry.  Inspect the site at least  twice daily.  Limit your activity for the first 48 hours. Do not bend, squat, or lift anything over 20 lb (9 kg) or as directed by your health care provider.  Plan to have someone take you home after the procedure. Follow instructions about when you can drive or return to work. SEEK MEDICAL CARE IF:  You get light-headed when standing up.  You have drainage (other than a small amount of blood on the dressing).  You have chills.  You  have a fever.  You have redness, warmth, swelling, or pain at the insertion site. SEEK IMMEDIATE MEDICAL CARE IF:   You develop chest pain or shortness of breath, feel faint, or pass out.  You have bleeding, swelling larger than a walnut, or drainage from the catheter insertion site.  You develop pain, discoloration, coldness, or severe bruising in the leg or arm that held the catheter.  You have heavy bleeding from the site. If this happens, hold pressure on the site and call 911. MAKE SURE YOU:  Understand these instructions.  Will watch your condition.  Will get help right away if you are not doing well or get worse. Document Released: 08/06/2004 Document Revised: 06/04/2013 Document Reviewed: 06/12/2012 [facility] Patient Information 2015 [place], [place]. This information is not intended to replace advice given to you by your health care provider. Make sure you discuss any questions you have with your health care provider.

## 2013-10-04 NOTE — Progress Notes (Signed)
Site area: right groin a  7 fr sheath from venous.   Site Prior to Removal:  Level 0  Pressure Applied For  20  MINUTES    Minutes Beginning at  1150am  Manual:   Yes.    Patient Status During Pull:  stable  Post Pull Groin Site:  Level 0  Post Pull Instructions Given:  Yes.    Post Pull Pulses Present:  Yes.    Dressing Applied:  Yes.    Comments:  VS remain stable during sheath pull.  Pt denies any pain at site.

## 2013-10-04 NOTE — Interval H&P Note (Signed)
History and Physical Interval Note:  10/04/2013 9:30 AM  Kimberly Merritt  has presented today for surgery, with the diagnosis of cardiomyopathy & CHEST PAIN.  The various methods of treatment have been discussed with the patient and family. After consideration of risks, benefits and other options for treatment, the patient has consented to  Procedure(s): LEFT AND RIGHT HEART CATHETERIZATION WITH CORONARY ANGIOGRAM (N/A) as a surgical intervention .  [name] history has been reviewed, patient examined, no change in status, stable for surgery.  I have reviewed the patient's chart and labs.  Questions were answered to the patient's satisfaction.     [name] W  Cath Lab Visit (complete for each Cath Lab visit)  Clinical Evaluation Leading to the Procedure:   ACS: No.  Non-ACS:    Anginal Classification: No Symptoms - SHARP CHEST PAIN  Anti-ischemic medical therapy: No Therapy  Non-Invasive Test Results: Intermediate-risk stress test findings: cardiac mortality 1-3%/year - ef 30% BY eCHO  Prior CABG: No previous CABG

## 2013-10-04 NOTE — Progress Notes (Signed)
pt was re evaluated she stated that her pain is now a zero.  Her BP is 119/64.  Will continue to monitor closely

## 2013-10-04 NOTE — Progress Notes (Signed)
Site area: right brachial  Site Prior to Removal:  Level 0  Pressure Applied For 15 MINUTES    Minutes Beginning at 1120am  Manual:   Yes.    Patient Status During Pull:  stable  Post Pull Groin Site:  Level 0  Post Pull Instructions Given:  Yes.    Post Pull Pulses Present:  Yes.    Dressing Applied:  Yes.    Comments:  Hemostatis achieved. VS remain stable, and pt denies any discomfort at this time.

## 2013-10-04 NOTE — H&P (View-Only) (Signed)
HPI The patient presents for a week and cardiomyopathy. In the emergency room the other day. She was referred here because of a low heart rate. However, her heart rate was never documented to be in the 30s as had been palpated earlier in a doctor's office. Rather she had ectopy which we thought to be under tablet. She is not feeling any overt symptoms. She would have some vague episodes of feeling like she couldn't take a deep breath. She was not however having any presyncope or syncope. She was in fact hypertensive. She was denying any chest pressure, neck or arm discomfort. Troponins were negative her BNP was slightly elevated. I did send her for an echocardiogram which demonstrates global hypokinesis with an EF of 30%. A Holter monitor result is pending. She says that she does have a little bit of  Staggering in her gait but this is not new. She's had some fleeting sharp chest discomfort.  However, she denies any chest pressure, neck or arm discomfort. She's not describing PND or orthopnea. She's had no change in her weight.  Allergies  Allergen Reactions  . Acetaminophen     REACTION: high dose - tremor  . Alendronate Sodium     Stomach upset.   . Contrast Media [Iodinated Diagnostic Agents]     Syncope.   . Diflunisal     Stomach pain.    . Doxycycline     n/v  . Flexeril [Cyclobenzaprine]     Abdominal pain.    . Nabumetone     Abdominal pain; dark, tarry stool.    . Oxybutynin     Pt does not remember.    . Raloxifene     Unknown.    . Sulfa Antibiotics Nausea Only    Syncope.    . Sulfonamide Derivatives   . Symbicort [Budesonide-Formoterol Fumarate]     "Jittery-ness."   . Tramadol Hcl     Numbness.    . Diclofenac Palpitations    Dark, tarry stool.      Current Outpatient Prescriptions  Medication Sig Dispense Refill  . albuterol (PROVENTIL HFA;VENTOLIN HFA) 108 (90 BASE) MCG/ACT inhaler Inhale 1-2 puffs into the lungs every 4 (four) hours as needed for wheezing or  shortness of breath.      . Cholecalciferol (VITAMIN D3) 1000 UNITS CAPS Take by mouth daily.        Marland Kitchen Fexofenadine HCl (ALLEGRA PO) Take 1 tablet by mouth every evening.       Marland Kitchen glucosamine-chondroitin 500-400 MG tablet Take 1 tablet by mouth daily.        Marland Kitchen HYDROcodone-acetaminophen (NORCO) 10-325 MG per tablet Take 0.5-1 tablets by mouth every 8 (eight) hours as needed for moderate pain or severe pain.  100 tablet  0  . levothyroxine (SYNTHROID, LEVOTHROID) 25 MCG tablet Take 25 mcg by mouth daily before breakfast.      . Loratadine-Pseudoephedrine (CLARITIN-D 12 HOUR PO) Take 1 tablet by mouth daily as needed (congestion.  Do not take with fexofenadine.).       Marland Kitchen meloxicam (MOBIC) 7.5 MG tablet       . methocarbamol (ROBAXIN) 500 MG tablet Take 500 mg by mouth every 6 (six) hours as needed for muscle spasms.       . NON FORMULARY Allergy injections       . OVER THE COUNTER MEDICATION Take 1 packet by mouth daily. QUALIFIBER POWDER -      . pantoprazole (PROTONIX) 40 MG tablet Take 1  tablet (40 mg total) by mouth daily.  30 tablet  3   No current facility-administered medications for this visit.    Past Medical History  Diagnosis Date  . HYPOTHYROIDISM 08/29/2006  . 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 GERD 08/29/2006  . HEPATIC CYST 08/02/2007  . OSTEOARTHRITIS 08/29/2006  . LOW BACK PAIN 08/29/2006  . OSTEOPENIA 11/19/2008  . PARESTHESIA 03/05/2009  . LIVER HEMANGIOMA 03/03/2010  . 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 MUSCLE SPASM 03/03/2010  . DEPRESSION 08/29/2006  . Diverticulosis   . Scoliosis     Past Surgical History  Procedure Laterality Date  . Tonsillectomy    . Abdominal hysterectomy    . Appendectomy    . Carpal tunnel release      both  . Bunionectomy      both  . Back surgery  2010,2006    lumb fusion  . Cataract extraction      both  . Shoulder arthroscopy  02/09/2011    Procedure: ARTHROSCOPY SHOULDER;  Surgeon: Hessie Dibble, MD;  Location: [place];  Service: Orthopedics;  Laterality: Right;  right shoulder arthroscopy subacromial decompression with rotator cuff repair    ROS:  As stated in the HPI and negative for all other systems.   PHYSICAL EXAM BP 134/80  Pulse 86  Ht 5\' 4"  (1.626 m)  Wt 126 lb (57.153 kg)  BMI 21.62 kg/m2 GENERAL:  Well appearing HEENT:  Pupils equal round and reactive, fundi not visualized, oral mucosa unremarkable NECK:  No jugular venous distention, waveform within normal limits, carotid upstroke brisk and symmetric, no bruits, no thyromegaly LYMPHATICS:  No cervical, inguinal adenopathy LUNGS:  Clear to auscultation bilaterally BACK:  No CVA tenderness CHEST:  Unremarkable HEART:  PMI not displaced or sustained,S1 and S2 within normal limits, no S3, positive S4, no clicks, no rubs, no murmurs ABD:  Flat, positive bowel sounds normal in frequency in pitch, no bruits, no rebound, no guarding, no midline pulsatile mass, no hepatomegaly, no splenomegaly EXT:  2 plus pulses throughout, no edema, no cyanosis no clubbing SKIN:  No rashes no nodules NEURO:  Cranial nerves II through XII grossly intact, motor grossly intact throughout PSYCH:  Cognitively intact, oriented to person place and time   ASSESSMENT AND PLAN  CARDIOMYPATHY:  She will need right and left heart catheterization to further evaluate this.The patient understands that risks included but are not limited to stroke (1 in 1000), death (1 in 12), kidney failure [usually temporary] (1 in 500), bleeding (1 in 200), allergic reaction [possibly serious] (1 in 200).  The patient understands and agrees to proceed.   I will arrange this for this week. I will start a low dose of ACE inhibitor. We can further titrate meds if her blood pressure allows at the time of catheterization.  HTN:  Her blood pressure has been fluctuating but I suspect that it's more elevated than low as it was in the emergency room. We'll be managed in the  context of treating her cardiomyopathy.  PVCs:  She was having frequent ventricular ectopy. Results the monitor are pending.  DYE ALLERGY:   She will be treated for contrast dye allergy.

## 2013-10-05 ENCOUNTER — Encounter: Payer: Self-pay | Admitting: *Deleted

## 2013-10-05 ENCOUNTER — Other Ambulatory Visit: Payer: Self-pay | Admitting: *Deleted

## 2013-10-05 ENCOUNTER — Telehealth: Payer: Self-pay | Admitting: Cardiology

## 2013-10-05 DIAGNOSIS — D472 Monoclonal gammopathy: Secondary | ICD-10-CM

## 2013-10-05 NOTE — Progress Notes (Signed)
Completed chart and gave to [facility] to enter labs and return to me.

## 2013-10-05 NOTE — Telephone Encounter (Signed)
Pt stated that she just had a catheterization done and would like to know if 9/24 is too long of a wait for her to come in for a f/u visit. Dr.Wood's did not specify why she should come in for a follow up. She would like for JC to call her if possible. Please call  Thanks

## 2013-10-05 NOTE — Progress Notes (Signed)
Pt.has had cath done these were cath labs

## 2013-10-05 NOTE — Telephone Encounter (Signed)
Pt informed that it was ok to wait until to be seen on the 24th of this month

## 2013-10-09 ENCOUNTER — Telehealth: Payer: Self-pay | Admitting: Cardiology

## 2013-10-09 ENCOUNTER — Encounter: Payer: Self-pay | Admitting: *Deleted

## 2013-10-09 NOTE — Telephone Encounter (Signed)
She should discuss this with her primary MD.  She has no obstructive large vessel CAD.

## 2013-10-09 NOTE — Telephone Encounter (Signed)
Spoke with patient. Informed her that Dr. Percival Spanish OK'ed her getting her allergy shot.   While on phone, patient had complains of headaches when she wakes up in AM for past 2-3 days. She reports she is not getting a restful night sleep, waking up about every 1.5-2 hours. She also complains of feeling tired and discomfort at "top of heart" that is "dull"  Message sent to Dr. Percival Spanish

## 2013-10-09 NOTE — Telephone Encounter (Signed)
Pt wants to know if it  will be all right for her to get her monthly allergy shot?

## 2013-10-09 NOTE — Progress Notes (Signed)
Received chart back from [facility].  Added to spreadsheet and placed in Dr. Virgie Dad box.

## 2013-10-09 NOTE — Telephone Encounter (Signed)
Message forwarded to Dr. Salome Spotted to advise

## 2013-10-09 NOTE — Telephone Encounter (Signed)
Patient notified of advise per Dr. Percival Spanish. Advised to contact PCP r/t headaches and contact our office shoulder chest discomfort persist, worsen or become more frequent

## 2013-10-09 NOTE — Telephone Encounter (Signed)
Yes

## 2013-10-17 ENCOUNTER — Telehealth: Payer: Self-pay | Admitting: Cardiology

## 2013-10-17 NOTE — Telephone Encounter (Signed)
Pt states her blood pressure keep going up,very concrened. Last night it was 152/70,did not feel good yesterday.Please call,it was elevated all day yesterday.Marland Kitchen

## 2013-10-17 NOTE — Telephone Encounter (Signed)
C/o fluctuating BP's ranging from 117-152/61-97 with heart rates ranging in the 60's.  States she didn't feel well yesterday had some chest discomfort that would come and go and generally didn't feel good.  Placed on Dr. Rosezella [place] schedule in AM.  Patient agreed and voiced understanding.  Instructed to go to [facility] ER if she has any chest discomfort that does not resolve quickly or increases in intensity or frequency.  Voiced understanding.

## 2013-10-18 ENCOUNTER — Other Ambulatory Visit: Payer: Medicare Other

## 2013-10-18 ENCOUNTER — Ambulatory Visit (INDEPENDENT_AMBULATORY_CARE_PROVIDER_SITE_OTHER): Payer: Medicare Other | Admitting: Cardiology

## 2013-10-18 ENCOUNTER — Encounter: Payer: Self-pay | Admitting: Cardiology

## 2013-10-18 VITALS — BP 108/68 | HR 83 | Ht 64.0 in | Wt 128.7 lb

## 2013-10-18 DIAGNOSIS — D472 Monoclonal gammopathy: Secondary | ICD-10-CM

## 2013-10-18 DIAGNOSIS — R079 Chest pain, unspecified: Secondary | ICD-10-CM

## 2013-10-18 MED ORDER — CARVEDILOL 3.125 MG PO TABS: 3.1250 mg | ORAL_TABLET | Freq: Two times a day (BID) | ORAL | Status: DC

## 2013-10-18 NOTE — Patient Instructions (Signed)
Your physician recommends that you schedule a follow-up appointment in:  Next week  Take carvedilol 3.125 mg as directed

## 2013-10-18 NOTE — Progress Notes (Signed)
HPI The patient presents for follow up of cardiomyopathy. She has been noted to have an ejection fraction of 30%. This was brought to attention after she was found to have frequent premature ventricular contractions. Cardiac catheterization demonstrated nonobstructive and small vessel disease. She called to be added to the schedule.  Having some sharp chest discomfort of her left upper chest. In addition she was worried about blood pressure fluctuating. However, really staying in the 110s sometimes as high as 381 systolic.  She's not having any overt palpitations, presyncope or syncope. She's not having any substernal chest pressure jaw or arm discomfort. She is fatigued when she tries to do some yard work.  Allergies  Allergen Reactions  . Acetaminophen     REACTION: high dose - tremor  . Alendronate Sodium     Stomach upset.   . Contrast Media [Iodinated Diagnostic Agents]     Syncope.   . Diflunisal     Stomach pain.    . Doxycycline     n/v  . Flexeril [Cyclobenzaprine]     Abdominal pain.    . Nabumetone     Abdominal pain; dark, tarry stool.    . Oxybutynin     Pt does not remember.    . Raloxifene     Unknown.    . Sulfa Antibiotics Nausea Only    Syncope.    . Sulfonamide Derivatives   . Symbicort [Budesonide-Formoterol Fumarate]     "Jittery-ness."   . Tramadol Hcl     Numbness.    . Diclofenac Palpitations    Dark, tarry stool.      Current Outpatient Prescriptions  Medication Sig Dispense Refill  . albuterol (PROVENTIL HFA;VENTOLIN HFA) 108 (90 BASE) MCG/ACT inhaler Inhale 1-2 puffs into the lungs every 4 (four) hours as needed for wheezing or shortness of breath.      . calcium carbonate (TUMS EX) 750 MG chewable tablet Chew 2 tablets by mouth daily as needed for heartburn.      . Cholecalciferol (VITAMIN D3) 1000 UNITS CAPS Take by mouth daily.        Marland Kitchen Fexofenadine HCl (ALLEGRA PO) Take 1 tablet by mouth every evening. 80 mg      . glucosamine-chondroitin  500-400 MG tablet Take 1 tablet by mouth 2 (two) times daily.       Marland Kitchen levothyroxine (SYNTHROID, LEVOTHROID) 25 MCG tablet Take 25 mcg by mouth daily before breakfast.      . lisinopril (PRINIVIL,ZESTRIL) 2.5 MG tablet Take 1 tablet (2.5 mg total) by mouth daily.  90 tablet  3  . meloxicam (MOBIC) 7.5 MG tablet       . methocarbamol (ROBAXIN) 500 MG tablet Take 500 mg by mouth every 6 (six) hours as needed for muscle spasms.       . NON FORMULARY Allergy injections       . OVER THE COUNTER MEDICATION Take 1 packet by mouth daily. QUALIFIBER POWDER -      . pantoprazole (PROTONIX) 40 MG tablet Take 1 tablet (40 mg total) by mouth daily.  30 tablet  3  . Propylene Glycol-Glycerin (MOISTURE EYES OP) Apply 2 drops to eye daily as needed (dry eyes, allergies).       No current facility-administered medications for this visit.    Past Medical History  Diagnosis Date  . HYPOTHYROIDISM 08/29/2006  . 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 GERD 08/29/2006  . HEPATIC CYST 08/02/2007  . OSTEOARTHRITIS 08/29/2006  . LOW  BACK PAIN 08/29/2006  . OSTEOPENIA 11/19/2008  . PARESTHESIA 03/05/2009  . LIVER HEMANGIOMA 03/03/2010  . 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 MUSCLE SPASM 03/03/2010  . DEPRESSION 08/29/2006  . Diverticulosis   . Scoliosis     Past Surgical History  Procedure Laterality Date  . Tonsillectomy    . Abdominal hysterectomy    . Appendectomy    . Carpal tunnel release      both  . Bunionectomy      both  . Back surgery  2010,2006    lumb fusion  . Cataract extraction      both  . Shoulder arthroscopy  02/09/2011    Procedure: ARTHROSCOPY SHOULDER;  Surgeon: Hessie Dibble, MD;  Location: [place];  Service: Orthopedics;  Laterality: Right;  right shoulder arthroscopy subacromial decompression with rotator cuff repair    ROS:  As stated in the HPI and negative for all other systems.   PHYSICAL EXAM Ht 5\' 4"  (1.626 m)  Wt 128 lb 11.2 oz (58.378 kg)  BMI  22.08 kg/m2 GENERAL:  Well appearing NECK:  No jugular venous distention, waveform within normal limits, carotid upstroke brisk and symmetric, no bruits, no thyromegaly LUNGS:  Clear to auscultation bilaterally BACK:  No CVA tenderness CHEST:  Unremarkable HEART:  PMI not displaced or sustained,S1 and S2 within normal limits, no S3, positive S4, no clicks, no rubs, no murmurs ABD:  Flat, positive bowel sounds normal in frequency in pitch, no bruits, no rebound, no guarding, no midline pulsatile mass, no hepatomegaly, no splenomegaly EXT:  2 plus pulses throughout, no edema, no cyanosis no clubbing  EKG:  Sinus rhythm, rate 83, axis within normal limits, intervals within normal limits, nonspecific unchanged lateral T-wave changes, frequent ventricular ectopy. 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  I won't titrate her medications further by adding carvedilol 3.125 mg twice daily.  HTN:  Her blood pressure is actually well controlled and I tried to reassure her that it will fluctuate slightly. She will be treated in the context of managing her cardiomyopathy.  PVCs:  She was having frequent ventricular ectopy. These were confirmed a Holter. In pursuing aggressive titration of her medications. Her EF stays low we will need to consider an ICD.

## 2013-10-22 LAB — PROTEIN ELECTROPHORESIS, SERUM
Albumin ELP: 58.6 % (ref 55.8–66.1)
Alpha-1-Globulin: 4.2 % (ref 2.9–4.9)
Alpha-2-Globulin: 10.6 % (ref 7.1–11.8)
Beta 2: 2.6 % — ABNORMAL LOW (ref 3.2–6.5)
Beta Globulin: 19.3 % — ABNORMAL HIGH (ref 4.7–7.2)
Gamma Globulin: 4.7 % — ABNORMAL LOW (ref 11.1–18.8)
M-Spike, %: 0.87 g/dL
Total Protein, Serum Electrophoresis: 6.6 g/dL (ref 6.0–8.3)

## 2013-10-22 LAB — KAPPA/LAMBDA LIGHT CHAINS
Kappa free light chain: 1.24 mg/dL (ref 0.33–1.94)
Kappa:Lambda Ratio: 1.61 (ref 0.26–1.65)
Lambda Free Lght Chn: 0.77 mg/dL (ref 0.57–2.63)

## 2013-10-24 ENCOUNTER — Ambulatory Visit: Payer: Medicare Other

## 2013-10-24 ENCOUNTER — Encounter: Payer: Self-pay | Admitting: Oncology

## 2013-10-24 ENCOUNTER — Ambulatory Visit (HOSPITAL_BASED_OUTPATIENT_CLINIC_OR_DEPARTMENT_OTHER): Payer: Medicare Other | Admitting: Oncology

## 2013-10-24 ENCOUNTER — Telehealth: Payer: Self-pay | Admitting: Oncology

## 2013-10-24 VITALS — BP 128/66 | HR 69 | Temp 97.8°F | Resp 18 | Ht 64.0 in | Wt 130.1 lb

## 2013-10-24 DIAGNOSIS — D472 Monoclonal gammopathy: Secondary | ICD-10-CM

## 2013-10-24 DIAGNOSIS — I428 Other cardiomyopathies: Secondary | ICD-10-CM

## 2013-10-24 DIAGNOSIS — D801 Nonfamilial hypogammaglobulinemia: Secondary | ICD-10-CM

## 2013-10-24 NOTE — Progress Notes (Signed)
Checked in new pt with no financial concerns. °

## 2013-10-24 NOTE — Telephone Encounter (Signed)
, °

## 2013-10-25 ENCOUNTER — Encounter: Payer: Self-pay | Admitting: Cardiology

## 2013-10-25 ENCOUNTER — Ambulatory Visit (INDEPENDENT_AMBULATORY_CARE_PROVIDER_SITE_OTHER): Payer: Medicare Other | Admitting: Cardiology

## 2013-10-25 VITALS — BP 122/68 | HR 71 | Ht 64.0 in | Wt 129.7 lb

## 2013-10-25 DIAGNOSIS — I429 Cardiomyopathy, unspecified: Secondary | ICD-10-CM

## 2013-10-25 DIAGNOSIS — I428 Other cardiomyopathies: Secondary | ICD-10-CM

## 2013-10-25 NOTE — Patient Instructions (Signed)
Your physician recommends that you schedule a follow-up appointment in: 6 weeks with Dr. Hochrein  

## 2013-10-25 NOTE — Progress Notes (Signed)
HPI The patient presents for follow up of cardiomyopathy. She has been noted to have an ejection fraction of 30%. This was brought to attention after she was found to have frequent premature ventricular contractions. Cardiac catheterization demonstrated nonobstructive and small vessel disease.  At the last visit I titrated her beta blocker slightly.  She did have some lightheadedness with this. However, she says she is slowly adjusting. She's not noticed any new palpitations or dyspnea. She's had no presyncope or syncope. She did show me her blood pressure diary her blood pressures are well controlled. Heart rates are in the 60s although this might include under counting her ectopy.   Allergies  Allergen Reactions  . Acetaminophen     REACTION: high dose - tremor  . Alendronate Sodium     Stomach upset.   . Contrast Media [Iodinated Diagnostic Agents]     Syncope.   . Diflunisal     Stomach pain.    . Doxycycline     n/v  . Flexeril [Cyclobenzaprine]     Abdominal pain.    . Nabumetone     Abdominal pain; dark, tarry stool.    . Oxybutynin     Pt does not remember.    . Raloxifene     Unknown.    . Sulfa Antibiotics Nausea Only    Syncope.    . Sulfonamide Derivatives   . Symbicort [Budesonide-Formoterol Fumarate]     "Jittery-ness."   . Tramadol Hcl     Numbness.    . Diclofenac Palpitations    Dark, tarry stool.      Current Outpatient Prescriptions  Medication Sig Dispense Refill  . albuterol (PROVENTIL HFA;VENTOLIN HFA) 108 (90 BASE) MCG/ACT inhaler Inhale 1-2 puffs into the lungs every 4 (four) hours as needed for wheezing or shortness of breath.      . calcium carbonate (TUMS EX) 750 MG chewable tablet Chew 2 tablets by mouth daily as needed for heartburn.      . carboxymethylcellulose (REFRESH PLUS) 0.5 % SOLN 2 drops as needed.      . carvedilol (COREG) 3.125 MG tablet Take 1 tablet (3.125 mg total) by mouth 2 (two) times daily.  180 tablet  3  . cetirizine  (ZYRTEC) 10 MG tablet Take 10 mg by mouth daily.      . Cholecalciferol (VITAMIN D3) 1000 UNITS CAPS Take by mouth daily.        Marland Kitchen glucosamine-chondroitin 500-400 MG tablet Take 1 tablet by mouth 2 (two) times daily.       Marland Kitchen levothyroxine (SYNTHROID, LEVOTHROID) 25 MCG tablet Take 25 mcg by mouth daily before breakfast.      . lisinopril (PRINIVIL,ZESTRIL) 2.5 MG tablet Take 1 tablet (2.5 mg total) by mouth daily.  90 tablet  3  . meloxicam (MOBIC) 7.5 MG tablet Take 7.5 mg by mouth as needed.       . methocarbamol (ROBAXIN) 500 MG tablet Take 500 mg by mouth every 6 (six) hours as needed for muscle spasms.       . NON FORMULARY Allergy injections       . OVER THE COUNTER MEDICATION Take 1 packet by mouth daily. QUALIFIBER POWDER -      . pantoprazole (PROTONIX) 40 MG tablet Take 1 tablet (40 mg total) by mouth daily.  30 tablet  3   No current facility-administered medications for this visit.    Past Medical History  Diagnosis Date  . HYPOTHYROIDISM 08/29/2006  . VARICOSE  VEINS, LOWER EXTREMITIES 08/29/2006  . GERD 08/29/2006  . HEPATIC CYST 08/02/2007  . OSTEOARTHRITIS 08/29/2006  . LOW BACK PAIN 08/29/2006  . OSTEOPENIA 11/19/2008  . PARESTHESIA 03/05/2009  . LIVER HEMANGIOMA 03/03/2010  . 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 MUSCLE SPASM 03/03/2010  . DEPRESSION 08/29/2006  . Diverticulosis   . Scoliosis   . CAD (coronary artery disease)     Nonobstructive. LAD 30% stenosis, small PDA of the circumflex 70% stenosis, right coronary artery 40% stenosis. Catheterization September 2015  . Nonischemic cardiomyopathy     EF 30%  . PVC's (premature ventricular contractions)     Past Surgical History  Procedure Laterality Date  . Tonsillectomy    . Abdominal hysterectomy    . Appendectomy    . Carpal tunnel release      both  . Bunionectomy      both  . Back surgery  2010,2006    lumb fusion  . Cataract extraction      both  . Shoulder arthroscopy  02/09/2011    Procedure: ARTHROSCOPY  SHOULDER;  Surgeon: Hessie Dibble, MD;  Location: [place];  Service: Orthopedics;  Laterality: Right;  right shoulder arthroscopy subacromial decompression with rotator cuff repair    ROS:  As stated in the HPI and negative for all other systems.   PHYSICAL EXAM BP 122/68  Pulse 71  Ht 5\' 4"  (1.626 m)  Wt 129 lb 11.2 oz (58.832 kg)  BMI 22.25 kg/m2 GENERAL:  Well appearing NECK:  No jugular venous distention, waveform within normal limits, carotid upstroke brisk and symmetric, no bruits, no thyromegaly LUNGS:  Clear to auscultation bilaterally BACK:  No CVA tenderness CHEST:  Unremarkable HEART:  PMI not displaced or sustained,S1 and S2 within normal limits, no S3, positive S4, no clicks, no rubs, no murmurs ABD:  Flat, positive bowel sounds normal in frequency in pitch, no bruits, no rebound, no guarding, no midline pulsatile mass, no hepatomegaly, no splenomegaly EXT:  2 plus pulses throughout, no edema, no cyanosis no clubbing  EKG:  Sinus rhythm, rate 83, axis within normal limits, intervals within normal limits, nonspecific unchanged lateral T-wave changes, frequent ventricular ectopy. 10/25/2013  ASSESSMENT AND PLAN  CARDIOMYPATHY:  Given her lightheadedness with the beta blocker increase I will make no change to her meds.  We will move slowly on med titration.   HTN: This is being managed in the context of treating his CHF  PVCs:  She was having frequent ventricular ectopy. These were confirmed a Holter. In pursuing titration of her medications. Her EF stays low we will need to consider an ICD.

## 2013-10-27 NOTE — Progress Notes (Signed)
[facility]  [phone] [phone]     ID: Kimberly Merritt DOB: August 20, 1940  MR#: [id number]  FTN#:[id number]  Patient Care Team: Cassandria Anger, MD as PCP - General Hessie Dibble, MD as Attending Physician (Orthopedic Surgery) Chauncey Cruel, MD (Hematology and Oncology) Simona Huh, MD as Consulting Physician (Dermatology) Jeryl Columbia, MD (Gastroenterology) Eulis Manly. Gershon Crane, MD as Attending Physician (Ophthalmology) Terrance Mass, MD (Obstetrics and Gynecology) Mal Misty, MD as Attending Physician (Vascular Surgery) Hosie Spangle, MD as Consulting Physician (Neurosurgery) OTHER MD: Minus Breeding MD  CHIEF COMPLAINT: M-GUS/ low-grade lymphoproliferative process  CURRENT TREATMENT: Observation   HISTORY PRESENT ILLNESS:: "Kimberly Merritt" Merritt is a 73 y/o [place] woman I evaluated originally in October of 2004 for a monoclonal gammopathy. At that time she had a white cell count of 7.2, hemoglobin 12.8, MCV 94.5, and platelets 298,000. Protein workup showed an IgM kappa paraprotein of 0.6 g, with a total IgG slightly depressed at 556, but a normal IgA at 82. Because an IgM paraprotein can be associated with a low grade lymphoproliferative process (Waldenstrm's macroglobulinemia) [name] underwent bone marrow biopsy 11/27/2002, which showed ([id number]) a normal cellular marrow with trilineage hematopoiesis and 5% plasma cells. The peripheral blood film showed no significant abnormalities and on the biopsy para-spicular lymphoid aggregates were not noted. 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  The patient was followed with observation alone through May of 2009, with no evidence of progression in her IgM kappa clone. As of 06/03/2007 her total IgM spike was 0.83, her IgG was 490 and her IgA was 36. The patient was released from followup here at that time.  More recently, as the 10  year anniversary of her original bone marrow biopsy past, Dr. Dorann Lodge of referred the patient back for reassessment and possible bone marrow rebiopsy. Kimberly Merritt was scheduled here for lab work 09/25/2013. However when she had her blood drawn she complained of some shortness of breath and her pulse was checked. It was 33. The patient was referred to the emergency room where she was evaluated by Dr. Percival Spanish who felt the patient's true heart rate was higher (she has ectopic beats which generated a week pulse and may have been missed) since in the emergency room EKG her rate was 98 with normal sinus rhythm. Troponins were negative. BNP was slightly elevated.  Dr. Percival Spanish set the patient up for an echocardiogram 09/27/2013 and this showed an ejection fraction of 30-35%. There was diffuse hypokinesis. She underwent right and left heart catheterization 10/04/2013 which showed no significant obstructions. The study did confirm an ejection fraction of roughly 35% with global hypokinesis. She is followed by Dr. Percival Spanish for her new diagnosis of non-ischemic cardiomyopathy.  INTERVAL HISTORY/ REVIEW OF SYSTEMS: Given the recent cardiac developments, Kimberly Merritt is remarkably asymptomatic. She is tolerating her chloride and lisinopril without any side effects that she is aware of. She denies shortness of breath, chest pain or pressure, cough, pleurisy, or peripheral edema symptoms. In fact a detailed review of systems today was entirely benign   PAST MEDICAL HISTORY: Past Medical History  Diagnosis Date  . HYPOTHYROIDISM 08/29/2006  . 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 GERD 08/29/2006  . HEPATIC CYST 08/02/2007  . OSTEOARTHRITIS 08/29/2006  . LOW BACK PAIN 08/29/2006  . OSTEOPENIA 11/19/2008  . PARESTHESIA 03/05/2009  . LIVER HEMANGIOMA 03/03/2010  . 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 MUSCLE SPASM 03/03/2010  .  DEPRESSION 08/29/2006  . Diverticulosis   . Scoliosis   . CAD (coronary artery disease)      Nonobstructive. LAD 30% stenosis, small PDA of the circumflex 70% stenosis, right coronary artery 40% stenosis. Catheterization September 2015  . Nonischemic cardiomyopathy     EF 30%  . PVC's (premature ventricular contractions)     PAST SURGICAL HISTORY: Past Surgical History  Procedure Laterality Date  . Tonsillectomy    . Abdominal hysterectomy    . Appendectomy    . Carpal tunnel release      both  . Bunionectomy      both  . Back surgery  04-15-08    lumb fusion  . Cataract extraction      both  . Shoulder arthroscopy  02/09/2011    Procedure: ARTHROSCOPY SHOULDER;  Surgeon: Hessie Dibble, MD;  Location: [facility];  Service: Orthopedics;  Laterality: Right;  right shoulder arthroscopy subacromial decompression with rotator cuff repair    FAMILY HISTORY Family History  Problem Relation Age of Onset  . Hypertension Other   . Stroke Mother    the patient's father died at the age of 73 from lung cancer in the setting of tobacco abuse. [name] mother died at the age of 48 following a stroke. The patient had one brother, who is severely retarded and died from pneumonia at the age of 39. There were no sisters. There is no history of blood problems or cancer in the family to the patient's knowledge  GYNECOLOGIC HISTORY:  No LMP recorded. Patient is postmenopausal. Menarche age 71. The patient is GX P0. She stopped having periods in 04-16-1978 and took hormone replacement for almost 20 years.  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 Her husband died in 1991-04-16 from a myocardial infarction. She lives by herself, with one cat for company.    ADVANCED DIRECTIVES: Not in place. If she became disabled she would want [place] to contact her neighbor and third cousin Andrey Cota at [place]: History  Substance Use Topics  . Smoking status: Never Smoker   . Smokeless tobacco: Never Used  . Alcohol Use: No       Colonoscopy: 15-Apr-2012  PAP: 16-Apr-2011  Bone density: 2010-04-16  Lipid panel:  Allergies  Allergen Reactions  . Acetaminophen     REACTION: high dose - tremor  . Alendronate Sodium     Stomach upset.   . Contrast Media [Iodinated Diagnostic Agents]     Syncope.   . Diflunisal     Stomach pain.    . Doxycycline     n/v  . Flexeril [Cyclobenzaprine]     Abdominal pain.    . Nabumetone     Abdominal pain; dark, tarry stool.    . Oxybutynin     Pt does not remember.    . Raloxifene     Unknown.    . Sulfa Antibiotics Nausea Only    Syncope.    . Sulfonamide Derivatives   . Symbicort [Budesonide-Formoterol Fumarate]     "Jittery-ness."   . Tramadol Hcl     Numbness.    . Diclofenac Palpitations    Dark, tarry stool.      Current Outpatient Prescriptions  Medication Sig Dispense Refill  . albuterol (PROVENTIL HFA;VENTOLIN HFA) 108 (90 BASE) MCG/ACT inhaler Inhale 1-2 puffs into the lungs every 4 (four) hours as needed for wheezing or shortness of breath.      Marland Kitchen  calcium carbonate (TUMS EX) 750 MG chewable tablet Chew 2 tablets by mouth daily as needed for heartburn.      . carboxymethylcellulose (REFRESH PLUS) 0.5 % SOLN 2 drops as needed.      . carvedilol (COREG) 3.125 MG tablet Take 1 tablet (3.125 mg total) by mouth 2 (two) times daily.  180 tablet  3  . cetirizine (ZYRTEC) 10 MG tablet Take 10 mg by mouth daily.      . Cholecalciferol (VITAMIN D3) 1000 UNITS CAPS Take by mouth daily.        Marland Kitchen glucosamine-chondroitin 500-400 MG tablet Take 1 tablet by mouth 2 (two) times daily.       Marland Kitchen levothyroxine (SYNTHROID, LEVOTHROID) 25 MCG tablet Take 25 mcg by mouth daily before breakfast.      . lisinopril (PRINIVIL,ZESTRIL) 2.5 MG tablet Take 1 tablet (2.5 mg total) by mouth daily.  90 tablet  3  . meloxicam (MOBIC) 7.5 MG tablet Take 7.5 mg by mouth as needed.       . methocarbamol (ROBAXIN) 500 MG tablet Take 500 mg by mouth every 6 (six) hours as needed for muscle spasms.       .  NON FORMULARY Allergy injections       . OVER THE COUNTER MEDICATION Take 1 packet by mouth daily. QUALIFIBER POWDER -      . pantoprazole (PROTONIX) 40 MG tablet Take 1 tablet (40 mg total) by mouth daily.  30 tablet  3   No current facility-administered medications for this visit.    OBJECTIVE: Older white woman in no acute distress Filed Vitals:   10/24/13 1553  BP: 128/66  Pulse: 69  Temp: 97.8 F (36.6 C)  Resp: 18     Body mass index is 22.32 kg/(m^2).    ECOG FS:1 - Symptomatic but completely ambulatory  Ocular: Sclerae unicteric, pupils round and equal Ear-nose-throat: Oropharynx clear and moist Lymphatic: No cervical or supraclavicular adenopathy Lungs no rales or rhonchi, good excursion bilaterally Heart regular rate and rhythm, no murmur appreciated Abd soft, nontender, positive bowel sounds MSK no focal spinal tenderness, no joint edema Neuro: non-focal, well-oriented, positive affect Breasts: Deferred    LAB RESULTS: M spike is 0.87 g/dL, not significantly changed from 10 years ago. Total IgM is 992 (it was 1000 on 06/16/2005). Total IgG is 321. Total IgA is 18. Kappa/lambda ratio is normal at 1.61  CMP     Component Value Date/Time   NA 136 10/02/2013 1234   NA 139 09/25/2013 1321   K 5.3 10/02/2013 1234   K 4.7 09/25/2013 1321   CL 100 10/02/2013 1234   CO2 27 10/02/2013 1234   CO2 29 09/25/2013 1321   GLUCOSE 88 10/02/2013 1234   GLUCOSE 105 09/25/2013 1321   BUN 18 10/02/2013 1234   BUN 25.5 09/25/2013 1321   CREATININE 0.84 10/02/2013 1234   CREATININE 1.0 09/25/2013 1321   CREATININE 0.8 08/21/2013 1354   CALCIUM 9.4 10/02/2013 1234   CALCIUM 9.3 09/25/2013 1321   PROT 6.9 10/02/2013 1234   PROT 6.9 09/25/2013 1321   ALBUMIN 4.2 10/02/2013 1234   ALBUMIN 3.7 09/25/2013 1321   AST 22 10/02/2013 1234   AST 22 09/25/2013 1321   ALT 20 10/02/2013 1234   ALT 20 09/25/2013 1321   ALKPHOS 84 10/02/2013 1234   ALKPHOS 91 09/25/2013 1321   BILITOT 0.4 10/02/2013 1234   BILITOT 0.29  09/25/2013 1321   GFRNONAA 69 10/02/2013 1234   GFRNONAA 68.51  11/25/2009 1210   GFRAA 80 10/02/2013 1234   GFRAA  Value: >60        The eGFR has been calculated using the MDRD equation. This calculation has not been validated in all clinical situations. eGFR's persistently <60 mL/min signify possible Chronic Kidney Disease. 04/24/2008 1110    I No results found for this basename: SPEP, UPEP,  kappa and lambda light chains    Lab Results  Component Value Date   WBC 10.6* 10/02/2013   NEUTROABS 4.4 09/25/2013   HGB 14.4 10/02/2013   HCT 42.4 10/02/2013   MCV 88.7 10/02/2013   PLT 402* 10/02/2013      Chemistry      Component Value Date/Time   NA 136 10/02/2013 1234   NA 139 09/25/2013 1321   K 5.3 10/02/2013 1234   K 4.7 09/25/2013 1321   CL 100 10/02/2013 1234   CO2 27 10/02/2013 1234   CO2 29 09/25/2013 1321   BUN 18 10/02/2013 1234   BUN 25.5 09/25/2013 1321   CREATININE 0.84 10/02/2013 1234   CREATININE 1.0 09/25/2013 1321   CREATININE 0.8 08/21/2013 1354      Component Value Date/Time   CALCIUM 9.4 10/02/2013 1234   CALCIUM 9.3 09/25/2013 1321   ALKPHOS 84 10/02/2013 1234   ALKPHOS 91 09/25/2013 1321   AST 22 10/02/2013 1234   AST 22 09/25/2013 1321   ALT 20 10/02/2013 1234   ALT 20 09/25/2013 1321   BILITOT 0.4 10/02/2013 1234   BILITOT 0.29 09/25/2013 1321       No results found for this basename: LABCA2    No components found with this basename: LABCA125    No results found for this basename: INR,  in the last 168 hours  Urinalysis    Component Value Date/Time   COLORURINE LT. YELLOW 06/08/2011 0830   APPEARANCEUR CLEAR 06/08/2011 0830   LABSPEC 1.010 06/08/2011 0830   PHURINE 6.5 06/08/2011 0830   GLUCOSEU NEGATIVE 06/08/2011 0830   GLUCOSEU NEGATIVE 04/19/2008 1216   HGBUR NEGATIVE 06/08/2011 0830   BILIRUBINUR NEGATIVE 06/08/2011 0830   KETONESUR NEGATIVE 06/08/2011 0830   PROTEINUR NEGATIVE 04/19/2008 1216   UROBILINOGEN 0.2 06/08/2011 0830   NITRITE NEGATIVE 06/08/2011 0830   LEUKOCYTESUR NEGATIVE  06/08/2011 0830    STUDIES: Cardiac studies discussed above  ASSESSMENT: 73 y.o. 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 When it causes amyloidosis of the heart, which is quite rare, the pattern is restrictive. 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 In case of an infection, IVIG supplementation should be considered  PLAN: I spent approximately one hour with Kimberly Merritt today going over her recent history as well as her chronic IgM problem. I do not see an indication for repeat bone marrow biopsy at this point, but we'll resume followup of [name]'s IgM gammopathy, with labwork every 6 months and visits once a year. We do not generally give prophylactic IVIG in the absence of an active infection or a history of repeated infections. Nevertheless we will follow her IgG levels as well.  The patient has a good understanding of the overall plan. She agrees with it. She will call with any problems that may develop before her next visit here.  Chauncey Cruel, MD  10/27/2013 11:29 AM

## 2013-11-29 ENCOUNTER — Encounter: Payer: Self-pay | Admitting: Internal Medicine

## 2013-11-29 ENCOUNTER — Ambulatory Visit: Payer: Medicare Other | Admitting: Internal Medicine

## 2013-11-29 ENCOUNTER — Ambulatory Visit (INDEPENDENT_AMBULATORY_CARE_PROVIDER_SITE_OTHER): Payer: Medicare Other | Admitting: Internal Medicine

## 2013-11-29 VITALS — BP 128/80 | HR 65 | Temp 97.7°F | Wt 128.0 lb

## 2013-11-29 DIAGNOSIS — I428 Other cardiomyopathies: Secondary | ICD-10-CM | POA: Insufficient documentation

## 2013-11-29 DIAGNOSIS — I420 Dilated cardiomyopathy: Secondary | ICD-10-CM

## 2013-11-29 MED ORDER — LEVOTHYROXINE SODIUM 25 MCG PO TABS: 25.0000 ug | ORAL_TABLET | Freq: Every day | ORAL | Status: DC

## 2013-11-29 NOTE — Progress Notes (Signed)
Subjective:     HPI  C/o LBP, R hip pain - seeing Dr Latanya Maudlin. C/o fatigue: feeling ok 2 hrs/d in am. F/u CHF: The patient presents for follow up of cardiomyopathy. She has been noted to have an ejection fraction of 30%. This was brought to attention after she was found to have frequent premature ventricular contractions. Cardiac catheterization demonstrated nonobstructive and small vessel disease.   F/u flushing and itching when she took a high dose of hydrocodone. F/u allergies  The patient is here to follow up on chronic allergies depression, anxiety, headaches, osteopenia and chronic moderate fibromyalgia and OA symptoms controlled partially with medicines, diet and exercise.  BP Readings from Last 3 Encounters:  11/29/13 128/80  10/25/13 122/68  10/24/13 128/66   Wt Readings from Last 3 Encounters:  11/29/13 128 lb (58.06 kg)  10/25/13 129 lb 11.2 oz (58.832 kg)  10/24/13 130 lb 1.6 oz (59.013 kg)    Review of Systems  Constitutional: Positive for fatigue. Negative for chills, activity change, appetite change and unexpected weight change.  HENT: Negative for congestion, mouth sores and sinus pressure.   Eyes: Negative for visual disturbance.  Respiratory: Negative for cough and chest tightness.   Gastrointestinal: Negative for nausea and abdominal pain.  Genitourinary: Negative for frequency, difficulty urinating and vaginal pain.  Musculoskeletal: Positive for arthralgias and myalgias. Negative for back pain and gait problem.  Skin: Negative for pallor and rash.  Neurological: Negative for dizziness, tremors, weakness, numbness and headaches.  Psychiatric/Behavioral: Negative for confusion and sleep disturbance. The patient is nervous/anxious.        Objective:   Physical Exam  Constitutional: She appears well-developed and well-nourished. No distress.  HENT:  Head: Normocephalic.  Right Ear: External ear normal.  Left Ear: External ear normal.  Nose: Nose normal.   Mouth/Throat: Oropharynx is clear and moist.  Eyes: Conjunctivae are normal. Pupils are equal, round, and reactive to light. Right eye exhibits no discharge. Left eye exhibits no discharge.  Neck: Normal range of motion. Neck supple. No JVD present. No tracheal deviation present. No thyromegaly present.  Cardiovascular: Normal rate, regular rhythm and normal heart sounds.   Pulmonary/Chest: No stridor. No respiratory distress. She has no wheezes.  Abdominal: Soft. Bowel sounds are normal. She exhibits no distension and no mass. There is no tenderness. There is no rebound and no guarding.  Musculoskeletal: She exhibits edema (trace) and tenderness.  Lymphadenopathy:    She has no cervical adenopathy.  Neurological: She displays normal reflexes. No cranial nerve deficit. She exhibits normal muscle tone. Coordination normal.  Skin: No rash noted. No erythema.  Spider veins on B LEs  Psychiatric: She has a normal mood and affect. Her behavior is normal. Judgment and thought content normal.  5 mm lower lip cyst on R OA changes in hands  Lab Results  Component Value Date   WBC 10.6* 10/02/2013   HGB 14.4 10/02/2013   HCT 42.4 10/02/2013   PLT 402* 10/02/2013   GLUCOSE 88 10/02/2013   CHOL 171 06/08/2011   TRIG 32.0 06/08/2011   HDL 70.90 06/08/2011   LDLDIRECT 131.0 02/05/2008   LDLCALC 94 06/08/2011   ALT 20 10/02/2013   AST 22 10/02/2013   NA 136 10/02/2013   K 5.3 10/02/2013   CL 100 10/02/2013   CREATININE 0.84 10/02/2013   BUN 18 10/02/2013   CO2 27 10/02/2013   TSH 1.06 09/15/2012   INR 1.03 10/02/2013  Assessment & Plan:

## 2013-11-29 NOTE — Assessment & Plan Note (Signed)
58 Dr Percival Spanish The patient presents for follow up of cardiomyopathy. She has been noted to have an ejection fraction of 30%. This was brought to attention after she was found to have frequent premature ventricular contractions. Cardiac catheterization demonstrated nonobstructive and small vessel disease. Continue with current prescription therapy as reflected on the Med list.  Pt had a flu shot elsewhere

## 2013-11-29 NOTE — Progress Notes (Signed)
Pre visit review using our clinic review tool, if applicable. No additional management support is needed unless otherwise documented below in the visit note. 

## 2013-12-05 ENCOUNTER — Encounter: Payer: Self-pay | Admitting: Cardiology

## 2013-12-05 ENCOUNTER — Ambulatory Visit (INDEPENDENT_AMBULATORY_CARE_PROVIDER_SITE_OTHER): Payer: Medicare Other | Admitting: Cardiology

## 2013-12-05 VITALS — BP 122/60 | HR 91 | Ht 64.0 in | Wt 130.0 lb

## 2013-12-05 DIAGNOSIS — I420 Dilated cardiomyopathy: Secondary | ICD-10-CM

## 2013-12-05 MED ORDER — CARVEDILOL 6.25 MG PO TABS: 6.2500 mg | ORAL_TABLET | Freq: Two times a day (BID) | ORAL | Status: DC

## 2013-12-05 NOTE — Progress Notes (Signed)
HPI The patient presents for follow up of cardiomyopathy. She has been noted to have an ejection fraction of 30%. This was brought to attention after she was found to have frequent premature ventricular contractions. Cardiac catheterization demonstrated nonobstructive and small vessel disease.  At the last visit because of lower blood pressures I did not titrate her medications. However, her blood pressure is that I reviewed today and her diary have been pretty much in the 409 systolic range. She's had some rare highs and only one low blood pressure.  She's not had any presyncope or syncope. She does notice that her heart is irregular occasionally. She's had no new chest pressure, neck or arm discomfort. She's had no new shortness of breath, PND or orthopnea.  Allergies  Allergen Reactions  . Acetaminophen     REACTION: high dose - tremor  . Alendronate Sodium     Stomach upset.   . Contrast Media [Iodinated Diagnostic Agents]     Syncope.   . Diflunisal     Stomach pain.    . Doxycycline     n/v  . Flexeril [Cyclobenzaprine]     Abdominal pain.    . Nabumetone     Abdominal pain; dark, tarry stool.    . Oxybutynin     Pt does not remember.    . Raloxifene     Unknown.    . Sulfa Antibiotics Nausea Only    Syncope.    . Sulfonamide Derivatives   . Symbicort [Budesonide-Formoterol Fumarate]     "Jittery-ness."   . Tramadol Hcl     Numbness.    . Diclofenac Palpitations    Dark, tarry stool.      Current Outpatient Prescriptions  Medication Sig Dispense Refill  . albuterol (PROVENTIL HFA;VENTOLIN HFA) 108 (90 BASE) MCG/ACT inhaler Inhale 1-2 puffs into the lungs every 4 (four) hours as needed for wheezing or shortness of breath.    . calcium carbonate (TUMS EX) 750 MG chewable tablet Chew 2 tablets by mouth daily as needed for heartburn.    . carboxymethylcellulose (REFRESH PLUS) 0.5 % SOLN 2 drops as needed.    . carvedilol (COREG) 3.125 MG tablet Take 1 tablet (3.125 mg  total) by mouth 2 (two) times daily. 180 tablet 3  . cetirizine (ZYRTEC) 10 MG tablet Take 10 mg by mouth daily.    . Cholecalciferol (VITAMIN D3) 1000 UNITS CAPS Take by mouth daily.      Marland Kitchen glucosamine-chondroitin 500-400 MG tablet Take 1 tablet by mouth 2 (two) times daily.     Marland Kitchen levothyroxine (SYNTHROID, LEVOTHROID) 25 MCG tablet Take 1 tablet (25 mcg total) by mouth daily before breakfast. 30 tablet 11  . lisinopril (PRINIVIL,ZESTRIL) 2.5 MG tablet Take 1 tablet (2.5 mg total) by mouth daily. 90 tablet 3  . NON FORMULARY Allergy injections     . OVER THE COUNTER MEDICATION Take 1 packet by mouth daily. QUALIFIBER POWDER -    . pantoprazole (PROTONIX) 40 MG tablet Take 1 tablet (40 mg total) by mouth daily. 30 tablet 3  . meloxicam (MOBIC) 7.5 MG tablet Take 7.5 mg by mouth as needed.     . methocarbamol (ROBAXIN) 500 MG tablet Take 500 mg by mouth every 6 (six) hours as needed for muscle spasms.      No current facility-administered medications for this visit.    Past Medical History  Diagnosis Date  . HYPOTHYROIDISM 08/29/2006  . 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 GERD 08/29/2006  .  HEPATIC CYST 08/02/2007  . OSTEOARTHRITIS 08/29/2006  . LOW BACK PAIN 08/29/2006  . OSTEOPENIA 11/19/2008  . PARESTHESIA 03/05/2009  . LIVER HEMANGIOMA 03/03/2010  . 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 MUSCLE SPASM 03/03/2010  . DEPRESSION 08/29/2006  . Diverticulosis   . Scoliosis   . CAD (coronary artery disease)     Nonobstructive. LAD 30% stenosis, small PDA of the circumflex 70% stenosis, right coronary artery 40% stenosis. Catheterization September 2015  . Nonischemic cardiomyopathy     EF 30%  . PVC's (premature ventricular contractions)     Past Surgical History  Procedure Laterality Date  . Tonsillectomy    . Abdominal hysterectomy    . Appendectomy    . Carpal tunnel release      both  . Bunionectomy      both  . Back surgery  2010,2006    lumb fusion  . Cataract extraction       both  . Shoulder arthroscopy  02/09/2011    Procedure: ARTHROSCOPY SHOULDER;  Surgeon: Hessie Dibble, MD;  Location: [facility];  Service: Orthopedics;  Laterality: Right;  right shoulder arthroscopy subacromial decompression with rotator cuff repair    ROS:  As stated in the HPI and negative for all other systems.   PHYSICAL EXAM BP 122/60 mmHg  Pulse 91  Ht 5\' 4"  (1.626 m)  Wt 130 lb (58.968 kg)  BMI 22.30 kg/m2 GENERAL:  Well appearing NECK:  No jugular venous distention, waveform within normal limits, carotid upstroke brisk and symmetric, no bruits, no thyromegaly LUNGS:  Clear to auscultation bilaterally BACK:  No CVA tenderness CHEST:  Unremarkable HEART:  PMI not displaced or sustained,S1 and S2 within normal limits, no S3, positive S4, no clicks, no rubs, no murmurs ABD:  Flat, positive bowel sounds normal in frequency in pitch, no bruits, no rebound, no guarding, no midline pulsatile mass, no hepatomegaly, no splenomegaly EXT:  2 plus pulses throughout, no edema, no cyanosis no clubbing  EKG:  Sinus rhythm, rate 91, axis within normal limits, intervals within normal limits, nonspecific unchanged lateral T-wave changes, frequent ectopy. 12/05/2013  ASSESSMENT AND PLAN  CARDIOMYPATHY:  Today I will titrate the carvedilol to 6.25 mg twice daily.  HTN: This is being managed in the context of treating his CHF  PVCs:  She was having frequent ventricular ectopy. These were confirmed a Holter. In pursuing titration of her medications. Her EF stays low we will need to consider an ICD.

## 2013-12-05 NOTE — Patient Instructions (Signed)
Increase Carvedilol 3.125 mg take 2 tablets twice a day    Your physician recommends that you schedule a follow-up appointment in: 1 month.  Friday 01/11/14 at 2:15 pm.

## 2013-12-07 ENCOUNTER — Ambulatory Visit: Payer: Medicare Other | Admitting: Cardiology

## 2014-01-10 ENCOUNTER — Encounter (HOSPITAL_COMMUNITY): Payer: Self-pay | Admitting: Cardiology

## 2014-01-11 ENCOUNTER — Ambulatory Visit: Payer: Medicare Other | Admitting: Cardiology

## 2014-01-14 ENCOUNTER — Ambulatory Visit (INDEPENDENT_AMBULATORY_CARE_PROVIDER_SITE_OTHER): Payer: Medicare Other | Admitting: Cardiology

## 2014-01-14 ENCOUNTER — Telehealth: Payer: Self-pay | Admitting: Cardiology

## 2014-01-14 ENCOUNTER — Encounter: Payer: Self-pay | Admitting: Cardiology

## 2014-01-14 VITALS — BP 128/78 | HR 66 | Ht 64.0 in | Wt 129.4 lb

## 2014-01-14 DIAGNOSIS — I420 Dilated cardiomyopathy: Secondary | ICD-10-CM

## 2014-01-14 MED ORDER — LISINOPRIL 2.5 MG PO TABS: 2.5000 mg | ORAL_TABLET | Freq: Two times a day (BID) | ORAL | Status: DC

## 2014-01-14 NOTE — Patient Instructions (Signed)
Start taking lisinopril 2.5 mg two times a day  Your physician recommends that you schedule a follow-up appointment in: one month with Dr. Percival Spanish

## 2014-01-14 NOTE — Progress Notes (Signed)
HPI The patient presents for follow up of cardiomyopathy. She has been noted to have an ejection fraction of 30%. This was brought to attention after she was found to have frequent premature ventricular contractions. Cardiac catheterization demonstrated nonobstructive and small vessel disease.  At the last visit I increased her beta blocker and she did OK with this.  She is fighting a cold today.  The patient denies any new symptoms such as chest discomfort, neck or arm discomfort. There has been no new shortness of breath, PND or orthopnea. There has been no presyncope or syncope.  She does have occasional palpitations.    Allergies  Allergen Reactions  . Acetaminophen     REACTION: high dose - tremor  . Alendronate Sodium     Stomach upset.   . Contrast Media [Iodinated Diagnostic Agents]     Syncope.   . Diflunisal     Stomach pain.    . Doxycycline     n/v  . Flexeril [Cyclobenzaprine]     Abdominal pain.    . Nabumetone     Abdominal pain; dark, tarry stool.    . Oxybutynin     Pt does not remember.    . Raloxifene     Unknown.    . Sulfa Antibiotics Nausea Only    Syncope.    . Sulfonamide Derivatives   . Symbicort [Budesonide-Formoterol Fumarate]     "Jittery-ness."   . Tramadol Hcl     Numbness.    . Diclofenac Palpitations    Dark, tarry stool.      Current Outpatient Prescriptions  Medication Sig Dispense Refill  . albuterol (PROVENTIL HFA;VENTOLIN HFA) 108 (90 BASE) MCG/ACT inhaler Inhale 1-2 puffs into the lungs every 4 (four) hours as needed for wheezing or shortness of breath.    . calcium carbonate (TUMS EX) 750 MG chewable tablet Chew 2 tablets by mouth daily as needed for heartburn.    . carboxymethylcellulose (REFRESH PLUS) 0.5 % SOLN 2 drops as needed.    . carvedilol (COREG) 6.25 MG tablet Take 1 tablet (6.25 mg total) by mouth 2 (two) times daily. 180 tablet 3  . cetirizine (ZYRTEC) 10 MG tablet Take 10 mg by mouth daily.    . Cholecalciferol  (VITAMIN D3) 1000 UNITS CAPS Take by mouth daily.      . CODEINE PHOSPHATE PO Take 10 mg by mouth as needed.    Marland Kitchen glucosamine-chondroitin 500-400 MG tablet Take 1 tablet by mouth 2 (two) times daily.     Marland Kitchen guaiFENesin (ROBITUSSIN) 100 MG/5ML liquid Take 200 mg by mouth 3 (three) times daily as needed for cough.    . levothyroxine (SYNTHROID, LEVOTHROID) 25 MCG tablet Take 1 tablet (25 mcg total) by mouth daily before breakfast. 30 tablet 11  . lisinopril (PRINIVIL,ZESTRIL) 2.5 MG tablet Take 1 tablet (2.5 mg total) by mouth daily. 90 tablet 3  . meloxicam (MOBIC) 7.5 MG tablet Take 7.5 mg by mouth as needed.     . methocarbamol (ROBAXIN) 500 MG tablet Take 500 mg by mouth every 6 (six) hours as needed for muscle spasms.     . NON FORMULARY Allergy injections     . OVER THE COUNTER MEDICATION Take 1 packet by mouth daily. QUALIFIBER POWDER -    . pantoprazole (PROTONIX) 40 MG tablet Take 1 tablet (40 mg total) by mouth daily. 30 tablet 3   No current facility-administered medications for this visit.    Past Medical History  Diagnosis Date  .  HYPOTHYROIDISM 08/29/2006  . 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 GERD 08/29/2006  . HEPATIC CYST 08/02/2007  . OSTEOARTHRITIS 08/29/2006  . LOW BACK PAIN 08/29/2006  . OSTEOPENIA 11/19/2008  . PARESTHESIA 03/05/2009  . LIVER HEMANGIOMA 03/03/2010  . 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 MUSCLE SPASM 03/03/2010  . DEPRESSION 08/29/2006  . Diverticulosis   . Scoliosis   . CAD (coronary artery disease)     Nonobstructive. LAD 30% stenosis, small PDA of the circumflex 70% stenosis, right coronary artery 40% stenosis. Catheterization September 2015  . Nonischemic cardiomyopathy     EF 30%  . PVC's (premature ventricular contractions)     Past Surgical History  Procedure Laterality Date  . Tonsillectomy    . Abdominal hysterectomy    . Appendectomy    . Carpal tunnel release      both  . Bunionectomy      both  . Back surgery  2010,2006     lumb fusion  . Cataract extraction      both  . Shoulder arthroscopy  02/09/2011    Procedure: ARTHROSCOPY SHOULDER;  Surgeon: Hessie Dibble, MD;  Location: [place];  Service: Orthopedics;  Laterality: Right;  right shoulder arthroscopy subacromial decompression with rotator cuff repair  . Left and right heart catheterization with coronary angiogram N/A 10/04/2013    Procedure: LEFT AND RIGHT HEART CATHETERIZATION WITH CORONARY ANGIOGRAM;  Surgeon: Leonie Man, MD;  Location: [facility] CATH LAB;  Service: Cardiovascular;  Laterality: N/A;    ROS:  As stated in the HPI and negative for all other systems.   PHYSICAL EXAM BP 128/78 mmHg  Pulse 66  Ht 5\' 4"  (1.626 m)  Wt 129 lb 6.4 oz (58.695 kg)  BMI 22.20 kg/m2 GENERAL:  Well appearing NECK:  No jugular venous distention, waveform within normal limits, carotid upstroke brisk and symmetric, no bruits, no thyromegaly LUNGS:  Clear to auscultation bilaterally BACK:  No CVA tenderness CHEST:  Unremarkable HEART:  PMI not displaced or sustained,S1 and S2 within normal limits, no S3, positive S4, no clicks, no rubs, no murmurs ABD:  Flat, positive bowel sounds normal in frequency in pitch, no bruits, no rebound, no guarding, no midline pulsatile mass, no hepatomegaly, no splenomegaly EXT:  2 plus pulses throughout, no edema, no cyanosis no clubbing   ASSESSMENT AND PLAN  CARDIOMYPATHY:  Today I will titrate the lisinopril to 2.5 mg twice daily.  HTN: This is being managed in the context of treating her CHF  PVCs:  She was having frequent ventricular ectopy. These were confirmed a Holter. I am pursuing titration of her medications. If her EF stays low we will need to consider an ICD.

## 2014-01-14 NOTE — Telephone Encounter (Signed)
Calling to verify dosage of prescription that was sent in this afternoon

## 2014-01-14 NOTE — Telephone Encounter (Signed)
Script refilled

## 2014-02-15 ENCOUNTER — Ambulatory Visit (INDEPENDENT_AMBULATORY_CARE_PROVIDER_SITE_OTHER): Payer: Medicare Other | Admitting: Cardiology

## 2014-02-15 ENCOUNTER — Encounter: Payer: Self-pay | Admitting: Cardiology

## 2014-02-15 VITALS — BP 114/72 | HR 58 | Ht 64.0 in | Wt 127.6 lb

## 2014-02-15 DIAGNOSIS — I420 Dilated cardiomyopathy: Secondary | ICD-10-CM

## 2014-02-15 MED ORDER — CARVEDILOL 3.125 MG PO TABS: 3.1250 mg | ORAL_TABLET | Freq: Two times a day (BID) | ORAL | Status: DC
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
## 2014-02-15 NOTE — Patient Instructions (Signed)
Your physician recommends that you schedule a follow-up appointment in: 6 weeks with Dr. Percival Spanish  We are adding another 3.125 mg of carvedilol to your now dose of 6.25 mg  Now you will be talking 9.375 mg two times a day

## 2014-03-25 ENCOUNTER — Encounter: Payer: Self-pay | Admitting: Cardiology

## 2014-03-25 ENCOUNTER — Ambulatory Visit (INDEPENDENT_AMBULATORY_CARE_PROVIDER_SITE_OTHER): Payer: Medicare Other | Admitting: Cardiology

## 2014-03-25 VITALS — BP 112/64 | HR 60 | Ht 61.0 in | Wt 129.0 lb

## 2014-03-25 DIAGNOSIS — I420 Dilated cardiomyopathy: Secondary | ICD-10-CM

## 2014-03-25 NOTE — Patient Instructions (Signed)
Your physician recommends that you schedule a follow-up appointment in: 4 months with Dr. Percival Spanish  We have ordered an echo for you to get done

## 2014-03-25 NOTE — Progress Notes (Signed)
HPI The patient presents for follow up of cardiomyopathy. She has been noted to have an ejection fraction of 30%. This was brought to attention after she was found to have frequent premature ventricular contractions. Cardiac catheterization demonstrated nonobstructive and small vessel disease.  At the last visit I increased her beta blocer.    She did OK with this.  She has been sensitive to medications. She was lightheaded for about a week with this med change. She does still have some lower blood pressures. However, she's tolerating it. She occasionally feels palpitations. She is not having any presyncope or syncope. She is not having any chest pressure, neck or arm discomfort. He's had no weight gain or edema. She's had no new shortness of breath.  Allergies  Allergen Reactions  . Acetaminophen     REACTION: high dose - tremor  . Alendronate Sodium     Stomach upset.   . Contrast Media [Iodinated Diagnostic Agents]     Syncope.   . Diflunisal     Stomach pain.    . Doxycycline     n/v  . Flexeril [Cyclobenzaprine]     Abdominal pain.    . Nabumetone     Abdominal pain; dark, tarry stool.    . Oxybutynin     Pt does not remember.    . Raloxifene     Unknown.    . Sulfa Antibiotics Nausea Only    Syncope.    . Sulfonamide Derivatives   . Symbicort [Budesonide-Formoterol Fumarate]     "Jittery-ness."   . Tramadol Hcl     Numbness.    . Diclofenac Palpitations    Dark, tarry stool.      Current Outpatient Prescriptions  Medication Sig Dispense Refill  . calcium carbonate (TUMS EX) 750 MG chewable tablet Chew 2 tablets by mouth daily as needed for heartburn.    . carboxymethylcellulose (REFRESH PLUS) 0.5 % SOLN Place 2 drops into both eyes 4 (four) times daily.     . carvedilol (COREG) 3.125 MG tablet Take 1 tablet (3.125 mg total) by mouth 2 (two) times daily. 180 tablet 3  . carvedilol (COREG) 6.25 MG tablet Take 1 tablet (6.25 mg total) by mouth 2 (two) times daily. 180  tablet 3  . Cholecalciferol (VITAMIN D3) 1000 UNITS CAPS Take by mouth daily.      . fexofenadine (ALLEGRA) 180 MG tablet Take 180 mg by mouth daily as needed for allergies or rhinitis.    Marland Kitchen glucosamine-chondroitin 500-400 MG tablet Take 1 tablet by mouth 2 (two) times daily.     Marland Kitchen levothyroxine (SYNTHROID, LEVOTHROID) 25 MCG tablet Take 1 tablet (25 mcg total) by mouth daily before breakfast. 30 tablet 11  . lisinopril (PRINIVIL,ZESTRIL) 2.5 MG tablet Take 1 tablet (2.5 mg total) by mouth 2 (two) times daily. 90 tablet 3  . NON FORMULARY Allergy injections     . pantoprazole (PROTONIX) 20 MG tablet Take 20 mg by mouth daily.     No current facility-administered medications for this visit.    Past Medical History  Diagnosis Date  . HYPOTHYROIDISM 08/29/2006  . 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 GERD 08/29/2006  . HEPATIC CYST 08/02/2007  . OSTEOARTHRITIS 08/29/2006  . LOW BACK PAIN 08/29/2006  . OSTEOPENIA 11/19/2008  . PARESTHESIA 03/05/2009  . LIVER HEMANGIOMA 03/03/2010  . 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 MUSCLE SPASM 03/03/2010  . DEPRESSION 08/29/2006  . Diverticulosis   . Scoliosis   . CAD (coronary artery  disease)     Nonobstructive. LAD 30% stenosis, small PDA of the circumflex 70% stenosis, right coronary artery 40% stenosis. Catheterization September 2015  . Nonischemic cardiomyopathy     EF 30%  . PVC's (premature ventricular contractions)     Past Surgical History  Procedure Laterality Date  . Tonsillectomy    . Abdominal hysterectomy    . Appendectomy    . Carpal tunnel release      both  . Bunionectomy      both  . Back surgery  2010,2006    lumb fusion  . Cataract extraction      both  . Shoulder arthroscopy  02/09/2011    Procedure: ARTHROSCOPY SHOULDER;  Surgeon: Hessie Dibble, MD;  Location: [place];  Service: Orthopedics;  Laterality: Right;  right shoulder arthroscopy subacromial decompression with rotator cuff repair  . Left  and right heart catheterization with coronary angiogram N/A 10/04/2013    Procedure: LEFT AND RIGHT HEART CATHETERIZATION WITH CORONARY ANGIOGRAM;  Surgeon: Leonie Man, MD;  Location: [facility] CATH LAB;  Service: Cardiovascular;  Laterality: N/A;    ROS:  Mild cough and congestion.  Otherwise as stated in the HPI and negative for all other systems.   PHYSICAL EXAM BP 112/64 mmHg  Pulse 60  Ht 5\' 1"  (1.549 m)  Wt 129 lb (58.514 kg)  BMI 24.39 kg/m2 GENERAL:  Well appearing NECK:  No jugular venous distention, waveform within normal limits, carotid upstroke brisk and symmetric, no bruits, no thyromegaly LUNGS:  Clear to auscultation bilaterally BACK:  No CVA tenderness CHEST:  Unremarkable HEART:  PMI not displaced or sustained,S1 and S2 within normal limits, no S3, positive S4, no clicks, no rubs, no murmurs ABD:  Flat, positive bowel sounds normal in frequency in pitch, no bruits, no rebound, no guarding, no midline pulsatile mass, no hepatomegaly, no splenomegaly EXT:  2 plus pulses throughout, no edema, no cyanosis no clubbing   ASSESSMENT AND PLAN  CARDIOMYPATHY:  Today I cannot titrate her meds because her blood pressure and heart rate will not allow it. She is already mildly lightheaded with the meds as listed. I will follow up an echocardiogram to further assess her ejection fraction. If it is not improved we might need to consider an ICD.  HTN: This is being managed in the context of treating her CHF  PVCs:  She was having frequent ventricular ectopy. These were confirmed a Holter. I am pursuing titration of her medications. If her EF stays low we will need to consider an ICD.

## 2014-03-29 ENCOUNTER — Ambulatory Visit: Payer: Medicare Other | Admitting: Cardiology

## 2014-04-04 ENCOUNTER — Encounter: Payer: Self-pay | Admitting: Cardiology

## 2014-04-09 ENCOUNTER — Ambulatory Visit (HOSPITAL_COMMUNITY)
Admission: RE | Admit: 2014-04-09 | Discharge: 2014-04-09 | Disposition: A | Discharge status: Home / Self Care | Payer: Medicare Other | Source: Ambulatory Visit | Attending: Cardiology | Admitting: Cardiology

## 2014-04-09 DIAGNOSIS — I420 Dilated cardiomyopathy: Secondary | ICD-10-CM | POA: Diagnosis present

## 2014-04-09 NOTE — Progress Notes (Signed)
2D Echo Performed 04/09/2014    Zissy Hamlett, RCS  

## 2014-04-24 ENCOUNTER — Other Ambulatory Visit (HOSPITAL_BASED_OUTPATIENT_CLINIC_OR_DEPARTMENT_OTHER): Payer: Medicare Other

## 2014-04-24 DIAGNOSIS — D472 Monoclonal gammopathy: Secondary | ICD-10-CM

## 2014-04-24 DIAGNOSIS — D801 Nonfamilial hypogammaglobulinemia: Secondary | ICD-10-CM | POA: Diagnosis not present

## 2014-04-24 LAB — COMPREHENSIVE METABOLIC PANEL (CC13)
ALT: 20 U/L (ref 0–55)
AST: 18 U/L (ref 5–34)
Albumin: 3.7 g/dL (ref 3.5–5.0)
Alkaline Phosphatase: 81 U/L (ref 40–150)
Anion Gap: 10 mEq/L (ref 3–11)
BUN: 14.8 mg/dL (ref 7.0–26.0)
CO2: 27 mEq/L (ref 22–29)
Calcium: 9 mg/dL (ref 8.4–10.4)
Chloride: 102 mEq/L (ref 98–109)
Creatinine: 0.8 mg/dL (ref 0.6–1.1)
EGFR: 74 mL/min/{1.73_m2} — ABNORMAL LOW (ref >90)
Glucose: 69 mg/dl — ABNORMAL LOW (ref 70–140)
Potassium: 4.9 mEq/L (ref 3.5–5.1)
Sodium: 139 mEq/L (ref 136–145)
Total Bilirubin: 0.57 mg/dL (ref 0.20–1.20)
Total Protein: 6.8 g/dL (ref 6.4–8.3)

## 2014-04-24 LAB — CBC WITH DIFFERENTIAL/PLATELET
BASO%: 1.1 % (ref 0.0–2.0)
Basophils Absolute: 0.1 10*3/uL (ref 0.0–0.1)
EOS%: 1.6 % (ref 0.0–7.0)
Eosinophils Absolute: 0.1 10*3/uL (ref 0.0–0.5)
HCT: 40.4 % (ref 34.8–46.6)
HGB: 13.1 g/dL (ref 11.6–15.9)
LYMPH%: 35.1 % (ref 14.0–49.7)
MCH: 30.1 pg (ref 25.1–34.0)
MCHC: 32.6 g/dL (ref 31.5–36.0)
MCV: 92.5 fL (ref 79.5–101.0)
MONO#: 1.1 10*3/uL — ABNORMAL HIGH (ref 0.1–0.9)
MONO%: 15.2 % — ABNORMAL HIGH (ref 0.0–14.0)
NEUT#: 3.5 10*3/uL (ref 1.5–6.5)
NEUT%: 47 % (ref 38.4–76.8)
Platelets: 335 10*3/uL (ref 145–400)
RBC: 4.36 10*6/uL (ref 3.70–5.45)
RDW: 12.7 % (ref 11.2–14.5)
WBC: 7.5 10*3/uL (ref 3.9–10.3)
lymph#: 2.7 10*3/uL (ref 0.9–3.3)

## 2014-04-24 LAB — LACTATE DEHYDROGENASE (CC13): LDH: 144 U/L (ref 125–245)

## 2014-04-26 ENCOUNTER — Other Ambulatory Visit: Payer: Self-pay | Admitting: Oncology

## 2014-04-26 LAB — SPEP & IFE WITH QIG
Abnormal Protein Band1: 0.8 g/dL
Albumin ELP: 3.7 g/dL — ABNORMAL LOW (ref 3.8–4.8)
Alpha-1-Globulin: 0.3 g/dL (ref 0.2–0.3)
Alpha-2-Globulin: 0.7 g/dL (ref 0.5–0.9)
Beta 2: 0.2 g/dL (ref 0.2–0.5)
Beta Globulin: 1.2 g/dL — ABNORMAL HIGH (ref 0.4–0.6)
Gamma Globulin: 0.4 g/dL — ABNORMAL LOW (ref 0.8–1.7)
IgA: 17 mg/dL — ABNORMAL LOW (ref 69–380)
IgG (Immunoglobin G), Serum: 347 mg/dL — ABNORMAL LOW (ref 690–1700)
IgM, Serum: 1060 mg/dL — ABNORMAL HIGH (ref 52–322)
Total Protein, Serum Electrophoresis: 6.5 g/dL (ref 6.1–8.1)

## 2014-04-26 LAB — KAPPA/LAMBDA LIGHT CHAINS
Kappa free light chain: 1.25 mg/dL (ref 0.33–1.94)
Kappa:Lambda Ratio: 6.58 — ABNORMAL HIGH (ref 0.26–1.65)
Lambda Free Lght Chn: 0.19 mg/dL — ABNORMAL LOW (ref 0.57–2.63)

## 2014-04-29 ENCOUNTER — Other Ambulatory Visit: Payer: Self-pay | Admitting: Oncology

## 2014-04-30 ENCOUNTER — Telehealth: Payer: Self-pay | Admitting: Oncology

## 2014-04-30 NOTE — Telephone Encounter (Signed)
spoke with patient and advised on June lab add on....pt was not happy that she had not recieved any results from the labs she just had and wanted to know why she need another lab in 15mths....transfered pt to desk nurse...pt did not want to sched

## 2014-05-01 ENCOUNTER — Telehealth: Payer: Self-pay | Admitting: Oncology

## 2014-05-01 ENCOUNTER — Other Ambulatory Visit: Payer: Self-pay | Admitting: *Deleted

## 2014-05-01 NOTE — Progress Notes (Signed)
Per Dr. Jana Hakim, please schedule a lab appointment for June. This RN called and spoke with patient informing her off her kappa light chain levels. Patient verbalized understanding. POF sent to scheduling.

## 2014-05-01 NOTE — Telephone Encounter (Signed)
spoke with patient and advised on June added lab .Marland Kitchen..pt ok and aware

## 2014-05-10 ENCOUNTER — Telehealth: Payer: Self-pay | Admitting: Oncology

## 2014-05-10 NOTE — Telephone Encounter (Signed)
Due to PAL moved 9/27 f/u to 10/11. Spoke with patient she is aware. Lab appointments for 6/15 and 9/20 remain the same.

## 2014-05-17 ENCOUNTER — Telehealth: Payer: Self-pay | Admitting: Cardiology

## 2014-05-17 NOTE — Telephone Encounter (Signed)
Pt recently seen by allergist, Dr. Mosetta Anis. States seen for cough & congestion. Was eventually referred to PCP & put on Clindamycin 10 day course. Problems have been addressed by her PCP. She denies dyspnea, CP, other symptoms. There was no recommended cardiology f/u for this problem.  Pt states she did not feel comfortable getting epi pen rx from allergist d/t concern over med interactions/adverse effects. She also states Dr. Donneta Romberg had requested records from our office regarding her heart failure, etc. Will defer to medical records   Advised pt Dr. Rush [facility] office [phone]) may need to call us or fax request for information and this may have already been done. She voiced understanding.

## 2014-05-17 NOTE — Telephone Encounter (Signed)
Please call,pt says she wants to talk to a nurse. She said it was a lot of details.

## 2014-05-27 ENCOUNTER — Telehealth: Payer: Self-pay | Admitting: Cardiology

## 2014-05-27 NOTE — Telephone Encounter (Signed)
Pt said she spoke to [place] on 05-17-14. He was suppose to give some information to her allergist.They still have not received it.

## 2014-05-27 NOTE — Telephone Encounter (Signed)
Received call from Dr. Rush Landmark nurse. Confirmed that they want patient to have epipen on-hand in order to get allergy shots, but she would need OK from cardiologist for this.

## 2014-05-27 NOTE — Telephone Encounter (Signed)
Called back to Dr. Rush Landmark office, left message for nurse to call. Not sure what information they are needing specifically. Did advise receptionist there that their nurse probably should speak to medical records here.  Called pt. She is wanting to get allergy shots, wants to make sure no contraindications. Has had epipen in the past, wanted to know if OK to have on-hand.  Deferring to Dr. Percival Spanish

## 2014-05-30 NOTE — Telephone Encounter (Signed)
No absolute contraindication to using an epi pen.  This will likely contribute to increased ectopy.

## 2014-06-04 NOTE — Telephone Encounter (Signed)
Pt. Informed of Dr. Rosezella [place] instructions about use of the EPI pen

## 2014-07-15 ENCOUNTER — Encounter: Payer: Self-pay | Admitting: Cardiology

## 2014-07-15 ENCOUNTER — Ambulatory Visit (INDEPENDENT_AMBULATORY_CARE_PROVIDER_SITE_OTHER): Payer: Medicare Other | Admitting: Cardiology

## 2014-07-15 VITALS — BP 108/62 | HR 62

## 2014-07-15 DIAGNOSIS — I420 Dilated cardiomyopathy: Secondary | ICD-10-CM

## 2014-07-15 NOTE — Progress Notes (Signed)
HPI The patient presents for follow up of cardiomyopathy. She has been noted to have an ejection fraction of 30%. This was brought to attention after she was found to have frequent premature ventricular contractions. Cardiac catheterization demonstrated nonobstructive and small vessel disease.  After the last visit she had an echo and her EF was improved to 45%.  We have not been able to titrate meds as her BP has been low.  She has rare dizziness.  She has episodes rarely of her BP dropping into the 99 systolic range.  She denies any presyncope or syncope. She is not having any chest pressure, neck or arm discomfort. He's had no weight gain or edema. She's had no new shortness of breath.  Allergies  Allergen Reactions  . Acetaminophen     REACTION: high dose - tremor  . Alendronate Sodium     Stomach upset.   . Contrast Media [Iodinated Diagnostic Agents]     Syncope.   . Diflunisal     Stomach pain.    . Doxycycline     n/v  . Flexeril [Cyclobenzaprine]     Abdominal pain.    . Nabumetone     Abdominal pain; dark, tarry stool.    . Oxybutynin     Pt does not remember.    . Raloxifene     Unknown.    . Sulfa Antibiotics Nausea Only    Syncope.    . Sulfonamide Derivatives   . Symbicort [Budesonide-Formoterol Fumarate]     "Jittery-ness."   . Tramadol Hcl     Numbness.    . Diclofenac Palpitations    Dark, tarry stool.      Current Outpatient Prescriptions  Medication Sig Dispense Refill  . calcium carbonate (TUMS EX) 750 MG chewable tablet Chew 2 tablets by mouth daily as needed for heartburn.    . Carboxymethylcellul-Glycerin (REFRESH OPTIVE OP) Apply 1 drop to eye as needed.    . carboxymethylcellulose (REFRESH PLUS) 0.5 % SOLN Place 2 drops into both eyes 4 (four) times daily.     . carvedilol (COREG) 3.125 MG tablet Take 1 tablet (3.125 mg total) by mouth 2 (two) times daily. 180 tablet 3  . carvedilol (COREG) 6.25 MG tablet Take 1 tablet (6.25 mg total) by mouth 2  (two) times daily. 180 tablet 3  . Cholecalciferol (VITAMIN D3) 1000 UNITS CAPS Take by mouth daily.      . cromolyn (NASALCROM) 5.2 MG/ACT nasal spray Place 1 spray into both nostrils 4 (four) times daily as needed for allergies.    Marland Kitchen glucosamine-chondroitin 500-400 MG tablet Take 1 tablet by mouth 2 (two) times daily.     Marland Kitchen levothyroxine (SYNTHROID, LEVOTHROID) 25 MCG tablet Take 1 tablet (25 mcg total) by mouth daily before breakfast. 30 tablet 11  . lisinopril (PRINIVIL,ZESTRIL) 2.5 MG tablet Take 1 tablet (2.5 mg total) by mouth 2 (two) times daily. 90 tablet 3  . loratadine (CLARITIN) 10 MG tablet Take 10 mg by mouth daily.    . NON FORMULARY Allergy injections     . pantoprazole (PROTONIX) 20 MG tablet Take 20 mg by mouth daily.     No current facility-administered medications for this visit.    Past Medical History  Diagnosis Date  . HYPOTHYROIDISM 08/29/2006  . 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 GERD 08/29/2006  . HEPATIC CYST 08/02/2007  . OSTEOARTHRITIS 08/29/2006  . LOW BACK PAIN 08/29/2006  . OSTEOPENIA 11/19/2008  . PARESTHESIA 03/05/2009  .  LIVER HEMANGIOMA 03/03/2010  . 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 MUSCLE SPASM 03/03/2010  . DEPRESSION 08/29/2006  . Diverticulosis   . Scoliosis   . CAD (coronary artery disease)     Nonobstructive. LAD 30% stenosis, small PDA of the circumflex 70% stenosis, right coronary artery 40% stenosis. Catheterization September 2015  . Nonischemic cardiomyopathy     EF 30%  . PVC's (premature ventricular contractions)     Past Surgical History  Procedure Laterality Date  . Tonsillectomy    . Abdominal hysterectomy    . Appendectomy    . Carpal tunnel release      both  . Bunionectomy      both  . Back surgery  2010,2006    lumb fusion  . Cataract extraction      both  . Shoulder arthroscopy  02/09/2011    Procedure: ARTHROSCOPY SHOULDER;  Surgeon: Hessie Dibble, MD;  Location: [facility];  Service:  Orthopedics;  Laterality: Right;  right shoulder arthroscopy subacromial decompression with rotator cuff repair  . Left and right heart catheterization with coronary angiogram N/A 10/04/2013    Procedure: LEFT AND RIGHT HEART CATHETERIZATION WITH CORONARY ANGIOGRAM;  Surgeon: Leonie Man, MD;  Location: [facility] CATH [facility];  Service: Cardiovascular;  Laterality: N/A;    ROS:  Mild cough and congestion chronic and improved.  Otherwise as stated in the HPI and negative for all other systems.   PHYSICAL EXAM BP 108/62 mmHg  Pulse 62 GENERAL:  Well appearing NECK:  No jugular venous distention, waveform within normal limits, carotid upstroke brisk and symmetric, no bruits, no thyromegaly LUNGS:  Clear to auscultation bilaterally BACK:  No CVA tenderness CHEST:  Unremarkable HEART:  PMI not displaced or sustained,S1 and S2 within normal limits, no S3, positive S4, no clicks, no rubs, no murmurs ABD:  Flat, positive bowel sounds normal in frequency in pitch, no bruits, no rebound, no guarding, no midline pulsatile mass, no hepatomegaly, no splenomegaly EXT:  2 plus pulses throughout, no edema, no cyanosis no clubbing   ASSESSMENT AND PLAN  CARDIOMYPATHY:  Today I cannot titrate her meds because her blood pressure and heart rate will not allow it.  Her EF is improved.   No change in therapy is indicated.   HTN: This is being managed in the context of treating her CHF  PVCs:   These are not particularly symptomatic.  No change in therapy is planned.

## 2014-07-15 NOTE — Patient Instructions (Signed)
Your physician wants you to follow-up in: 6 Months You will receive a reminder letter in the mail two months in advance. If you don't receive a letter, please call our office to schedule the follow-up appointment.  

## 2014-07-17 ENCOUNTER — Other Ambulatory Visit (HOSPITAL_BASED_OUTPATIENT_CLINIC_OR_DEPARTMENT_OTHER): Payer: Medicare Other

## 2014-07-17 DIAGNOSIS — D472 Monoclonal gammopathy: Secondary | ICD-10-CM

## 2014-07-17 LAB — CBC WITH DIFFERENTIAL/PLATELET
BASO%: 0.8 % (ref 0.0–2.0)
Basophils Absolute: 0.1 10*3/uL (ref 0.0–0.1)
EOS%: 1.6 % (ref 0.0–7.0)
Eosinophils Absolute: 0.1 10*3/uL (ref 0.0–0.5)
HCT: 39.4 % (ref 34.8–46.6)
HGB: 12.9 g/dL (ref 11.6–15.9)
LYMPH%: 38.9 % (ref 14.0–49.7)
MCH: 30.1 pg (ref 25.1–34.0)
MCHC: 32.7 g/dL (ref 31.5–36.0)
MCV: 92.1 fL (ref 79.5–101.0)
MONO#: 1.2 10*3/uL — ABNORMAL HIGH (ref 0.1–0.9)
MONO%: 19 % — ABNORMAL HIGH (ref 0.0–14.0)
NEUT#: 2.4 10*3/uL (ref 1.5–6.5)
NEUT%: 39.7 % (ref 38.4–76.8)
Platelets: 313 10*3/uL (ref 145–400)
RBC: 4.28 10*6/uL (ref 3.70–5.45)
RDW: 12.6 % (ref 11.2–14.5)
WBC: 6.1 10*3/uL (ref 3.9–10.3)
lymph#: 2.4 10*3/uL (ref 0.9–3.3)

## 2014-07-17 LAB — COMPREHENSIVE METABOLIC PANEL (CC13)
ALT: 16 U/L (ref 0–55)
AST: 20 U/L (ref 5–34)
Albumin: 3.7 g/dL (ref 3.5–5.0)
Alkaline Phosphatase: 74 U/L (ref 40–150)
Anion Gap: 9 mEq/L (ref 3–11)
BUN: 17 mg/dL (ref 7.0–26.0)
CO2: 25 mEq/L (ref 22–29)
Calcium: 9 mg/dL (ref 8.4–10.4)
Chloride: 104 mEq/L (ref 98–109)
Creatinine: 0.9 mg/dL (ref 0.6–1.1)
EGFR: 63 mL/min/{1.73_m2} — ABNORMAL LOW (ref >90)
Glucose: 79 mg/dl (ref 70–140)
Potassium: 5 mEq/L (ref 3.5–5.1)
Sodium: 139 mEq/L (ref 136–145)
Total Bilirubin: 0.53 mg/dL (ref 0.20–1.20)
Total Protein: 6.5 g/dL (ref 6.4–8.3)

## 2014-07-17 LAB — LACTATE DEHYDROGENASE (CC13): LDH: 168 U/L (ref 125–245)

## 2014-07-18 ENCOUNTER — Ambulatory Visit: Payer: Medicare Other | Admitting: Cardiology

## 2014-07-19 LAB — PROTEIN ELECTROPHORESIS, SERUM
Abnormal Protein Band1: 0.6 g/dL
Albumin ELP: 3.9 g/dL (ref 3.8–4.8)
Alpha-1-Globulin: 0.3 g/dL (ref 0.2–0.3)
Alpha-2-Globulin: 0.7 g/dL (ref 0.5–0.9)
Beta 2: 0.1 g/dL — ABNORMAL LOW (ref 0.2–0.5)
Beta Globulin: 1.1 g/dL — ABNORMAL HIGH (ref 0.4–0.6)
Gamma Globulin: 0.3 g/dL — ABNORMAL LOW (ref 0.8–1.7)
Total Protein, Serum Electrophoresis: 6.4 g/dL (ref 6.1–8.1)

## 2014-07-19 LAB — KAPPA/LAMBDA LIGHT CHAINS
Kappa free light chain: 1.32 mg/dL (ref 0.33–1.94)
Kappa:Lambda Ratio: 4.26 — ABNORMAL HIGH (ref 0.26–1.65)
Lambda Free Lght Chn: 0.31 mg/dL — ABNORMAL LOW (ref 0.57–2.63)

## 2014-07-19 LAB — IGG, IGA, IGM
IgA: 16 mg/dL — ABNORMAL LOW (ref 69–380)
IgG (Immunoglobin G), Serum: 353 mg/dL — ABNORMAL LOW (ref 690–1700)
IgM, Serum: 966 mg/dL — ABNORMAL HIGH (ref 52–322)

## 2014-07-21 ENCOUNTER — Other Ambulatory Visit: Payer: Self-pay | Admitting: Oncology

## 2014-10-09 ENCOUNTER — Ambulatory Visit
Admission: RE | Admit: 2014-10-09 | Discharge: 2014-10-09 | Disposition: A | Discharge status: Home / Self Care | Payer: Medicare Other | Source: Ambulatory Visit | Attending: Family Medicine | Admitting: Family Medicine

## 2014-10-09 ENCOUNTER — Other Ambulatory Visit: Payer: Self-pay | Admitting: Family Medicine

## 2014-10-09 DIAGNOSIS — R05 Cough: Secondary | ICD-10-CM

## 2014-10-09 DIAGNOSIS — R059 Cough, unspecified: Secondary | ICD-10-CM

## 2014-10-10 ENCOUNTER — Other Ambulatory Visit: Payer: Self-pay

## 2014-10-10 DIAGNOSIS — Z1231 Encounter for screening mammogram for malignant neoplasm of breast: Secondary | ICD-10-CM

## 2014-10-16 ENCOUNTER — Ambulatory Visit
Admission: RE | Admit: 2014-10-16 | Discharge: 2014-10-16 | Disposition: A | Discharge status: Home / Self Care | Payer: Medicare Other | Source: Ambulatory Visit

## 2014-10-16 DIAGNOSIS — Z1231 Encounter for screening mammogram for malignant neoplasm of breast: Secondary | ICD-10-CM

## 2014-10-17 ENCOUNTER — Institutional Professional Consult (permissible substitution): Payer: Medicare Other | Admitting: Pulmonary Disease

## 2014-10-21 ENCOUNTER — Other Ambulatory Visit: Payer: Medicare Other

## 2014-10-22 ENCOUNTER — Other Ambulatory Visit (HOSPITAL_BASED_OUTPATIENT_CLINIC_OR_DEPARTMENT_OTHER): Payer: Medicare Other

## 2014-10-22 ENCOUNTER — Telehealth: Payer: Self-pay | Admitting: Pulmonary Disease

## 2014-10-22 DIAGNOSIS — D472 Monoclonal gammopathy: Secondary | ICD-10-CM | POA: Diagnosis not present

## 2014-10-22 LAB — COMPREHENSIVE METABOLIC PANEL (CC13)
ALT: 15 U/L (ref 0–55)
AST: 18 U/L (ref 5–34)
Albumin: 3.7 g/dL (ref 3.5–5.0)
Alkaline Phosphatase: 85 U/L (ref 40–150)
Anion Gap: 7 mEq/L (ref 3–11)
BUN: 14 mg/dL (ref 7.0–26.0)
CO2: 26 mEq/L (ref 22–29)
Calcium: 9 mg/dL (ref 8.4–10.4)
Chloride: 104 mEq/L (ref 98–109)
Creatinine: 0.8 mg/dL (ref 0.6–1.1)
EGFR: 71 mL/min/{1.73_m2} — ABNORMAL LOW (ref >90)
Glucose: 105 mg/dl (ref 70–140)
Potassium: 4.9 mEq/L (ref 3.5–5.1)
Sodium: 137 mEq/L (ref 136–145)
Total Bilirubin: 0.46 mg/dL (ref 0.20–1.20)
Total Protein: 6.6 g/dL (ref 6.4–8.3)

## 2014-10-22 LAB — CBC WITH DIFFERENTIAL/PLATELET
BASO%: 1.5 % (ref 0.0–2.0)
Basophils Absolute: 0.1 10*3/uL (ref 0.0–0.1)
EOS%: 1.9 % (ref 0.0–7.0)
Eosinophils Absolute: 0.1 10*3/uL (ref 0.0–0.5)
HCT: 40.5 % (ref 34.8–46.6)
HGB: 13.3 g/dL (ref 11.6–15.9)
LYMPH%: 37.8 % (ref 14.0–49.7)
MCH: 30.3 pg (ref 25.1–34.0)
MCHC: 33 g/dL (ref 31.5–36.0)
MCV: 92 fL (ref 79.5–101.0)
MONO#: 1.1 10*3/uL — ABNORMAL HIGH (ref 0.1–0.9)
MONO%: 14.6 % — ABNORMAL HIGH (ref 0.0–14.0)
NEUT#: 3.2 10*3/uL (ref 1.5–6.5)
NEUT%: 44.2 % (ref 38.4–76.8)
Platelets: 312 10*3/uL (ref 145–400)
RBC: 4.4 10*6/uL (ref 3.70–5.45)
RDW: 12.3 % (ref 11.2–14.5)
WBC: 7.2 10*3/uL (ref 3.9–10.3)
lymph#: 2.7 10*3/uL (ref 0.9–3.3)

## 2014-10-22 LAB — LACTATE DEHYDROGENASE (CC13): LDH: 141 U/L (ref 125–245)

## 2014-10-23 ENCOUNTER — Ambulatory Visit (INDEPENDENT_AMBULATORY_CARE_PROVIDER_SITE_OTHER): Payer: Medicare Other | Admitting: Pulmonary Disease

## 2014-10-23 ENCOUNTER — Encounter: Payer: Self-pay | Admitting: Pulmonary Disease

## 2014-10-23 VITALS — BP 138/80 | HR 65 | Ht 64.0 in | Wt 133.6 lb

## 2014-10-23 DIAGNOSIS — R053 Chronic cough: Secondary | ICD-10-CM | POA: Insufficient documentation

## 2014-10-23 DIAGNOSIS — R05 Cough: Secondary | ICD-10-CM | POA: Diagnosis not present

## 2014-10-23 HISTORY — DX: Chronic cough: R05.3

## 2014-10-23 MED ORDER — FLUTICASONE PROPIONATE 50 MCG/ACT NA SUSP: 2.0000 | Freq: Every day | NASAL | Status: DC

## 2014-10-23 MED ORDER — PANTOPRAZOLE SODIUM 20 MG PO TBEC: 20.0000 mg | DELAYED_RELEASE_TABLET | Freq: Two times a day (BID) | ORAL | Status: DC

## 2014-10-23 MED ORDER — MOMETASONE FURO-FORMOTEROL FUM 200-5 MCG/ACT IN AERO: 2.0000 | INHALATION_SPRAY | Freq: Two times a day (BID) | RESPIRATORY_TRACT | Status: DC

## 2014-10-23 NOTE — Patient Instructions (Signed)
Start flonase spray Start dulera inhaler Increase protonix to twice daily.  Return in 1 month.

## 2014-10-23 NOTE — Telephone Encounter (Signed)
Papers received and given to Dr. Matilde Bash nurse Nothing further needed.

## 2014-10-23 NOTE — Telephone Encounter (Signed)
Pt has consult with Dr. Vaughan Browner today  I do not see that we received any records  I called Dr Andrew Au office and spoke with Balinda Quails and req that records be faxed

## 2014-10-23 NOTE — Progress Notes (Signed)
Subjective:    Patient ID: Kimberly Merritt, female    DOB: Aug 10, 1940, 74 y.o.   MRN: [id number]  HPI  Evaluation for chronic cough.  Kimberly Merritt has history of severe allergies. She complains of chronic nonproductive cough which started approximately a year ago. Symptoms were exacerbated after URTI last month. She saw her primary care doctor who prescribed a decongestant and cough suppressant. But the cough persisted. She was also tried on Qvar inhaler. She is also on albuterol when necessary but cannot say if it helps with the cough. She tried it only once but stopped because of a spell of "bronchial spasm" and coughing. She has no dyspnea except during prolonged spells of coughing. No complaints of wheezing mucus production fevers chills or hemoptysis. She had a chest x-ray on 10/09/14 which did not show any acute cardiopulmonary disease.  She has severe allergies to ragweed, grass, tress, household dust, mold, cat dander. She sees Dr. Donneta Romberg for this and gets allergy shots once a month. She also has nasal drip. She has GERD and is on Protonix once a day.   Past Medical History  Diagnosis Date  . HYPOTHYROIDISM 08/29/2006  . 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 GERD 08/29/2006  . HEPATIC CYST 08/02/2007  . OSTEOARTHRITIS 08/29/2006  . LOW BACK PAIN 08/29/2006  . OSTEOPENIA 11/19/2008  . PARESTHESIA 03/05/2009  . LIVER HEMANGIOMA 03/03/2010  . 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 MUSCLE SPASM 03/03/2010  . DEPRESSION 08/29/2006  . Diverticulosis   . Scoliosis   . CAD (coronary artery disease)     Nonobstructive. LAD 30% stenosis, small PDA of the circumflex 70% stenosis, right coronary artery 40% stenosis. Catheterization September 2015  . Nonischemic cardiomyopathy     EF 30%  . PVC's (premature ventricular contractions)   . Hypertension   . Allergy     Current outpatient prescriptions:  .  albuterol (PROAIR HFA) 108 (90 BASE) MCG/ACT inhaler, 2 puffs as needed, Disp: , Rfl:  .   calcium carbonate (TUMS EX) 750 MG chewable tablet, Chew 2 tablets by mouth daily as needed for heartburn., Disp: , Rfl:  .  Carboxymethylcellul-Glycerin (REFRESH OPTIVE OP), Apply 1 drop to eye as needed., Disp: , Rfl:  .  carboxymethylcellulose (REFRESH PLUS) 0.5 % SOLN, Place 2 drops into both eyes 4 (four) times daily. , Disp: , Rfl:  .  carvedilol (COREG) 3.125 MG tablet, Take 1 tablet (3.125 mg total) by mouth 2 (two) times daily., Disp: 180 tablet, Rfl: 3 .  carvedilol (COREG) 6.25 MG tablet, Take 1 tablet (6.25 mg total) by mouth 2 (two) times daily., Disp: 180 tablet, Rfl: 3 .  Cholecalciferol (VITAMIN D3) 1000 UNITS CAPS, Take by mouth daily.  , Disp: , Rfl:  .  cromolyn (NASALCROM) 5.2 MG/ACT nasal spray, Place 1 spray into both nostrils 4 (four) times daily as needed for allergies., Disp: , Rfl:  .  glucosamine-chondroitin 500-400 MG tablet, Take 1 tablet by mouth 2 (two) times daily. , Disp: , Rfl:  .  levothyroxine (SYNTHROID, LEVOTHROID) 25 MCG tablet, Take 1 tablet (25 mcg total) by mouth daily before breakfast., Disp: 30 tablet, Rfl: 11 .  lisinopril (PRINIVIL,ZESTRIL) 2.5 MG tablet, Take 1 tablet (2.5 mg total) by mouth 2 (two) times daily., Disp: 90 tablet, Rfl: 3 .  loratadine (CLARITIN) 10 MG tablet, Take 10 mg by mouth daily., Disp: , Rfl:  .  NON FORMULARY, Allergy injections , Disp: , Rfl:  .  pantoprazole (PROTONIX) 20 MG  tablet, Take 20 mg by mouth daily., Disp: , Rfl:   Review of Systems  Constitutional: Negative for fever and unexpected weight change.  HENT: Positive for congestion and sinus pressure. Negative for dental problem, ear pain, nosebleeds, postnasal drip, rhinorrhea, sneezing, sore throat and trouble swallowing.   Eyes: Positive for itching. Negative for redness.  Respiratory: Positive for cough and shortness of breath. Negative for chest tightness and wheezing.   Cardiovascular: Positive for palpitations and leg swelling.  Gastrointestinal: Negative for  nausea and vomiting.  Genitourinary: Negative for dysuria.  Musculoskeletal: Positive for joint swelling.  Skin: Negative for rash.  Neurological: Negative for headaches.  Hematological: Does not bruise/bleed easily.  Psychiatric/Behavioral: Negative for dysphoric mood. The patient is not nervous/anxious.    CXR (10/09/14) No acute cardiopulmonary disease. Stable chest from 09/25/2013.  Blood pressure 138/80, pulse 65, height 5\' 4"  (1.626 m), weight 133 lb 9.6 oz (60.601 kg), SpO2 98 %.    Objective:   Physical Exam  Constitutional: She is oriented to person, place, and time. She appears well-developed and well-nourished.  HENT:  Mouth/Throat: Oropharynx is clear and moist.  Eyes: Pupils are equal, round, and reactive to light.  Neck: Normal range of motion. Neck supple.  Cardiovascular: Normal rate, regular rhythm and normal heart sounds.   Pulmonary/Chest: Effort normal and breath sounds normal.  Abdominal: Soft. Bowel sounds are normal.  Musculoskeletal: Normal range of motion.  Neurological: She is alert and oriented to person, place, and time.  Skin: Skin is warm and dry.      Assessment & Plan:   Chronic cough This may be secondary to asthma/reactive airway disease. Her severe allergies, postnasal drip and GERD is likely contributing to her symptoms. Her symptoms started one year ago approximately when lisinopril was started. But were exacerbated after recent viral infection.  I'll plan is to treat her with Flonase nasal spray, Dulera inhaler and increase her PPI dose. Return in a month to assess response to therapy. If she still coughing I will discuss with her primary care and cardiology if she can be trialed off lisinopril. We can also consider an ENT consult for evaluation of laryngeal pharyngeal reflux.  She will need pulmonary function tests.  I will get them after her cough is better as the results will be unreliable if she is coughing constantly.  Plan: - Start  flonase spray - Start dulera inhaler - Increase protonix to twice daily.  Return in 1 month.  Marshell Garfinkel MD [facility] Pager [phone] If no answer or after 3pm call: [phone] 10/23/2014, 4:28 PM

## 2014-10-24 ENCOUNTER — Telehealth: Payer: Self-pay | Admitting: Pulmonary Disease

## 2014-10-24 LAB — PROTEIN ELECTROPHORESIS, SERUM
Abnormal Protein Band1: 0.7 g/dL
Albumin ELP: 3.8 g/dL (ref 3.8–4.8)
Alpha-1-Globulin: 0.3 g/dL (ref 0.2–0.3)
Alpha-2-Globulin: 0.7 g/dL (ref 0.5–0.9)
Beta 2: 0.1 g/dL — ABNORMAL LOW (ref 0.2–0.5)
Beta Globulin: 1.2 g/dL — ABNORMAL HIGH (ref 0.4–0.6)
Gamma Globulin: 0.4 g/dL — ABNORMAL LOW (ref 0.8–1.7)
Total Protein, Serum Electrophoresis: 6.5 g/dL (ref 6.1–8.1)

## 2014-10-24 LAB — KAPPA/LAMBDA LIGHT CHAINS
Kappa free light chain: 1.17 mg/dL (ref 0.33–1.94)
Kappa:Lambda Ratio: 3.66 — ABNORMAL HIGH (ref 0.26–1.65)
Lambda Free Lght Chn: 0.32 mg/dL — ABNORMAL LOW (ref 0.57–2.63)

## 2014-10-24 LAB — IGG, IGA, IGM
IgA: 20 mg/dL — ABNORMAL LOW (ref 69–380)
IgG (Immunoglobin G), Serum: 416 mg/dL — ABNORMAL LOW (ref 690–1700)
IgM, Serum: 1140 mg/dL — ABNORMAL HIGH (ref 52–322)

## 2014-10-24 NOTE — Telephone Encounter (Signed)
Patient has gotten rx filled for flonase and now she having breathing problems, [phone]

## 2014-10-24 NOTE — Telephone Encounter (Signed)
Spoke with pt. She reports the dulera is not covered. She called insurance and advair and/combivent is covered. Please advise Dr. Vaughan Browner. thanks

## 2014-10-24 NOTE — Telephone Encounter (Signed)
Spoke with pt. She reports the dulera is over $200 and can;t afford this and it's also not on formulary. Made pt aware to contact her insurance to see what is on her formulary and let us know. She will do so. Nothing further needed at this time

## 2014-10-24 NOTE — Telephone Encounter (Signed)
Spoke with pt, states she filled her flonase this morning and since using it has began having sinus congestion.  Pt has stopped use of this medication because of it's side effects.  States was using nasalcrom and this was working well for her before the switch to flonase.  Pt is requesting an alternative for the flonase as well as the below medication.    Dr. Vaughan Browner please advise.  Thanks!

## 2014-10-25 MED ORDER — FLUTICASONE-SALMETEROL 250-50 MCG/DOSE IN AEPB: 1.0000 | INHALATION_SPRAY | Freq: Two times a day (BID) | RESPIRATORY_TRACT | Status: DC

## 2014-10-25 MED ORDER — CROMOLYN SODIUM 5.2 MG/ACT NA AERS: 1.0000 | INHALATION_SPRAY | Freq: Four times a day (QID) | NASAL | Status: DC | PRN

## 2014-10-25 NOTE — Telephone Encounter (Signed)
Ok to use nasalcrom and advair 250/50 as alternatives. Please order. Thanks  Kimberly Merritt

## 2014-10-25 NOTE — Telephone Encounter (Signed)
Rx sent to pharmacy Called pharmacy to cancel [facility] per patient's request Patient notified. Nothing further needed.

## 2014-10-25 NOTE — Telephone Encounter (Signed)
Pt cb, please call back at [phone], told patient that we have been trying to call and only get a fast busy signal, did a test call to ensure we could get through and phone worked just fine.

## 2014-10-25 NOTE — Telephone Encounter (Signed)
atc pt X2, fast busy signal.  Wcb.  

## 2014-10-29 ENCOUNTER — Ambulatory Visit: Payer: Medicare Other | Admitting: Oncology

## 2014-11-12 ENCOUNTER — Telehealth: Payer: Self-pay | Admitting: Oncology

## 2014-11-12 ENCOUNTER — Ambulatory Visit (HOSPITAL_BASED_OUTPATIENT_CLINIC_OR_DEPARTMENT_OTHER): Payer: Medicare Other | Admitting: Oncology

## 2014-11-12 VITALS — BP 125/81 | HR 69 | Temp 98.1°F | Resp 18 | Ht 64.0 in | Wt 131.2 lb

## 2014-11-12 DIAGNOSIS — R05 Cough: Secondary | ICD-10-CM

## 2014-11-12 DIAGNOSIS — D472 Monoclonal gammopathy: Secondary | ICD-10-CM

## 2014-11-12 DIAGNOSIS — I420 Dilated cardiomyopathy: Secondary | ICD-10-CM | POA: Diagnosis not present

## 2014-11-12 DIAGNOSIS — D801 Nonfamilial hypogammaglobulinemia: Secondary | ICD-10-CM | POA: Diagnosis not present

## 2014-11-12 DIAGNOSIS — R059 Cough, unspecified: Secondary | ICD-10-CM

## 2014-11-12 NOTE — Progress Notes (Signed)
[place]  [phone] [phone]     ID: Kimberly Merritt DOB: [birth year]  MR#: [id number]  HAL#:[id number]  Patient Care Team: Gaynelle Arabian, MD as PCP - General (Family Medicine) Melrose Nakayama, MD as Attending Physician (Orthopedic Surgery) Chauncey Cruel, MD (Hematology and Oncology) Druscilla Brownie, MD as Consulting Physician (Dermatology) Clarene Essex, MD (Gastroenterology) Rutherford Guys, MD as Attending Physician (Ophthalmology) Terrance Mass, MD (Obstetrics and Gynecology) Mal Misty, MD as Attending Physician (Vascular Surgery) Jovita Gamma, MD as Consulting Physician (Neurosurgery) Minus Breeding, MD as Consulting Physician (Cardiology) OTHER MD: Minus Breeding MD, Marshell Garfinkel MD  CHIEF COMPLAINT: M-GUS/ low-grade lymphoproliferative process  CURRENT TREATMENT: Observation   HISTORY PRESENT ILLNESS::  from the earlier summary note:  "Kimberly Schmid" Merritt is a 74 y/o [place] woman I evaluated originally in October of 2004 for a monoclonal gammopathy. At that time she had a white cell count of 7.2, hemoglobin 12.8, MCV 94.5, and platelets 298,000. Protein workup showed an IgM kappa paraprotein of 0.6 g, with a total IgG slightly depressed at 556, but a normal IgA at 82. Because an IgM paraprotein can be associated with a low grade lymphoproliferative process (Waldenstrm's macroglobulinemia) [name] underwent bone marrow biopsy 11/27/2002, which showed ([id number]) a normal cellular marrow with trilineage hematopoiesis and 5% plasma cells. The peripheral blood film showed no significant abnormalities and on the biopsy para-spicular lymphoid aggregates were not noted. 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  The patient was followed with observation alone through May of 2009, with no evidence of progression in her IgM kappa clone. As of 06/03/2007 her total IgM spike was 0.83, her IgG was 490  and her IgA was 36. The patient was released from followup here at that time.  More recently, as the 10 year anniversary of her original bone marrow biopsy past, Dr. Dorann Lodge of referred the patient back for reassessment and possible bone marrow rebiopsy. Kimberly Schmid was scheduled here for lab work 09/25/2013. However when she had her blood drawn she complained of some shortness of breath and her pulse was checked. It was 33. The patient was referred to the emergency room where she was evaluated by Dr. Percival Spanish who felt the patient's true heart rate was higher (she has ectopic beats which generated a week pulse and may have been missed) since in the emergency room EKG her rate was 98 with normal sinus rhythm. Troponins were negative. BNP was slightly elevated.  Dr. Percival Spanish set the patient up for an echocardiogram 09/27/2013 and this showed an ejection fraction of 30-35%. There was diffuse hypokinesis. She underwent right and left heart catheterization 10/04/2013 which showed no significant obstructions. The study did confirm an ejection fraction of roughly 35% with global hypokinesis. She is followed by Dr. Percival Spanish for her new diagnosis of non-ischemic cardiomyopathy.   Her subsequent history is as detailed below  INTERVAL HISTORY/ REVIEW OF SYSTEMS:  Kimberly Schmid returns today for 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  The interval history is significant for a persistent upper respiratory and mucosal problem which has been ongoing since August. She has seen Dr. Marisue Humble and Vaughan Browner  For this and they have wisely avoided antibiotics, since she never developed an actual fever, feeling that this is most likely a viral problem. It has never completely cleared, and she has been tried on MRI to have inhalers  And other medication changes to see if they could make headway on her persistent cough.  The problem however persists. Today in particular she has substance sores on her uvula and lower  throat.   note that she was unable to take the Flonase or Advair because a congested her, she says. She was unable to afford the mometasone - formoterol combination. She continues on lisinopril but does not believe that is the cause of her persistent cough   aside from this issue, she tells me her congestive heart failure is much better, and she was able to get some gardening done recently. She does tire more easily than she would like. She has significant arthritis and of course this is painful and also limits her. It chiefly affects her neck back hands and other major joints. Has been no change in bowel or bladder habits. She never developed any purulent sputum or hemoptysis. She denies unusual headaches, visual changes, nausea, or vomiting. A detailed review of systems was otherwise stable.  PAST MEDICAL HISTORY: Past Medical History  Diagnosis Date  . HYPOTHYROIDISM 08/29/2006  . 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 GERD 08/29/2006  . HEPATIC CYST 08/02/2007  . OSTEOARTHRITIS 08/29/2006  . LOW BACK PAIN 08/29/2006  . OSTEOPENIA 11/19/2008  . PARESTHESIA 03/05/2009  . LIVER HEMANGIOMA 03-13-2010  . MONOCLONAL GAMMOPATHY 03/13/10  . MUSCLE SPASM 2010/03/13  . DEPRESSION 08/29/2006  . Diverticulosis   . Scoliosis   . CAD (coronary artery disease)     Nonobstructive. LAD 30% stenosis, small PDA of the circumflex 70% stenosis, right coronary artery 40% stenosis. Catheterization September 2015  . Nonischemic cardiomyopathy     EF 30%  . PVC's (premature ventricular contractions)   . Hypertension   . Allergy     PAST SURGICAL HISTORY: Past Surgical History  Procedure Laterality Date  . Tonsillectomy    . Abdominal hysterectomy    . Appendectomy    . Carpal tunnel release      both  . Bunionectomy      both  . Back surgery  03-13-08    lumb fusion  . Cataract extraction      both  . Shoulder arthroscopy  02/09/2011    Procedure: ARTHROSCOPY SHOULDER;  Surgeon: Hessie Dibble, MD;  Location: [place];  Service: Orthopedics;  Laterality: Right;  right shoulder arthroscopy subacromial decompression with rotator cuff repair  . Left and right heart catheterization with coronary angiogram N/A 10/04/2013    Procedure: LEFT AND RIGHT HEART CATHETERIZATION WITH CORONARY ANGIOGRAM;  Surgeon: Leonie Man, MD;  Location[facility] C[facility];  Service: Cardiovascular;  Laterality: N/A;    FAMILY HISTORY Family History  Problem Relation Age of Onset  . Hypertension Other   . Stroke Mother   . Cancer Father   . Emphysema Father    the patient's father died at the age of 42 from lung cancer in the setting of tobacco abuse. [name] mother died at the age of 75 following a stroke. The patient had one brother, who is severely retarded and died from pneumonia at the age of 83. There were no sisters. There is no history of blood problems or cancer in the family to the patient's knowledge  GYNECOLOGIC HISTORY:  No LMP recorded. Patient is postmenopausal. Menarche age 73. The patient is GX P0. She stopped having periods in 03-13-78 and took hormone replacement for almost 20 years.  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 Her husband died in 03-14-91 from a myocardial infarction. She  lives by herself, with one cat for company.    ADVANCED DIRECTIVES: Not in place. If she became disabled she would want [place] to contact her neighbor and third cousin Andrey Cota at [place]: Social History  Substance Use Topics  . Smoking status: Never Smoker   . Smokeless tobacco: Never Used  . Alcohol Use: No     Colonoscopy: 2014  PAP: 2013  Bone density: 2012  Lipid panel:  Allergies  Allergen Reactions  . Acetaminophen     REACTION: high dose - tremor  . Alendronate Sodium     Stomach upset.   . Contrast Media [Iodinated Diagnostic Agents]     Syncope.   . Diflunisal     Stomach pain.    .  Doxycycline     n/v  . Flexeril [Cyclobenzaprine]     Abdominal pain.    . Nabumetone     Abdominal pain; dark, tarry stool.    . Oxybutynin     Pt does not remember.    . Raloxifene     Unknown.    . Sulfa Antibiotics Nausea Only    Syncope.    . Sulfonamide Derivatives   . Symbicort [Budesonide-Formoterol Fumarate]     "Jittery-ness."   . Tramadol Hcl     Numbness.    . Diclofenac Palpitations    Dark, tarry stool.      Current Outpatient Prescriptions  Medication Sig Dispense Refill  . albuterol (PROAIR HFA) 108 (90 BASE) MCG/ACT inhaler 2 puffs as needed    . calcium carbonate (TUMS EX) 750 MG chewable tablet Chew 2 tablets by mouth daily as needed for heartburn.    . Carboxymethylcellul-Glycerin (REFRESH OPTIVE OP) Apply 1 drop to eye as needed.    . carboxymethylcellulose (REFRESH PLUS) 0.5 % SOLN Place 2 drops into both eyes 4 (four) times daily.     . carvedilol (COREG) 3.125 MG tablet Take 1 tablet (3.125 mg total) by mouth 2 (two) times daily. 180 tablet 3  . carvedilol (COREG) 6.25 MG tablet Take 1 tablet (6.25 mg total) by mouth 2 (two) times daily. 180 tablet 3  . Cholecalciferol (VITAMIN D3) 1000 UNITS CAPS Take by mouth daily.      . cromolyn (NASALCROM) 5.2 MG/ACT nasal spray Place 1 spray into both nostrils 4 (four) times daily.    . cromolyn (NASALCROM) 5.2 MG/ACT nasal spray Place 1 spray into both nostrils 4 (four) times daily as needed for allergies. 26 mL 1  . fluticasone (FLONASE) 50 MCG/ACT nasal spray Place 2 sprays into both nostrils daily. 16 g 2  . Fluticasone-Salmeterol (ADVAIR) 250-50 MCG/DOSE AEPB Inhale 1 puff into the lungs 2 (two) times daily. 60 each 1  . glucosamine-chondroitin 500-400 MG tablet Take 1 tablet by mouth 2 (two) times daily.     Marland Kitchen levothyroxine (SYNTHROID, LEVOTHROID) 25 MCG tablet Take 1 tablet (25 mcg total) by mouth daily before breakfast. 30 tablet 11  . lisinopril (PRINIVIL,ZESTRIL) 2.5 MG tablet Take 1 tablet (2.5 mg total)  by mouth 2 (two) times daily. 90 tablet 3  . loratadine (CLARITIN) 10 MG tablet Take 10 mg by mouth daily.    . mometasone-formoterol (DULERA) 200-5 MCG/ACT AERO Inhale 2 puffs into the lungs 2 (two) times daily. 1 Inhaler 2  . NON FORMULARY Allergy injections     . pantoprazole (PROTONIX) 20 MG tablet Take 1 tablet (20 mg total) by mouth 2 (two) times daily. 60 tablet 3   No  current facility-administered medications for this visit.    OBJECTIVE: Older white woman who appears stated age 74 Vitals:   11/12/14 1051  BP: 125/81  Pulse: 69  Temp: 98.1 F (36.7 C)  Resp: 18     Body mass index is 22.51 kg/(m^2).    ECOG FS:1 - Symptomatic but completely ambulatory  Sclerae unicteric, EOMs intact Oropharynx shows no thrush;  I do not see well-defined ulcers over the uvula or posterior throat No cervical or supraclavicular adenopathy Lungs no rales or rhonchi Heart regular rate and rhythm Abd soft, nontender, positive bowel sounds MSK no focal spinal tenderness, no upper extremity lymphedema Neuro: nonfocal, well oriented, appropriate affect Breasts:  deferred   LAB RESULTS: Results for CHRISTIONA, SIDDIQUE (MRN [id number]) as of 11/12/2014 11:37  Ref. Range 09/25/2013 13:21 10/18/2013 09:48 04/24/2014 10:18 07/17/2014 09:58 10/22/2014 10:39  IgG (Immunoglobin G), Serum Latest Ref Range: 667-264-5031 mg/dL 321 (L)  347 (L) 353 (L) 416 (L)  IgA Latest Ref Range: 69-380 mg/dL 18 (L)  17 (L) 16 (L) 20 (L)  IgM, Serum Latest Ref Range: 52-322 mg/dL 992 (H)  1060 (H) 966 (H) 1140 (H)  Results for LAKECIA, DESCHAMPS (MRN [id number]) as of 11/12/2014 11:37  Ref. Range 09/25/2013 13:21 10/18/2013 09:48 04/24/2014 10:18 07/17/2014 09:58 10/22/2014 10:39  Kappa:Lambda Ratio Latest Ref Range: 0.26-1.65  0.69 1.61 6.58 (H) 4.26 (H) 3.66 (H)  Results for ZAYNEB, BAUCUM (MRN [id number]) as of 11/12/2014 11:37  Ref. Range 09/25/2013 13:21 10/18/2013 09:48 04/24/2014 10:18 07/17/2014 09:58 10/22/2014 10:39  M-SPIKE, % Latest  Units: g/dL 0.72 0.87       CMP     Component Value Date/Time   NA 137 10/22/2014 1039   NA 136 10/02/2013 1234   K 4.9 10/22/2014 1039   K 5.3 10/02/2013 1234   CL 100 10/02/2013 1234   CO2 26 10/22/2014 1039   CO2 27 10/02/2013 1234   GLUCOSE 105 10/22/2014 1039   GLUCOSE 88 10/02/2013 1234   BUN 14.0 10/22/2014 1039   BUN 18 10/02/2013 1234   CREATININE 0.8 10/22/2014 1039   CREATININE 0.84 10/02/2013 1234   CREATININE 0.8 08/21/2013 1354   CALCIUM 9.0 10/22/2014 1039   CALCIUM 9.4 10/02/2013 1234   PROT 6.6 10/22/2014 1039   PROT 6.9 10/02/2013 1234   ALBUMIN 3.7 10/22/2014 1039   ALBUMIN 4.2 10/02/2013 1234   AST 18 10/22/2014 1039   AST 22 10/02/2013 1234   ALT 15 10/22/2014 1039   ALT 20 10/02/2013 1234   ALKPHOS 85 10/22/2014 1039   ALKPHOS 84 10/02/2013 1234   BILITOT 0.46 10/22/2014 1039   BILITOT 0.4 10/02/2013 1234   GFRNONAA 69 10/02/2013 1234   GFRNONAA 68.51 11/25/2009 1210   GFRAA 80 10/02/2013 1234   GFRAA  04/24/2008 1110    >60        The eGFR has been calculated using the MDRD equation. This calculation has not been validated in all clinical situations. eGFR's persistently <60 mL/min signify possible Chronic Kidney Disease.    I No results found for: SPEP  Lab Results  Component Value Date   WBC 7.2 10/22/2014   NEUTROABS 3.2 10/22/2014   HGB 13.3 10/22/2014   HCT 40.5 10/22/2014   MCV 92.0 10/22/2014   PLT 312 10/22/2014      Chemistry      Component Value Date/Time   NA 137 10/22/2014 1039   NA 136 10/02/2013 1234   K 4.9 10/22/2014 1039   K  5.3 10/02/2013 1234   CL 100 10/02/2013 1234   CO2 26 10/22/2014 1039   CO2 27 10/02/2013 1234   BUN 14.0 10/22/2014 1039   BUN 18 10/02/2013 1234   CREATININE 0.8 10/22/2014 1039   CREATININE 0.84 10/02/2013 1234   CREATININE 0.8 08/21/2013 1354      Component Value Date/Time   CALCIUM 9.0 10/22/2014 1039   CALCIUM 9.4 10/02/2013 1234   ALKPHOS 85 10/22/2014 1039   ALKPHOS 84  10/02/2013 1234   AST 18 10/22/2014 1039   AST 22 10/02/2013 1234   ALT 15 10/22/2014 1039   ALT 20 10/02/2013 1234   BILITOT 0.46 10/22/2014 1039   BILITOT 0.4 10/02/2013 1234       No results found for: LABCA2  No components found for: LABCA125  No results for input(s): INR in the last 168 hours.  Urinalysis    Component Value Date/Time   COLORURINE LT. YELLOW 06/08/2011 0830   APPEARANCEUR CLEAR 06/08/2011 0830   LABSPEC 1.010 06/08/2011 0830   PHURINE 6.5 06/08/2011 0830   GLUCOSEU NEGATIVE 06/08/2011 0830   GLUCOSEU NEGATIVE 04/19/2008 1216   HGBUR NEGATIVE 06/08/2011 0830   BILIRUBINUR NEGATIVE 06/08/2011 0830   KETONESUR NEGATIVE 06/08/2011 0830   PROTEINUR NEGATIVE 04/19/2008 1216   UROBILINOGEN 0.2 06/08/2011 0830   NITRITE NEGATIVE 06/08/2011 0830   LEUKOCYTESUR NEGATIVE 06/08/2011 0830    STUDIES: Mm Digital Screening Bilateral  10/16/2014   CLINICAL DATA:  Screening.  EXAM: DIGITAL SCREENING BILATERAL MAMMOGRAM WITH CAD  COMPARISON:  Previous exam(s).  ACR Breast Density Category c: The breast tissue is heterogeneously dense, which may obscure small masses.  FINDINGS: There are no findings suspicious for malignancy. Images were processed with CAD.  IMPRESSION: No mammographic evidence of malignancy. A result letter of this screening mammogram will be mailed directly to the patient.  RECOMMENDATION: Screening mammogram in one year. (Code:SM-B-01Y)  BI-RADS CATEGORY  1: Negative.   Electronically Signed   By: Lillia Mountain M.D.   On: 10/16/2014 14:32     ASSESSMENT: 74 y.o. 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 When it causes amyloidosis of the heart, which is quite rare, the pattern is restrictive. 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 In case of an infection, IVIG supplementation should be considered  PLAN:  Kimberly Schmid is doing well as far as her M-GUS is concerned , with no evidence of disease progression.  She is chronically hypogammaglobulinemic, and this of course is not something that is going to get better on its own. The type of infections she has been experiencing, which is almost certainly viral, are precisely the ones that she would have most trouble fighting off.  I think she would do well to receive some IVIG. I discussed the possible side effects, complications and toxicities of these agents, which however are generally well tolerated.  I don't think she will need routine replacement, but in a case like this when has been about 2 months and she has an quite shaken off the upper respiratory and mucosal problems, I think IVIG as indicated and we are proceeding with that either this week.  I am hopeful this will all of her to get rid of the problem. Of course this  does not mean she does not need to supportive care she continues to receive through pulmonary and her primary care physician.   as far as the MGUS is concerned she will return to see me in one year with labs and week before that. If any similar persistent infectious /viral issues recur she will let me know and we can repeat the IVIG on an as-needed basis.  Chauncey Cruel, MD   11/12/2014 10:53 AM

## 2014-11-12 NOTE — Telephone Encounter (Signed)
Appointments made and avs pritned for pateint,email to maggie to check on chemo as books are capped

## 2014-11-14 ENCOUNTER — Telehealth: Payer: Self-pay | Admitting: *Deleted

## 2014-11-14 NOTE — Telephone Encounter (Signed)
Per staff message and POF I have scheduled appts. Advised scheduler of appts. JMW  

## 2014-11-15 ENCOUNTER — Telehealth: Payer: Self-pay | Admitting: Oncology

## 2014-11-15 ENCOUNTER — Institutional Professional Consult (permissible substitution): Payer: Medicare Other | Admitting: Pulmonary Disease

## 2014-11-15 NOTE — Telephone Encounter (Signed)
Called patient and she is aware of her appointments next week

## 2014-11-18 ENCOUNTER — Ambulatory Visit (HOSPITAL_BASED_OUTPATIENT_CLINIC_OR_DEPARTMENT_OTHER): Payer: Medicare Other

## 2014-11-18 VITALS — BP 125/54 | HR 62 | Temp 97.4°F | Resp 18

## 2014-11-18 DIAGNOSIS — K130 Diseases of lips: Secondary | ICD-10-CM

## 2014-11-18 DIAGNOSIS — D801 Nonfamilial hypogammaglobulinemia: Secondary | ICD-10-CM | POA: Diagnosis not present

## 2014-11-18 DIAGNOSIS — R053 Chronic cough: Secondary | ICD-10-CM

## 2014-11-18 DIAGNOSIS — D472 Monoclonal gammopathy: Secondary | ICD-10-CM

## 2014-11-18 DIAGNOSIS — R05 Cough: Secondary | ICD-10-CM

## 2014-11-18 MED ORDER — ACETAMINOPHEN 325 MG PO TABS
ORAL_TABLET | ORAL | Status: AC
  Filled 2014-11-18: qty 2

## 2014-11-18 MED ORDER — SODIUM CHLORIDE 0.9 % IV SOLN
INTRAVENOUS | Status: DC
  Administered 2014-11-18: 09:00:00 via INTRAVENOUS

## 2014-11-18 MED ORDER — DIPHENHYDRAMINE HCL 25 MG PO TABS
25.0000 mg | ORAL_TABLET | Freq: Once | ORAL | Status: AC
  Administered 2014-11-18: 25 mg via ORAL
  Filled 2014-11-18: qty 1

## 2014-11-18 MED ORDER — DIPHENHYDRAMINE HCL 25 MG PO CAPS
ORAL_CAPSULE | ORAL | Status: AC
  Filled 2014-11-18: qty 1

## 2014-11-18 MED ORDER — IMMUNE GLOBULIN (HUMAN) 20 GM/200ML IV SOLN
1.0000 g/kg | Freq: Once | INTRAVENOUS | Status: AC
  Administered 2014-11-18: 60 g via INTRAVENOUS
  Filled 2014-11-18: qty 600

## 2014-11-18 MED ORDER — ACETAMINOPHEN 325 MG PO TABS
650.0000 mg | ORAL_TABLET | Freq: Once | ORAL | Status: AC
  Administered 2014-11-18: 650 mg via ORAL

## 2014-11-18 NOTE — Patient Instructions (Signed)

## 2014-11-19 ENCOUNTER — Ambulatory Visit (HOSPITAL_BASED_OUTPATIENT_CLINIC_OR_DEPARTMENT_OTHER): Payer: Medicare Other

## 2014-11-19 ENCOUNTER — Other Ambulatory Visit: Payer: Self-pay | Admitting: Oncology

## 2014-11-19 VITALS — BP 115/62 | HR 55 | Temp 96.9°F | Resp 20

## 2014-11-19 DIAGNOSIS — D472 Monoclonal gammopathy: Secondary | ICD-10-CM

## 2014-11-19 MED ORDER — ACETAMINOPHEN 325 MG PO TABS: 650.0000 mg | ORAL_TABLET | Freq: Once | ORAL | Status: DC

## 2014-11-19 MED ORDER — METHYLPREDNISOLONE SODIUM SUCC 125 MG IJ SOLR
60.0000 mg | Freq: Once | INTRAMUSCULAR | Status: AC
  Administered 2014-11-19: 60 mg via INTRAVENOUS

## 2014-11-19 MED ORDER — SODIUM CHLORIDE 0.9 % IV SOLN
INTRAVENOUS | Status: DC
  Administered 2014-11-19: 10:00:00 via INTRAVENOUS

## 2014-11-19 MED ORDER — METHYLPREDNISOLONE SODIUM SUCC 125 MG IJ SOLR
INTRAMUSCULAR | Status: AC
  Filled 2014-11-19: qty 2

## 2014-11-19 MED ORDER — DIPHENHYDRAMINE HCL 25 MG PO CAPS: 25.0000 mg | ORAL_CAPSULE | Freq: Once | ORAL | Status: DC

## 2014-11-19 NOTE — Patient Instructions (Signed)

## 2014-11-19 NOTE — Progress Notes (Signed)
Pt stated she had chills and body aches for about 7 hours last night. Also a headache, she took tylenol about 1 am. Headache is still a pressure across her forehead. S/w Dr Jana Hakim and he cancelled IVIG today, ordered 250 cc fluids and solumedrol. Given.

## 2014-11-21 ENCOUNTER — Other Ambulatory Visit: Payer: Self-pay

## 2014-11-21 ENCOUNTER — Telehealth: Payer: Self-pay | Admitting: *Deleted

## 2014-11-21 ENCOUNTER — Other Ambulatory Visit: Payer: Self-pay | Admitting: Oncology

## 2014-11-21 ENCOUNTER — Telehealth: Payer: Self-pay | Admitting: Oncology

## 2014-11-21 DIAGNOSIS — D849 Immunodeficiency, unspecified: Secondary | ICD-10-CM

## 2014-11-21 NOTE — Telephone Encounter (Signed)
Patient aware of 10/21 lab   Kimberly Merritt

## 2014-11-21 NOTE — Progress Notes (Signed)
Spoke with patient regarding her next infusion of IVIG, per Dr. Jana Hakim he would like labs drawn first and then he will decide after reviewing her labs.  POF entered for lab appt tomorrow.

## 2014-11-21 NOTE — Telephone Encounter (Signed)
Voicemail receive from patient inquiring about next IVIG appointment. IVIG was postponed in the past. Please advise. Message sent to RN Kay/MD [place].

## 2014-11-22 ENCOUNTER — Other Ambulatory Visit (HOSPITAL_BASED_OUTPATIENT_CLINIC_OR_DEPARTMENT_OTHER): Payer: Medicare Other

## 2014-11-22 DIAGNOSIS — D849 Immunodeficiency, unspecified: Secondary | ICD-10-CM

## 2014-11-22 LAB — CBC WITH DIFFERENTIAL/PLATELET
BASO%: 1.8 % (ref 0.0–2.0)
Basophils Absolute: 0.1 10*3/uL (ref 0.0–0.1)
EOS%: 1.3 % (ref 0.0–7.0)
Eosinophils Absolute: 0.1 10*3/uL (ref 0.0–0.5)
HCT: 38.8 % (ref 34.8–46.6)
HGB: 12.8 g/dL (ref 11.6–15.9)
LYMPH%: 37.4 % (ref 14.0–49.7)
MCH: 30.2 pg (ref 25.1–34.0)
MCHC: 33 g/dL (ref 31.5–36.0)
MCV: 91.6 fL (ref 79.5–101.0)
MONO#: 1.1 10*3/uL — ABNORMAL HIGH (ref 0.1–0.9)
MONO%: 16.7 % — ABNORMAL HIGH (ref 0.0–14.0)
NEUT#: 2.7 10*3/uL (ref 1.5–6.5)
NEUT%: 42.8 % (ref 38.4–76.8)
Platelets: 284 10*3/uL (ref 145–400)
RBC: 4.24 10*6/uL (ref 3.70–5.45)
RDW: 12.7 % (ref 11.2–14.5)
WBC: 6.3 10*3/uL (ref 3.9–10.3)
lymph#: 2.4 10*3/uL (ref 0.9–3.3)

## 2014-11-23 LAB — IGG, IGA, IGM
IgA: 117 mg/dL (ref 69–380)
IgG (Immunoglobin G), Serum: 1650 mg/dL (ref 690–1700)
IgM, Serum: 1070 mg/dL — ABNORMAL HIGH (ref 52–322)

## 2014-11-25 ENCOUNTER — Ambulatory Visit: Payer: Medicare Other | Admitting: Pulmonary Disease

## 2014-11-25 ENCOUNTER — Other Ambulatory Visit: Payer: Self-pay | Admitting: *Deleted

## 2014-11-26 ENCOUNTER — Other Ambulatory Visit: Payer: Self-pay | Admitting: Oncology

## 2014-11-26 ENCOUNTER — Other Ambulatory Visit: Payer: Self-pay | Admitting: *Deleted

## 2014-11-26 MED ORDER — ACYCLOVIR 400 MG PO TABS: 400.0000 mg | ORAL_TABLET | Freq: Every day | ORAL | Status: DC

## 2014-11-26 NOTE — Progress Notes (Unsigned)
I called LI to let her know that her IgG level is now in the normal range and she does not need a second dose of IVIG.  She still having her mouth sores. I'm starting her on acyclovir twice a day for at least a month. Hopefully that will take care of the problem.  Otherwise she will return to see me in one year. She knows to call for any problems that may develop before that visit.

## 2014-11-28 ENCOUNTER — Encounter: Payer: Self-pay | Admitting: Pulmonary Disease

## 2014-11-28 ENCOUNTER — Ambulatory Visit (INDEPENDENT_AMBULATORY_CARE_PROVIDER_SITE_OTHER): Payer: Medicare Other | Admitting: Pulmonary Disease

## 2014-11-28 VITALS — BP 134/72 | HR 58 | Temp 96.7°F | Ht 64.0 in | Wt 133.6 lb

## 2014-11-28 DIAGNOSIS — R05 Cough: Secondary | ICD-10-CM

## 2014-11-28 DIAGNOSIS — R053 Chronic cough: Secondary | ICD-10-CM

## 2014-11-28 NOTE — Patient Instructions (Signed)
We will schedule you for lung function tests. Continue current therapy.  Return to clinic in 6 months

## 2014-11-28 NOTE — Progress Notes (Signed)
Subjective:    Patient ID: Kimberly Merritt, female    DOB: 1940-07-21, 74 y.o.   MRN: [id number]   PROBLEM LIST: Chronic cough. 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 She complains of chronic nonproductive cough which started approximately a year ago. Symptoms were exacerbated after URTI last month. She saw her primary care doctor who prescribed a decongestant and cough suppressant. But the cough persisted. She was also tried on Qvar inhaler. She is also on albuterol when necessary but cannot say if it helps with the cough. She tried it only once but stopped because of a spell of "bronchial spasm" and coughing. She has no dyspnea except during prolonged spells of coughing. No complaints of wheezing mucus production fevers chills or hemoptysis. She had a chest x-ray on 10/09/14 which did not show any acute cardiopulmonary disease.  She has severe allergies to ragweed, grass, tress, household dust, mold, cat dander. She sees Dr. Donneta Romberg for this and gets allergy shots once a month. She also has nasal drip. She has GERD and is on Protonix.  Interim History: She was started on [facility] but cannot afford it. She was then tried on Advair but it gave her oral thrush in spite of rinsing her mouth periodically. She also tried Flonase but that caused nasal congestion so she stopped that as well. She is continuing to take her PPI twice a day. She reports that her dry hacking cough has improved significantly. She was seen at hematology Dr. Jana Hakim for follow-up of her low-grade myelo proliferative disorder, hypogammaglobulinemia and was given a single infusion of IVIG on 10/24.  Past Medical History  Diagnosis Date  . HYPOTHYROIDISM 08/29/2006  . 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 GERD 08/29/2006  . HEPATIC CYST 08/02/2007  . OSTEOARTHRITIS 08/29/2006  . LOW BACK PAIN 08/29/2006   . OSTEOPENIA 11/19/2008  . PARESTHESIA 03/05/2009  . LIVER HEMANGIOMA 03/03/2010  . 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 MUSCLE SPASM 03/03/2010  . DEPRESSION 08/29/2006  . Diverticulosis   . Scoliosis   . CAD (coronary artery disease)     Nonobstructive. LAD 30% stenosis, small PDA of the circumflex 70% stenosis, right coronary artery 40% stenosis. Catheterization September 2015  . Nonischemic cardiomyopathy (HCC)     EF 30%  . PVC's (premature ventricular contractions)   . Hypertension   . Allergy     Current outpatient prescriptions:  .  acyclovir (ZOVIRAX) 400 MG tablet, Take 400 mg by mouth 2 (two) times daily., Disp: , Rfl:  .  albuterol (PROAIR HFA) 108 (90 BASE) MCG/ACT inhaler, 2 puffs as needed, Disp: , Rfl:  .  carboxymethylcellulose (REFRESH PLUS) 0.5 % SOLN, Place 2 drops into both eyes 4 (four) times daily. , Disp: , Rfl:  .  carvedilol (COREG) 3.125 MG tablet, Take 1 tablet (3.125 mg total) by mouth 2 (two) times daily., Disp: 180 tablet, Rfl: 3 .  carvedilol (COREG) 6.25 MG tablet, Take 1 tablet (6.25 mg total) by mouth 2 (two) times daily., Disp: 180 tablet, Rfl: 3 .  Cholecalciferol (VITAMIN D3) 1000 UNITS CAPS, Take by mouth daily.  , Disp: , Rfl:  .  glucosamine-chondroitin 500-400 MG tablet, Take 1 tablet by mouth 2 (two) times daily. , Disp: , Rfl:  .  levothyroxine (SYNTHROID, LEVOTHROID) 25 MCG tablet, Take 1 tablet (25 mcg total) by mouth daily before breakfast., Disp: 30 tablet, Rfl: 11 .  lisinopril (  PRINIVIL,ZESTRIL) 2.5 MG tablet, Take 1 tablet (2.5 mg total) by mouth 2 (two) times daily., Disp: 90 tablet, Rfl: 3 .  loratadine (CLARITIN) 10 MG tablet, Take 10 mg by mouth daily., Disp: , Rfl:  .  NON FORMULARY, Allergy injections , Disp: , Rfl:  .  pantoprazole (PROTONIX) 20 MG tablet, Take 1 tablet (20 mg total) by mouth 2 (two) times daily., Disp: 60 tablet, Rfl: 3  Review of Systems Denies any cough, sputum, fevers, chills. Denies any dyspnea,  wheezing. Denies any chest pain, palpitations. Denies any nausea, vomiting, diarrhea. All other review of systems negative.    Objective:   Physical Exam Blood pressure 134/72, pulse 58, temperature 96.7 F (35.9 C), temperature source Oral, height 5\' 4"  (1.626 m), weight 133 lb 9.6 oz (60.601 kg), SpO2 100 %.  Constitutional: She is oriented to person, place, and time. She appears well-developed and well-nourished.  HENT:  Mouth/Throat: Oropharynx is clear and moist.  Eyes: Pupils are equal, round, and reactive to light.  Neck: Normal range of motion. Neck supple.  Cardiovascular: Normal rate, regular rhythm and normal heart sounds.  Pulmonary/Chest: Effort normal and breath sounds normal.  Abdominal: Soft. Bowel sounds are normal.  Musculoskeletal: Normal range of motion.  Neurological: She is alert and oriented to person, place, and time.  Skin: Skin is warm and dry.     Assessment & Plan:  Chronic cough This may be secondary to asthma/reactive airway disease. Her severe allergies, postnasal drip and GERD is likely contributing to her symptoms. Her symptoms started one year ago approximately when lisinopril was started. But were exacerbated after recent viral infection.  She has not tolerated Dulera, Advair, Flonase. But appears to have responded to an increased dose of PPI. She was also given her infusion of IVIG by Dr. Jana Hakim this week which will help with her recurrent upper respiratory tract infections. If her symptoms return then I will discuss with her primary care and cardiology if she can be trialed off lisinopril. We can also consider an ENT consult for evaluation of laryngeal pharyngeal reflux.  I will schedule her for primary function tests.  Plan: - Continue nasalcrom, Albuterol prn and PPI bid. - Pulmonary function test.  Return to clinic in 6 months.   Marshell Garfinkel MD [facility] Pager [phone] If no answer or after 3pm call:  [phone] 11/28/2014, 2:05 PM

## 2014-12-05 ENCOUNTER — Other Ambulatory Visit: Payer: Self-pay | Admitting: Cardiology

## 2015-01-13 ENCOUNTER — Ambulatory Visit (INDEPENDENT_AMBULATORY_CARE_PROVIDER_SITE_OTHER): Payer: Medicare Other | Admitting: Cardiology

## 2015-01-13 ENCOUNTER — Encounter: Payer: Self-pay | Admitting: Cardiology

## 2015-01-13 VITALS — BP 120/74 | HR 94 | Ht 64.0 in | Wt 133.0 lb

## 2015-01-13 DIAGNOSIS — R001 Bradycardia, unspecified: Secondary | ICD-10-CM | POA: Diagnosis not present

## 2015-01-13 DIAGNOSIS — R0602 Shortness of breath: Secondary | ICD-10-CM | POA: Diagnosis not present

## 2015-01-13 MED ORDER — LOSARTAN POTASSIUM 50 MG PO TABS: 50.0000 mg | ORAL_TABLET | Freq: Every day | ORAL | Status: DC

## 2015-01-13 NOTE — Patient Instructions (Signed)
Your physician wants you to follow-up in: 4 Months. You will receive a reminder letter in the mail two months in advance. If you don't receive a letter, please call our office to schedule the follow-up appointment.  Your physician has recommended you make the following change in your medication: STOP Lisinopril and Losartan 50 mg daily.  Your physician recommends that you return for lab work in: BNP   Merry Christmas and [place]!!

## 2015-01-13 NOTE — Progress Notes (Signed)
HPI The patient presents for follow up of cardiomyopathy. She has been noted to have an ejection fraction of 30%. This was brought to attention after she was found to have frequent premature ventricular contractions. Cardiac catheterization demonstrated nonobstructive and small vessel disease.  After the last visit she had an echo and her EF was improved to 45%.  She continues to have occasional palpitations.  However, these are not particularly symptomatic.  She denies chest pain, pre syncope or syncope.  She has had cough and some SOB.  She has seen a pulmonologist.  She also reports a chronic viral issues with URI like symptoms that did improve somewhat after recent IVIG infusion.    Allergies  Allergen Reactions  . Acetaminophen     REACTION: high dose - tremor  . Alendronate Sodium     Stomach upset.   . Contrast Media [Iodinated Diagnostic Agents]     Syncope.   . Diflunisal     Stomach pain.    . Doxycycline     n/v  . Flexeril [Cyclobenzaprine]     Abdominal pain.    . Nabumetone     Abdominal pain; dark, tarry stool.    . Oxybutynin     Pt does not remember.    . Raloxifene     Unknown.    . Sulfa Antibiotics Nausea Only    Syncope.    . Symbicort [Budesonide-Formoterol Fumarate]     "Jittery-ness."   . Tramadol Hcl     Numbness.    . Diclofenac Palpitations    Dark, tarry stool.      Current Outpatient Prescriptions  Medication Sig Dispense Refill  . albuterol (PROAIR HFA) 108 (90 BASE) MCG/ACT inhaler 2 puffs as needed    . carboxymethylcellulose (REFRESH PLUS) 0.5 % SOLN Place 2 drops into both eyes 4 (four) times daily.     . carvedilol (COREG) 3.125 MG tablet Take 1 tablet (3.125 mg total) by mouth 2 (two) times daily. 180 tablet 3  . carvedilol (COREG) 6.25 MG tablet TAKE 1 TABLET BY MOUTH 2 TIMES DAILY. 180 tablet 1  . Cholecalciferol (VITAMIN D3) 1000 UNITS CAPS Take by mouth daily.      . fluticasone (FLONASE) 50 MCG/ACT nasal spray Place 2 sprays into  both nostrils daily.    Marland Kitchen glucosamine-chondroitin 500-400 MG tablet Take 1 tablet by mouth 2 (two) times daily.     Marland Kitchen levothyroxine (SYNTHROID, LEVOTHROID) 25 MCG tablet Take 1 tablet (25 mcg total) by mouth daily before breakfast. 30 tablet 11  . lisinopril (PRINIVIL,ZESTRIL) 2.5 MG tablet Take 1 tablet (2.5 mg total) by mouth 2 (two) times daily. 90 tablet 3  . loratadine (CLARITIN) 10 MG tablet Take 10 mg by mouth daily.    . NON FORMULARY Allergy injections     . pantoprazole (PROTONIX) 20 MG tablet Take 1 tablet (20 mg total) by mouth 2 (two) times daily. 60 tablet 3   No current facility-administered medications for this visit.    Past Medical History  Diagnosis Date  . HYPOTHYROIDISM 08/29/2006  . 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 GERD 08/29/2006  . HEPATIC CYST 08/02/2007  . OSTEOARTHRITIS 08/29/2006  . LOW BACK PAIN 08/29/2006  . OSTEOPENIA 11/19/2008  . PARESTHESIA 03/05/2009  . LIVER HEMANGIOMA 03/03/2010  . 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 MUSCLE SPASM 03/03/2010  . DEPRESSION 08/29/2006  . Diverticulosis   . Scoliosis   . CAD (coronary artery disease)     Nonobstructive. LAD  30% stenosis, small PDA of the circumflex 70% stenosis, right coronary artery 40% stenosis. Catheterization September 2015  . Nonischemic cardiomyopathy (HCC)     EF 30%  . PVC's (premature ventricular contractions)   . Hypertension   . Allergy     Past Surgical History  Procedure Laterality Date  . Tonsillectomy    . Abdominal hysterectomy    . Appendectomy    . Carpal tunnel release      both  . Bunionectomy      both  . Back surgery  2010,2006    lumb fusion  . Cataract extraction      both  . Shoulder arthroscopy  02/09/2011    Procedure: ARTHROSCOPY SHOULDER;  Surgeon: Hessie Dibble, MD;  Location: [place];  Service: Orthopedics;  Laterality: Right;  right shoulder arthroscopy subacromial decompression with rotator cuff repair  . Left and right heart  catheterization with coronary angiogram N/A 10/04/2013    Procedure: LEFT AND RIGHT HEART CATHETERIZATION WITH CORONARY ANGIOGRAM;  Surgeon: Leonie Man, MD;  Location: [facility] CATH LAB;  Service: Cardiovascular;  Laterality: N/A;    ROS:  Mild cough and congestion chronic and improved.  Otherwise as stated in the HPI and negative for all other systems.   PHYSICAL EXAM BP 120/74 mmHg  Pulse 94  Ht 5\' 4"  (1.626 m)  Wt 133 lb (60.328 kg)  BMI 22.82 kg/m2 GENERAL:  Well appearing NECK:  No jugular venous distention, waveform within normal limits, carotid upstroke brisk and symmetric, no bruits, no thyromegaly LUNGS:  Clear to auscultation bilaterally BACK:  No CVA tenderness CHEST:  Unremarkable HEART:  PMI not displaced or sustained,S1 and S2 within normal limits, no S3, positive S4, no clicks, no rubs, no murmurs, irregular ABD:  Flat, positive bowel sounds normal in frequency in pitch, no bruits, no rebound, no guarding, no midline pulsatile mass, no hepatomegaly, no splenomegaly EXT:  2 plus pulses throughout, no edema, no cyanosis no clubbing  EKG:  NSR, rate 94, frequent ventricular ectopy.  Nonspecific ST T wave changes.  No change from previous. 01/13/2015   ASSESSMENT AND PLAN  CARDIOMYPATHY:  With her frequent cough I am going to change to Cozaar.  I did review the pulmonary notes.  No further imaging is indicated at this point.  I will also check a BNP level.    HTN: This is being managed in the context of treating her CHF  PVCs:   These are not particularly symptomatic.  No change in therapy is planned.

## 2015-01-14 LAB — BRAIN NATRIURETIC PEPTIDE: Brain Natriuretic Peptide: 192.6 pg/mL — ABNORMAL HIGH (ref 0.0–100.0)

## 2015-01-21 ENCOUNTER — Telehealth: Payer: Self-pay | Admitting: *Deleted

## 2015-01-21 NOTE — Telephone Encounter (Signed)
Pt calling back [phone]

## 2015-01-21 NOTE — Telephone Encounter (Signed)
-----   Message from Marshell Garfinkel, MD sent at 01/15/2015  5:21 PM EST ----- Thanks for the update. I am not sure why PFTs were cancelled. I will look into it.  Janett Billow- can you check and re schedule her PFTs.  Thanks PM  ----- Message -----    From: Minus Breeding, MD    Sent: 01/13/2015  12:20 PM      To: Marshell Garfinkel, MD  Hi,  I am going to change her to an ARB to see if her cough gets better.  She was wondering if she needed the PFTs.  She says that they were cancelled.  Thanks.  [employer]

## 2015-01-21 NOTE — Telephone Encounter (Signed)
LMOMTCB x1 for pt 

## 2015-01-21 NOTE — Telephone Encounter (Signed)
Ok to keep the April appointment.

## 2015-01-21 NOTE — Telephone Encounter (Signed)
Spoke with pt, aware of below recs.  Nothing further needed.  

## 2015-01-21 NOTE — Telephone Encounter (Signed)
Pt's PFT was not cancelled, but was placed on recall list to be scheduled with her 6 month follow up w/ PM Discussed with PM and PFT needs to be scheduled for first available  Called spoke with patient to discuss.  PFT scheduled for 12.28.16 @ 12N.  Guidelines discussed with pt as well and she voiced her understanding.  Pt did state that she received a letter asking her to call to schedule the April 2017 PFT and follow up.  OV w/ PM scheduled for 4.13.17 @ 1330.  Advised pt will ask PM if she needs to be seen sooner than April 2017 and call her back this afternoon.  Dr Vaughan Browner please advise if you would like to see pt prior to April 2017.  You have no openings at this point until after the new year.  Thanks.

## 2015-01-29 ENCOUNTER — Other Ambulatory Visit: Payer: Self-pay | Admitting: Nurse Practitioner

## 2015-01-29 ENCOUNTER — Ambulatory Visit (INDEPENDENT_AMBULATORY_CARE_PROVIDER_SITE_OTHER): Payer: Medicare Other | Admitting: Pulmonary Disease

## 2015-01-29 DIAGNOSIS — R05 Cough: Secondary | ICD-10-CM

## 2015-01-29 DIAGNOSIS — R053 Chronic cough: Secondary | ICD-10-CM

## 2015-01-29 LAB — PULMONARY FUNCTION TEST
DL/VA % pred: 77 %
DL/VA: 3.73 ml/min/mmHg/L
DLCO unc % pred: 69 %
DLCO unc: 16.89 ml/min/mmHg
FEF 25-75 Post: 1.11 L/s
FEF 25-75 Pre: 1.57 L/s
FEF2575-%Change-Post: -28 %
FEF2575-%Pred-Post: 66 %
FEF2575-%Pred-Pre: 93 %
FEV1-%Change-Post: -7 %
FEV1-%Pred-Post: 81 %
FEV1-%Pred-Pre: 88 %
FEV1-Post: 1.73 L
FEV1-Pre: 1.88 L
FEV1FVC-%Change-Post: -1 %
FEV1FVC-%Pred-Pre: 94 %
FEV6-%Change-Post: -10 %
FEV6-%Pred-Post: 85 %
FEV6-%Pred-Pre: 95 %
FEV6-Post: 2.29 L
FEV6-Pre: 2.56 L
FEV6FVC-%Pred-Post: 105 %
FEV6FVC-%Pred-Pre: 105 %
FVC-%Change-Post: -6 %
FVC-%Pred-Post: 87 %
FVC-%Pred-Pre: 93 %
FVC-Post: 2.46 L
FVC-Pre: 2.64 L
Post FEV1/FVC ratio: 70 %
Post FEV6/FVC ratio: 100 %
Pre FEV1/FVC ratio: 71 %
Pre FEV6/FVC Ratio: 100 %

## 2015-01-29 NOTE — Progress Notes (Signed)
PFT performed today. 

## 2015-02-03 ENCOUNTER — Other Ambulatory Visit: Payer: Self-pay | Admitting: Cardiology

## 2015-02-04 NOTE — Telephone Encounter (Signed)
Rx request sent to pharmacy.  

## 2015-02-10 ENCOUNTER — Other Ambulatory Visit: Payer: Self-pay | Admitting: Cardiology

## 2015-02-14 ENCOUNTER — Telehealth: Payer: Self-pay | Admitting: Pulmonary Disease

## 2015-02-14 NOTE — Telephone Encounter (Signed)
Please let her know that there is mild reduction in lung function. No need to change current treatment. We will discuss further at next visit.

## 2015-02-14 NOTE — Telephone Encounter (Signed)
Spoke with pt, calling for PFT results.  pft was performed on 01/29/15.   PM please advise on results.  Thanks!

## 2015-02-14 NOTE — Telephone Encounter (Signed)
Pt is aware of results. Nothing further was needed. 

## 2015-04-07 LAB — BASIC METABOLIC PANEL
BUN: 18 (ref 4–21)
Creatinine: 0.9 (ref 0.5–1.1)
Glucose: 81
Potassium: 5.1 (ref 3.4–5.3)
Sodium: 136 — AB (ref 137–147)

## 2015-05-08 ENCOUNTER — Ambulatory Visit (INDEPENDENT_AMBULATORY_CARE_PROVIDER_SITE_OTHER): Payer: Medicare Other | Admitting: Cardiology

## 2015-05-08 ENCOUNTER — Encounter: Payer: Self-pay | Admitting: Cardiology

## 2015-05-08 VITALS — BP 126/80 | HR 49 | Ht 64.0 in | Wt 133.2 lb

## 2015-05-08 DIAGNOSIS — I429 Cardiomyopathy, unspecified: Secondary | ICD-10-CM | POA: Diagnosis not present

## 2015-05-08 DIAGNOSIS — I420 Dilated cardiomyopathy: Secondary | ICD-10-CM | POA: Diagnosis not present

## 2015-05-08 DIAGNOSIS — I428 Other cardiomyopathies: Secondary | ICD-10-CM

## 2015-05-08 MED ORDER — CARVEDILOL 3.125 MG PO TABS: ORAL_TABLET | ORAL | Status: DC

## 2015-05-08 NOTE — Patient Instructions (Signed)
NO CHANGE WITH CURRENT MEDICATIONS  Refilled 3.125 mg dose.   Your physician has requested that you have an echocardiogram - in 12 months at [address] [facility]  [address]. Echocardiography is a painless test that uses sound waves to create images of your heart. It provides your doctor with information about the size and shape of your heart and how well your heart's chambers and valves are working. This procedure takes approximately one hour. There are no restrictions for this procedure.  If you need a refill on your cardiac medications before your next appointment, please call your pharmacy.

## 2015-05-08 NOTE — Progress Notes (Signed)
HPI The patient presents for follow up of cardiomyopathy. She has been noted to have an ejection fraction of 30%. This was brought to attention after she was found to have frequent premature ventricular contractions. Cardiac catheterization demonstrated nonobstructive and small vessel disease.  In March of last year her EF was improved to 45%.  She continues to have occasional palpitations.  However, these are not particularly symptomatic.  She denies chest pain, pre syncope or syncope.  At the last visit she seemed to be having a lot of viral URI symptoms. I did switch her from ACE inhibitor to Cozaar. She says she doesn't have cough or shortness of breath any longer. She thinks she is doing relatively well.  Of note she had a very mildly elevated BNP at that visit but it made no adjustment to her diuretics.     Allergies  Allergen Reactions  . Acetaminophen     REACTION: high dose - tremor  . Alendronate Sodium     Stomach upset.   . Contrast Media [Iodinated Diagnostic Agents]     Syncope.   . Diflunisal     Stomach pain.    . Doxycycline     n/v  . Flexeril [Cyclobenzaprine]     Abdominal pain.    . Nabumetone     Abdominal pain; dark, tarry stool.    . Oxybutynin     Pt does not remember.    . Raloxifene     Unknown.    . Sulfa Antibiotics Nausea Only    Syncope.    . Symbicort [Budesonide-Formoterol Fumarate]     "Jittery-ness."   . Tramadol Hcl     Numbness.    . Diclofenac Palpitations    Dark, tarry stool.      Current Outpatient Prescriptions  Medication Sig Dispense Refill  . albuterol (PROAIR HFA) 108 (90 BASE) MCG/ACT inhaler 2 puffs as needed    . carboxymethylcellulose (REFRESH PLUS) 0.5 % SOLN Place 2 drops into both eyes 4 (four) times daily.     . carvedilol (COREG) 3.125 MG tablet TAKE 1 TABLET BY MOUTH 2 TIMES DAILY. 180 tablet 0  . carvedilol (COREG) 6.25 MG tablet Take 6.25 mg by mouth daily.  2  . Cholecalciferol (VITAMIN D3) 1000 UNITS CAPS Take  by mouth daily.      . cromolyn (NASALCROM) 5.2 MG/ACT nasal spray Place 1 spray into both nostrils 4 (four) times daily.    Marland Kitchen glucosamine-chondroitin 500-400 MG tablet Take 1 tablet by mouth 2 (two) times daily.     Marland Kitchen levothyroxine (SYNTHROID, LEVOTHROID) 25 MCG tablet Take 1 tablet (25 mcg total) by mouth daily before breakfast. 30 tablet 11  . loratadine (CLARITIN) 10 MG tablet Take 10 mg by mouth daily.    Marland Kitchen losartan (COZAAR) 50 MG tablet Take 1 tablet (50 mg total) by mouth daily. 90 tablet 3  . NON FORMULARY Allergy injections     . pantoprazole (PROTONIX) 20 MG tablet Take 1 tablet (20 mg total) by mouth 2 (two) times daily. 60 tablet 3  . sucralfate (CARAFATE) 1 g tablet Take 1 g by mouth daily.     No current facility-administered medications for this visit.    Past Medical History  Diagnosis Date  . HYPOTHYROIDISM 08/29/2006  . 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 GERD 08/29/2006  . HEPATIC CYST 08/02/2007  . OSTEOARTHRITIS 08/29/2006  . LOW BACK PAIN 08/29/2006  . OSTEOPENIA 11/19/2008  . PARESTHESIA 03/05/2009  . LIVER  HEMANGIOMA 03/03/2010  . 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 MUSCLE SPASM 03/03/2010  . DEPRESSION 08/29/2006  . Diverticulosis   . Scoliosis   . CAD (coronary artery disease)     Nonobstructive. LAD 30% stenosis, small PDA of the circumflex 70% stenosis, right coronary artery 40% stenosis. Catheterization September 2015  . Nonischemic cardiomyopathy (HCC)     EF 30%  . PVC's (premature ventricular contractions)   . Hypertension   . Allergy     Past Surgical History  Procedure Laterality Date  . Tonsillectomy    . Abdominal hysterectomy    . Appendectomy    . Carpal tunnel release      both  . Bunionectomy      both  . Back surgery  2010,2006    lumb fusion  . Cataract extraction      both  . Shoulder arthroscopy  02/09/2011    Procedure: ARTHROSCOPY SHOULDER;  Surgeon: Hessie Dibble, MD;  Location: [place];  Service:  Orthopedics;  Laterality: Right;  right shoulder arthroscopy subacromial decompression with rotator cuff repair  . Left and right heart catheterization with coronary angiogram N/A 10/04/2013    Procedure: LEFT AND RIGHT HEART CATHETERIZATION WITH CORONARY ANGIOGRAM;  Surgeon: Leonie Man, MD;  Location[facility] CATH LAB;  Service: Cardiovascular;  Laterality: N/A;    ROS:  As stated in the HPI and negative for all other systems.   PHYSICAL EXAM BP 126/80 mmHg  Pulse 49  Ht 5\' 4"  (1.626 m)  Wt 133 lb 3.2 oz (60.419 kg)  BMI 22.85 kg/m2  SpO2 96% GENERAL:  Well appearing NECK:  No jugular venous distention, waveform within normal limits, carotid upstroke brisk and symmetric, no bruits, no thyromegaly LUNGS:  Clear to auscultation bilaterally BACK:  No CVA tenderness CHEST:  Unremarkable HEART:  PMI not displaced or sustained,S1 and S2 within normal limits, no S3, positive S4, no clicks, no rubs, no murmurs, irregular ABD:  Flat, positive bowel sounds normal in frequency in pitch, no bruits, no rebound, no guarding, no midline pulsatile mass, no hepatomegaly, no splenomegaly EXT:  2 plus pulses throughout, no edema, no cyanosis no clubbing  EKG:  NSR, rate 90, frequent ventricular ectopy.  Nonspecific ST T wave changes.  No change from previous. 05/08/2015   ASSESSMENT AND PLAN  CARDIOMYPATHY:    She seems to be euvolemic. She's tolerating the meds as listed. I don't think her heart rate which sometimes runs low with tolerate the beta blocker increased. She will remain on the meds as listed.  HTN: This is being managed in the context of treating her CHF  PVCs:   These are not particularly symptomatic.  No change in therapy is planned.

## 2015-05-15 ENCOUNTER — Ambulatory Visit (INDEPENDENT_AMBULATORY_CARE_PROVIDER_SITE_OTHER): Payer: Medicare Other | Admitting: Pulmonary Disease

## 2015-05-15 VITALS — BP 128/78 | HR 57

## 2015-05-15 DIAGNOSIS — R053 Chronic cough: Secondary | ICD-10-CM

## 2015-05-15 DIAGNOSIS — R05 Cough: Secondary | ICD-10-CM | POA: Diagnosis not present

## 2015-05-16 ENCOUNTER — Encounter: Payer: Self-pay | Admitting: Pulmonary Disease

## 2015-05-16 NOTE — Patient Instructions (Signed)
Return as needed

## 2015-05-16 NOTE — Progress Notes (Signed)
Subjective:    Patient ID: Kimberly Merritt, female    DOB: 03-01-1940, 75 y.o.   MRN: [id number]   PROBLEM LIST: Chronic cough. 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 has history of severe allergies. She complains of chronic nonproductive cough which started approximately a year ago. Symptoms were exacerbated after URTI. She saw her primary care doctor who prescribed a decongestant and cough suppressant. But the cough persisted. She was also tried on Qvar inhaler. She is also on albuterol when necessary but cannot say if it helps with the cough. She tried it only once but stopped because of a spell of "bronchial spasm" and coughing. She has no dyspnea except during prolonged spells of coughing. No complaints of wheezing mucus production fevers chills or hemoptysis.   She has severe allergies to ragweed, grass, tress, household dust, mold, cat dander. She sees Dr. Donneta Romberg for this and gets allergy shots once a month. She also has nasal drip. She has GERD and is on Protonix.  Interim History: She was tried on multiple inhalers including Dulera and Advair but she did not notice any difference. She has stopped the inhalers as it gave her oral thrush in spite of rinsing her mouth periodically. She also tried Flonase but that caused nasal congestion so she stopped that as well. She is continuing to take her PPI twice a day. She has been taken off lisnopril and reports that her dry hacking cough has improved significantly.  She was seen at hematology Dr. Jana Hakim for follow-up of her low-grade myelo proliferative disorder, hypogammaglobulinemia and was given a single infusion of IVIG on 10/24.  PFTs 01/29/15 FVC 2.64 (93%) FEV1 1.88 (80%) F/F 71 TLC 64% DLCO 69% Mild restriction, mild diffusion defect which corrects for alveolar volume.  Past Medical History  Diagnosis Date  . HYPOTHYROIDISM 08/29/2006  .  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 GERD 08/29/2006  . HEPATIC CYST 08/02/2007  . OSTEOARTHRITIS 08/29/2006  . LOW BACK PAIN 08/29/2006  . OSTEOPENIA 11/19/2008  . PARESTHESIA 03/05/2009  . LIVER HEMANGIOMA 03/03/2010  . 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 MUSCLE SPASM 03/03/2010  . DEPRESSION 08/29/2006  . Diverticulosis   . Scoliosis   . CAD (coronary artery disease)     Nonobstructive. LAD 30% stenosis, small PDA of the circumflex 70% stenosis, right coronary artery 40% stenosis. Catheterization September 2015  . Nonischemic cardiomyopathy (HCC)     EF 30%  . PVC's (premature ventricular contractions)   . Hypertension   . Allergy     Current outpatient prescriptions:  .  albuterol (PROAIR HFA) 108 (90 BASE) MCG/ACT inhaler, 2 puffs as needed, Disp: , Rfl:  .  carboxymethylcellulose (REFRESH PLUS) 0.5 % SOLN, Place 2 drops into both eyes 4 (four) times daily. , Disp: , Rfl:  .  carvedilol (COREG) 3.125 MG tablet, TAKE 1 TABLET BY MOUTH 2 TIMES DAILY., Disp: 180 tablet, Rfl: 3 .  carvedilol (COREG) 6.25 MG tablet, Take 6.25 mg by mouth daily., Disp: , Rfl: 2 .  Cholecalciferol (VITAMIN D3) 1000 UNITS CAPS, Take by mouth daily.  , Disp: , Rfl:  .  cromolyn (NASALCROM) 5.2 MG/ACT nasal spray, Place 1 spray into both nostrils 4 (four) times daily., Disp: , Rfl:  .  glucosamine-chondroitin 500-400 MG tablet, Take 1 tablet by mouth 2 (two) times daily. , Disp: , Rfl:  .  levothyroxine (SYNTHROID, LEVOTHROID) 25 MCG tablet, Take 1 tablet (25 mcg total)  by mouth daily before breakfast., Disp: 30 tablet, Rfl: 11 .  loratadine (CLARITIN) 10 MG tablet, Take 10 mg by mouth daily., Disp: , Rfl:  .  losartan (COZAAR) 50 MG tablet, Take 1 tablet (50 mg total) by mouth daily., Disp: 90 tablet, Rfl: 3 .  NON FORMULARY, Allergy injections , Disp: , Rfl:  .  pantoprazole (PROTONIX) 20 MG tablet, Take 1 tablet (20 mg total) by mouth 2 (two) times daily., Disp: 60 tablet, Rfl: 3 .  sucralfate  (CARAFATE) 1 g tablet, Take 1 g by mouth daily., Disp: , Rfl:   Review of Systems Denies any cough, sputum, fevers, chills. Denies any dyspnea, wheezing. Denies any chest pain, palpitations. Denies any nausea, vomiting, diarrhea. All other review of systems negative.    Objective:   Physical Exam Blood pressure 128/78, pulse 57, SpO2 98 %. Constitutional: She is oriented to person, place, and time. She appears well-developed and well-nourished.  HENT:  Mouth/Throat: Oropharynx is clear and moist.  Eyes: Pupils are equal, round, and reactive to light.  Neck: Normal range of motion. Neck supple.  Cardiovascular: Normal rate, regular rhythm and normal heart sounds.  Pulmonary/Chest: Effort normal and breath sounds normal.  Abdominal: Soft. Bowel sounds are normal.  Musculoskeletal: Normal range of motion.  Neurological: She is alert and oriented to person, place, and time.  Skin: Skin is warm and dry.     Assessment & Plan:  Chronic cough Likely secondary to lisinopril, severe allergies, postnasal drip and GERD. Her symptoms started one year ago approximately when lisinopril was started but have improved now after she was taken off the ACEI. I reviewed her PFTs with her. It shows some mild restriction which I think is secondary to posture and scoliosis. She does not have any interstitial lung disease on prior chest imaging.   She has not tolerated Dulera, Advair, Flonase. She was also given an infusion of IVIG for hypogammaglobulinemia which will help with her recurrent upper respiratory tract infections.   I do not believe she has any significant lung issues at present. She will follow-up with [place] as needed if her symptoms worsen.  Marshell Garfinkel MD [facility] Pager [phone] If no answer or after 3pm call: [phone] 05/16/2015, 6:17 AM

## 2015-09-05 ENCOUNTER — Other Ambulatory Visit: Payer: Self-pay | Admitting: Cardiology

## 2015-09-05 ENCOUNTER — Telehealth: Payer: Self-pay

## 2015-09-05 ENCOUNTER — Other Ambulatory Visit: Payer: Self-pay | Admitting: Family Medicine

## 2015-09-05 DIAGNOSIS — Z1231 Encounter for screening mammogram for malignant neoplasm of breast: Secondary | ICD-10-CM

## 2015-09-05 NOTE — Telephone Encounter (Signed)
Follow-up     The pt is retuning the tech call

## 2015-09-05 NOTE — Telephone Encounter (Signed)
Called patient to verify correct dose on Coreg, chart show 3.125mg  as well aws 6.25mg . I want to get the correct dose and fill medication accordingly. LMTCB.

## 2015-09-08 ENCOUNTER — Other Ambulatory Visit: Payer: Self-pay | Admitting: *Deleted

## 2015-09-08 MED ORDER — CARVEDILOL 3.125 MG PO TABS: ORAL_TABLET | ORAL | 3 refills | Status: DC

## 2015-09-08 MED ORDER — CARVEDILOL 6.25 MG PO TABS: 6.2500 mg | ORAL_TABLET | Freq: Two times a day (BID) | ORAL | 3 refills | Status: DC

## 2015-09-08 NOTE — Telephone Encounter (Signed)
Patient called in about getting a refill on her Coreg 3.125mg  BID and 6.25mg  BID. She stated that per Dr. Warren Lacy she takes both to get the dosage 9.375mg  BID. Refill 90 day supply for her.

## 2015-10-08 ENCOUNTER — Telehealth: Payer: Self-pay | Admitting: Cardiology

## 2015-10-08 MED ORDER — LOSARTAN POTASSIUM 50 MG PO TABS: 25.0000 mg | ORAL_TABLET | Freq: Every day | ORAL | 3 refills | Status: DC

## 2015-10-08 NOTE — Telephone Encounter (Signed)
Kimberly Merritt is calling because her blood pressure has been low and she has been very tired and weak . This has been going on since the beginning of the month . This morning her bp has been 108/67, prior to that it was 99/69 and got to 106/55. Please call

## 2015-10-08 NOTE — Telephone Encounter (Signed)
If her systolic is [id number], have her cut losartan from 50 to 25 mg daily.  Continue with home BP checks

## 2015-10-08 NOTE — Telephone Encounter (Signed)
Pt instructed to cut losartan, take 1/2 tablet daily. Advised to log BPs, call in 1.5-2 weeks w/update. Note any concerns. Let us know if refill needs to be sent for whole 25mg  tab (if difficult to cut 50mg  tabs).  She voiced understanding of and thanks for recommendations.

## 2015-10-08 NOTE — Telephone Encounter (Signed)
Returned call. Patient notes BPs as recorded below, notes typically high 90s to low 100s.  I saw documentation that we attempted to clarify her coreg dose approx 1 month ago.  She reports she is and has been taking a total of 9.375mg  BID (taking the 6.25mg  and 3.125mg  doses concurrently BID)  I have made her aware of the reporting discrepancy, but she states she has been on this dose for quite a while. Informed her this could explain fatigue, though I am not sure, but could check to see if appropriate to reduce dose considering her fatigue symptoms.  I also noted she takes synthroid. We do not have a recent TSH level. She sees Dr. Marisue Humble who follows this. She has an upcoming appt at their office next week.  I advised to see if they can check her thyroid function at that appt to monitor appropriate medical therapy.  Pt aware I will defer to pharmD for recommendations on med adjustment.

## 2015-10-10 LAB — BASIC METABOLIC PANEL
BUN: 22 — AB (ref 4–21)
Creatinine: 0.8 (ref 0.5–1.1)
Glucose: 88
Potassium: 4.8 (ref 3.4–5.3)
Sodium: 133 — AB (ref 137–147)

## 2015-10-10 LAB — HEPATIC FUNCTION PANEL
ALT: 21 (ref 7–35)
AST: 21 (ref 13–35)
Alkaline Phosphatase: 79 (ref 25–125)
Bilirubin, Total: 0.5

## 2015-10-10 LAB — CBC AND DIFFERENTIAL
HCT: 39 (ref 36–46)
Hemoglobin: 13.2 (ref 12.0–16.0)
Platelets: 292 (ref 150–399)
WBC: 9.8

## 2015-10-10 LAB — VITAMIN D 25 HYDROXY (VIT D DEFICIENCY, FRACTURES): Vit D, 25-Hydroxy: 38.3

## 2015-10-20 ENCOUNTER — Ambulatory Visit
Admission: RE | Admit: 2015-10-20 | Discharge: 2015-10-20 | Disposition: A | Discharge status: Home / Self Care | Payer: Medicare Other | Source: Ambulatory Visit | Attending: Family Medicine | Admitting: Family Medicine

## 2015-10-20 DIAGNOSIS — Z1231 Encounter for screening mammogram for malignant neoplasm of breast: Secondary | ICD-10-CM

## 2015-10-21 ENCOUNTER — Telehealth: Payer: Self-pay | Admitting: Cardiology

## 2015-10-21 NOTE — Telephone Encounter (Signed)
Returned call to patient-patient reports increase in BP since medication change and is concerned.   BP/HR readings:     AM (before medications) PM 9/7   128/62 53    9/8   141/69 108  9/9   121/68 57   9/10   120/76 107  110/56 51 9/11   112/61 55  119/62 52 9/12   118/58 54  136/69 49 9/13   116/65 53   9/14   128/67 75   9/15   135/84 109  122/68 54  9/16   129/65 57  108/59 61 9/17   147/70 109 9/18   134/64 108  119/62 58 9/19   126/67 108   51  Pt reports weakness is better but was told her BP did not need to be above [id number] systolic.  Advised her BP readings are very good and it is normal for BP to fluctuate.  Also reports she can tell when her HR has increased as she has palpitations and feels like her heart is going to "beat out of my chest".  Denies changes in other medications beside Losartan decrease on 9/7 (see telephone note).    Again, encouraged patient that BP readings are good and normal to fluctuate.  Pt continues to be concerned and would like MD Hochrein recommendations.    Advised I would send to him for review.  Pt verbalized understanding.

## 2015-10-21 NOTE — Telephone Encounter (Signed)
A change was made on her Losartan on 10-09-15. He blood pressure is starting to elevate and her pulse have been running over a hundred sometimes.Pt is very concerned,please call to advise.

## 2015-10-22 NOTE — Telephone Encounter (Signed)
Follow up      Calling to follow up on bp problems. Did you hear back from Dr Percival Spanish?  Please call

## 2015-10-22 NOTE — Telephone Encounter (Signed)
She should take her BP 4 - 5 x per week.  HR probably reflects undercounting her ectopy.  No change in therapy.

## 2015-10-22 NOTE — Telephone Encounter (Signed)
Spoke w patient, recommendations given and she verbalized understanding. Advised no need to call unless new symptoms or BPs vary beyond recent values.

## 2015-10-22 NOTE — Telephone Encounter (Signed)
Called patient back. Have made her aware this has been sent to Dr. Percival Spanish for review. Noted that the BP variations seem OK - she states "I have many more that I can share". She's taking BP several times a day/often hourly, and I have encouraged her before and reminded her today that it is sufficient to take 1-2 readings per day.  She notes concern bc she was told her readings should below 120/70. She was concerned due to some of the higher systolic reads ([id number]) I have reminded her that high readings here or there are OK - we were trying to correct symptomatic low BPs 2 weeks ago when cozaar dose was cut, and she does note w med change these symptoms have resolved. Encouraged her in the fact that her avg pressures seem to be appropriate/WNR. I also reminded her that she should be checking BPs at rest. She voiced understanding.   Patient wanted also to follow up on HR concerns - notes sometimes she checks and she is bradycardic, and sometimes she has faster readings (variability between 50s and low 100s) She does not have a noted hx of A Fib and she is not describing any symptoms of fatigue or palpitations, but she is aware I will inquire on appropriate recommendations from Dr. Percival Spanish to see if we can address this as well.

## 2015-11-05 ENCOUNTER — Other Ambulatory Visit: Payer: Self-pay | Admitting: *Deleted

## 2015-11-05 DIAGNOSIS — D472 Monoclonal gammopathy: Secondary | ICD-10-CM

## 2015-11-06 ENCOUNTER — Other Ambulatory Visit (HOSPITAL_BASED_OUTPATIENT_CLINIC_OR_DEPARTMENT_OTHER): Payer: Medicare Other

## 2015-11-06 DIAGNOSIS — D801 Nonfamilial hypogammaglobulinemia: Secondary | ICD-10-CM | POA: Diagnosis not present

## 2015-11-06 DIAGNOSIS — D472 Monoclonal gammopathy: Secondary | ICD-10-CM | POA: Diagnosis not present

## 2015-11-06 LAB — CBC WITH DIFFERENTIAL/PLATELET
BASO%: 0.8 % (ref 0.0–2.0)
Basophils Absolute: 0.1 10*3/uL (ref 0.0–0.1)
EOS%: 1.4 % (ref 0.0–7.0)
Eosinophils Absolute: 0.1 10*3/uL (ref 0.0–0.5)
HCT: 40.4 % (ref 34.8–46.6)
HGB: 13.5 g/dL (ref 11.6–15.9)
LYMPH%: 35.2 % (ref 14.0–49.7)
MCH: 31 pg (ref 25.1–34.0)
MCHC: 33.4 g/dL (ref 31.5–36.0)
MCV: 92.7 fL (ref 79.5–101.0)
MONO#: 1.2 10*3/uL — ABNORMAL HIGH (ref 0.1–0.9)
MONO%: 15.7 % — ABNORMAL HIGH (ref 0.0–14.0)
NEUT#: 3.7 10*3/uL (ref 1.5–6.5)
NEUT%: 46.9 % (ref 38.4–76.8)
Platelets: 279 10*3/uL (ref 145–400)
RBC: 4.36 10*6/uL (ref 3.70–5.45)
RDW: 12.5 % (ref 11.2–14.5)
WBC: 7.9 10*3/uL (ref 3.9–10.3)
lymph#: 2.8 10*3/uL (ref 0.9–3.3)

## 2015-11-06 LAB — COMPREHENSIVE METABOLIC PANEL
ALT: 17 U/L (ref 0–55)
AST: 18 U/L (ref 5–34)
Albumin: 3.5 g/dL (ref 3.5–5.0)
Alkaline Phosphatase: 92 U/L (ref 40–150)
Anion Gap: 8 mEq/L (ref 3–11)
BUN: 16.4 mg/dL (ref 7.0–26.0)
CO2: 27 mEq/L (ref 22–29)
Calcium: 9.2 mg/dL (ref 8.4–10.4)
Chloride: 103 mEq/L (ref 98–109)
Creatinine: 0.9 mg/dL (ref 0.6–1.1)
EGFR: 60 mL/min/{1.73_m2} — ABNORMAL LOW (ref >90)
Glucose: 88 mg/dl (ref 70–140)
Potassium: 5.2 mEq/L — ABNORMAL HIGH (ref 3.5–5.1)
Sodium: 137 mEq/L (ref 136–145)
Total Bilirubin: 0.64 mg/dL (ref 0.20–1.20)
Total Protein: 6.8 g/dL (ref 6.4–8.3)

## 2015-11-06 LAB — LACTATE DEHYDROGENASE: LDH: 150 U/L (ref 125–245)

## 2015-11-07 LAB — IGG, IGA, IGM
IgA, Qn, Serum: 16 mg/dL — ABNORMAL LOW (ref 64–422)
IgG, Qn, Serum: 377 mg/dL — ABNORMAL LOW (ref 700–1600)
IgM, Qn, Serum: 1151 mg/dL — ABNORMAL HIGH (ref 26–217)

## 2015-11-07 LAB — KAPPA/LAMBDA LIGHT CHAINS
Ig Kappa Free Light Chain: 12 mg/L (ref 3.3–19.4)
Ig Lambda Free Light Chain: 6.2 mg/L (ref 5.7–26.3)
Kappa/Lambda FluidC Ratio: 1.94 — ABNORMAL HIGH (ref 0.26–1.65)

## 2015-11-10 LAB — PROTEIN ELECTROPHORESIS, SERUM
A/G Ratio: 1.2 (ref 0.7–1.7)
Albumin: 3.6 g/dL (ref 2.9–4.4)
Alpha 1: 0.2 g/dL (ref 0.0–0.4)
Alpha 2: 0.7 g/dL (ref 0.4–1.0)
Beta: 1.7 g/dL — ABNORMAL HIGH (ref 0.7–1.3)
Gamma Globulin: 0.3 g/dL — ABNORMAL LOW (ref 0.4–1.8)
Globulin, Total: 2.9 g/dL (ref 2.2–3.9)
M-Spike, %: 0.9 g/dL — ABNORMAL HIGH
Total Protein: 6.5 g/dL (ref 6.0–8.5)

## 2015-11-13 ENCOUNTER — Ambulatory Visit (HOSPITAL_BASED_OUTPATIENT_CLINIC_OR_DEPARTMENT_OTHER): Payer: Medicare Other | Admitting: Oncology

## 2015-11-13 VITALS — BP 131/68 | HR 64 | Temp 97.7°F | Resp 18 | Ht 64.0 in | Wt 131.7 lb

## 2015-11-13 DIAGNOSIS — D801 Nonfamilial hypogammaglobulinemia: Secondary | ICD-10-CM | POA: Diagnosis not present

## 2015-11-13 DIAGNOSIS — I429 Cardiomyopathy, unspecified: Secondary | ICD-10-CM

## 2015-11-13 DIAGNOSIS — M899 Disorder of bone, unspecified: Secondary | ICD-10-CM

## 2015-11-13 DIAGNOSIS — D472 Monoclonal gammopathy: Secondary | ICD-10-CM

## 2015-11-13 DIAGNOSIS — M949 Disorder of cartilage, unspecified: Principal | ICD-10-CM

## 2015-11-13 MED ORDER — ACYCLOVIR 400 MG PO TABS: 400.0000 mg | ORAL_TABLET | Freq: Two times a day (BID) | ORAL | 4 refills | Status: DC

## 2015-11-13 NOTE — Progress Notes (Signed)
[facility]  [phone] [phone]     ID: Kimberly Merritt DOB: 02/29/1940  MR#: [id number]  JME#:[id number]  Patient Care Team: Gaynelle Arabian, MD as PCP - General (Family Medicine) Melrose Nakayama, MD as Attending Physician (Orthopedic Surgery) Chauncey Cruel, MD (Hematology and Oncology) Druscilla Brownie, MD as Consulting Physician (Dermatology) Clarene Essex, MD (Gastroenterology) Rutherford Guys, MD as Attending Physician (Ophthalmology) Terrance Mass, MD (Obstetrics and Gynecology) Mal Misty, MD as Attending Physician (Vascular Surgery) Jovita Gamma, MD as Consulting Physician (Neurosurgery) Minus Breeding, MD as Consulting Physician (Cardiology) OTHER MD: Minus Breeding MD, Marshell Garfinkel MD  CHIEF COMPLAINT: M-GUS/ low-grade lymphoproliferative process  CURRENT TREATMENT: Observation   HISTORY PRESENT ILLNESS::  from the earlier summary note:  "Kimberly Merritt" Merritt is a 75 y/o [place] woman I evaluated originally in October of 2004 for a monoclonal gammopathy. At that time she had a white cell count of 7.2, hemoglobin 12.8, MCV 94.5, and platelets 298,000. Protein workup showed an IgM kappa paraprotein of 0.6 g, with a total IgG slightly depressed at 556, but a normal IgA at 82. Because an IgM paraprotein can be associated with a low grade lymphoproliferative process (Waldenstrm's macroglobulinemia) Kimberly Merritt underwent bone marrow biopsy 11/27/2002, which showed ([id number]) a normal cellular marrow with trilineage hematopoiesis and 5% plasma cells. The peripheral blood film showed no significant abnormalities and on the biopsy para-spicular lymphoid aggregates were not noted. 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  The patient was followed with observation alone through May of 2009, with no evidence of progression in her IgM kappa clone. As of 06/03/2007 her total IgM spike was 0.83, her IgG was 490  and her IgA was 36. The patient was released from followup here at that time.  More recently, as the 10 year anniversary of her original bone marrow biopsy past, Dr. Dorann Lodge of referred the patient back for reassessment and possible bone marrow rebiopsy. Kimberly Merritt was scheduled here for lab work 09/25/2013. However when she had her blood drawn she complained of some shortness of breath and her pulse was checked. It was 33. The patient was referred to the emergency room where she was evaluated by Dr. Percival Spanish who felt the patient's true heart rate was higher (she has ectopic beats which generated a week pulse and may have been missed) since in the emergency room EKG her rate was 98 with normal sinus rhythm. Troponins were negative. BNP was slightly elevated.  Dr. Percival Spanish set the patient up for an echocardiogram 09/27/2013 and this showed an ejection fraction of 30-35%. There was diffuse hypokinesis. She underwent right and left heart catheterization 10/04/2013 which showed no significant obstructions. The study did confirm an ejection fraction of roughly 35% with global hypokinesis. She is followed by Dr. Percival Spanish for her new diagnosis of non-ischemic cardiomyopathy.   Her subsequent history is as detailed below  Kimberly Merritt returns today for 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  From that point of view the interval history is generally unremarkable. There have been no intercurrent infections of any concern. There has been no adenopathy that she is aware of. There have been no intercurrent fevers or drenching sweats.  REVIEW OF SYSTEMS: She has significant congestive heart failure issues and this makes her very tired. Her blood pressure medications were recently decreased and this helped. Of course she has an irregular heartbeat, is short of breath when walking particularly walking up stairs, has an  occasional cough, heartburn, back and joint pain with significant  arthritis, for which she does not take any medication. Overall though aside from her slight decline in her functional status a detailed review of systems today was stable.  PAST MEDICAL HISTORY: Past Medical History:  Diagnosis Date  . Allergy   . CAD (coronary artery disease)    Nonobstructive. LAD 30% stenosis, small PDA of the circumflex 70% stenosis, right coronary artery 40% stenosis. Catheterization September 2015  . DEPRESSION 08/29/2006  . Diverticulosis   . GERD 08/29/2006  . HEPATIC CYST 08/02/2007  . Hypertension   . HYPOTHYROIDISM 08/29/2006  . LIVER HEMANGIOMA 03/03/2010  . LOW BACK PAIN 08/29/2006  . 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 MUSCLE SPASM 03/03/2010  . Nonischemic cardiomyopathy (HCC)    EF 30%  . OSTEOARTHRITIS 08/29/2006  . OSTEOPENIA 11/19/2008  . PARESTHESIA 03/05/2009  . PVC's (premature ventricular contractions)   . Scoliosis   . VARICOSE VEINS, LOWER EXTREMITIES 08/29/2006    PAST SURGICAL HISTORY: Past Surgical History:  Procedure Laterality Date  . ABDOMINAL HYSTERECTOMY    . APPENDECTOMY    . BACK SURGERY  04/13/08   lumb fusion  . BUNIONECTOMY     both  . CARPAL TUNNEL RELEASE     both  . CATARACT EXTRACTION     both  . LEFT AND RIGHT HEART CATHETERIZATION WITH CORONARY ANGIOGRAM N/A 10/04/2013   Procedure: LEFT AND RIGHT HEART CATHETERIZATION WITH CORONARY ANGIOGRAM;  Surgeon: Leonie Man, MD;  Location: [facility] CATH LAB;  Service: Cardiovascular;  Laterality: N/A;  . SHOULDER ARTHROSCOPY  02/09/2011   Procedure: ARTHROSCOPY SHOULDER;  Surgeon: Hessie Dibble, MD;  Location: [facility];  Service: Orthopedics;  Laterality: Right;  right shoulder arthroscopy subacromial decompression with rotator cuff repair  . TONSILLECTOMY      FAMILY HISTORY Family History  Problem Relation Age of Onset  . Hypertension Other   . Stroke Mother   . Cancer Father   . Emphysema Father    the patient's father died at the age of 70 from lung cancer  in the setting of tobacco abuse. [name] mother died at the age of 55 following a stroke. The patient had one brother, who is severely retarded and died from pneumonia at the age of 72. There were no sisters. There is no history of blood problems or cancer in the family to the patient's knowledge  GYNECOLOGIC HISTORY:  No LMP recorded. Patient is postmenopausal. Menarche age 76. The patient is GX P0. She stopped having periods in 04/14/78 and took hormone replacement for almost 20 years.  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 Her husband died in 1991-04-14 from a myocardial infarction. She lives by herself, with one cat for company.    ADVANCED DIRECTIVES: Not in place. If she became disabled she would want [place] to contact her neighbor and third cousin Andrey Cota at [place]: Social History  Substance Use Topics  . Smoking status: Never Smoker  . Smokeless tobacco: Never Used  . Alcohol use No     Colonoscopy: April 13, 2012  PAP: April 14, 2011  Bone density: 04/14/10  Lipid panel:  Allergies  Allergen Reactions  . Acetaminophen     REACTION: high dose - tremor  . Alendronate Sodium     Stomach upset.   . Contrast Media [Iodinated Diagnostic Agents]     Syncope.   . Diflunisal  Stomach pain.    . Doxycycline     n/v  . Flexeril [Cyclobenzaprine]     Abdominal pain.    . Nabumetone     Abdominal pain; dark, tarry stool.    . Oxybutynin     Pt does not remember.    . Raloxifene     Unknown.    . Sulfa Antibiotics Nausea Only    Syncope.    . Symbicort [Budesonide-Formoterol Fumarate]     "Jittery-ness."   . Tramadol Hcl     Numbness.    . Diclofenac Palpitations    Dark, tarry stool.      Current Outpatient Prescriptions  Medication Sig Dispense Refill  . albuterol (PROAIR HFA) 108 (90 BASE) MCG/ACT inhaler 2 puffs as needed    . carboxymethylcellulose (REFRESH PLUS) 0.5 % SOLN Place 2 drops into both eyes 4 (four)  times daily.     . carvedilol (COREG) 3.125 MG tablet Take 1 tablet (3.125 mg total) by mouth 2 (two) times daily with a meal.    . carvedilol (COREG) 6.25 MG tablet TAKE 1 TABLET BY MOUTH 2 TIMES DAILY. 180 tablet 2  . Cholecalciferol (VITAMIN D3) 1000 UNITS CAPS Take by mouth daily.      . cromolyn (NASALCROM) 5.2 MG/ACT nasal spray Place 1 spray into both nostrils 4 (four) times daily.    Marland Kitchen glucosamine-chondroitin 500-400 MG tablet Take 1 tablet by mouth 2 (two) times daily.     Marland Kitchen levothyroxine (SYNTHROID, LEVOTHROID) 25 MCG tablet Take 1 tablet (25 mcg total) by mouth daily before breakfast. 30 tablet 11  . loratadine (CLARITIN) 10 MG tablet Take 10 mg by mouth daily.    Marland Kitchen losartan (COZAAR) 25 MG tablet Take 1 tablet (25 mg total) by mouth daily.    . NON FORMULARY Allergy injections     . pantoprazole (PROTONIX) 20 MG tablet Take 1 tablet (20 mg total) by mouth 2 (two) times daily. 60 tablet 3  . sucralfate (CARAFATE) 1 g tablet Take 1 g by mouth daily.     No current facility-administered medications for this visit.     OBJECTIVE: Older white woman Who appears stated age 32:   11/13/15 1050  BP: 131/68  Pulse: 64  Resp: 18  Temp: 97.7 F (36.5 C)     Body mass index is 22.61 kg/m.    ECOG FS:2 - Symptomatic, <50% confined to bed  Sclerae unicteric, pupils round and equal Oropharynx clear and moist-- no thrush or other lesions No cervical or supraclavicular adenopathy Lungs no rales or rhonchi Heart regular rate and rhythm Abd soft, nontender, positive bowel sounds MSK no focal spinal tenderness, obvious osteoarthritic changes noted in both hands or Neuro: nonfocal, well oriented, appropriate affect Breasts: No masses noted in either breast. Both axillae are benign.     LAB RESULTS: Results for JALEEN, GRUPP (MRN [id number]) as of 11/13/2015 11:00  Ref. Range 11/06/2015 10:21  IgM, Qn, Serum Latest Ref Range: 26 - 217 mg/dL 1,151 (H)  Results for MARISABEL, MACPHERSON (MRN  [id number]) as of 11/13/2015 11:00  Ref. Range 04/24/2014 10:18 07/17/2014 09:58 10/22/2014 10:39 11/22/2014 10:25 11/06/2015 10:21  IgG (Immunoglobin G), Serum Latest Ref Range: 700 - 1,600 mg/dL 347 (L) 353 (L) 416 (L) 1,650 377 (L)  Results for MAYTTE, JACOT (MRN [id number]) as of 11/13/2015 11:00  Ref. Range 11/06/2015 10:21  M-SPIKE, % Latest Ref Range: Not Observed g/dL 0.9 (H)   CMP     Component  Value Date/Time   NA 137 11/06/2015 1021   K 5.2 (H) 11/06/2015 1021   CL 100 10/02/2013 1234   CO2 27 11/06/2015 1021   GLUCOSE 88 11/06/2015 1021   BUN 16.4 11/06/2015 1021   CREATININE 0.9 11/06/2015 1021   CALCIUM 9.2 11/06/2015 1021   PROT 6.8 11/06/2015 1021   PROT 6.5 11/06/2015 1021   ALBUMIN 3.5 11/06/2015 1021   AST 18 11/06/2015 1021   ALT 17 11/06/2015 1021   ALKPHOS 92 11/06/2015 1021   BILITOT 0.64 11/06/2015 1021   GFRNONAA 69 10/02/2013 1234   GFRAA 80 10/02/2013 1234    I No results found for: SPEP  Lab Results  Component Value Date   WBC 7.9 11/06/2015   NEUTROABS 3.7 11/06/2015   HGB 13.5 11/06/2015   HCT 40.4 11/06/2015   MCV 92.7 11/06/2015   PLT 279 11/06/2015      Chemistry      Component Value Date/Time   NA 137 11/06/2015 1021   K 5.2 (H) 11/06/2015 1021   CL 100 10/02/2013 1234   CO2 27 11/06/2015 1021   BUN 16.4 11/06/2015 1021   CREATININE 0.9 11/06/2015 1021      Component Value Date/Time   CALCIUM 9.2 11/06/2015 1021   ALKPHOS 92 11/06/2015 1021   AST 18 11/06/2015 1021   ALT 17 11/06/2015 1021   BILITOT 0.64 11/06/2015 1021       No results found for: LABCA2  No components found for: LABCA125  No results for input(s): INR in the last 168 hours.  Urinalysis    Component Value Date/Time   COLORURINE LT. YELLOW 06/08/2011 0830   APPEARANCEUR CLEAR 06/08/2011 0830   LABSPEC 1.010 06/08/2011 0830   PHURINE 6.5 06/08/2011 0830   GLUCOSEU NEGATIVE 06/08/2011 0830   HGBUR NEGATIVE 06/08/2011 0830   BILIRUBINUR NEGATIVE  06/08/2011 0830   KETONESUR NEGATIVE 06/08/2011 0830   PROTEINUR NEGATIVE 04/19/2008 1216   UROBILINOGEN 0.2 06/08/2011 0830   NITRITE NEGATIVE 06/08/2011 0830   LEUKOCYTESUR NEGATIVE 06/08/2011 0830    STUDIES: Mm Digital Screening Bilateral  Result Date: 10/22/2015 CLINICAL DATA:  Screening. EXAM: DIGITAL SCREENING BILATERAL MAMMOGRAM WITH CAD COMPARISON:  Previous exam(s). ACR Breast Density Category c: The breast tissue is heterogeneously dense, which may obscure small masses. FINDINGS: There are no findings suspicious for malignancy. Images were processed with CAD. IMPRESSION: No mammographic evidence of malignancy. A result letter of this screening mammogram will be mailed directly to the patient. RECOMMENDATION: Screening mammogram in one year. (Code:SM-B-01Y) BI-RADS CATEGORY  1: Negative. Electronically Signed   By: Altamese Cabal M.D.   On: 10/22/2015 12:59     ASSESSMENT: 75 y.o. 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 When it causes amyloidosis of the heart, which is quite rare, the pattern is restrictive. 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 In case of an infection, IVIG supplementation should be considered  PLAN:  Kimberly Merritt's Main problem of course is her congestive heart failure. She is working hard at managing that. She has had some decline in function over the last year but generally she is doing well.  She has had no significant intercurrent infections and we have not had to give her any supplemental IVIG over  the past year.  She uses acyclovir prophylactically 3 starts having mouth sores or similar viral infections. This works well for her and I was glad to refill it.  She will see  me again in one year. We will repeat her lab work at that time. She knows I will be glad to see her before then if Chauncey Cruel, MD   11/13/2015 11:12 AM

## 2016-03-24 ENCOUNTER — Telehealth: Payer: Self-pay | Admitting: Cardiology

## 2016-03-24 MED ORDER — LOSARTAN POTASSIUM 25 MG PO TABS: 25.0000 mg | ORAL_TABLET | Freq: Every day | ORAL | 3 refills | Status: DC

## 2016-03-24 NOTE — Telephone Encounter (Signed)
Rx sent to pharmacy electronically.   

## 2016-03-24 NOTE — Telephone Encounter (Signed)
New Message      *STAT* If patient is at the pharmacy, call can be transferred to refill team.   1. Which medications need to be refilled? (please list name of each medication and dose if known)  losartan (COZAAR) 25 MG tablet Take 1 tablet (25 mg total) by mouth daily.     2. Which pharmacy/location (including street and city if local pharmacy) is medication to be sent to? [facility] [address]   3. Do they need a 30 day or 90 day supply? [place]

## 2016-04-08 LAB — BASIC METABOLIC PANEL
BUN: 20 (ref 4–21)
Creatinine: 0.8 (ref 0.5–1.1)
Glucose: 89
Potassium: 4.9 (ref 3.4–5.3)
Sodium: 138 (ref 137–147)

## 2016-04-08 LAB — TSH: TSH: 1.23 (ref 0.41–5.90)

## 2016-05-03 ENCOUNTER — Encounter: Payer: Self-pay | Admitting: Cardiology

## 2016-05-11 ENCOUNTER — Ambulatory Visit (HOSPITAL_COMMUNITY): Payer: Medicare Other | Attending: Internal Medicine

## 2016-05-11 ENCOUNTER — Other Ambulatory Visit: Payer: Self-pay

## 2016-05-11 DIAGNOSIS — I420 Dilated cardiomyopathy: Secondary | ICD-10-CM | POA: Diagnosis not present

## 2016-05-11 DIAGNOSIS — I428 Other cardiomyopathies: Secondary | ICD-10-CM

## 2016-05-11 DIAGNOSIS — I501 Left ventricular failure: Secondary | ICD-10-CM | POA: Diagnosis not present

## 2016-05-11 DIAGNOSIS — I361 Nonrheumatic tricuspid (valve) insufficiency: Secondary | ICD-10-CM | POA: Insufficient documentation

## 2016-05-11 DIAGNOSIS — I340 Nonrheumatic mitral (valve) insufficiency: Secondary | ICD-10-CM | POA: Diagnosis not present

## 2016-05-17 NOTE — Progress Notes (Signed)
Stability have hemorrhoid    HPI The patient presents for follow up of cardiomyopathy. She has been noted to have an ejection fraction of 30%. This was brought to attention after she was found to have frequent premature ventricular contractions. Cardiac catheterization demonstrated nonobstructive and small vessel disease.  Earlier this month her EF was 55% on echo with moderate TR.  She returns for follow up.  Her palpitations are much improved.  The patient denies any new symptoms such as chest discomfort, neck or arm discomfort. There has been no new shortness of breath, PND or orthopnea. There have been no reported palpitations, presyncope or syncope.  She has had mild ankle swelling and mild pain.      Allergies  Allergen Reactions  . Acetaminophen     REACTION: high dose - tremor  . Alendronate Sodium     Stomach upset.   . Contrast Media [Iodinated Diagnostic Agents]     Syncope.   . Diflunisal     Stomach pain.    . Doxycycline     n/v  . Flexeril [Cyclobenzaprine]     Abdominal pain.    . Nabumetone     Abdominal pain; dark, tarry stool.    . Oxybutynin     Pt does not remember.    . Raloxifene     Unknown.    . Sulfa Antibiotics Nausea Only    Syncope.    . Symbicort [Budesonide-Formoterol Fumarate]     "Jittery-ness."   . Tramadol Hcl     Numbness.    . Diclofenac Palpitations    Dark, tarry stool.      Current Outpatient Prescriptions  Medication Sig Dispense Refill  . acyclovir (ZOVIRAX) 400 MG tablet Take 1 tablet (400 mg total) by mouth 2 (two) times daily. 60 tablet 4  . albuterol (PROAIR HFA) 108 (90 BASE) MCG/ACT inhaler 2 puffs as needed    . carboxymethylcellulose (REFRESH PLUS) 0.5 % SOLN Place 2 drops into both eyes 4 (four) times daily.     . carvedilol (COREG) 3.125 MG tablet Take 1 tablet (3.125 mg total) by mouth 2 (two) times daily with a meal.    . carvedilol (COREG) 6.25 MG tablet TAKE 1 TABLET BY MOUTH 2 TIMES DAILY. 180 tablet 2  .  Cholecalciferol (VITAMIN D3) 1000 UNITS CAPS Take by mouth daily.      . cromolyn (NASALCROM) 5.2 MG/ACT nasal spray Place 1 spray into both nostrils 4 (four) times daily.    Marland Kitchen glucosamine-chondroitin 500-400 MG tablet Take 1 tablet by mouth 2 (two) times daily.     Marland Kitchen levothyroxine (SYNTHROID, LEVOTHROID) 25 MCG tablet Take 1 tablet (25 mcg total) by mouth daily before breakfast. 30 tablet 11  . loratadine (CLARITIN) 10 MG tablet Take 10 mg by mouth daily.    Marland Kitchen losartan (COZAAR) 25 MG tablet Take 1 tablet (25 mg total) by mouth daily. 90 tablet 3  . NON FORMULARY Allergy injections     . ranitidine (ZANTAC) 150 MG tablet Take 150 mg by mouth 2 (two) times daily.    . sucralfate (CARAFATE) 1 g tablet Take 1 g by mouth daily.     No current facility-administered medications for this visit.     Past Medical History:  Diagnosis Date  . Allergy   . CAD (coronary artery disease)    Nonobstructive. LAD 30% stenosis, small PDA of the circumflex 70% stenosis, right coronary artery 40% stenosis. Catheterization September 2015  . DEPRESSION 08/29/2006  .  Diverticulosis   . GERD 08/29/2006  . HEPATIC CYST 08/02/2007  . Hypertension   . HYPOTHYROIDISM 08/29/2006  . LIVER HEMANGIOMA 03/03/2010  . LOW BACK PAIN 08/29/2006  . 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 MUSCLE SPASM 03/03/2010  . Nonischemic cardiomyopathy (HCC)    EF 30%  . OSTEOARTHRITIS 08/29/2006  . OSTEOPENIA 11/19/2008  . PARESTHESIA 03/05/2009  . PVC's (premature ventricular contractions)   . Scoliosis   . VARICOSE VEINS, LOWER EXTREMITIES 08/29/2006    Past Surgical History:  Procedure Laterality Date  . ABDOMINAL HYSTERECTOMY    . APPENDECTOMY    . BACK SURGERY  2010,2006   lumb fusion  . BUNIONECTOMY     both  . CARPAL TUNNEL RELEASE     both  . CATARACT EXTRACTION     both  . LEFT AND RIGHT HEART CATHETERIZATION WITH CORONARY ANGIOGRAM N/A 10/04/2013   Procedure: LEFT AND RIGHT HEART CATHETERIZATION WITH CORONARY ANGIOGRAM;   Surgeon: Leonie Man, MD;  Location[facility] C[facility] LAB;  Service: Cardiovascular;  Laterality: N/A;  . SHOULDER ARTHROSCOPY  02/09/2011   Procedure: ARTHROSCOPY SHOULDER;  Surgeon: Hessie Dibble, MD;  Location: [facility];  Service: Orthopedics;  Laterality: Right;  right shoulder arthroscopy subacromial decompression with rotator cuff repair  . TONSILLECTOMY      ROS:    As stated in the HPI and negative for all other systems.   PHYSICAL EXAM BP 118/62   Pulse (!) 58   Ht 5\' 4"  (1.626 m)   Wt 130 lb (59 kg)   BMI 22.31 kg/m  GENERAL:  Well appearing, and in no distress.   NECK:  No jugular venous distention, waveform within normal limits, carotid upstroke brisk and symmetric, no bruits, no thyromegaly LUNGS:  Clear to auscultation bilaterally HEART:  PMI not displaced or sustained,S1 and S2 within normal limits, no S3, positive S4, no clicks, no rubs, no  murmurs, irregular.  Unchanged from previous exam.   ABD:  Flat, positive bowel sounds normal in frequency in pitch, no bruits, no rebound, no guarding, no midline pulsatile mass, no hepatomegaly, no splenomegaly EXT:  2 plus pulses throughout, no edema, no cyanosis no clubbing, mild varicose veins and non pitting anlce edema.   EKG:  NSR, rate 58, frequent ventricular ectopy.  Nonspecific ST T wave changes.  No change from previous. 05/18/2016   ASSESSMENT AND PLAN  CARDIOMYPATHY:    Her EF improved as above.  She seems to be euvolemic. She's tolerating the meds as listed. No change in meds is indicated.   HTN: This is being managed in the context of treating her CHF.  She was having some concern about readings but is seems to be well controlled lately.  She will have no change in therapy.  PVCs:   These are not particularly symptomatic.   No change in therapy is planned.   TR:  I will follow this clinically.  No change in meds is listed.

## 2016-05-18 ENCOUNTER — Encounter: Payer: Self-pay | Admitting: Cardiology

## 2016-05-18 ENCOUNTER — Ambulatory Visit (INDEPENDENT_AMBULATORY_CARE_PROVIDER_SITE_OTHER): Payer: Medicare Other | Admitting: Cardiology

## 2016-05-18 VITALS — BP 118/62 | HR 58 | Ht 64.0 in | Wt 130.0 lb

## 2016-05-18 DIAGNOSIS — I493 Ventricular premature depolarization: Secondary | ICD-10-CM | POA: Diagnosis not present

## 2016-05-18 DIAGNOSIS — M25473 Effusion, unspecified ankle: Secondary | ICD-10-CM | POA: Diagnosis not present

## 2016-05-18 DIAGNOSIS — I420 Dilated cardiomyopathy: Secondary | ICD-10-CM

## 2016-05-18 NOTE — Patient Instructions (Signed)

## 2016-09-21 ENCOUNTER — Other Ambulatory Visit: Payer: Self-pay | Admitting: Family Medicine

## 2016-09-21 DIAGNOSIS — Z1231 Encounter for screening mammogram for malignant neoplasm of breast: Secondary | ICD-10-CM

## 2016-10-12 ENCOUNTER — Other Ambulatory Visit: Payer: Self-pay | Admitting: Cardiology

## 2016-10-12 NOTE — Telephone Encounter (Signed)
REFILL 

## 2016-10-22 ENCOUNTER — Ambulatory Visit
Admission: RE | Admit: 2016-10-22 | Discharge: 2016-10-22 | Disposition: A | Discharge status: Home / Self Care | Payer: Medicare Other | Source: Ambulatory Visit | Attending: Family Medicine | Admitting: Family Medicine

## 2016-10-22 DIAGNOSIS — Z1231 Encounter for screening mammogram for malignant neoplasm of breast: Secondary | ICD-10-CM

## 2016-11-04 ENCOUNTER — Other Ambulatory Visit (HOSPITAL_BASED_OUTPATIENT_CLINIC_OR_DEPARTMENT_OTHER): Payer: Medicare Other

## 2016-11-04 DIAGNOSIS — M899 Disorder of bone, unspecified: Secondary | ICD-10-CM

## 2016-11-04 DIAGNOSIS — D472 Monoclonal gammopathy: Secondary | ICD-10-CM

## 2016-11-04 DIAGNOSIS — M949 Disorder of cartilage, unspecified: Principal | ICD-10-CM

## 2016-11-04 LAB — CBC WITH DIFFERENTIAL/PLATELET
BASO%: 0.6 % (ref 0.0–2.0)
Basophils Absolute: 0 10*3/uL (ref 0.0–0.1)
EOS%: 1.6 % (ref 0.0–7.0)
Eosinophils Absolute: 0.1 10*3/uL (ref 0.0–0.5)
HCT: 36.8 % (ref 34.8–46.6)
HGB: 12.2 g/dL (ref 11.6–15.9)
LYMPH%: 39.8 % (ref 14.0–49.7)
MCH: 31.3 pg (ref 25.1–34.0)
MCHC: 33.2 g/dL (ref 31.5–36.0)
MCV: 94.4 fL (ref 79.5–101.0)
MONO#: 0.9 10*3/uL (ref 0.1–0.9)
MONO%: 14.5 % — ABNORMAL HIGH (ref 0.0–14.0)
NEUT#: 2.8 10*3/uL (ref 1.5–6.5)
NEUT%: 43.5 % (ref 38.4–76.8)
Platelets: 279 10*3/uL (ref 145–400)
RBC: 3.9 10*6/uL (ref 3.70–5.45)
RDW: 12.5 % (ref 11.2–14.5)
WBC: 6.4 10*3/uL (ref 3.9–10.3)
lymph#: 2.6 10*3/uL (ref 0.9–3.3)

## 2016-11-04 LAB — COMPREHENSIVE METABOLIC PANEL
ALT: 18 U/L (ref 0–55)
AST: 23 U/L (ref 5–34)
Albumin: 3.5 g/dL (ref 3.5–5.0)
Alkaline Phosphatase: 82 U/L (ref 40–150)
Anion Gap: 7 mEq/L (ref 3–11)
BUN: 16.7 mg/dL (ref 7.0–26.0)
CO2: 27 mEq/L (ref 22–29)
Calcium: 9 mg/dL (ref 8.4–10.4)
Chloride: 102 mEq/L (ref 98–109)
Creatinine: 0.8 mg/dL (ref 0.6–1.1)
EGFR: 69 mL/min/{1.73_m2} — ABNORMAL LOW (ref >90)
Glucose: 95 mg/dl (ref 70–140)
Potassium: 4.8 mEq/L (ref 3.5–5.1)
Sodium: 136 mEq/L (ref 136–145)
Total Bilirubin: 0.4 mg/dL (ref 0.20–1.20)
Total Protein: 6.6 g/dL (ref 6.4–8.3)

## 2016-11-08 LAB — MULTIPLE MYELOMA PANEL, SERUM
Albumin SerPl Elph-Mcnc: 3.4 g/dL (ref 2.9–4.4)
Albumin/Glob SerPl: 1.2 (ref 0.7–1.7)
Alpha 1: 0.2 g/dL (ref 0.0–0.4)
Alpha2 Glob SerPl Elph-Mcnc: 0.7 g/dL (ref 0.4–1.0)
B-Globulin SerPl Elph-Mcnc: 1.7 g/dL — ABNORMAL HIGH (ref 0.7–1.3)
Gamma Glob SerPl Elph-Mcnc: 0.3 g/dL — ABNORMAL LOW (ref 0.4–1.8)
Globulin, Total: 2.9 g/dL (ref 2.2–3.9)
IgA, Qn, Serum: 17 mg/dL — ABNORMAL LOW (ref 64–422)
IgG, Qn, Serum: 374 mg/dL — ABNORMAL LOW (ref 700–1600)
IgM, Qn, Serum: 1314 mg/dL — ABNORMAL HIGH (ref 26–217)
M Protein SerPl Elph-Mcnc: 0.8 g/dL — ABNORMAL HIGH
Total Protein: 6.3 g/dL (ref 6.0–8.5)

## 2016-11-10 ENCOUNTER — Telehealth: Payer: Self-pay | Admitting: Oncology

## 2016-11-10 NOTE — Telephone Encounter (Signed)
Patient was afraid of the storm - I rescheduled appts per their request.

## 2016-11-11 ENCOUNTER — Ambulatory Visit: Payer: Medicare Other | Admitting: Oncology

## 2016-11-25 NOTE — Progress Notes (Signed)
[place]  [phone] [phone]     ID: Kimberly Merritt DOB: March 06, 1940  MR#: [id number]  DUK#:[id number]  Patient Care Team: Gaynelle Arabian, MD as PCP - General (Family Medicine) Melrose Nakayama, MD as Attending Physician (Orthopedic Surgery) Magrinat, Virgie Dad, MD (Hematology and Oncology) Druscilla Brownie, MD as Consulting Physician (Dermatology) Clarene Essex, MD (Gastroenterology) Rutherford Guys, MD as Attending Physician (Ophthalmology) Terrance Mass, MD (Obstetrics and Gynecology) Mal Misty, MD as Attending Physician (Vascular Surgery) Jovita Gamma, MD as Consulting Physician (Neurosurgery) Minus Breeding, MD as Consulting Physician (Cardiology) OTHER MD: Minus Breeding MD, Marshell Garfinkel MD  CHIEF COMPLAINT: M-GUS/ low-grade lymphoproliferative process  CURRENT TREATMENT: IVIG as needed   HISTORY PRESENT ILLNESS::  from the earlier summary note:  "Kimberly Merritt" Merritt is a 76 y/o [place] woman I evaluated originally in October of 2004 for a monoclonal gammopathy. At that time she had a white cell count of 7.2, hemoglobin 12.8, MCV 94.5, and platelets 298,000. Protein workup showed an IgM kappa paraprotein of 0.6 g, with a total IgG slightly depressed at 556, but a normal IgA at 82. Because an IgM paraprotein can be associated with a low grade lymphoproliferative process (Waldenstrm's macroglobulinemia) [name] underwent bone marrow biopsy 11/27/2002, which showed ([id number]) a normal cellular marrow with trilineage hematopoiesis and 5% plasma cells. The peripheral blood film showed no significant abnormalities and on the biopsy para-spicular lymphoid aggregates were not noted. 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  The patient was followed with observation alone through May of 2009, with no evidence of progression in her IgM kappa clone. As of 06/03/2007 her total IgM spike was 0.83, her  IgG was 490 and her IgA was 36. The patient was released from followup here at that time.  More recently, as the 10 year anniversary of her original bone marrow biopsy past, Dr. Dorann Lodge of referred the patient back for reassessment and possible bone marrow rebiopsy. Kimberly Merritt was scheduled here for lab work 09/25/2013. However when she had her blood drawn she complained of some shortness of breath and her pulse was checked. It was 33. The patient was referred to the emergency room where she was evaluated by Dr. Percival Spanish who felt the patient's true heart rate was higher (she has ectopic beats which generated a week pulse and may have been missed) since in the emergency room EKG her rate was 98 with normal sinus rhythm. Troponins were negative. BNP was slightly elevated.  Dr. Percival Spanish set the patient up for an echocardiogram 09/27/2013 and this showed an ejection fraction of 30-35%. There was diffuse hypokinesis. She underwent right and left heart catheterization 10/04/2013 which showed no significant obstructions. The study did confirm an ejection fraction of roughly 35% with global hypokinesis. She is followed by Dr. Percival Spanish for her new diagnosis of non-ischemic cardiomyopathy.   Her subsequent history is as detailed below  Brayton returns today for 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 She is well overall, but notes that some days are good and some are quite not as good. She notes that today is a "not so good" day due to the weather. She has moderate generalized aching due to the weather today. She uses Bengay which alleviates her aches.   She had a routine bilateral mammogram with CAD completed at the breast center on 10/22/16 with no mammographic evidence of malignancy. Breast density category D.  REVIEW OF SYSTEMS:  Kimberly Merritt reports that she  gardens as her form of exercise. She lives in [place], [place] and it is approximately 13-15 miles to the [employer] She  reports that she has an persistent cold that comes and goes, but never fully resolves. She notes a fever last week, but she was unable to take a temperature due to her thermometer being broken. She has increased fatigue with activity. She has mild weight loss. She denies unusual headaches, visual changes, nausea, vomiting, or dizziness. There has been no unusual cough, phlegm production, or pleurisy. This been no change in bowel or bladder habits. She denies bleeding, rash, or fever. A detailed review of systems was otherwise stable.    PAST MEDICAL HISTORY: Past Medical History:  Diagnosis Date  . Allergy   . CAD (coronary artery disease)    Nonobstructive. LAD 30% stenosis, small PDA of the circumflex 70% stenosis, right coronary artery 40% stenosis. Catheterization September 2015  . DEPRESSION 08/29/2006  . Diverticulosis   . GERD 08/29/2006  . HEPATIC CYST 08/02/2007  . Hypertension   . HYPOTHYROIDISM 08/29/2006  . LIVER HEMANGIOMA 03/03/2010  . LOW BACK PAIN 08/29/2006  . 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 MUSCLE SPASM 03/03/2010  . Nonischemic cardiomyopathy (HCC)    EF 30%  . OSTEOARTHRITIS 08/29/2006  . OSTEOPENIA 11/19/2008  . PARESTHESIA 03/05/2009  . PVC's (premature ventricular contractions)   . Scoliosis   . VARICOSE VEINS, LOWER EXTREMITIES 08/29/2006    PAST SURGICAL HISTORY: Past Surgical History:  Procedure Laterality Date  . ABDOMINAL HYSTERECTOMY    . APPENDECTOMY    . BACK SURGERY  03-08-2008   lumb fusion  . BUNIONECTOMY     both  . CARPAL TUNNEL RELEASE     both  . CATARACT EXTRACTION     both  . LEFT AND RIGHT HEART CATHETERIZATION WITH CORONARY ANGIOGRAM N/A 10/04/2013   Procedure: LEFT AND RIGHT HEART CATHETERIZATION WITH CORONARY ANGIOGRAM;  Surgeon: Leonie Man, MD;  Location: [facility] CATH LAB;  Service: Cardiovascular;  Laterality: N/A;  . SHOULDER ARTHROSCOPY  02/09/2011   Procedure: ARTHROSCOPY SHOULDER;  Surgeon: Hessie Dibble, MD;  Location: [facility];  Service: Orthopedics;  Laterality: Right;  right shoulder arthroscopy subacromial decompression with rotator cuff repair  . TONSILLECTOMY      FAMILY HISTORY Family History  Problem Relation Age of Onset  . Stroke Mother   . Cancer Father   . Emphysema Father   . Hypertension Other   . Breast cancer Neg Hx    the patient's father died at the age of 35 from lung cancer in the setting of tobacco abuse. [name] mother died at the age of 23 following a stroke. The patient had one brother, who is severely retarded and died from pneumonia at the age of 3. There were no sisters. There is no history of blood problems or cancer in the family to the patient's knowledge  GYNECOLOGIC HISTORY:  No LMP recorded. Patient is postmenopausal. Menarche age 44. The patient is GX P0. She stopped having periods in 1978-03-08 and took hormone replacement for almost 20 years.  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 Her husband died in 1991-03-09 from a myocardial infarction. She lives by herself, with one cat for company.    ADVANCED DIRECTIVES: Not in place. If she became disabled she would want [place] to contact her neighbor and third cousin Andrey Cota at [place]:  Social History  Substance Use Topics  . Smoking status: Never Smoker  . Smokeless tobacco: Never Used  . Alcohol use No     Colonoscopy: 2014  PAP: 2013  Bone density: 2012  Lipid panel:  Allergies  Allergen Reactions  . Acetaminophen     REACTION: high dose - tremor  . Alendronate Sodium     Stomach upset.   . Contrast Media [Iodinated Diagnostic Agents]     Syncope.   . Diflunisal     Stomach pain.    . Doxycycline     n/v  . Flexeril [Cyclobenzaprine]     Abdominal pain.    . Nabumetone     Abdominal pain; dark, tarry stool.    . Oxybutynin     Pt does not remember.    . Raloxifene     Unknown.    . Sulfa Antibiotics Nausea Only     Syncope.    . Symbicort [Budesonide-Formoterol Fumarate]     "Jittery-ness."   . Tramadol Hcl     Numbness.    . Diclofenac Palpitations    Dark, tarry stool.      Current Outpatient Prescriptions  Medication Sig Dispense Refill  . acyclovir (ZOVIRAX) 400 MG tablet Take 1 tablet (400 mg total) by mouth 2 (two) times daily. 60 tablet 4  . albuterol (PROAIR HFA) 108 (90 BASE) MCG/ACT inhaler 2 puffs as needed    . carboxymethylcellulose (REFRESH PLUS) 0.5 % SOLN Place 2 drops into both eyes 4 (four) times daily.     . carvedilol (COREG) 3.125 MG tablet TAKE 1 TABLET BY MOUTH 2 TIMES DAILY. 180 tablet 3  . carvedilol (COREG) 6.25 MG tablet TAKE 1 TABLET BY MOUTH 2 TIMES DAILY. 180 tablet 3  . Cholecalciferol (VITAMIN D3) 1000 UNITS CAPS Take by mouth daily.      . cromolyn (NASALCROM) 5.2 MG/ACT nasal spray Place 1 spray into both nostrils 4 (four) times daily.    Marland Kitchen glucosamine-chondroitin 500-400 MG tablet Take 1 tablet by mouth 2 (two) times daily.     Marland Kitchen levothyroxine (SYNTHROID, LEVOTHROID) 25 MCG tablet Take 1 tablet (25 mcg total) by mouth daily before breakfast. 30 tablet 11  . loratadine (CLARITIN) 10 MG tablet Take 10 mg by mouth daily.    Marland Kitchen losartan (COZAAR) 25 MG tablet Take 1 tablet (25 mg total) by mouth daily. 90 tablet 3  . NON FORMULARY Allergy injections     . ranitidine (ZANTAC) 150 MG tablet Take 150 mg by mouth 2 (two) times daily.    . sucralfate (CARAFATE) 1 g tablet Take 1 g by mouth daily.     No current facility-administered medications for this visit.     OBJECTIVE: Older white woman in no acute distress  Vitals:   11/26/16 1220  BP: (!) 132/52  Pulse: (!) 58  Resp: 18  Temp: 98.1 F (36.7 C)  SpO2: 97%     Body mass index is 21.8 kg/m.    ECOG FS:2 - Symptomatic, <50% confined to bed  Sclerae unicteric, EOMs intact Oropharynx clear and moist No cervical or supraclavicular adenopathy Lungs no rales or rhonchi Heart regular rate and rhythm Abd soft,  nontender, positive bowel sounds MSK no focal spinal tenderness, no upper extremity lymphedema Neuro: nonfocal, well oriented, appropriate affect Breasts: No masses detected in either breast.  Both axillae are benign    LAB RESULTS: Her IgM has risen some, from 1151 last year to 131 for now.  Her IgG remains quite low  at 374.  The M spike remains at less than a gram, 0.8 currently.  This is an IgM kappa paraprotein  CMP     Component Value Date/Time   NA 136 11/04/2016 1115   K 4.8 11/04/2016 1115   CL 100 10/02/2013 1234   CO2 27 11/04/2016 1115   GLUCOSE 95 11/04/2016 1115   BUN 16.7 11/04/2016 1115   CREATININE 0.8 11/04/2016 1115   CALCIUM 9.0 11/04/2016 1115   PROT 6.6 11/04/2016 1115   PROT 6.3 11/04/2016 1115   ALBUMIN 3.5 11/04/2016 1115   AST 23 11/04/2016 1115   ALT 18 11/04/2016 1115   ALKPHOS 82 11/04/2016 1115   BILITOT 0.40 11/04/2016 1115   GFRNONAA 69 10/02/2013 1234   GFRAA 80 10/02/2013 1234    I No results found for: SPEP  Lab Results  Component Value Date   WBC 6.4 11/04/2016   NEUTROABS 2.8 11/04/2016   HGB 12.2 11/04/2016   HCT 36.8 11/04/2016   MCV 94.4 11/04/2016   PLT 279 11/04/2016      Chemistry      Component Value Date/Time   NA 136 11/04/2016 1115   K 4.8 11/04/2016 1115   CL 100 10/02/2013 1234   CO2 27 11/04/2016 1115   BUN 16.7 11/04/2016 1115   CREATININE 0.8 11/04/2016 1115      Component Value Date/Time   CALCIUM 9.0 11/04/2016 1115   ALKPHOS 82 11/04/2016 1115   AST 23 11/04/2016 1115   ALT 18 11/04/2016 1115   BILITOT 0.40 11/04/2016 1115       No results found for: LABCA2  No components found for: LABCA125  No results for input(s): INR in the last 168 hours.  Urinalysis    Component Value Date/Time   COLORURINE LT. YELLOW 06/08/2011 0830   APPEARANCEUR CLEAR 06/08/2011 0830   LABSPEC 1.010 06/08/2011 0830   PHURINE 6.5 06/08/2011 0830   GLUCOSEU NEGATIVE 06/08/2011 0830   HGBUR NEGATIVE 06/08/2011  0830   BILIRUBINUR NEGATIVE 06/08/2011 0830   KETONESUR NEGATIVE 06/08/2011 0830   PROTEINUR NEGATIVE 04/19/2008 1216   UROBILINOGEN 0.2 06/08/2011 0830   NITRITE NEGATIVE 06/08/2011 0830   LEUKOCYTESUR NEGATIVE 06/08/2011 0830    STUDIES: Lab work discussed in detail with the patient  Bilateral screening mammography October 22, 2016 showed the breast density to be category D.  There was no malignancy noted  ASSESSMENT: 76 y.o. 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 When it causes amyloidosis of the heart, which is quite rare, the pattern is restrictive. 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 In case of an infection, IVIG supplementation should be considered  (a) received IVIG October 2016  (b) Pete IVIG October 2018  PLAN:  Kimberly Merritt is generally stable and does not show clear progression to multiple myeloma or to a more aggressive myeloproliferative problem.  On the other hand she still has a very low IVIG level and she has been fighting a "cold" now for about 2 months, despite a course of antibiotics.  I think she would benefit from IVIG supplementation, which hopefully will carry her through the winter.  She understands the possible toxicities, side effects and complications of this, which she did not experience with her first dose  2 years ago.  This has been scheduled for December 07, 2016.  I am going to start following her labs a little bit more closely, every few months, although she still will see me only on a once a year basis unless some other problem develops between visits.  She knows to call for any other problems that may develop between then.  Magrinat, Virgie Dad, MD  11/26/16 12:39  PM Medical Oncology and Hematology [facility] [address] [place], [place] [address] Tel. [phone]    Fax. [phone]  This document serves as a record of services personally performed by Lurline Del, MD. It was created on her behalf by Steva Colder, a trained medical scribe. The creation of this record is based on the scribe's personal observations and the provider's statements to them. This document has been checked and approved by the attending provider.

## 2016-11-26 ENCOUNTER — Ambulatory Visit (HOSPITAL_BASED_OUTPATIENT_CLINIC_OR_DEPARTMENT_OTHER): Payer: Medicare Other | Admitting: Oncology

## 2016-11-26 VITALS — BP 132/52 | HR 58 | Temp 98.1°F | Resp 18 | Ht 64.0 in | Wt 127.0 lb

## 2016-11-26 DIAGNOSIS — I420 Dilated cardiomyopathy: Secondary | ICD-10-CM

## 2016-11-26 DIAGNOSIS — D472 Monoclonal gammopathy: Secondary | ICD-10-CM | POA: Diagnosis not present

## 2016-11-26 DIAGNOSIS — D801 Nonfamilial hypogammaglobulinemia: Secondary | ICD-10-CM | POA: Diagnosis not present

## 2016-11-26 DIAGNOSIS — I428 Other cardiomyopathies: Secondary | ICD-10-CM

## 2016-11-26 DIAGNOSIS — M949 Disorder of cartilage, unspecified: Secondary | ICD-10-CM

## 2016-11-26 DIAGNOSIS — D471 Chronic myeloproliferative disease: Secondary | ICD-10-CM | POA: Insufficient documentation

## 2016-11-26 DIAGNOSIS — M899 Disorder of bone, unspecified: Secondary | ICD-10-CM

## 2016-12-03 ENCOUNTER — Telehealth: Payer: Self-pay | Admitting: Oncology

## 2016-12-03 NOTE — Telephone Encounter (Signed)
Scheduled appt per 10/26 - appt added - patient is aware of the appt date and time.

## 2016-12-10 ENCOUNTER — Ambulatory Visit (HOSPITAL_BASED_OUTPATIENT_CLINIC_OR_DEPARTMENT_OTHER): Payer: Medicare Other

## 2016-12-10 VITALS — BP 134/66 | HR 69 | Temp 98.8°F | Resp 18

## 2016-12-10 DIAGNOSIS — K130 Diseases of lips: Secondary | ICD-10-CM

## 2016-12-10 DIAGNOSIS — D472 Monoclonal gammopathy: Secondary | ICD-10-CM

## 2016-12-10 DIAGNOSIS — D801 Nonfamilial hypogammaglobulinemia: Secondary | ICD-10-CM | POA: Diagnosis not present

## 2016-12-10 DIAGNOSIS — R05 Cough: Secondary | ICD-10-CM

## 2016-12-10 DIAGNOSIS — R053 Chronic cough: Secondary | ICD-10-CM

## 2016-12-10 MED ORDER — IMMUNE GLOBULIN (HUMAN) 20 GM/200ML IV SOLN
1.0000 g/kg | Freq: Once | INTRAVENOUS | Status: AC
  Administered 2016-12-10: 60 g via INTRAVENOUS
  Filled 2016-12-10: qty 600

## 2016-12-10 NOTE — Patient Instructions (Signed)

## 2016-12-16 ENCOUNTER — Ambulatory Visit (HOSPITAL_BASED_OUTPATIENT_CLINIC_OR_DEPARTMENT_OTHER): Payer: Medicare Other | Admitting: Medical

## 2016-12-16 ENCOUNTER — Telehealth: Payer: Self-pay | Admitting: *Deleted

## 2016-12-16 ENCOUNTER — Ambulatory Visit (HOSPITAL_COMMUNITY)
Admission: RE | Admit: 2016-12-16 | Discharge: 2016-12-16 | Disposition: A | Discharge status: Home / Self Care | Payer: Medicare Other | Source: Ambulatory Visit | Attending: Oncology | Admitting: Oncology

## 2016-12-16 VITALS — BP 143/70 | HR 60 | Temp 98.2°F | Resp 18 | Ht 64.0 in | Wt 127.7 lb

## 2016-12-16 DIAGNOSIS — J0110 Acute frontal sinusitis, unspecified: Secondary | ICD-10-CM

## 2016-12-16 DIAGNOSIS — D472 Monoclonal gammopathy: Secondary | ICD-10-CM

## 2016-12-16 DIAGNOSIS — D801 Nonfamilial hypogammaglobulinemia: Secondary | ICD-10-CM | POA: Diagnosis not present

## 2016-12-16 DIAGNOSIS — D471 Chronic myeloproliferative disease: Secondary | ICD-10-CM | POA: Insufficient documentation

## 2016-12-16 MED ORDER — LEVOFLOXACIN 250 MG PO TABS: 250.0000 mg | ORAL_TABLET | Freq: Every day | ORAL | 0 refills | Status: DC

## 2016-12-16 NOTE — Telephone Encounter (Signed)
See other entry 

## 2016-12-16 NOTE — Telephone Encounter (Addendum)
This RN spoke with pt this AM to follow up per her phone discussion yesterday.  Pt had 2nd dose of IVIG on 11/9 - over the weekend she developed worsen symptoms of chills, arthralgias and malaise- then Monday - started having an AM cough with production of yellow brownish sputum.  She has not had fevers greater then 99 and symptoms seemed to be improving per call yesterday.  Today - her symptoms are worsening - with increased congestion and cough.  She is would like to be seen at this office per recent IVIG infusion and current symptoms.  CXR ordered and appointment scheduled for [facility].  This note will sent to [facility] for communication of pending visit

## 2016-12-17 NOTE — Progress Notes (Signed)
[facility] Progress Note   Kimberly Merritt [id number] 10-Nov-1940 76 y.o.  Kimberly Merritt is managed by Dr. Jana Hakim  Actively treated with chemotherapy: IVIG for hypogammaglobulinemia  Last Treated: 12/10/2016  Assessment: Plan:    Acute frontal sinusitis, recurrence not specified - Plan: levofloxacin (LEVAQUIN) 250 MG tablet   Acute frontal sinusitis: Patient was given a prescription for Levaquin 250 mg p.o. once daily times 10 days  Please see After Visit Summary for patient specific instructions.  Future Appointments  Date Time Provider [place]  03/29/2017 11:00 AM [facility] None  07/27/2017 11:00 AM [facility] None  11/24/2017 11:00 AM [facility] 6 [facility] None  12/01/2017  1:00 PM Magrinat, Virgie Dad, MD [facility] None    No orders of the defined types were placed in this encounter.      Subjective:   Patient ID:  Kimberly Merritt is a 76 y.o. (DOB 1940-03-19) female.  Chief Complaint:  Chief Complaint  Patient presents with  . URI    HPI Kimberly Merritt is a 76 year old female with a history of a monoclonal gammopathy and hypogammaglobulinemia.  She is treated with IVIG with her last dose given on 12/10/2016.  She presents to the office today with a report of chills beginning last Friday with maxillary and frontal sinus pain and pressure, ear stuffiness, postnasal drainage, cough with brownish yellow sputum, and generalized weakness.  She was treated for bronchitis in September with amoxicillin and reports developing C. difficile.  Medications: I have reviewed the patient's current medications.  Allergies:  Allergies  Allergen Reactions  . Acetaminophen     REACTION: high dose - tremor  . Alendronate Sodium     Stomach upset.   . Contrast Media [Iodinated Diagnostic Agents]     Syncope.   . Diflunisal     Stomach pain.    . Doxycycline     n/v  . Flexeril [Cyclobenzaprine]     Abdominal  pain.    . Nabumetone     Abdominal pain; dark, tarry stool.    . Oxybutynin     Pt does not remember.    . Raloxifene     Unknown.    . Sulfa Antibiotics Nausea Only    Syncope.    . Symbicort [Budesonide-Formoterol Fumarate]     "Jittery-ness."   . Tramadol Hcl     Numbness.    . Diclofenac Palpitations    Dark, tarry stool.      Past Medical History:  Diagnosis Date  . Allergy   . CAD (coronary artery disease)    Nonobstructive. LAD 30% stenosis, small PDA of the circumflex 70% stenosis, right coronary artery 40% stenosis. Catheterization September 2015  . DEPRESSION 08/29/2006  . Diverticulosis   . GERD 08/29/2006  . HEPATIC CYST 08/02/2007  . Hypertension   . HYPOTHYROIDISM 08/29/2006  . LIVER HEMANGIOMA 03/03/2010  . LOW BACK PAIN 08/29/2006  . 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 MUSCLE SPASM 03/03/2010  . Nonischemic cardiomyopathy (HCC)    EF 30%  . OSTEOARTHRITIS 08/29/2006  . OSTEOPENIA 11/19/2008  . PARESTHESIA 03/05/2009  . PVC's (premature ventricular contractions)   . Scoliosis   . VARICOSE VEINS, LOWER EXTREMITIES 08/29/2006    Past Surgical History:  Procedure Laterality Date  . ABDOMINAL HYSTERECTOMY    . APPENDECTOMY    . ARTHROSCOPY SHOULDER Right 02/09/2011   Performed by Hessie Dibble, MD at [facility]  . BACK  SURGERY  2010,2006   lumb fusion  . BUNIONECTOMY     both  . CARPAL TUNNEL RELEASE     both  . CATARACT EXTRACTION     both  . LEFT AND RIGHT HEART CATHETERIZATION WITH CORONARY ANGIOGRAM N/A 10/04/2013   Performed by Leonie Man, MD at [facility] CATH LAB  . TONSILLECTOMY      Family History  Problem Relation Age of Onset  . Stroke Mother   . Cancer Father   . Emphysema Father   . Hypertension Other   . Breast cancer Neg Hx     Social History   Socioeconomic History  . Marital status: Widowed    Spouse name: Not on file  . Number of children: Not on file  . Years of education: Not on file  . Highest education  level: Not on file  Social Needs  . Financial resource strain: Not on file  . Food insecurity - worry: Not on file  . Food insecurity - inability: Not on file  . Transportation needs - medical: Not on file  . Transportation needs - non-medical: Not on file  Occupational History  . Occupation: Retired  Tobacco Use  . Smoking status: Never Smoker  . Smokeless tobacco: Never Used  Substance and Sexual Activity  . Alcohol use: No    Alcohol/week: 0.0 oz  . Drug use: No  . Sexual activity: Not on file  Other Topics Concern  . Not on file  Social History Narrative   Lives alone.      Past Medical History, Surgical history, Social history, and Family history were reviewed and updated as appropriate.   Please see review of systems for further details on the patient's review from today.   Review of Systems:  Review of Systems  Constitutional: Positive for chills. Negative for diaphoresis and fever.  HENT: Positive for postnasal drip, sinus pressure and sinus pain. Negative for congestion, rhinorrhea and sore throat.        Ear fullness  Respiratory: Positive for cough. Negative for choking and shortness of breath.   Cardiovascular: Negative for chest pain.  Neurological: Negative for headaches.    Objective:   Physical Exam:  BP (!) 143/70 (BP Location: Left Arm, Patient Position: Sitting) Comment: nurse aware  Pulse 60   Temp 98.2 F (36.8 C) (Oral)   Resp 18   Ht 5\' 4"  (1.626 m)   Wt 127 lb 11.2 oz (57.9 kg)   SpO2 98%   BMI 21.92 kg/m  ECOG: 0  Physical Exam  Constitutional: No distress.  HENT:  Head: Normocephalic and atraumatic.  Right Ear: External ear normal.  Left Ear: External ear normal.  Nose: Right sinus exhibits maxillary sinus tenderness and frontal sinus tenderness. Left sinus exhibits maxillary sinus tenderness and frontal sinus tenderness.  Mouth/Throat: Oropharynx is clear and moist. No oropharyngeal exudate.  Eyes: Right eye exhibits no discharge.  Left eye exhibits no discharge. No scleral icterus.  Neck: Normal range of motion. Neck supple.  Cardiovascular: Normal rate, regular rhythm and normal heart sounds. Exam reveals no gallop and no friction rub.  No murmur heard. Pulmonary/Chest: Effort normal. No respiratory distress. She has no wheezes. She has no rales.  Lymphadenopathy:    She has no cervical adenopathy.  Neurological: She is alert. Coordination normal.  Skin: Skin is warm and dry. She is not diaphoretic.    Lab Review:     Component Value Date/Time   NA 136 11/04/2016 1115  K 4.8 11/04/2016 1115   CL 100 10/02/2013 1234   CO2 27 11/04/2016 1115   GLUCOSE 95 11/04/2016 1115   BUN 16.7 11/04/2016 1115   CREATININE 0.8 11/04/2016 1115   CALCIUM 9.0 11/04/2016 1115   PROT 6.6 11/04/2016 1115   PROT 6.3 11/04/2016 1115   ALBUMIN 3.5 11/04/2016 1115   AST 23 11/04/2016 1115   ALT 18 11/04/2016 1115   ALKPHOS 82 11/04/2016 1115   BILITOT 0.40 11/04/2016 1115   GFRNONAA 69 10/02/2013 1234   GFRAA 80 10/02/2013 1234       Component Value Date/Time   WBC 6.4 11/04/2016 1115   WBC 10.6 (H) 10/02/2013 1334   RBC 3.90 11/04/2016 1115   RBC 4.78 10/02/2013 1334   HGB 12.2 11/04/2016 1115   HCT 36.8 11/04/2016 1115   PLT 279 11/04/2016 1115   MCV 94.4 11/04/2016 1115   MCH 31.3 11/04/2016 1115   MCH 30.1 10/02/2013 1334   MCHC 33.2 11/04/2016 1115   MCHC 34.0 10/02/2013 1334   RDW 12.5 11/04/2016 1115   LYMPHSABS 2.6 11/04/2016 1115   MONOABS 0.9 11/04/2016 1115   EOSABS 0.1 11/04/2016 1115   BASOSABS 0.0 11/04/2016 1115   -------------------------------  Imaging from last 24 hours (if applicable):  Radiology interpretation: Dg Chest 2 View  Result Date: 12/16/2016 CLINICAL DATA:  Monoclonal paraproteinemia.  Cough EXAM: CHEST  2 VIEW COMPARISON:  10/09/2014 FINDINGS: Lungs are clear without evidence of pneumonia. Negative for heart failure or effusion. Thoracic scoliosis. Lumbar fusion hardware.  IMPRESSION: No active cardiopulmonary disease. Electronically Signed   By: Franchot Gallo M.D.   On: 12/16/2016 13:13

## 2016-12-24 ENCOUNTER — Telehealth: Payer: Self-pay | Admitting: Medical

## 2016-12-24 NOTE — Telephone Encounter (Signed)
Per 11/15 - no los at check out

## 2017-01-26 ENCOUNTER — Other Ambulatory Visit: Payer: Self-pay | Admitting: Oncology

## 2017-03-14 DIAGNOSIS — J301 Allergic rhinitis due to pollen: Secondary | ICD-10-CM | POA: Diagnosis not present

## 2017-03-14 DIAGNOSIS — J3081 Allergic rhinitis due to animal (cat) (dog) hair and dander: Secondary | ICD-10-CM | POA: Diagnosis not present

## 2017-03-14 DIAGNOSIS — J3089 Other allergic rhinitis: Secondary | ICD-10-CM | POA: Diagnosis not present

## 2017-03-18 ENCOUNTER — Other Ambulatory Visit: Payer: Self-pay | Admitting: Cardiology

## 2017-03-18 NOTE — Telephone Encounter (Signed)
REFILL 

## 2017-03-29 ENCOUNTER — Inpatient Hospital Stay: Payer: Medicare Other | Attending: Oncology

## 2017-03-29 DIAGNOSIS — J301 Allergic rhinitis due to pollen: Secondary | ICD-10-CM | POA: Diagnosis not present

## 2017-03-29 DIAGNOSIS — J3089 Other allergic rhinitis: Secondary | ICD-10-CM | POA: Diagnosis not present

## 2017-03-29 DIAGNOSIS — J3081 Allergic rhinitis due to animal (cat) (dog) hair and dander: Secondary | ICD-10-CM | POA: Diagnosis not present

## 2017-03-29 DIAGNOSIS — M899 Disorder of bone, unspecified: Secondary | ICD-10-CM

## 2017-03-29 DIAGNOSIS — D472 Monoclonal gammopathy: Secondary | ICD-10-CM | POA: Diagnosis not present

## 2017-03-29 DIAGNOSIS — M949 Disorder of cartilage, unspecified: Secondary | ICD-10-CM

## 2017-03-29 LAB — CBC WITH DIFFERENTIAL/PLATELET
Basophils Absolute: 0.1 10*3/uL (ref 0.0–0.1)
Basophils Relative: 1 %
Eosinophils Absolute: 0.1 10*3/uL (ref 0.0–0.5)
Eosinophils Relative: 2 %
HCT: 38 % (ref 34.8–46.6)
Hemoglobin: 12.5 g/dL (ref 11.6–15.9)
Lymphocytes Relative: 42 %
Lymphs Abs: 3.1 10*3/uL (ref 0.9–3.3)
MCH: 31.4 pg (ref 25.1–34.0)
MCHC: 33 g/dL (ref 31.5–36.0)
MCV: 95.1 fL (ref 79.5–101.0)
Monocytes Absolute: 1.1 10*3/uL — ABNORMAL HIGH (ref 0.1–0.9)
Monocytes Relative: 15 %
Neutro Abs: 3 10*3/uL (ref 1.5–6.5)
Neutrophils Relative %: 40 %
Platelets: 279 10*3/uL (ref 145–400)
RBC: 4 MIL/uL (ref 3.70–5.45)
RDW: 12.4 % (ref 11.2–14.5)
WBC: 7.5 10*3/uL (ref 3.9–10.3)

## 2017-03-29 LAB — COMPREHENSIVE METABOLIC PANEL
ALT: 17 U/L (ref 0–55)
AST: 18 U/L (ref 5–34)
Albumin: 3.5 g/dL (ref 3.5–5.0)
Alkaline Phosphatase: 82 U/L (ref 40–150)
Anion gap: 9 (ref 3–11)
BUN: 18 mg/dL (ref 7–26)
CO2: 28 mmol/L (ref 22–29)
Calcium: 9.1 mg/dL (ref 8.4–10.4)
Chloride: 103 mmol/L (ref 98–109)
Creatinine, Ser: 0.88 mg/dL (ref 0.60–1.10)
GFR calc Af Amer: >60 mL/min (ref >60)
GFR calc non Af Amer: >60 mL/min (ref >60)
Glucose, Bld: 73 mg/dL (ref 70–140)
Potassium: 4.9 mmol/L (ref 3.5–5.1)
Sodium: 140 mmol/L (ref 136–145)
Total Bilirubin: 0.4 mg/dL (ref 0.2–1.2)
Total Protein: 6.9 g/dL (ref 6.4–8.3)

## 2017-03-31 LAB — MULTIPLE MYELOMA PANEL, SERUM
Albumin SerPl Elph-Mcnc: 3.6 g/dL (ref 2.9–4.4)
Albumin/Glob SerPl: 1.3 (ref 0.7–1.7)
Alpha 1: 0.2 g/dL (ref 0.0–0.4)
Alpha2 Glob SerPl Elph-Mcnc: 0.6 g/dL (ref 0.4–1.0)
B-Globulin SerPl Elph-Mcnc: 1.7 g/dL — ABNORMAL HIGH (ref 0.7–1.3)
Gamma Glob SerPl Elph-Mcnc: 0.5 g/dL (ref 0.4–1.8)
Globulin, Total: 3 g/dL (ref 2.2–3.9)
IgA: 16 mg/dL — ABNORMAL LOW (ref 64–422)
IgG (Immunoglobin G), Serum: 584 mg/dL — ABNORMAL LOW (ref 700–1600)
IgM (Immunoglobulin M), Srm: 1339 mg/dL — ABNORMAL HIGH (ref 26–217)
M Protein SerPl Elph-Mcnc: 0.9 g/dL — ABNORMAL HIGH
Total Protein ELP: 6.6 g/dL (ref 6.0–8.5)

## 2017-04-04 ENCOUNTER — Other Ambulatory Visit: Payer: Self-pay | Admitting: Oncology

## 2017-04-05 ENCOUNTER — Encounter: Payer: Self-pay | Admitting: Family Medicine

## 2017-04-05 ENCOUNTER — Ambulatory Visit (INDEPENDENT_AMBULATORY_CARE_PROVIDER_SITE_OTHER): Payer: Medicare Other | Admitting: Family Medicine

## 2017-04-05 VITALS — BP 127/74 | HR 69 | Ht 64.0 in | Wt 128.4 lb

## 2017-04-05 DIAGNOSIS — I2510 Atherosclerotic heart disease of native coronary artery without angina pectoris: Secondary | ICD-10-CM | POA: Diagnosis not present

## 2017-04-05 DIAGNOSIS — K635 Polyp of colon: Secondary | ICD-10-CM | POA: Diagnosis not present

## 2017-04-05 DIAGNOSIS — E039 Hypothyroidism, unspecified: Secondary | ICD-10-CM

## 2017-04-05 DIAGNOSIS — Z780 Asymptomatic menopausal state: Secondary | ICD-10-CM | POA: Insufficient documentation

## 2017-04-05 DIAGNOSIS — K219 Gastro-esophageal reflux disease without esophagitis: Secondary | ICD-10-CM

## 2017-04-05 DIAGNOSIS — J3089 Other allergic rhinitis: Secondary | ICD-10-CM

## 2017-04-05 DIAGNOSIS — Z560 Unemployment, unspecified: Secondary | ICD-10-CM | POA: Diagnosis not present

## 2017-04-05 DIAGNOSIS — B001 Herpesviral vesicular dermatitis: Secondary | ICD-10-CM | POA: Diagnosis not present

## 2017-04-05 DIAGNOSIS — I10 Essential (primary) hypertension: Secondary | ICD-10-CM

## 2017-04-05 DIAGNOSIS — Z860100 Personal history of colon polyps, unspecified: Secondary | ICD-10-CM | POA: Insufficient documentation

## 2017-04-05 DIAGNOSIS — I493 Ventricular premature depolarization: Secondary | ICD-10-CM | POA: Insufficient documentation

## 2017-04-05 DIAGNOSIS — M419 Scoliosis, unspecified: Secondary | ICD-10-CM | POA: Insufficient documentation

## 2017-04-05 DIAGNOSIS — I428 Other cardiomyopathies: Secondary | ICD-10-CM

## 2017-04-05 DIAGNOSIS — D471 Chronic myeloproliferative disease: Secondary | ICD-10-CM

## 2017-04-05 DIAGNOSIS — I868 Varicose veins of other specified sites: Secondary | ICD-10-CM | POA: Insufficient documentation

## 2017-04-05 DIAGNOSIS — I700 Atherosclerosis of aorta: Secondary | ICD-10-CM | POA: Insufficient documentation

## 2017-04-05 DIAGNOSIS — Z8639 Personal history of other endocrine, nutritional and metabolic disease: Secondary | ICD-10-CM | POA: Insufficient documentation

## 2017-04-05 NOTE — Patient Instructions (Signed)
Please check with your cardiologist regarding whether or not they feel you need cholesterol screening on a yearly basis or not.    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  Also in the near future please come in for fasting blood work with [place] at your convenience and then if you would like you can make a follow-up with me to discuss those results afterwards.  Please realize, EXERCISE IS MEDICINE!  -  American Heart Association [facility] [place]) guidelines for exercise : If you are in good health, without any medical conditions, you should engage in 150 minutes of moderate intensity aerobic activity per week.  This means you should be huffing and puffing throughout your workout.   Engaging in regular exercise will improve brain function and memory, as well as improve mood, boost immune system and help with weight management.  As well as the other, more well-known effects of exercise such as decreasing blood sugar levels, decreasing blood pressure,  and decreasing bad cholesterol levels/ increasing good cholesterol levels.     -  The AHA strongly endorses consumption of a diet that contains a variety of foods from all the food categories with an emphasis on fruits and vegetables; fat-free and low-fat dairy products; cereal and grain products; legumes and nuts; and fish, poultry, and/or extra lean meats.    Excessive food intake, especially of foods high in saturated and trans fats, sugar, and salt, should be avoided.    Adequate water intake of roughly 1/2 of your weight in pounds, should equal the ounces of water per day you should drink.  So for instance, if you're 200 pounds, that would be 100 ounces of water per day.         Mediterranean Diet  Why follow it? Research shows. . Those who follow the Mediterranean diet have a reduced risk of heart disease  . The diet is associated with a reduced incidence of Parkinson's and  Alzheimer's diseases . People following the diet may have longer life expectancies and lower rates of chronic diseases  . The Dietary Guidelines for Americans recommends the Mediterranean diet as an eating plan to promote health and prevent disease  What Is the Mediterranean Diet?  . Healthy eating plan based on typical foods and recipes of Mediterranean-style cooking . The diet is primarily a plant based diet; these foods should make up a majority of meals   Starches - Plant based foods should make up a majority of meals - They are an important sources of vitamins, minerals, energy, antioxidants, and fiber - Choose whole grains, foods high in fiber and minimally processed items  - Typical grain sources include wheat, oats, barley, corn, brown rice, bulgar, farro, millet, polenta, couscous  - Various types of beans include chickpeas, lentils, fava beans, black beans, white beans   Fruits  Veggies - Large quantities of antioxidant rich fruits & veggies; 6 or more servings  - Vegetables can be eaten raw or lightly drizzled with oil and cooked  - Vegetables common to the traditional Mediterranean Diet include: artichokes, arugula, beets, broccoli, brussel sprouts, cabbage, carrots, celery, collard greens, cucumbers, eggplant, kale, leeks, lemons, lettuce, mushrooms, okra, onions, peas, peppers, potatoes, pumpkin, radishes, rutabaga, shallots, spinach, sweet potatoes, turnips, zucchini - Fruits common to the Mediterranean Diet include: apples, apricots, avocados, cherries, clementines, dates, figs, grapefruits, grapes, melons, nectarines, oranges, peaches, pears, pomegranates, strawberries, tangerines  Fats - Replace butter and  margarine with healthy oils, such as olive oil, canola oil, and tahini  - Limit nuts to no more than a handful a day  - Nuts include walnuts, almonds, pecans, pistachios, pine nuts  - Limit or avoid candied, honey roasted or heavily salted nuts - Olives are central to the  Mediterranean diet - can be eaten whole or used in a variety of dishes   Meats Protein - Limiting red meat: no more than a few times a month - When eating red meat: choose lean cuts and keep the portion to the size of deck of cards - Eggs: approx. 0 to 4 times a week  - Fish and lean poultry: at least 2 a week  - Healthy protein sources include, chicken, [place], lean beef, lamb - Increase intake of seafood such as tuna, salmon, trout, mackerel, shrimp, scallops - Avoid or limit high fat processed meats such as sausage and bacon  Dairy - Include moderate amounts of low fat dairy products  - Focus on healthy dairy such as fat free yogurt, skim milk, low or reduced fat cheese - Limit dairy products higher in fat such as whole or 2% milk, cheese, ice cream  Alcohol - Moderate amounts of red wine is ok  - No more than 5 oz daily for women (all ages) and men older than age 41  - No more than 10 oz of wine daily for men younger than 70  Other - Limit sweets and other desserts  - Use herbs and spices instead of salt to flavor foods  - Herbs and spices common to the traditional Mediterranean Diet include: basil, bay leaves, chives, cloves, cumin, fennel, garlic, lavender, marjoram, mint, oregano, parsley, pepper, rosemary, sage, savory, sumac, tarragon, thyme   It's not just a diet, it's a lifestyle:  . The Mediterranean diet includes lifestyle factors typical of those in the region  . Foods, drinks and meals are best eaten with others and savored . Daily physical activity is important for overall good health . This could be strenuous exercise like running and aerobics . This could also be more leisurely activities such as walking, housework, yard-work, or taking the stairs . Moderation is the key; a balanced and healthy diet accommodates most foods and drinks . Consider portion sizes and frequency of consumption of certain foods   Meal Ideas & Options:  . Breakfast:  o Whole wheat toast or whole  wheat English muffins with peanut butter & hard boiled egg o Steel cut oats topped with apples & cinnamon and skim milk  o Fresh fruit: banana, strawberries, melon, berries, peaches  o Smoothies: strawberries, bananas, greek yogurt, peanut butter o Low fat greek yogurt with blueberries and granola  o Egg white omelet with spinach and mushrooms o Breakfast couscous: whole wheat couscous, apricots, skim milk, cranberries  . Sandwiches:  o Hummus and grilled vegetables (peppers, zucchini, squash) on whole wheat bread   o Grilled chicken on whole wheat pita with lettuce, tomatoes, cucumbers or tzatziki  o Tuna salad on whole wheat bread: tuna salad made with greek yogurt, olives, red peppers, capers, green onions o Garlic rosemary lamb pita: lamb sauted with garlic, rosemary, salt & pepper; add lettuce, cucumber, greek yogurt to pita - flavor with lemon juice and black pepper  . Seafood:  o Mediterranean grilled salmon, seasoned with garlic, basil, parsley, lemon juice and black pepper o Shrimp, lemon, and spinach whole-grain pasta salad made with low fat greek yogurt  o Seared scallops with  lemon orzo  o Seared tuna steaks seasoned salt, pepper, coriander topped with tomato mixture of olives, tomatoes, olive oil, minced garlic, parsley, green onions and cappers  . Meats:  o Herbed greek chicken salad with kalamata olives, cucumber, feta  o Red bell peppers stuffed with spinach, bulgur, lean ground beef (or lentils) & topped with feta   o Kebabs: skewers of chicken, tomatoes, onions, zucchini, squash  o [place] burgers: made with red onions, mint, dill, lemon juice, feta cheese topped with roasted red peppers . Vegetarian o Cucumber salad: cucumbers, artichoke hearts, celery, red onion, feta cheese, tossed in olive oil & lemon juice  o Hummus and whole grain pita points with a greek salad (lettuce, tomato, feta, olives, cucumbers, red onion) o Lentil soup with celery, carrots made with vegetable  broth, garlic, salt and pepper  o Tabouli salad: parsley, bulgur, mint, scallions, cucumbers, tomato, radishes, lemon juice, olive oil, salt and pepper.

## 2017-04-05 NOTE — Progress Notes (Signed)
New patient office visit note:  Impression and Recommendations:    1. Benign colonic polyp- found in 07/2011- told f/up Dr Watt Climes 5 yrs   2. Hypertension, unspecified type- txed by Cardiology per pt   3. 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 Nonischemic cardiomyopathy ([place])   5. Coronary artery disease, angina presence unspecified, unspecified vessel or lesion type, unspecified whether native or transplanted heart   6. 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 Gastroesophageal reflux disease, esophagitis presence not specified   8. Herpes labialis   9. Hypothyroidism, unspecified type   10. Environmental and seasonal allergies   11. History of hypoglycemia   12. Post-menopausal     Education and routine counseling performed. Handouts provided.  - Recommended to continue following up regularly with her other specialists.  1. Health Maintenance - Advised patient to continue working toward exercising to improve health.    - Patient may begin with 15 minutes of activity daily.  Recommended that the patient eventually strive for at least 150 minutes of cardio per week according to the [facility].   - Healthy dietary habits encouraged, including low-carb, and high amounts of lean protein in diet.  Advised patient to eat regularly and adequately.  - Patient should also consume adequate amounts of water - half of body weight in oz of water per day.  2. Follow-Up - Patient advised to call gastroenterology to follow up for her colonic polyp.  Patient was supposed to obtain follow up colonoscopy last year but declined at that time.  - Patient is not fasting today.   - Baseline labs will be obtained and reviewed in the near future with follow up OV 1 week later. - Advised patient to come in first thing in the morning for her labs.  - Number one priority is thyroid measure to refill her synthroid. - Then we will obtain Vitamin D, B12, and A1c, given her history of  hypoglycemia. - Other labs (CBC/CMP) have been obtained recently, or through other specialties. - Advised patient to follow up with Cardiology regarding her need for cholesterol/lipid panel screenings on a yearly basis.  TIME  Pt was in the office today for 40+ minutes, with over 50% time spent in face to face counseling of patients various medical conditions, treatment plans of those medical conditions including medicine management and lifestyle modification, strategies to improve health and well being; and in coordination of care. SEE ABOVE FOR DETAILS    Orders Placed This Encounter  Procedures  . Hemoglobin A1c  . TSH  . T4, free  . T3, free  . VITAMIN D 25 Hydroxy (Vit-D Deficiency, Fractures)  . Vitamin B12    No orders of the defined types were placed in this encounter.   Gross side effects, risk and benefits, and alternatives of medications discussed with patient.  Patient is aware that all medications have potential side effects and we are unable to predict every side effect or drug-drug interaction that may occur.  Expresses verbal understanding and consents to current therapy plan and treatment regimen.  Return for labs in near future with a f/up OV with me 1 wk later.  Please see AVS handed out to patient at the end of our visit for further patient instructions/ counseling done pertaining to today's office visit.    Note: This document was prepared using Dragon voice recognition software and may include unintentional dictation errors.  This document serves as a record of  services personally performed by Mellody Dance, DO. It was created on her behalf by [name], a trained medical scribe. The creation of this record is based on the scribe's personal observations and the provider's statements to them.   I have reviewed the above medical documentation for accuracy and completeness and I concur.  Mellody Dance 04/05/17 12:20  PM   ----------------------------------------------------------------------------------------------------------------------    Subjective:    Chief complaint:   Chief Complaint  Patient presents with  . Establish Care    HPI: Kimberly Merritt is a pleasant 77 y.o. female who presents to [place] [facility] today to review their medical history with me and establish care.   I asked the patient to review their chronic problem list with me to ensure everything was updated and accurate.    All recent office visits with other providers, any medical records that patient brought in etc  - I reviewed today.     We asked pt to get [place] their medical records from [facility] providers/ specialists that they had seen within the past 3-5 years- if they are in private practice and/or do not work for [employer], [facility], [place], [place] or [employer] owned practice.  Told them to call their specialists to clarify this if they are not sure.   Looking for a new doctor because this clinic is closer, and concerns with her previous doctor. Per patient, "I was advised by him that I couldn't be treated for anything."  Was previously seen by Dr. Marisue Humble.   Last saw him in October 2018.  Historically followed up with him every 6 months to a year.  Is on Medicare, [employer].  Social History At age 42, quit job on disability. Notes debilitating hand arthritis.  [place]  Past Medical History Per patient, has a low immune system.  Has never had diabetes. However, in 1981 or 1982, she was told she is spontaneously hypoglycemic. Notes that she tries not to go long periods without eating.  - Monoclonal gammopathy/myeloproliferative disease Is set up every 3 months to see Dr. Jana Hakim for IVIG infusions.  - Heart Failure/CAD & Cardiology Follows up with Dr. Percival Spanish with cardiology.  Visits yearly. History of congestive heart failure, per patient determined August 25th  2016. 05/11/2016 last echocardiogram = ejection fracture was in range of 55-60.  Notes discomfort in her heart, but not "pain pain."  Patient notes being diagnosed with bradycardia.  - HTN Blood pressure today 127/74.  BP averages 116/58 at home. Takes losartan and carvedilol.  Previous PCP was monitoring liver function on these.  Notes that when diastolic gets down into the 50's, she doesn't have energy to do work.  Has some problems with leg swelling at night. Has a recurring area on her left LE that bleeds.  - GERD Was on Protonix - had been on it for many years. Takes Zantac now. Dr. Trilby Leaver gave her the Carafate prescription.  Notes having a very sensitive stomach.  - Herpes Labialis Notes having "cold outbreaks" that she treats with acyclovir. Dr. Jana Hakim treats this.  - Cholesterol Cholesterol has not been checked since 2013-2014. Notes that previous PCP was only checking cholesterol every 5 years. Per patient, was told that her cholesterol was good enough that it didn't need to be checked.  - Environmental & Seasonal Allergies Allergist is Dr. Mosetta Anis. Obtains allergy injections every two weeks.  - Hand Arthritis Notes debilitating hand arthritis. Has had several tests, all  came back as osteoarthritis. Went to a rheumatologist for 2 years in early 2000's. Was eventually dismissed by doctor and told she couldn't be treated. Treated by Dr. Charlestine Night in the past, notes "as a mix of osteo and rheumatoid arthritis."  - Thyroid Patient notes that she needs testing done. Obtained thyroid meds from her previous PCP.  - Gastroenterology Was seeing Dr. Watt Climes through [facility]. One benign polyp found in 07/2011, during last colonoscopy. Was asked for repeat in 5 years - currently overdue.   Declined repeat colonoscopy at that time because she was feeling weak.   Wt Readings from Last 3 Encounters:  04/05/17 128 lb 6.4 oz (58.2 kg)  12/16/16 127 lb 11.2 oz (57.9 kg)   11/26/16 127 lb (57.6 kg)   BP Readings from Last 3 Encounters:  04/05/17 127/74  12/16/16 (!) 143/70  12/10/16 134/66   Pulse Readings from Last 3 Encounters:  04/05/17 69  12/16/16 60  12/10/16 69   BMI Readings from Last 3 Encounters:  04/05/17 22.04 kg/m  12/16/16 21.92 kg/m  11/26/16 21.80 kg/m    Patient Care Team    Relationship Specialty Notifications Start End  Mellody Dance, DO PCP - General Family Medicine  03/29/17   Melrose Nakayama, MD Attending Physician Orthopedic Surgery  05/28/11   Magrinat, Virgie Dad, MD  Hematology and Oncology  05/28/11   Druscilla Brownie, MD Consulting Physician Dermatology  05/28/11   Clarene Essex, MD  Gastroenterology  05/28/11   Rutherford Guys, MD Attending Physician Ophthalmology  05/28/11   Terrance Mass, MD  Obstetrics and Gynecology  08/31/11   Mal Misty, MD Attending Physician Vascular Surgery  08/31/11   Jovita Gamma, MD Consulting Physician Neurosurgery  08/21/13   Minus Breeding, MD Consulting Physician Cardiology  11/29/13   Levy Sjogren, MD Referring Physician Dermatology  04/05/17   Irine Seal, MD Attending Physician Urology  04/05/17   Rutherford Guys, MD Consulting Physician Ophthalmology  04/05/17   Mosetta Anis, MD Referring Physician Allergy  04/05/17   Hurley Cisco, MD Consulting Physician Rheumatology  04/05/17     Patient Active Problem List   Diagnosis Date Noted  . Hypertension 04/05/2017    Priority: High  . Benign colonic polyp- found in 07/2011- told f/up Dr Watt Climes 5 yrs 04/05/2017    Priority: Medium  . 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 Nonischemic cardiomyopathy ([place]) 11/29/2013    Priority: Medium  . MONOCLONAL GAMMOPATHY 03/03/2010    Priority: Medium  . Herpes labialis 12/01/2009    Priority: Medium  . Gastroesophageal reflux disease 08/29/2006    Priority: Medium  . disabled status- since age 23 due to RA in [place] 04/05/2017    Priority: Low   . Environmental and seasonal allergies 04/05/2017    Priority: Low  . History of hypoglycemia 04/05/2017    Priority: Low  . Post-menopausal 04/05/2017    Priority: Low  . HYPOTHYROIDISM 08/29/2006    Priority: Low  . OSTEOARTHRITIS 08/29/2006    Priority: Low  . CAD (coronary artery disease) 04/05/2017  . Atherosclerosis of aorta ([place]) 04/05/2017  . 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 History of colonic polyps 04/05/2017  . Scoliosis 04/05/2017  . Ventricular premature depolarization 04/05/2017  . PVC's (premature ventricular contractions) 05/18/2016  . Chronic cough 10/23/2014  . Bradycardia 09/25/2013  . Lip lesion 08/21/2013  . Diverticulosis of colon without hemorrhage 05/02/2013  . Paresthesia 06/20/2012  . Well adult exam 08/31/2011  . Cough 08/10/2011  .  Sinusitis, acute frontal 02/15/2011  . Shoulder pain, right 07/09/2010  . LIVER HEMANGIOMA 03/03/2010  . MUSCLE SPASM 03/03/2010  . Disorder of bone and cartilage 11/19/2008  . HEPATIC CYST 08/02/2007  . ALLERGIC RHINITIS 04/25/2007  . HOARSENESS 04/25/2007  . 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 LOW BACK PAIN 08/29/2006  . Pain in Soft Tissues of Limb 08/29/2006     Past Medical History:  Diagnosis Date  . Allergy   . CAD (coronary artery disease)    Nonobstructive. LAD 30% stenosis, small PDA of the circumflex 70% stenosis, right coronary artery 40% stenosis. Catheterization September 2015  . DEPRESSION 08/29/2006  . Diverticulosis   . GERD 08/29/2006  . HEPATIC CYST 08/02/2007  . Hypertension   . HYPOTHYROIDISM 08/29/2006  . LIVER HEMANGIOMA 03/03/2010  . LOW BACK PAIN 08/29/2006  . 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 MUSCLE SPASM 03/03/2010  . Nonischemic cardiomyopathy ([facility])    EF 30%  . OSTEOARTHRITIS 08/29/2006  . OSTEOPENIA 11/19/2008  . PARESTHESIA 03/05/2009  . PVC's (premature ventricular contractions)   . Scoliosis   . VARICOSE VEINS, LOWER EXTREMITIES 08/29/2006     Past Medical History:   Diagnosis Date  . Allergy   . CAD (coronary artery disease)    Nonobstructive. LAD 30% stenosis, small PDA of the circumflex 70% stenosis, right coronary artery 40% stenosis. Catheterization September 2015  . DEPRESSION 08/29/2006  . Diverticulosis   . GERD 08/29/2006  . HEPATIC CYST 08/02/2007  . Hypertension   . HYPOTHYROIDISM 08/29/2006  . LIVER HEMANGIOMA 03/03/2010  . LOW BACK PAIN 08/29/2006  . 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 MUSCLE SPASM 03/03/2010  . Nonischemic cardiomyopathy ([facility])    EF 30%  . OSTEOARTHRITIS 08/29/2006  . OSTEOPENIA 11/19/2008  . PARESTHESIA 03/05/2009  . PVC's (premature ventricular contractions)   . Scoliosis   . VARICOSE VEINS, LOWER EXTREMITIES 08/29/2006     Past Surgical History:  Procedure Laterality Date  . ABDOMINAL HYSTERECTOMY  1981  . APPENDECTOMY    . BACK SURGERY  2010,2006   lumb fusion  . BUNIONECTOMY     both  . BUNIONECTOMY Right 1985  . CARPAL TUNNEL RELEASE     both  . CARPAL TUNNEL RELEASE Right 1992  . CARPAL TUNNEL RELEASE Left 1992  . CATARACT EXTRACTION     both  . CATARACT EXTRACTION Right 2008  . CATARACT EXTRACTION Left 2008  . HAMMER TOE SURGERY Left 1987  . HAMMER TOE SURGERY Right 1986  . LEFT AND RIGHT HEART CATHETERIZATION WITH CORONARY ANGIOGRAM N/A 10/04/2013   Procedure: LEFT AND RIGHT HEART CATHETERIZATION WITH CORONARY ANGIOGRAM;  Surgeon: Leonie Man, MD;  Location: [facility];  Service: Cardiovascular;  Laterality: N/A;  . LUMBAR FUSION  2006  . LUMBAR FUSION  2010  . OOPHORECTOMY  1967  . OVARIAN CYST REMOVAL    . ROTATOR CUFF REPAIR Right 2013  . SHOULDER ARTHROSCOPY  02/09/2011   Procedure: ARTHROSCOPY SHOULDER;  Surgeon: Hessie Dibble, MD;  Location: [facility];  Service: Orthopedics;  Laterality: Right;  right shoulder arthroscopy subacromial decompression with rotator cuff repair  . TONSILLECTOMY       Family History  Problem Relation Age of Onset  . Stroke Mother   .  Cancer Father   . Emphysema Father   . Hypertension Other   . Breast cancer Neg Hx      Social History   Substance and Sexual Activity  Drug Use No  Social History   Substance and Sexual Activity  Alcohol Use No  . Alcohol/week: 0.0 oz     Social History   Tobacco Use  Smoking Status Never Smoker  Smokeless Tobacco Never Used     Current Meds  Medication Sig  . acyclovir (ZOVIRAX) 400 MG tablet TAKE 1 TABLET (400 MG TOTAL) BY MOUTH 2 TIMES DAILY.  . carvedilol (COREG) 3.125 MG tablet TAKE 1 TABLET BY MOUTH 2 TIMES DAILY.  . carvedilol (COREG) 6.25 MG tablet TAKE 1 TABLET BY MOUTH 2 TIMES DAILY.  Marland Kitchen Cholecalciferol (VITAMIN D3) 1000 UNITS CAPS Take by mouth daily.    . cromolyn (NASALCROM) 5.2 MG/ACT nasal spray Place 1 spray into both nostrils 4 (four) times daily.  Marland Kitchen EPINEPHrine 0.3 mg/0.3 mL IJ SOAJ injection   . fexofenadine (ALLEGRA) 180 MG tablet Take 180 mg by mouth daily.  Marland Kitchen glucosamine-chondroitin 500-400 MG tablet Take 1 tablet by mouth 2 (two) times daily.   Marland Kitchen levothyroxine (SYNTHROID, LEVOTHROID) 25 MCG tablet Take 1 tablet (25 mcg total) by mouth daily before breakfast.  . losartan (COZAAR) 25 MG tablet Take 1 tablet (25 mg total) by mouth daily. KEEP OV.  . NON FORMULARY Allergy injections   . Polyethyl Glycol-Propyl Glycol (SYSTANE ULTRA) 0.4-0.3 % SOLN Apply to eye. 2 drops in each eye 3 to 4 times daily  . ranitidine (ZANTAC) 150 MG tablet Take 150 mg by mouth 2 (two) times daily.  Marland Kitchen SALINE NASAL SPRAY NA Place into the nose. 2 sprays each nostril every 4 hours  . sucralfate (CARAFATE) 1 g tablet Take 1 g by mouth daily.    Allergies: Acetaminophen; Alendronate sodium; Contrast media [iodinated diagnostic agents]; Diflunisal; Doxycycline; Flexeril [cyclobenzaprine]; Nabumetone; Oxybutynin; Raloxifene; Sulfa antibiotics; Symbicort [budesonide-formoterol fumarate]; Tramadol hcl; Diclofenac; and Latex   Review of Systems  Constitutional: Negative  for chills, diaphoresis, fever, malaise/fatigue and weight loss.  HENT: Negative for congestion, sore throat and tinnitus.        Chronic hay fever/allergies.  Eyes: Negative for blurred vision, double vision and photophobia.  Respiratory: Negative for cough and wheezing.   Cardiovascular: Positive for chest pain (Chronic discomfort per heart failure.). Negative for palpitations.       Patient notes heart failure.  Gastrointestinal: Negative for blood in stool, diarrhea, nausea and vomiting.  Genitourinary: Negative for dysuria, frequency and urgency.       Chronic nighttime urination.  Musculoskeletal: Positive for joint pain (Chronic) and myalgias (Chronic).       Chronic muscle/joint pain.  Skin: Negative for itching and rash.  Neurological: Negative for dizziness, focal weakness, weakness and headaches.  Endo/Heme/Allergies: Negative for environmental allergies and polydipsia. Does not bruise/bleed easily.  Psychiatric/Behavioral: Negative for depression and memory loss. The patient is not nervous/anxious and does not have insomnia.      Objective:   Blood pressure 127/74, pulse 69, height _0  (1.626 m), weight 128 lb 6.4 oz (58.2 kg), SpO2 98 %. Body mass index is 22.04 kg/m. General: Well Developed, well nourished, and in no acute distress.  Neuro: Alert and oriented x3, extra-ocular muscles intact, sensation grossly intact.  HEENT:[place]/AT, PERRLA, neck supple, No carotid bruits Skin: no gross rashes  Cardiac: Regular rate and rhythm Respiratory: Essentially clear to auscultation bilaterally. Not using accessory muscles, speaking in full sentences.  Abdominal: not grossly distended Musculoskeletal: Ambulates w/o diff, FROM * 4 ext.  Vasc: less 2 sec cap RF, warm and pink  Psych:  No HI/SI, judgement and insight good, Euthymic  mood. Full Affect. LE Edema: 2+ non-pitting edema bilateral LE.   Recent Results (from the past 2160 hour(s))  Multiple Myeloma Panel (SPEP&IFE w/QIG)      Status: Abnormal   Collection Time: 03/29/17 10:35 AM  Result Value Ref Range   IgG (Immunoglobin G), Serum 584 (L) 700 - 1,600 mg/dL   IgA 16 (L) 64 - 422 mg/dL    Comment: Result confirmed on concentration.   IgM (Immunoglobulin M), Srm 1,339 (H) 26 - 217 mg/dL    Comment: (NOTE) Results confirmed on dilution.    Total Protein ELP 6.6 6.0 - 8.5 g/dL   Albumin SerPl Elph-Mcnc 3.6 2.9 - 4.4 g/dL   Alpha 1 0.2 0.0 - 0.4 g/dL   Alpha2 Glob SerPl Elph-Mcnc 0.6 0.4 - 1.0 g/dL   B-Globulin SerPl Elph-Mcnc 1.7 (H) 0.7 - 1.3 g/dL   Gamma Glob SerPl Elph-Mcnc 0.5 0.4 - 1.8 g/dL   M Protein SerPl Elph-Mcnc 0.9 (H) Not Observed g/dL   Globulin, Total 3.0 2.2 - 3.9 g/dL   Albumin/Glob SerPl 1.3 0.7 - 1.7   IFE 1 Comment     Comment: (NOTE) Immunofixation shows IgM monoclonal protein with kappa light chain specificity.    Please Note Comment     Comment: (NOTE) Protein electrophoresis scan will follow via computer, mail, or courier delivery. Performed At: [facility] [place], [place] [id number] Rush Farmer MD EX:[id number] Performed at [facility], [place] [address], [place], [place] [address]   Comprehensive metabolic panel     Status: None   Collection Time: 03/29/17 10:36 AM  Result Value Ref Range   Sodium 140 136 - 145 mmol/L   Potassium 4.9 3.5 - 5.1 mmol/L   Chloride 103 98 - 109 mmol/L   CO2 28 22 - 29 mmol/L   Glucose, Bld 73 70 - 140 mg/dL   BUN 18 7 - 26 mg/dL   Creatinine, Ser 0.88 0.60 - 1.10 mg/dL   Calcium 9.1 8.4 - 10.4 mg/dL   Total Protein 6.9 6.4 - 8.3 g/dL   Albumin 3.5 3.5 - 5.0 g/dL   AST 18 5 - 34 U/L   ALT 17 0 - 55 U/L   Alkaline Phosphatase 82 40 - 150 U/L   Total Bilirubin 0.4 0.2 - 1.2 mg/dL   GFR calc non Af Amer >60 >60 mL/min   GFR calc Af Amer >60 >60 mL/min    Comment: (NOTE) The eGFR has been calculated using the CKD EPI equation. This calculation has not been validated in all clinical  situations. eGFR's persistently <60 mL/min signify possible Chronic Kidney Disease.    Anion gap 9 3 - 11    Comment: Performed at [facility], [address] [place], [place] [address]  CBC with Differential     Status: Abnormal   Collection Time: 03/29/17 10:36 AM  Result Value Ref Range   WBC 7.5 3.9 - 10.3 K/uL   RBC 4.00 3.70 - 5.45 MIL/uL   Hemoglobin 12.5 11.6 - 15.9 g/dL   HCT 38.0 34.8 - 46.6 %   MCV 95.1 79.5 - 101.0 fL   MCH 31.4 25.1 - 34.0 pg   MCHC 33.0 31.5 - 36.0 g/dL   RDW 12.4 11.2 - 14.5 %   Platelets 279 145 - 400 K/uL   Neutrophils Relative % 40 %   Neutro Abs 3.0 1.5 - 6.5 K/uL   Lymphocytes Relative 42 %   Lymphs Abs 3.1  0.9 - 3.3 K/uL   Monocytes Relative 15 %   Monocytes Absolute 1.1 (H) 0.1 - 0.9 K/uL   Eosinophils Relative 2 %   Eosinophils Absolute 0.1 0.0 - 0.5 K/uL   Basophils Relative 1 %   Basophils Absolute 0.1 0.0 - 0.1 K/uL    Comment: Performed at [facility], [place] [address], [place], [place] [address]

## 2017-04-07 ENCOUNTER — Other Ambulatory Visit: Payer: Medicare Other

## 2017-04-07 ENCOUNTER — Telehealth: Payer: Self-pay | Admitting: Cardiology

## 2017-04-07 DIAGNOSIS — D471 Chronic myeloproliferative disease: Secondary | ICD-10-CM

## 2017-04-07 DIAGNOSIS — M949 Disorder of cartilage, unspecified: Secondary | ICD-10-CM

## 2017-04-07 DIAGNOSIS — D472 Monoclonal gammopathy: Secondary | ICD-10-CM

## 2017-04-07 DIAGNOSIS — E039 Hypothyroidism, unspecified: Secondary | ICD-10-CM | POA: Diagnosis not present

## 2017-04-07 DIAGNOSIS — K219 Gastro-esophageal reflux disease without esophagitis: Secondary | ICD-10-CM | POA: Diagnosis not present

## 2017-04-07 DIAGNOSIS — M899 Disorder of bone, unspecified: Secondary | ICD-10-CM

## 2017-04-07 DIAGNOSIS — Z8639 Personal history of other endocrine, nutritional and metabolic disease: Secondary | ICD-10-CM | POA: Diagnosis not present

## 2017-04-07 DIAGNOSIS — Z780 Asymptomatic menopausal state: Secondary | ICD-10-CM

## 2017-04-07 DIAGNOSIS — I428 Other cardiomyopathies: Secondary | ICD-10-CM | POA: Diagnosis not present

## 2017-04-07 NOTE — Telephone Encounter (Signed)
New Message  Pt c/o BP issue: STAT if pt c/o blurred vision, one-sided weakness or slurred speech  1. What are your last 5 BP readings? 124/64 hr 61  113/61 116/62  2. Are you having any other symptoms (ex. Dizziness, headache, blurred vision, passed out)? Heart fluttering, and dizziness from time to time  3. What is your BP issue? Pt says that she is having low bps and heart fluttering

## 2017-04-07 NOTE — Addendum Note (Signed)
Addended by: Fonnie Mu on: 04/07/2017 09:41 AM   Modules accepted: Orders

## 2017-04-07 NOTE — Telephone Encounter (Signed)
Returned call to patient.She stated for the past few weeks she has been having chest discomfort and heart fluttering.Stated she has appointment with Dr.Hochrein 05/12/17, but would like to be seen sooner.No chest discomfort at present.Appointment scheduled with Dr.Hochrein tomorrow 3/8 at 2:00 pm.Advised to go to ED if needed.

## 2017-04-07 NOTE — Progress Notes (Signed)
HPI The patient presents for follow up of cardiomyopathy. She has been noted to have an ejection fraction of 30%. This was brought to attention after she was found to have frequent premature ventricular contractions. Cardiac catheterization demonstrated nonobstructive and small vessel disease.   EF was 55% on echo with moderate TR in April.    She was put on my schedule today after seeing her primary care doctor because she was having some increased chest discomfort.  She has multiple different kinds of discomfort.  She has a dull ache that is been progressive to become now constant.  It hurts leaning forward.  She is felt like her arm was jumping.  She is had some fleeting discomfort down into her left shoulder.  She is had a little more shortness of breath.  She is noticed her heart beating and knows these are PVCs but it might be more frequent.  She has not had any classic substernal pressure or neck discomfort.  She is been sleeping on a few more pillows but has not had any true PND or orthopnea.  She has not had any cough fevers or chills.  Of note she did not have any orthostatic blood pressure dropped in the office today.  I do see blood work that included a normal hemoglobin and creatinine recently.    Allergies  Allergen Reactions  . Acetaminophen     REACTION: high dose - tremor  . Alendronate Sodium     Stomach upset.   . Contrast Media [Iodinated Diagnostic Agents]     Syncope.   . Diflunisal     Stomach pain.    . Doxycycline     n/v  . Flexeril [Cyclobenzaprine]     Abdominal pain.    . Nabumetone     Abdominal pain; dark, tarry stool.    . Oxybutynin     Pt does not remember.    . Raloxifene     Unknown.    . Sulfa Antibiotics Nausea Only    Syncope.    . Symbicort [Budesonide-Formoterol Fumarate]     "Jittery-ness."   . Tramadol Hcl     Numbness.    . Diclofenac Palpitations    Dark, tarry stool.    . Latex Rash    Current Outpatient Medications    Medication Sig Dispense Refill  . acyclovir (ZOVIRAX) 400 MG tablet TAKE 1 TABLET (400 MG TOTAL) BY MOUTH 2 TIMES DAILY. 60 tablet 4  . Cholecalciferol (VITAMIN D3) 1000 UNITS CAPS Take by mouth daily.      . cromolyn (NASALCROM) 5.2 MG/ACT nasal spray Place 1 spray into both nostrils 4 (four) times daily.    Marland Kitchen EPINEPHrine 0.3 mg/0.3 mL IJ SOAJ injection   1  . fexofenadine (ALLEGRA) 180 MG tablet Take 180 mg by mouth daily.    Marland Kitchen glucosamine-chondroitin 500-400 MG tablet Take 1 tablet by mouth 2 (two) times daily.     Marland Kitchen levothyroxine (SYNTHROID, LEVOTHROID) 25 MCG tablet Take 1 tablet (25 mcg total) by mouth daily before breakfast. 30 tablet 11  . losartan (COZAAR) 25 MG tablet Take 1 tablet (25 mg total) by mouth daily. KEEP OV. 90 tablet 0  . NON FORMULARY Allergy injections     . Polyethyl Glycol-Propyl Glycol (SYSTANE ULTRA) 0.4-0.3 % SOLN Apply to eye. 2 drops in each eye 3 to 4 times daily    . ranitidine (ZANTAC) 150 MG tablet Take 150 mg by mouth 2 (two) times daily.    Marland Kitchen  SALINE NASAL SPRAY NA Place into the nose. 2 sprays each nostril every 4 hours    . sucralfate (CARAFATE) 1 g tablet Take 1 g by mouth daily.    . carvedilol (COREG) 12.5 MG tablet Take 1 tablet (12.5 mg total) by mouth 2 (two) times daily. 180 tablet 3   No current facility-administered medications for this visit.     Past Medical History:  Diagnosis Date  . Allergy   . CAD (coronary artery disease)    Nonobstructive. LAD 30% stenosis, small PDA of the circumflex 70% stenosis, right coronary artery 40% stenosis. Catheterization September 2015  . DEPRESSION 08/29/2006  . Diverticulosis   . GERD 08/29/2006  . HEPATIC CYST 08/02/2007  . Hypertension   . HYPOTHYROIDISM 08/29/2006  . LIVER HEMANGIOMA 03/03/2010  . LOW BACK PAIN 08/29/2006  . 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 MUSCLE SPASM 03/03/2010  . Nonischemic cardiomyopathy (HCC)    EF 30%  . OSTEOARTHRITIS 08/29/2006  . OSTEOPENIA 11/19/2008  . PARESTHESIA  03/05/2009  . PVC's (premature ventricular contractions)   . Scoliosis   . VARICOSE VEINS, LOWER EXTREMITIES 08/29/2006    Past Surgical History:  Procedure Laterality Date  . ABDOMINAL HYSTERECTOMY  1981  . APPENDECTOMY    . BACK SURGERY  2010,2006   lumb fusion  . BUNIONECTOMY     both  . BUNIONECTOMY Right 1985  . CARPAL TUNNEL RELEASE     both  . CARPAL TUNNEL RELEASE Right 1992  . CARPAL TUNNEL RELEASE Left 1992  . CATARACT EXTRACTION     both  . CATARACT EXTRACTION Right 2008  . CATARACT EXTRACTION Left 2008  . HAMMER TOE SURGERY Left 1987  . HAMMER TOE SURGERY Right 1986  . LEFT AND RIGHT HEART CATHETERIZATION WITH CORONARY ANGIOGRAM N/A 10/04/2013   Procedure: LEFT AND RIGHT HEART CATHETERIZATION WITH CORONARY ANGIOGRAM;  Surgeon: Leonie Man, MD;  Location: [facility] CATH LAB;  Service: Cardiovascular;  Laterality: N/A;  . LUMBAR FUSION  2006  . LUMBAR FUSION  2010  . OOPHORECTOMY  1967  . OVARIAN CYST REMOVAL    . ROTATOR CUFF REPAIR Right 2013  . SHOULDER ARTHROSCOPY  02/09/2011   Procedure: ARTHROSCOPY SHOULDER;  Surgeon: Hessie Dibble, MD;  Location: [facility];  Service: Orthopedics;  Laterality: Right;  right shoulder arthroscopy subacromial decompression with rotator cuff repair  . TONSILLECTOMY      ROS:     As stated in the HPI and negative for all other systems.   PHYSICAL EXAM BP (!) 142/56   Pulse 70   Ht 5\' 4"  (1.626 m)   Wt 131 lb 6.4 oz (59.6 kg)   BMI 22.55 kg/m   GENERAL:  Well appearing NECK:  No jugular venous distention, waveform within normal limits, carotid upstroke brisk and symmetric, no bruits, no thyromegaly LUNGS:  Clear to auscultation bilaterally CHEST:  Unremarkable HEART:  PMI not displaced or sustained,S1 and S2 within normal limits, no S3, no S4, no clicks, no rubs, no murmurs ABD:  Flat, positive bowel sounds normal in frequency in pitch, no bruits, no rebound, no guarding, no midline pulsatile mass, no  hepatomegaly, no splenomegaly EXT:  2 plus pulses throughout, no edema, no cyanosis no clubbing    EKG:  NSR, rate 70, frequent ventricular ectopy.  Nonspecific ST T wave changes.  No change from previous PVCs. 04/08/2017   ASSESSMENT AND PLAN  CARDIOMYPATHY:     Her ejection fraction had been low in the past but  improved.  I would not have any reason to suspect that it was reduced again.  No change in therapy specifically for this is indicated but I will change her meds as below.  No further imaging at this point.   HTN: Blood pressure she says fluctuates but it sounds like it has been pretty good to me.  She will continue with the med changes as below.  PVCs:     She may be a little more symptomatic with this and I plan to increase her carvedilol to 12.5 mg twice daily.    TR: I will follow this clinically and probably will repeat an echocardiogram later this year.

## 2017-04-08 ENCOUNTER — Ambulatory Visit: Payer: Medicare Other | Admitting: Cardiology

## 2017-04-08 ENCOUNTER — Encounter: Payer: Self-pay | Admitting: Cardiology

## 2017-04-08 VITALS — BP 142/56 | HR 70 | Ht 64.0 in | Wt 131.4 lb

## 2017-04-08 DIAGNOSIS — I429 Cardiomyopathy, unspecified: Secondary | ICD-10-CM | POA: Diagnosis not present

## 2017-04-08 DIAGNOSIS — I493 Ventricular premature depolarization: Secondary | ICD-10-CM

## 2017-04-08 DIAGNOSIS — I10 Essential (primary) hypertension: Secondary | ICD-10-CM

## 2017-04-08 DIAGNOSIS — R079 Chest pain, unspecified: Secondary | ICD-10-CM | POA: Diagnosis not present

## 2017-04-08 LAB — TSH: TSH: 3.06 u[IU]/mL (ref 0.450–4.500)

## 2017-04-08 LAB — VITAMIN B12: Vitamin B-12: 612 pg/mL (ref 232–1245)

## 2017-04-08 LAB — T4, FREE: Free T4: 1.14 ng/dL (ref 0.82–1.77)

## 2017-04-08 LAB — HEMOGLOBIN A1C
Est. average glucose Bld gHb Est-mCnc: 117 mg/dL
Hgb A1c MFr Bld: 5.7 % — ABNORMAL HIGH (ref 4.8–5.6)

## 2017-04-08 LAB — VITAMIN D 25 HYDROXY (VIT D DEFICIENCY, FRACTURES): Vit D, 25-Hydroxy: 36.3 ng/mL (ref 30.0–100.0)

## 2017-04-08 LAB — T3, FREE: T3, Free: 2.7 pg/mL (ref 2.0–4.4)

## 2017-04-08 MED ORDER — CARVEDILOL 12.5 MG PO TABS: 12.5000 mg | ORAL_TABLET | Freq: Two times a day (BID) | ORAL | 3 refills | Status: DC

## 2017-04-08 NOTE — Patient Instructions (Signed)
Medication Instructions:  INCREASE- Carvedilol 12.5 mg twice a day  If you need a refill on your cardiac medications before your next appointment, please call your pharmacy.  Labwork: None Ordered   Testing/Procedures: None Ordered  Follow-Up: Your physician wants you to follow-up in: 4 Months.     Thank you for choosing [facility] at [facility]!!

## 2017-04-12 ENCOUNTER — Ambulatory Visit: Payer: Medicare Other | Admitting: Family Medicine

## 2017-04-12 VITALS — BP 123/71 | HR 55 | Ht 64.0 in | Wt 130.9 lb

## 2017-04-12 DIAGNOSIS — M25561 Pain in right knee: Secondary | ICD-10-CM

## 2017-04-12 DIAGNOSIS — E559 Vitamin D deficiency, unspecified: Secondary | ICD-10-CM

## 2017-04-12 DIAGNOSIS — Z23 Encounter for immunization: Secondary | ICD-10-CM | POA: Diagnosis not present

## 2017-04-12 DIAGNOSIS — E039 Hypothyroidism, unspecified: Secondary | ICD-10-CM

## 2017-04-12 DIAGNOSIS — G8929 Other chronic pain: Secondary | ICD-10-CM | POA: Diagnosis not present

## 2017-04-12 DIAGNOSIS — R7303 Prediabetes: Secondary | ICD-10-CM | POA: Diagnosis not present

## 2017-04-12 DIAGNOSIS — M1990 Unspecified osteoarthritis, unspecified site: Secondary | ICD-10-CM

## 2017-04-12 DIAGNOSIS — K635 Polyp of colon: Secondary | ICD-10-CM

## 2017-04-12 DIAGNOSIS — I10 Essential (primary) hypertension: Secondary | ICD-10-CM | POA: Diagnosis not present

## 2017-04-12 MED ORDER — LEVOTHYROXINE SODIUM 25 MCG PO TABS: 25.0000 ug | ORAL_TABLET | Freq: Every day | ORAL | 1 refills | Status: DC

## 2017-04-12 NOTE — Patient Instructions (Signed)
Please provide patient with vaccine information sheet on Tdap as well as the pneumonia vaccine etc.  Patient will talk to her hematology-oncology doctor about getting additional vaccinations other than basic recommended ones.     Risk factors for prediabetes and type 2 diabetes  Researchers don't fully understand why some people develop prediabetes and type 2 diabetes and others don't.  It's clear that certain factors increase the risk, however, including:  Weight. The more fatty tissue you have, the more resistant your cells become to insulin.  Inactivity. The less active you are, the greater your risk. Physical activity helps you control your weight, uses up glucose as energy and makes your cells more sensitive to insulin.  Family history. Your risk increases if a parent or sibling has type 2 diabetes.  Race. Although it's unclear why, people of certain races - including blacks, Hispanics, American Indians and Asian-Americans - are at higher risk.  Age. Your risk increases as you get older. This may be because you tend to exercise less, lose muscle mass and gain weight as you age. But type 2 diabetes is also increasing dramatically among children, adolescents and younger adults.  Gestational diabetes. If you developed gestational diabetes when you were pregnant, your risk of developing prediabetes and type 2 diabetes later increases. If you gave birth to a baby weighing more than 9 pounds (4 kilograms), you're also at risk of type 2 diabetes.  Polycystic ovary syndrome. For women, having polycystic ovary syndrome - a common condition characterized by irregular menstrual periods, excess hair growth and obesity - increases the risk of diabetes.  High blood pressure. Having blood pressure over 140/90 millimeters of mercury (mm Hg) is linked to an increased risk of type 2 diabetes.  Abnormal cholesterol and triglyceride levels. If you have low levels of high-density lipoprotein (HDL), or "good,"  cholesterol, your risk of type 2 diabetes is higher. Triglycerides are another type of fat carried in the blood. People with high levels of triglycerides have an increased risk of type 2 diabetes. Your doctor can let you know what your cholesterol and triglyceride levels are.  A good guide to good carbs: The glycemic index ---If you have diabetes, or at risk for diabetes, you know all too well that when you eat carbohydrates, your blood sugar goes up. The total amount of carbs you consume at a meal or in a snack mostly determines what your blood sugar will do. But the food itself also plays a role. A serving of white rice has almost the same effect as eating pure table sugar - a quick, high spike in blood sugar. A serving of lentils has a slower, smaller effect.  ---Picking good sources of carbs can help you control your blood sugar and your weight. Even if you don't have diabetes, eating healthier carbohydrate-rich foods can help ward off a host of chronic conditions, from heart disease to various cancers to, well, diabetes.  ---One way to choose foods is with the glycemic index (GI). This tool measures how much a food boosts blood sugar.  The glycemic index rates the effect of a specific amount of a food on blood sugar compared with the same amount of pure glucose. A food with a glycemic index of 28 boosts blood sugar only 28% as much as pure glucose. One with a GI of 95 acts like pure glucose.    High glycemic foods result in a quick spike in insulin and blood sugar (also known as blood glucose).  Low  glycemic foods have a slower, smaller effect- these are healthier for you.   Using the glycemic index Using the glycemic index is easy: choose foods in the low GI category instead of those in the high GI category (see below), and go easy on those in between. Low glycemic index (GI of 55 or less): Most fruits and vegetables, beans, minimally processed grains, pasta, low-fat dairy foods, and nuts.    Moderate glycemic index (GI 56 to 69): White and sweet potatoes, corn, white rice, couscous, breakfast cereals such as Cream of Wheat and Mini Wheats.  High glycemic index (GI of 70 or higher): White bread, rice cakes, most crackers, bagels, cakes, doughnuts, croissants, most packaged breakfast cereals. You can see the values for 100 commons foods and get links to more at www.health.CheapToothpicks.si.  Swaps for lowering glycemic index  Instead of this high-glycemic index food Eat this lower-glycemic index food  White rice Brown rice or converted rice  Instant oatmeal Steel-cut oats  Cornflakes Bran flakes  Baked potato Pasta, bulgur  White bread Whole-grain bread  Corn Peas or leafy greens       Prediabetes Eating Plan  Prediabetes--also called impaired glucose tolerance or impaired fasting glucose--is a condition that causes blood sugar (blood glucose) levels to be higher than normal. Following a healthy diet can help to keep prediabetes under control. It can also help to lower the risk of type 2 diabetes and heart disease, which are increased in people who have prediabetes. Along with regular exercise, a healthy diet:  Promotes weight loss.  Helps to control blood sugar levels.  Helps to improve the way that the body uses insulin.   WHAT DO I NEED TO KNOW ABOUT THIS EATING PLAN?   Use the glycemic index (GI) to plan your meals. The index tells you how quickly a food will raise your blood sugar. Choose low-GI foods. These foods take a longer time to raise blood sugar.  Pay close attention to the amount of carbohydrates in the food that you eat. Carbohydrates increase blood sugar levels.  Keep track of how many calories you take in. Eating the right amount of calories will help you to achieve a healthy weight. Losing about 7 percent of your starting weight can help to prevent type 2 diabetes.  You may want to follow a Mediterranean diet. This diet includes a lot of  vegetables, lean meats or fish, whole grains, fruits, and healthy oils and fats.   WHAT FOODS CAN I EAT?  Grains Whole grains, such as whole-wheat or whole-grain breads, crackers, cereals, and pasta. Unsweetened oatmeal. Bulgur. Barley. Quinoa. Brown rice. Corn or whole-wheat flour tortillas or taco shells. Vegetables Lettuce. Spinach. Peas. Beets. Cauliflower. Cabbage. Broccoli. Carrots. Tomatoes. Squash. Eggplant. Herbs. Peppers. Onions. Cucumbers. Brussels sprouts. Fruits Berries. Bananas. Apples. Oranges. Grapes. Papaya. Mango. Pomegranate. Kiwi. Grapefruit. Cherries. Meats and Other Protein Sources Seafood. Lean meats, such as chicken and [place] or lean cuts of pork and beef. Tofu. Eggs. Nuts. Beans. Dairy Low-fat or fat-free dairy products, such as yogurt, cottage cheese, and cheese. Beverages Water. Tea. Coffee. Sugar-free or diet soda. Seltzer water. Milk. Milk alternatives, such as soy or almond milk. Condiments Mustard. Relish. Low-fat, low-sugar ketchup. Low-fat, low-sugar barbecue sauce. Low-fat or fat-free mayonnaise. Sweets and Desserts Sugar-free or low-fat pudding. Sugar-free or low-fat ice cream and other frozen treats. Fats and Oils Avocado. Walnuts. Olive oil. The items listed above may not be a complete list of recommended foods or beverages. Contact your dietitian for more  options.    WHAT FOODS ARE NOT RECOMMENDED?  Grains Refined white flour and flour products, such as bread, pasta, snack foods, and cereals. Beverages Sweetened drinks, such as sweet iced tea and soda. Sweets and Desserts Baked goods, such as cake, cupcakes, pastries, cookies, and cheesecake. The items listed above may not be a complete list of foods and beverages to avoid. Contact your dietitian for more information.   This information is not intended to replace advice given to you by your health care provider. Make sure you discuss any questions you have with your health care provider.     Document Released: 06/04/2014 Document Reviewed: 06/04/2014 Elsevier Interactive Patient Education [employer].

## 2017-04-12 NOTE — Progress Notes (Signed)
Impression and Recommendations:    1. Prediabetes   2. Hypertension, unspecified type- txed by Cardiology per pt   3. Hypothyroidism, unspecified type   4. Benign colonic polyp- found in 07/2011- told f/up Dr Watt Climes 5 yrs   5. Osteoarthritis, unspecified osteoarthritis type, unspecified site   6. Chronic pain of right knee-since age 76 per patient   7. Need for Tdap vaccination   8. Vitamin D insufficiency     1. Prediabetes- A1c from 04-07-17 was 5.7. Dietary and exercise guidelines discussed. Regular follow up 4-6 months. Extensive information on glycemic index foods. Talked to her about importance of nutrition and diet to help prevent onset of diabetics. -Monitor this Q 6 months. -try to exercise and move more. -Cut back on the amount of sweets or how often you eat them. -handouts and information provided.  2. Hypertension- BP well controlled at this time. Sx stable. Continue meds as listed below. Check your BP at home and keep a BP log at home and bring this into next OV.  3. Hypothyroidism- TSH, T3, and T4 were all WNL on 04-07-17. Stable at this time. Continue meds. -refill meds.  4. Benign colonic polyp- Pt is seen by Dr. Watt Climes, GI and she has another appointment with him on 04-26-17 to discuss further steps of colonoscopy. Her last colonoscopy was June 2018 and is due for repeat in 5 years.  5. Osteoarthritis- apply ice to the area, 15-20 minutes, 3-4 times a day. -pt has allergies to acetaminophen and NSAIDs.  6. Chronic pain R knee- per pt, this has been an issue for over 50 years. Told pt we will evaluate and examine her knee in the near future at a next available chronic appointment. -Avoid activities that aggravate your knee, including kneeling, squatting, or standing on your knees.   7. Need for Tdap vaccination- given today. -Vaccine information sheet provided.   8. Vitamin D insufficiency-  -Increase to 2000 IUs day. Can increase this to 5000 IUs if tolerated  well. Recheck in 4-6 months.   -Discussed in great detail recent blood work that was done in office.    Orders Placed This Encounter  Procedures  . Tdap vaccine greater than or equal to 7yo IM    Meds ordered this encounter  Medications  . levothyroxine (SYNTHROID, LEVOTHROID) 25 MCG tablet    Sig: Take 1 tablet (25 mcg total) by mouth daily before breakfast.    Dispense:  90 tablet    Refill:  1    Gross side effects, risk and benefits, and alternatives of medications and treatment plan in general discussed with patient.  Patient is aware that all medications have potential side effects and we are unable to predict every side effect or drug-drug interaction that may occur.   Patient will call with any questions prior to using medication if they have concerns.  Expresses verbal understanding and consents to current therapy and treatment regimen.  No barriers to understanding were identified.  Red flag symptoms and signs discussed in detail.  Patient expressed understanding regarding what to do in case of emergency\urgent symptoms  Please see AVS handed out to patient at the end of our visit for further patient instructions/ counseling done pertaining to today's office visit.   Return for 4-82mo f/up pre-diab etc.    Note: This note was prepared with assistance of Dragon voice recognition software. Occasional wrong-word or sound-a-like substitutions may have occurred due to the inherent limitations of voice recognition software.  This document serves as a record of services personally performed by Mellody Dance, DO. It was created on her behalf by Mayer Masker, a trained medical scribe. The creation of this record is based on the scribe's personal observations and the provider's statements to them.   I have reviewed the above medical documentation for accuracy and completeness and I concur.  Mellody Dance 04/12/17 6:47  PM   ----------------------------------------------------------------------------------------------------------------------------------------------------------------------------------------------------------------------------  Subjective:     HPI: Kimberly Merritt is a 77 y.o. female who presents to [facility] today for issues as discussed below, as well as discussion of recent blood work that was done.   Chest She saw Dr. Percival Spanish, cardiology, on 04-08-17 for her discomfort in her chest. Results were benign. She was reassured by this. He increased carvedilol to 12.5mg  BID which he believes will help with his situation. She is following up with her in 4 months.   10-09-2014 was her last cholesterol panel, but she does not need to have this every year due to no blockages. He did not specify how often she should follow up with him for this.   GI: Dr. Watt Climes, GI. 04-26-17 is her next appointment for discussion about colonoscopy.  Her last colonoscopy was last year in June 2018, and she is due for repeat in 5 years.  Prediabetes- A1c from 04-07-17 was 5.7.  She reports she was craving sugar a few weeks ago, and was also feeling symptoms of dizziness. She has not been that active since October 2018 due to her sinus infection and other issues that have impacted her daily activity.   Vitamin D: She takes 1000 IUs daily. She had never had this monitored before in the past.   R knee She complains of R sided knee swelling, and discomfort. This is chronic and she has had this for 55+ years. She uses ice for her knee if it is swelling. She also applies bengay to the area PRN. Pt cannot take NSAIDs or acetaminophens.    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 Sherwood Gambler in neurosurgery.    Wt Readings from Last 3 Encounters:  04/12/17 130 lb 14.4 oz (59.4 kg)  04/08/17 131 lb 6.4 oz (59.6 kg)  04/05/17 128 lb 6.4 oz (58.2 kg)   BP  Readings from Last 3 Encounters:  04/12/17 123/71  04/08/17 (!) 142/56  04/05/17 127/74   Pulse Readings from Last 3 Encounters:  04/12/17 (!) 55  04/08/17 70  04/05/17 69   BMI Readings from Last 3 Encounters:  04/12/17 22.47 kg/m  04/08/17 22.55 kg/m  04/05/17 22.04 kg/m     Patient Care Team    Relationship Specialty Notifications Start End  Mellody Dance, DO PCP - General Family Medicine  03/29/17   Melrose Nakayama, MD Attending Physician Orthopedic Surgery  05/28/11   Magrinat, Virgie Dad, MD  Hematology and Oncology  05/28/11   Druscilla Brownie, MD Consulting Physician Dermatology  05/28/11   Clarene Essex, MD  Gastroenterology  05/28/11   Rutherford Guys, MD Attending Physician Ophthalmology  05/28/11   Terrance Mass, MD  Obstetrics and Gynecology  08/31/11   Mal Misty, MD Attending Physician Vascular Surgery  08/31/11   Jovita Gamma, MD Consulting Physician Neurosurgery  08/21/13   Minus Breeding, MD Consulting Physician Cardiology  11/29/13   Levy Sjogren, MD Referring Physician Dermatology  04/05/17   Irine Seal, MD Attending Physician Urology  04/05/17   Gershon Crane,  Elta Guadeloupe, MD Consulting Physician Ophthalmology  04/05/17   Mosetta Anis, MD Referring Physician Allergy  04/05/17   Hurley Cisco, MD Consulting Physician Rheumatology  04/05/17      Patient Active Problem List   Diagnosis Date Noted  . Hypertension 04/05/2017    Priority: High  . Benign colonic polyp- found in 07/2011- told f/up Dr Watt Climes 5 yrs 04/05/2017    Priority: Medium  . 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 Nonischemic cardiomyopathy ([place]) 11/29/2013    Priority: Medium  . MONOCLONAL GAMMOPATHY 03/03/2010    Priority: Medium  . Herpes labialis 12/01/2009    Priority: Medium  . Gastroesophageal reflux disease 08/29/2006    Priority: Medium  . 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 Environmental and  seasonal allergies 04/05/2017    Priority: Low  . History of hypoglycemia 04/05/2017    Priority: Low  . Post-menopausal 04/05/2017    Priority: Low  . HYPOTHYROIDISM 08/29/2006    Priority: Low  . OSTEOARTHRITIS 08/29/2006    Priority: Low  . Chronic pain of right knee-since age 29 per patient 04/12/2017  . Prediabetes 04/12/2017  . Vitamin D insufficiency 04/12/2017  . CAD (coronary artery disease) 04/05/2017  . Atherosclerosis of aorta ([place]) 04/05/2017  . 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 History of colonic polyps 04/05/2017  . Scoliosis 04/05/2017  . Ventricular premature depolarization 04/05/2017  . PVC's (premature ventricular contractions) 05/18/2016  . Chronic cough 10/23/2014  . Bradycardia 09/25/2013  . Lip lesion 08/21/2013  . Diverticulosis of colon without hemorrhage 05/02/2013  . Paresthesia 06/20/2012  . Well adult exam 08/31/2011  . Cough 08/10/2011  . Sinusitis, acute frontal 02/15/2011  . Shoulder pain, right 07/09/2010  . LIVER HEMANGIOMA 03/03/2010  . MUSCLE SPASM 03/03/2010  . Disorder of bone and cartilage 11/19/2008  . HEPATIC CYST 08/02/2007  . ALLERGIC RHINITIS 04/25/2007  . HOARSENESS 04/25/2007  . 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 LOW BACK PAIN 08/29/2006  . Pain in Soft Tissues of Limb 08/29/2006    Past Medical history, Surgical history, Family history, Social history, Allergies and Medications have been entered into the medical record, reviewed and changed as needed.    Current Meds  Medication Sig  . acyclovir (ZOVIRAX) 400 MG tablet TAKE 1 TABLET (400 MG TOTAL) BY MOUTH 2 TIMES DAILY.  . carvedilol (COREG) 12.5 MG tablet Take 1 tablet (12.5 mg total) by mouth 2 (two) times daily.  . Cholecalciferol (VITAMIN D3) 1000 UNITS CAPS Take by mouth daily.    . cromolyn (NASALCROM) 5.2 MG/ACT nasal spray Place 1 spray into both nostrils 4 (four) times daily.  Marland Kitchen EPINEPHrine 0.3 mg/0.3 mL IJ SOAJ injection   . fexofenadine (ALLEGRA) 180  MG tablet Take 180 mg by mouth daily.  Marland Kitchen glucosamine-chondroitin 500-400 MG tablet Take 1 tablet by mouth 2 (two) times daily.   Marland Kitchen levothyroxine (SYNTHROID, LEVOTHROID) 25 MCG tablet Take 1 tablet (25 mcg total) by mouth daily before breakfast.  . losartan (COZAAR) 25 MG tablet Take 1 tablet (25 mg total) by mouth daily. KEEP OV.  . NON FORMULARY Allergy injections   . Polyethyl Glycol-Propyl Glycol (SYSTANE ULTRA) 0.4-0.3 % SOLN Apply to eye. 2 drops in each eye 3 to 4 times daily  . ranitidine (ZANTAC) 150 MG tablet Take 150 mg by mouth 2 (two) times daily.  Marland Kitchen SALINE NASAL SPRAY NA Place into the nose. 2 sprays each nostril  every 4 hours  . sucralfate (CARAFATE) 1 g tablet Take 1 g by mouth daily.  . [DISCONTINUED] levothyroxine (SYNTHROID, LEVOTHROID) 25 MCG tablet Take 1 tablet (25 mcg total) by mouth daily before breakfast.    Allergies:  Allergies  Allergen Reactions  . Acetaminophen     REACTION: high dose - tremor  . Alendronate Sodium     Stomach upset.   . Contrast Media [Iodinated Diagnostic Agents]     Syncope.   . Diflunisal     Stomach pain.    . Doxycycline     n/v  . Flexeril [Cyclobenzaprine]     Abdominal pain.    . Nabumetone     Abdominal pain; dark, tarry stool.    . Oxybutynin     Pt does not remember.    . Raloxifene     Unknown.    . Sulfa Antibiotics Nausea Only    Syncope.    . Symbicort [Budesonide-Formoterol Fumarate]     "Jittery-ness."   . Tramadol Hcl     Numbness.    . Diclofenac Palpitations    Dark, tarry stool.    . Latex Rash     Review of Systems:  A fourteen system review of systems was performed and found to be positive as per HPI.   Objective:   Blood pressure 123/71, pulse (!) 55, height 5\' 4"  (1.626 m), weight 130 lb 14.4 oz (59.4 kg), SpO2 99 %. Body mass index is 22.47 kg/m. General:  Well Developed, well nourished, appropriate for stated age.  Neuro:  Alert and oriented,  extra-ocular muscles intact  HEENT:   Normocephalic, atraumatic, neck supple, no carotid bruits appreciated  Skin:  no gross rash, warm, pink. Cardiac:  RRR, S1 S2 Respiratory:  ECTA B/L and A/P, Not using accessory muscles, speaking in full sentences- unlabored. Vascular:  Ext warm, no cyanosis apprec.; cap RF less 2 sec. Psych:  No HI/SI, judgement and insight good, Euthymic mood. Full Affect.

## 2017-04-14 DIAGNOSIS — J301 Allergic rhinitis due to pollen: Secondary | ICD-10-CM | POA: Diagnosis not present

## 2017-04-14 DIAGNOSIS — J3081 Allergic rhinitis due to animal (cat) (dog) hair and dander: Secondary | ICD-10-CM | POA: Diagnosis not present

## 2017-04-14 DIAGNOSIS — J3089 Other allergic rhinitis: Secondary | ICD-10-CM | POA: Diagnosis not present

## 2017-04-20 DIAGNOSIS — J3081 Allergic rhinitis due to animal (cat) (dog) hair and dander: Secondary | ICD-10-CM | POA: Diagnosis not present

## 2017-04-20 DIAGNOSIS — J3089 Other allergic rhinitis: Secondary | ICD-10-CM | POA: Diagnosis not present

## 2017-04-20 DIAGNOSIS — J301 Allergic rhinitis due to pollen: Secondary | ICD-10-CM | POA: Diagnosis not present

## 2017-04-29 ENCOUNTER — Telehealth: Payer: Self-pay

## 2017-04-29 NOTE — Telephone Encounter (Signed)
   [facility] Pre-operative Risk Assessment    Request for surgical clearance:  1. What type of surgery is being performed? colonoscopy  2. When is this surgery scheduled? 06/02/17  3. What type of clearance is required (medical clearance vs. Pharmacy clearance to hold med vs. Both)? Medical  4. Are there any medications that need to be held prior to surgery and how long? No  5. Practice name and name of physician performing surgery? [facility]  Dr.Magod  6. What is your office phone and fax number? Phone # [phone]   Fax # [phone]  7. Anesthesia type (None, local, MAC, general) ? Propofol sedation   Kathyrn Lass 04/29/2017, 1:46 PM  _________________________________________________________________   (provider comments below)

## 2017-04-29 NOTE — Telephone Encounter (Signed)
   Primary Cardiologist: No primary care provider on file.  Chart reviewed as part of pre-operative protocol coverage. Given past medical history and time since last visit, based on ACC/AHA guidelines, Kimberly Merritt would be at acceptable risk for the planned procedure without further cardiovascular testing.   I will route this recommendation to the requesting party via Epic fax function and remove from pre-op pool.  Please call with questions.  Kerin Ransom, PA-C 04/29/2017, 4:37 PM

## 2017-05-02 NOTE — Progress Notes (Signed)
Error. MPulliam, CMA/RT(R)  

## 2017-05-04 DIAGNOSIS — J3081 Allergic rhinitis due to animal (cat) (dog) hair and dander: Secondary | ICD-10-CM | POA: Diagnosis not present

## 2017-05-04 DIAGNOSIS — J301 Allergic rhinitis due to pollen: Secondary | ICD-10-CM | POA: Diagnosis not present

## 2017-05-04 DIAGNOSIS — J3089 Other allergic rhinitis: Secondary | ICD-10-CM | POA: Diagnosis not present

## 2017-05-11 DIAGNOSIS — T781XXD Other adverse food reactions, not elsewhere classified, subsequent encounter: Secondary | ICD-10-CM | POA: Diagnosis not present

## 2017-05-11 DIAGNOSIS — J4520 Mild intermittent asthma, uncomplicated: Secondary | ICD-10-CM | POA: Diagnosis not present

## 2017-05-11 DIAGNOSIS — J3089 Other allergic rhinitis: Secondary | ICD-10-CM | POA: Diagnosis not present

## 2017-05-12 ENCOUNTER — Ambulatory Visit: Payer: Medicare Other | Admitting: Cardiology

## 2017-05-19 DIAGNOSIS — J3081 Allergic rhinitis due to animal (cat) (dog) hair and dander: Secondary | ICD-10-CM | POA: Diagnosis not present

## 2017-05-19 DIAGNOSIS — J3089 Other allergic rhinitis: Secondary | ICD-10-CM | POA: Diagnosis not present

## 2017-05-19 DIAGNOSIS — J301 Allergic rhinitis due to pollen: Secondary | ICD-10-CM | POA: Diagnosis not present

## 2017-06-02 DIAGNOSIS — Z86010 Personal history of colonic polyps: Secondary | ICD-10-CM | POA: Diagnosis not present

## 2017-06-02 DIAGNOSIS — K5730 Diverticulosis of large intestine without perforation or abscess without bleeding: Secondary | ICD-10-CM | POA: Diagnosis not present

## 2017-06-07 DIAGNOSIS — J3089 Other allergic rhinitis: Secondary | ICD-10-CM | POA: Diagnosis not present

## 2017-06-07 DIAGNOSIS — J301 Allergic rhinitis due to pollen: Secondary | ICD-10-CM | POA: Diagnosis not present

## 2017-06-07 DIAGNOSIS — J3081 Allergic rhinitis due to animal (cat) (dog) hair and dander: Secondary | ICD-10-CM | POA: Diagnosis not present

## 2017-06-20 ENCOUNTER — Other Ambulatory Visit: Payer: Self-pay | Admitting: Cardiology

## 2017-06-20 DIAGNOSIS — Z08 Encounter for follow-up examination after completed treatment for malignant neoplasm: Secondary | ICD-10-CM | POA: Diagnosis not present

## 2017-06-20 DIAGNOSIS — C44729 Squamous cell carcinoma of skin of left lower limb, including hip: Secondary | ICD-10-CM | POA: Diagnosis not present

## 2017-06-20 DIAGNOSIS — Z85828 Personal history of other malignant neoplasm of skin: Secondary | ICD-10-CM | POA: Diagnosis not present

## 2017-06-20 DIAGNOSIS — D225 Melanocytic nevi of trunk: Secondary | ICD-10-CM | POA: Diagnosis not present

## 2017-06-20 DIAGNOSIS — L821 Other seborrheic keratosis: Secondary | ICD-10-CM | POA: Diagnosis not present

## 2017-06-20 DIAGNOSIS — C44719 Basal cell carcinoma of skin of left lower limb, including hip: Secondary | ICD-10-CM | POA: Diagnosis not present

## 2017-06-20 DIAGNOSIS — L820 Inflamed seborrheic keratosis: Secondary | ICD-10-CM | POA: Diagnosis not present

## 2017-06-20 DIAGNOSIS — D1801 Hemangioma of skin and subcutaneous tissue: Secondary | ICD-10-CM | POA: Diagnosis not present

## 2017-06-20 NOTE — Telephone Encounter (Signed)
Rx has been sent to the pharmacy electronically. ° °

## 2017-06-24 DIAGNOSIS — J301 Allergic rhinitis due to pollen: Secondary | ICD-10-CM | POA: Diagnosis not present

## 2017-06-24 DIAGNOSIS — J3089 Other allergic rhinitis: Secondary | ICD-10-CM | POA: Diagnosis not present

## 2017-06-24 DIAGNOSIS — J3081 Allergic rhinitis due to animal (cat) (dog) hair and dander: Secondary | ICD-10-CM | POA: Diagnosis not present

## 2017-07-13 DIAGNOSIS — J301 Allergic rhinitis due to pollen: Secondary | ICD-10-CM | POA: Diagnosis not present

## 2017-07-13 DIAGNOSIS — J3081 Allergic rhinitis due to animal (cat) (dog) hair and dander: Secondary | ICD-10-CM | POA: Diagnosis not present

## 2017-07-13 DIAGNOSIS — J3089 Other allergic rhinitis: Secondary | ICD-10-CM | POA: Diagnosis not present

## 2017-07-15 DIAGNOSIS — J3081 Allergic rhinitis due to animal (cat) (dog) hair and dander: Secondary | ICD-10-CM | POA: Diagnosis not present

## 2017-07-15 DIAGNOSIS — J301 Allergic rhinitis due to pollen: Secondary | ICD-10-CM | POA: Diagnosis not present

## 2017-07-15 DIAGNOSIS — J3089 Other allergic rhinitis: Secondary | ICD-10-CM | POA: Diagnosis not present

## 2017-07-19 DIAGNOSIS — J3081 Allergic rhinitis due to animal (cat) (dog) hair and dander: Secondary | ICD-10-CM | POA: Diagnosis not present

## 2017-07-19 DIAGNOSIS — J3089 Other allergic rhinitis: Secondary | ICD-10-CM | POA: Diagnosis not present

## 2017-07-19 DIAGNOSIS — J301 Allergic rhinitis due to pollen: Secondary | ICD-10-CM | POA: Diagnosis not present

## 2017-07-22 DIAGNOSIS — J301 Allergic rhinitis due to pollen: Secondary | ICD-10-CM | POA: Diagnosis not present

## 2017-07-22 DIAGNOSIS — J3081 Allergic rhinitis due to animal (cat) (dog) hair and dander: Secondary | ICD-10-CM | POA: Diagnosis not present

## 2017-07-22 DIAGNOSIS — J3089 Other allergic rhinitis: Secondary | ICD-10-CM | POA: Diagnosis not present

## 2017-07-27 ENCOUNTER — Inpatient Hospital Stay: Payer: Medicare Other | Attending: Oncology

## 2017-07-27 DIAGNOSIS — D472 Monoclonal gammopathy: Secondary | ICD-10-CM | POA: Diagnosis not present

## 2017-07-27 DIAGNOSIS — J3081 Allergic rhinitis due to animal (cat) (dog) hair and dander: Secondary | ICD-10-CM | POA: Diagnosis not present

## 2017-07-27 DIAGNOSIS — J301 Allergic rhinitis due to pollen: Secondary | ICD-10-CM | POA: Diagnosis not present

## 2017-07-27 DIAGNOSIS — J3089 Other allergic rhinitis: Secondary | ICD-10-CM | POA: Diagnosis not present

## 2017-07-27 DIAGNOSIS — M949 Disorder of cartilage, unspecified: Secondary | ICD-10-CM

## 2017-07-27 DIAGNOSIS — M899 Disorder of bone, unspecified: Secondary | ICD-10-CM

## 2017-07-27 LAB — COMPREHENSIVE METABOLIC PANEL
ALT: 15 U/L (ref 0–44)
AST: 17 U/L (ref 15–41)
Albumin: 3.6 g/dL (ref 3.5–5.0)
Alkaline Phosphatase: 79 U/L (ref 38–126)
Anion gap: 7 (ref 5–15)
BUN: 23 mg/dL (ref 8–23)
CO2: 26 mmol/L (ref 22–32)
Calcium: 9.2 mg/dL (ref 8.9–10.3)
Chloride: 105 mmol/L (ref 98–111)
Creatinine, Ser: 0.89 mg/dL (ref 0.44–1.00)
GFR calc Af Amer: >60 mL/min (ref >60)
GFR calc non Af Amer: >60 mL/min (ref >60)
Glucose, Bld: 88 mg/dL (ref 70–99)
Potassium: 4.8 mmol/L (ref 3.5–5.1)
Sodium: 138 mmol/L (ref 135–145)
Total Bilirubin: 0.5 mg/dL (ref 0.3–1.2)
Total Protein: 6.6 g/dL (ref 6.5–8.1)

## 2017-07-27 LAB — CBC WITH DIFFERENTIAL/PLATELET
Basophils Absolute: 0 10*3/uL (ref 0.0–0.1)
Basophils Relative: 1 %
Eosinophils Absolute: 0.1 10*3/uL (ref 0.0–0.5)
Eosinophils Relative: 2 %
HCT: 37.1 % (ref 34.8–46.6)
Hemoglobin: 12.1 g/dL (ref 11.6–15.9)
Lymphocytes Relative: 42 %
Lymphs Abs: 3.1 10*3/uL (ref 0.9–3.3)
MCH: 30.9 pg (ref 25.1–34.0)
MCHC: 32.6 g/dL (ref 31.5–36.0)
MCV: 94.9 fL (ref 79.5–101.0)
Monocytes Absolute: 1 10*3/uL — ABNORMAL HIGH (ref 0.1–0.9)
Monocytes Relative: 15 %
Neutro Abs: 2.8 10*3/uL (ref 1.5–6.5)
Neutrophils Relative %: 40 %
Platelets: 275 10*3/uL (ref 145–400)
RBC: 3.91 MIL/uL (ref 3.70–5.45)
RDW: 12.9 % (ref 11.2–14.5)
WBC: 7.2 10*3/uL (ref 3.9–10.3)

## 2017-07-29 LAB — MULTIPLE MYELOMA PANEL, SERUM
Albumin SerPl Elph-Mcnc: 3.4 g/dL (ref 2.9–4.4)
Albumin/Glob SerPl: 1.4 (ref 0.7–1.7)
Alpha 1: 0.2 g/dL (ref 0.0–0.4)
Alpha2 Glob SerPl Elph-Mcnc: 0.5 g/dL (ref 0.4–1.0)
B-Globulin SerPl Elph-Mcnc: 1.5 g/dL — ABNORMAL HIGH (ref 0.7–1.3)
Gamma Glob SerPl Elph-Mcnc: 0.3 g/dL — ABNORMAL LOW (ref 0.4–1.8)
Globulin, Total: 2.6 g/dL (ref 2.2–3.9)
IgA: 14 mg/dL — ABNORMAL LOW (ref 64–422)
IgG (Immunoglobin G), Serum: 436 mg/dL — ABNORMAL LOW (ref 700–1600)
IgM (Immunoglobulin M), Srm: 1170 mg/dL — ABNORMAL HIGH (ref 26–217)
M Protein SerPl Elph-Mcnc: 0.8 g/dL — ABNORMAL HIGH
Total Protein ELP: 6 g/dL (ref 6.0–8.5)

## 2017-08-01 DIAGNOSIS — Z08 Encounter for follow-up examination after completed treatment for malignant neoplasm: Secondary | ICD-10-CM | POA: Diagnosis not present

## 2017-08-01 DIAGNOSIS — Z85828 Personal history of other malignant neoplasm of skin: Secondary | ICD-10-CM | POA: Diagnosis not present

## 2017-08-01 DIAGNOSIS — L218 Other seborrheic dermatitis: Secondary | ICD-10-CM | POA: Diagnosis not present

## 2017-08-01 DIAGNOSIS — L820 Inflamed seborrheic keratosis: Secondary | ICD-10-CM | POA: Diagnosis not present

## 2017-08-16 ENCOUNTER — Encounter: Payer: Self-pay | Admitting: Family Medicine

## 2017-08-16 ENCOUNTER — Ambulatory Visit: Payer: Medicare Other | Admitting: Family Medicine

## 2017-08-16 VITALS — BP 119/71 | HR 64 | Ht 64.0 in | Wt 127.6 lb

## 2017-08-16 DIAGNOSIS — E559 Vitamin D deficiency, unspecified: Secondary | ICD-10-CM

## 2017-08-16 DIAGNOSIS — J3089 Other allergic rhinitis: Secondary | ICD-10-CM | POA: Diagnosis not present

## 2017-08-16 DIAGNOSIS — M159 Polyosteoarthritis, unspecified: Secondary | ICD-10-CM

## 2017-08-16 DIAGNOSIS — I10 Essential (primary) hypertension: Secondary | ICD-10-CM | POA: Diagnosis not present

## 2017-08-16 DIAGNOSIS — M7061 Trochanteric bursitis, right hip: Secondary | ICD-10-CM | POA: Diagnosis not present

## 2017-08-16 DIAGNOSIS — E039 Hypothyroidism, unspecified: Secondary | ICD-10-CM | POA: Diagnosis not present

## 2017-08-16 DIAGNOSIS — R7303 Prediabetes: Secondary | ICD-10-CM | POA: Diagnosis not present

## 2017-08-16 DIAGNOSIS — J3081 Allergic rhinitis due to animal (cat) (dog) hair and dander: Secondary | ICD-10-CM | POA: Diagnosis not present

## 2017-08-16 DIAGNOSIS — J301 Allergic rhinitis due to pollen: Secondary | ICD-10-CM | POA: Diagnosis not present

## 2017-08-16 NOTE — Patient Instructions (Signed)
How to Increase Your Level of Physical Activity  Getting regular physical activity is important for your overall health and well-being. Most people do not get enough exercise. There are easy ways to increase your level of physical activity, even if you have not been very active in the past or you are just starting out. Why is physical activity important? Physical activity has many short-term and long-term health benefits. Regular exercise can:  Help you lose weight or maintain a healthy weight.  Strengthen your muscles and bones.  Boost your mood and improve self-esteem.  Reduce your risk of certain long-term (chronic) diseases, like heart disease, cancer, and diabetes.  Help you stay capable of walking and moving around (mobile) as you age.  Prevent accidents, such as falls, as you age.  Increase life expectancy.  What are the benefits of being physically active on a regular basis? In addition to improving your physical health, being physically active on most days of the week can help you in ways that you may not expect. Benefits of regular physical activity may include:  Feeling good about your body.  Being able to move around more easily and for longer periods of time without getting tired (increased stamina).  Finding new sources of fun and enjoyment.  Meeting new people who share a common interest.  Being able to fight off illness better (enhanced immunity).  Being able to sleep better.  What can happen if I am not physically active on a regular basis? Not getting enough physical activity can lead to an unhealthy lifestyle and future health problems. This can increase your chances of:  Becoming overweight or obese.  Becoming sick.  Developing chronic illnesses, like heart disease or diabetes.  Having mental health problems, like depression or anxiety.  Having sleep problems.  Having trouble walking or getting yourself around (reduced mobility).  Injuring  yourself in a fall as you get older.  What steps can I take to be more physically active?  Check with your health care provider about how to get started. Ask your health care provider what activities are safe for you.  Start out slowly. Walking or doing some simple chair exercises is a good place to start, especially if you have not been active before or for a long time.  Try to find activities that you enjoy. You are more likely to commit to an exercise routine if it does not feel like a chore.  If you have bone or joint problems, choose low-impact exercises, like walking or swimming.  Include physical activity in your everyday routine.  Invite friends or family members to exercise with you. This also will help you commit to your workout plan.  Set goals that you can work toward.  Aim for at least 150 minutes of moderate-intensity exercise each week. Examples of moderate-intensity exercise include walking or riding a bike. Where to find more information:  Centers for Disease Control and Prevention: [employer]lingG[employer].is  [employer] on [employer] www.http://villegas.org/  ChooseMyPlate: [employer] Contact a health care provider if:  You have headaches, muscle aches, or joint pain.  You feel dizzy or light-headed while exercising.  You faint.  You have chest pain while exercising. Summary  Exercise benefits your mind and body at any age, even if you are just starting out.  If you have a chronic illness or have not been active for a while, check with your health care provider before increasing your physical activity.  Choose activities that are safe  and enjoyable for you.Ask your health care provider what activities are safe for you.  Start slowly. Tell your health care provider if you have problems as you start to increase your activity level. This information is not intended to replace advice  given to you by your health care provider. Make sure you discuss any questions you have with your health care provider. Document Released: 01/08/2016 Document Revised: 01/08/2016 Document Reviewed: 01/08/2016    Risk factors for prediabetes and type 2 diabetes  Researchers don't fully understand why some people develop prediabetes and type 2 diabetes and others don't.  It's clear that certain factors increase the risk, however, including:  Weight. The more fatty tissue you have, the more resistant your cells become to insulin.  Inactivity. The less active you are, the greater your risk. Physical activity helps you control your weight, uses up glucose as energy and makes your cells more sensitive to insulin.  Family history. Your risk increases if a parent or sibling has type 2 diabetes.  Race. Although it's unclear why, people of certain races - including blacks, Hispanics, American Indians and Asian-Americans - are at higher risk.  Age. Your risk increases as you get older. This may be because you tend to exercise less, lose muscle mass and gain weight as you age. But type 2 diabetes is also increasing dramatically among children, adolescents and younger adults.  Gestational diabetes. If you developed gestational diabetes when you were pregnant, your risk of developing prediabetes and type 2 diabetes later increases. If you gave birth to a baby weighing more than 9 pounds (4 kilograms), you're also at risk of type 2 diabetes.  Polycystic ovary syndrome. For women, having polycystic ovary syndrome - a common condition characterized by irregular menstrual periods, excess hair growth and obesity - increases the risk of diabetes.  High blood pressure. Having blood pressure over 140/90 millimeters of mercury (mm Hg) is linked to an increased risk of type 2 diabetes.  Abnormal cholesterol and triglyceride levels. If you have low levels of high-density lipoprotein (HDL), or "good," cholesterol, your risk  of type 2 diabetes is higher. Triglycerides are another type of fat carried in the blood. People with high levels of triglycerides have an increased risk of type 2 diabetes. Your doctor can let you know what your cholesterol and triglyceride levels are.  A good guide to good carbs: The glycemic index ---If you have diabetes, or at risk for diabetes, you know all too well that when you eat carbohydrates, your blood sugar goes up. The total amount of carbs you consume at a meal or in a snack mostly determines what your blood sugar will do. But the food itself also plays a role. A serving of white rice has almost the same effect as eating pure table sugar - a quick, high spike in blood sugar. A serving of lentils has a slower, smaller effect.  ---Picking good sources of carbs can help you control your blood sugar and your weight. Even if you don't have diabetes, eating healthier carbohydrate-rich foods can help ward off a host of chronic conditions, from heart disease to various cancers to, well, diabetes.  ---One way to choose foods is with the glycemic index (GI). This tool measures how much a food boosts blood sugar.  The glycemic index rates the effect of a specific amount of a food on blood sugar compared with the same amount of pure glucose. A food with a glycemic index of 28 boosts blood sugar only  28% as much as pure glucose. One with a GI of 95 acts like pure glucose.    High glycemic foods result in a quick spike in insulin and blood sugar (also known as blood glucose).  Low glycemic foods have a slower, smaller effect- these are healthier for you.   Using the glycemic index Using the glycemic index is easy: choose foods in the low GI category instead of those in the high GI category (see below), and go easy on those in between. Low glycemic index (GI of 55 or less): Most fruits and vegetables, beans, minimally processed grains, pasta, low-fat dairy foods, and nuts.  Moderate glycemic index  (GI 56 to 69): White and sweet potatoes, corn, white rice, couscous, breakfast cereals such as Cream of Wheat and Mini Wheats.  High glycemic index (GI of 70 or higher): White bread, rice cakes, most crackers, bagels, cakes, doughnuts, croissants, most packaged breakfast cereals. You can see the values for 100 commons foods and get links to more at www.health.CheapToothpicks.si.  Swaps for lowering glycemic index  Instead of this high-glycemic index food Eat this lower-glycemic index food  White rice Brown rice or converted rice  Instant oatmeal Steel-cut oats  Cornflakes Bran flakes  Baked potato Pasta, bulgur  White bread Whole-grain bread  Corn Peas or leafy greens       Prediabetes Eating Plan  Prediabetes--also called impaired glucose tolerance or impaired fasting glucose--is a condition that causes blood sugar (blood glucose) levels to be higher than normal. Following a healthy diet can help to keep prediabetes under control. It can also help to lower the risk of type 2 diabetes and heart disease, which are increased in people who have prediabetes. Along with regular exercise, a healthy diet:  Promotes weight loss.  Helps to control blood sugar levels.  Helps to improve the way that the body uses insulin.   WHAT DO I NEED TO KNOW ABOUT THIS EATING PLAN?   Use the glycemic index (GI) to plan your meals. The index tells you how quickly a food will raise your blood sugar. Choose low-GI foods. These foods take a longer time to raise blood sugar.  Pay close attention to the amount of carbohydrates in the food that you eat. Carbohydrates increase blood sugar levels.  Keep track of how many calories you take in. Eating the right amount of calories will help you to achieve a healthy weight. Losing about 7 percent of your starting weight can help to prevent type 2 diabetes.  You may want to follow a Mediterranean diet. This diet includes a lot of vegetables, lean meats or fish,  whole grains, fruits, and healthy oils and fats.   WHAT FOODS CAN I EAT?  Grains Whole grains, such as whole-wheat or whole-grain breads, crackers, cereals, and pasta. Unsweetened oatmeal. Bulgur. Barley. Quinoa. Brown rice. Corn or whole-wheat flour tortillas or taco shells. Vegetables Lettuce. Spinach. Peas. Beets. Cauliflower. Cabbage. Broccoli. Carrots. Tomatoes. Squash. Eggplant. Herbs. Peppers. Onions. Cucumbers. Brussels sprouts. Fruits Berries. Bananas. Apples. Oranges. Grapes. Papaya. Mango. Pomegranate. Kiwi. Grapefruit. Cherries. Meats and Other Protein Sources Seafood. Lean meats, such as chicken and [place] or lean cuts of pork and beef. Tofu. Eggs. Nuts. Beans. Dairy Low-fat or fat-free dairy products, such as yogurt, cottage cheese, and cheese. Beverages Water. Tea. Coffee. Sugar-free or diet soda. Seltzer water. Milk. Milk alternatives, such as soy or almond milk. Condiments Mustard. Relish. Low-fat, low-sugar ketchup. Low-fat, low-sugar barbecue sauce. Low-fat or fat-free mayonnaise. Sweets and Desserts Sugar-free or  low-fat pudding. Sugar-free or low-fat ice cream and other frozen treats. Fats and Oils Avocado. Walnuts. Olive oil. The items listed above may not be a complete list of recommended foods or beverages. Contact your dietitian for more options.    WHAT FOODS ARE NOT RECOMMENDED?  Grains Refined white flour and flour products, such as bread, pasta, snack foods, and cereals. Beverages Sweetened drinks, such as sweet iced tea and soda. Sweets and Desserts Baked goods, such as cake, cupcakes, pastries, cookies, and cheesecake. The items listed above may not be a complete list of foods and beverages to avoid. Contact your dietitian for more information.   This information is not intended to replace advice given to you by your health care provider. Make sure you discuss any questions you have with your health care provider.   Document Released: 06/04/2014  Document Reviewed: 06/04/2014 Elsevier Interactive Patient Education [employer].

## 2017-08-16 NOTE — Progress Notes (Signed)
Impression and Recommendations:    1. Prediabetes   2. Hypertension, unspecified type- txed by Cardiology per pt   3. Hypothyroidism (acquired)   4. Vitamin D insufficiency   5. Generalized osteoarthritis     1. Prediabetes - Dietary and exercise guidelines reviewed and discussed.  - Continue to return for regular follow up 4-6 months.   - Counseled patient on pathophysiology of DM and prevention, and discussed various treatment options, which often includes dietary and lifestyle modifications as first line.  Importance of low carb/ketogenic diet discussed with patient in addition to regular exercise.   - Information reviewed on low glycemic index foods. Emphasized the importance of nutrition and diet to help prevent onset of diabetes.  - Encouraged the importance of formal exercise.  2. Hypertension - BP well controlled at this time. Sx stable.  - Continue meds as listed below.   - Ambulatory blood pressure monitoring encouraged.  Patient should check her BP at home, keep a BP log, and bring this into next OV.  3. Hypothyroidism - TSH, T3, and T4 were all WNL on 04/07/17.  Continue meds.  4. Benign colonic polyp - Pt is seen by Dr. Watt Climes, GI.  Last colonoscopy was June 2018 and is due for repeat in 5 years.  5. Seasonal Allergies - Advised the patient to begin using AYR or Neilmed sinus rinses BID followed by flonase BID (one spray to each nostril). Advised that the patient may also incorporate allegra or claritin PRN.   - Continue to follow up with allergist for chronic maintenance and care.  If symptoms flare, call allergist and return for follow-up and treatment.  - Patient declined referral to ENT.  She knows to let us know in the future if she would like this referral.  6. Osteoarthritis - Walk more and obtain more exercise.  Continue glucosamine-chondroitin.  - Continue to manage pain by applying ice to areas of complaint, 15-20 minutes, 3-4 times a day.  Pt  with allergies to acetaminophen and NSAIDs.  7. Chronic pain R knee - Per pt in past, this has been an issue for over 50 years. Told pt we will evaluate and examine her knee in the near future at a next available chronic appointment.  - Continue to avoid activities that aggravate your knee, including kneeling, squatting, or standing on your knees.   8. Vitamin D Insufficiency  - Continue at 2000 IUs day.   - Can increase this to 5000 IUs if tolerated well. - Re-check today.  9. BMI Counseling Explained to patient what BMI refers to, and what it means medically.    Told patient to think about it as a "medical risk stratification measurement" and how increasing BMI is associated with increasing risk/ or worsening state of various diseases such as hypertension, hyperlipidemia, diabetes, premature OA, depression etc.  American Heart Association guidelines for healthy diet, basically Mediterranean diet, and exercise guidelines of 30 minutes 5 days per week or more discussed in detail.  Health counseling performed.  All questions answered.  10. Lifestyle & Preventative Health Maintenance - Advised patient to continue working toward exercising to improve overall mental, physical, and emotional health.    - Engage in daily physical activity, to a goal of 30 minutes per day.  Recommended that the patient eventually strive for at least 150 minutes of moderate cardiovascular activity per week according to guidelines established by the [facility].   - Healthy dietary habits encouraged, including low-carb, and high amounts  of lean protein in diet.   - Patient should also consume adequate amounts of water - half of body weight in oz of water per day.  11. Follow-Up - Blood work obtained today, including Vitamin D and A1c.  - Continue to return for regularly scheduled chronic follow-up in 4-6 months.   Education and routine counseling performed. Handouts provided.  Orders Placed This Encounter    Procedures  . VITAMIN D 25 Hydroxy (Vit-D Deficiency, Fractures)  . Hemoglobin A1c    No orders of the defined types were placed in this encounter.     The patient was counseled, risk factors were discussed, anticipatory guidance given.  Gross side effects, risk and benefits, and alternatives of medications discussed with patient.  Patient is aware that all medications have potential side effects and we are unable to predict every side effect or drug-drug interaction that may occur.  Expresses verbal understanding and consents to current therapy plan and treatment regimen.  Return for 47mo- chronic care; sooner if problems/ concerns.  Please see AVS handed out to patient at the end of our visit for further patient instructions/ counseling done pertaining to today's office visit.    Note: This document was prepared using Dragon voice recognition software and may include unintentional dictation errors.  This document serves as a record of services personally performed by Mellody Dance, DO. It was created on her behalf by [name], a trained medical scribe. The creation of this record is based on the scribe's personal observations and the provider's statements to them.   I have reviewed the above medical documentation for accuracy and completeness and I concur.  Mellody Dance 08/16/17 1:21 PM    Subjective:    HPI: Kimberly Merritt is a 77 y.o. female who presents to [place] at [facility] today for follow up for HTN.  Here today for prediabetes, Vitamin D, and hypertension.  States "my body is just worn out."  Patient states "I go to bed tired and I wake up tired."  She does not formally exercise.  Seasonal Allergies Continues on allegra every day and uses saline spray.  Patient follows up with an allergist.  States she can "only see him once a year" because he "refused to see her" last time she tried to visit acutely.  Prediabetes States she's tried  to watch her sugar intake and has cut back on bread.   Note she still has some, but she's made an effort to cut back.  States some days that she's been unsteady, but denies dizziness.  Believes this has been due to general tiredness, in addition to summer air quality.  HTN:  -  Her blood pressure has been controlled at home.   Continues to follow up with cardiologist, Dr. Percival Spanish.  - Patient reports good compliance with blood pressure medications. She's been on carvedilol for the past four months.  - Denies medication S-E   - Smoking Status noted   - She denies new onset of: chest pain, exercise intolerance, shortness of breath, dizziness, visual changes, headache, lower extremity swelling or claudication.    Last 3 blood pressure readings in our office are as follows: BP Readings from Last 3 Encounters:  08/16/17 119/71  04/12/17 123/71  04/08/17 (!) 142/56    Pulse Readings from Last 3 Encounters:  08/16/17 64  04/12/17 (!) 55  04/08/17 70    Filed Weights   08/16/17 1028  Weight: 127 lb 9.6 oz (57.9 kg)  Patient Care Team    Relationship Specialty Notifications Start End  Mellody Dance, DO PCP - General Family Medicine  03/29/17   Melrose Nakayama, MD Attending Physician Orthopedic Surgery  05/28/11   Magrinat, Virgie Dad, MD  Hematology and Oncology  05/28/11   Druscilla Brownie, MD Consulting Physician Dermatology  05/28/11   Clarene Essex, MD  Gastroenterology  05/28/11   Rutherford Guys, MD Attending Physician Ophthalmology  05/28/11   Terrance Mass, MD  Obstetrics and Gynecology  08/31/11   Mal Misty, MD Attending Physician Vascular Surgery  08/31/11   Jovita Gamma, MD Consulting Physician Neurosurgery  08/21/13   Minus Breeding, MD Consulting Physician Cardiology  11/29/13   Levy Sjogren, MD Referring Physician Dermatology  04/05/17   Irine Seal, MD Attending Physician Urology  04/05/17   Rutherford Guys, MD Consulting Physician  Ophthalmology  04/05/17   Mosetta Anis, MD Referring Physician Allergy  04/05/17   Hurley Cisco, MD Consulting Physician Rheumatology  04/05/17      Lab Results  Component Value Date   CREATININE 0.89 07/27/2017   BUN 23 07/27/2017   NA 138 07/27/2017   K 4.8 07/27/2017   CL 105 07/27/2017   CO2 26 07/27/2017    Lab Results  Component Value Date   CHOL 171 06/08/2011   CHOL 197 02/24/2010   CHOL 231 (HH) 02/05/2008    Lab Results  Component Value Date   HDL 70.90 06/08/2011   HDL 69.30 02/24/2010   HDL 80.8 02/05/2008    Lab Results  Component Value Date   LDLCALC 94 06/08/2011   LDLCALC 116 (H) 02/24/2010    Lab Results  Component Value Date   TRIG 32.0 06/08/2011   TRIG 60.0 02/24/2010   TRIG 56 02/05/2008    Lab Results  Component Value Date   CHOLHDL 2 06/08/2011   CHOLHDL 3 02/24/2010   CHOLHDL 2.9 CALC 02/05/2008    Lab Results  Component Value Date   LDLDIRECT 131.0 02/05/2008   ===================================================================   Patient Active Problem List   Diagnosis Date Noted  . Hypertension 04/05/2017    Priority: High  . Benign colonic polyp- found in 07/2011- told f/up Dr Watt Climes 5 yrs 04/05/2017    Priority: Medium  . 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 Nonischemic cardiomyopathy ([place]) 11/29/2013    Priority: Medium  . MONOCLONAL GAMMOPATHY 03/03/2010    Priority: Medium  . Herpes labialis 12/01/2009    Priority: Medium  . Gastroesophageal reflux disease 08/29/2006    Priority: Medium  . disabled status- since age 53 due to RA in [place] 04/05/2017    Priority: Low  . Environmental and seasonal allergies 04/05/2017    Priority: Low  . History of hypoglycemia 04/05/2017    Priority: Low  . Post-menopausal 04/05/2017    Priority: Low  . Hypothyroidism (acquired) 08/29/2006    Priority: Low  . Generalized osteoarthritis 08/29/2006    Priority: Low  . Chronic pain of right  knee-since age 43 per patient 04/12/2017  . Prediabetes 04/12/2017  . Vitamin D insufficiency 04/12/2017  . CAD (coronary artery disease) 04/05/2017  . Atherosclerosis of aorta ([place]) 04/05/2017  . 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 History of colonic polyps 04/05/2017  . Scoliosis 04/05/2017  . Ventricular premature depolarization 04/05/2017  . PVC's (premature ventricular contractions) 05/18/2016  . Chronic cough 10/23/2014  . Bradycardia 09/25/2013  . Lip lesion 08/21/2013  . Diverticulosis of colon without hemorrhage 05/02/2013  . Paresthesia  06/20/2012  . Well adult exam 08/31/2011  . Cough 08/10/2011  . Sinusitis, acute frontal 02/15/2011  . Shoulder pain, right 07/09/2010  . LIVER HEMANGIOMA 03/03/2010  . MUSCLE SPASM 03/03/2010  . Disorder of bone and cartilage 11/19/2008  . HEPATIC CYST 08/02/2007  . ALLERGIC RHINITIS 04/25/2007  . HOARSENESS 04/25/2007  . 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 LOW BACK PAIN 08/29/2006  . Pain in Soft Tissues of Limb 08/29/2006     Past Medical History:  Diagnosis Date  . Allergy   . CAD (coronary artery disease)    Nonobstructive. LAD 30% stenosis, small PDA of the circumflex 70% stenosis, right coronary artery 40% stenosis. Catheterization September 2015  . DEPRESSION 08/29/2006  . Diverticulosis   . GERD 08/29/2006  . HEPATIC CYST 08/02/2007  . Hypertension   . HYPOTHYROIDISM 08/29/2006  . LIVER HEMANGIOMA 03/03/2010  . LOW BACK PAIN 08/29/2006  . 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 MUSCLE SPASM 03/03/2010  . Nonischemic cardiomyopathy (HCC)    EF 30%  . OSTEOARTHRITIS 08/29/2006  . OSTEOPENIA 11/19/2008  . PARESTHESIA 03/05/2009  . PVC's (premature ventricular contractions)   . Scoliosis   . VARICOSE VEINS, LOWER EXTREMITIES 08/29/2006     Past Surgical History:  Procedure Laterality Date  . ABDOMINAL HYSTERECTOMY  1981  . APPENDECTOMY    . BACK SURGERY  2010,2006   lumb fusion  . BUNIONECTOMY     both  . BUNIONECTOMY  Right 1985  . CARPAL TUNNEL RELEASE     both  . CARPAL TUNNEL RELEASE Right 1992  . CARPAL TUNNEL RELEASE Left 1992  . CATARACT EXTRACTION     both  . CATARACT EXTRACTION Right 2008  . CATARACT EXTRACTION Left 2008  . HAMMER TOE SURGERY Left 1987  . HAMMER TOE SURGERY Right 1986  . LEFT AND RIGHT HEART CATHETERIZATION WITH CORONARY ANGIOGRAM N/A 10/04/2013   Procedure: LEFT AND RIGHT HEART CATHETERIZATION WITH CORONARY ANGIOGRAM;  Surgeon: Leonie Man, MD;  Location[facility] LAB;  Service: Cardiovascular;  Laterality: N/A;  . LUMBAR FUSION  2006  . LUMBAR FUSION  2010  . OOPHORECTOMY  1967  . OVARIAN CYST REMOVAL    . ROTATOR CUFF REPAIR Right 2013  . SHOULDER ARTHROSCOPY  02/09/2011   Procedure: ARTHROSCOPY SHOULDER;  Surgeon: Hessie Dibble, MD;  Location: [place];  Service: Orthopedics;  Laterality: Right;  right shoulder arthroscopy subacromial decompression with rotator cuff repair  . TONSILLECTOMY       Family History  Problem Relation Age of Onset  . Stroke Mother   . Cancer Father   . Emphysema Father   . Hypertension Other   . Breast cancer Neg Hx      Social History   Substance and Sexual Activity  Drug Use No  ,  Social History   Substance and Sexual Activity  Alcohol Use No  . Alcohol/week: 0.0 oz  ,  Social History   Tobacco Use  Smoking Status Never Smoker  Smokeless Tobacco Never Used  ,    Current Outpatient Medications on File Prior to Visit  Medication Sig Dispense Refill  . acyclovir (ZOVIRAX) 400 MG tablet TAKE 1 TABLET (400 MG TOTAL) BY MOUTH 2 TIMES DAILY. 60 tablet 4  . carvedilol (COREG) 12.5 MG tablet Take 1 tablet (12.5 mg total) by mouth 2 (two) times daily. 180 tablet 3  . Cholecalciferol (VITAMIN D3) 1000 UNITS CAPS Take 2 capsules by mouth daily.     . cromolyn (  NASALCROM) 5.2 MG/ACT nasal spray Place 1 spray into both nostrils 4 (four) times daily.    Marland Kitchen EPINEPHrine 0.3 mg/0.3 mL IJ SOAJ injection   1  .  fexofenadine (ALLEGRA) 180 MG tablet Take 180 mg by mouth daily.    Marland Kitchen glucosamine-chondroitin 500-400 MG tablet Take 1 tablet by mouth 2 (two) times daily.     Marland Kitchen levothyroxine (SYNTHROID, LEVOTHROID) 25 MCG tablet Take 1 tablet (25 mcg total) by mouth daily before breakfast. 90 tablet 1  . losartan (COZAAR) 25 MG tablet Take 1 tablet (25 mg total) by mouth daily. 90 tablet 1  . NON FORMULARY Allergy injections     . Polyethyl Glycol-Propyl Glycol (SYSTANE ULTRA) 0.4-0.3 % SOLN Apply to eye. 2 drops in each eye 3 to 4 times daily    . ranitidine (ZANTAC) 150 MG tablet Take 150 mg by mouth 2 (two) times daily.    Marland Kitchen SALINE NASAL SPRAY NA Place into the nose. 2 sprays each nostril every 4 hours    . sucralfate (CARAFATE) 1 g tablet Take 1 g by mouth daily.     No current facility-administered medications on file prior to visit.      Allergies  Allergen Reactions  . Acetaminophen     REACTION: high dose - tremor  . Alendronate Sodium     Stomach upset.   . Contrast Media [Iodinated Diagnostic Agents]     Syncope.   . Diflunisal     Stomach pain.    . Doxycycline     n/v  . Flexeril [Cyclobenzaprine]     Abdominal pain.    . Nabumetone     Abdominal pain; dark, tarry stool.    . Oxybutynin     Pt does not remember.    . Raloxifene     Unknown.    . Sulfa Antibiotics Nausea Only    Syncope.    . Symbicort [Budesonide-Formoterol Fumarate]     "Jittery-ness."   . Tramadol Hcl     Numbness.    . Diclofenac Palpitations    Dark, tarry stool.    . Latex Rash     Review of Systems:   General:  Denies fever, chills Optho/Auditory:   Denies visual changes, blurred vision Respiratory:   Denies SOB, cough, wheeze, DIB  Cardiovascular:   Denies chest pain, palpitations, painful respirations Gastrointestinal:   Denies nausea, vomiting, diarrhea.  Endocrine:     Denies new hot or cold intolerance Musculoskeletal:  Denies joint swelling, gait issues, or new unexplained myalgias/  arthralgias Skin:  Denies rash, suspicious lesions  Neurological:    Denies dizziness, unexplained weakness, numbness  Psychiatric/Behavioral:   Denies mood changes  Objective:    Blood pressure 119/71, pulse 64, height 5\' 4"  (1.626 m), weight 127 lb 9.6 oz (57.9 kg), SpO2 98 %.  Body mass index is 21.9 kg/m.  General: Well Developed, well nourished, and in no acute distress.  HEENT: Normocephalic, atraumatic, pupils equal round reactive to light, neck supple, No carotid bruits, no JVD Skin: Warm and dry, cap RF less 2 sec Cardiac:  faint cartoid bruit on left.  Regular rate and rhythm, S1, S2 WNL's, no murmurs rubs or gallops Respiratory: ECTA B/L, Not using accessory muscles, speaking in full sentences. NeuroM-Sk: Ambulates w/o assistance, moves ext * 4 w/o difficulty, sensation grossly intact.  Ext: scant edema b/l lower ext Psych: No HI/SI, judgement and insight good, Euthymic mood. Full Affect.

## 2017-08-17 LAB — HEMOGLOBIN A1C
Est. average glucose Bld gHb Est-mCnc: 123 mg/dL
Hgb A1c MFr Bld: 5.9 % — ABNORMAL HIGH (ref 4.8–5.6)

## 2017-08-17 LAB — VITAMIN D 25 HYDROXY (VIT D DEFICIENCY, FRACTURES): Vit D, 25-Hydroxy: 45.7 ng/mL (ref 30.0–100.0)

## 2017-08-21 NOTE — Progress Notes (Signed)
HPI The patient presents for follow up of cardiomyopathy. She has been noted to have an ejection fraction of 30%. This was brought to attention after she was found to have frequent premature ventricular contractions. Cardiac catheterization demonstrated nonobstructive and small vessel disease.   EF was 55% on echo with moderate TR in April 2018.   Her storage shed blew down last night.  She had a huge walnut tree fall.  From a cardiac standpoint she denies any acute symptoms.  She has some rare palpitations.  She has no complaints related to this.  The patient denies any new symptoms such as chest discomfort, neck or arm discomfort. There has been no new shortness of breath, PND or orthopnea. There have been no reported palpitations, presyncope or syncope.  She did have some joint pain when I increased the Coreg at the last visit but she eventually improved from this.      Allergies  Allergen Reactions  . Acetaminophen     REACTION: high dose - tremor  . Alendronate Sodium     Stomach upset.   . Contrast Media [Iodinated Diagnostic Agents]     Syncope.   . Diflunisal     Stomach pain.    . Doxycycline     n/v  . Flexeril [Cyclobenzaprine]     Abdominal pain.    . Nabumetone     Abdominal pain; dark, tarry stool.    . Oxybutynin     Pt does not remember.    . Raloxifene     Unknown.    . Sulfa Antibiotics Nausea Only    Syncope.    . Symbicort [Budesonide-Formoterol Fumarate]     "Jittery-ness."   . Tramadol Hcl     Numbness.    . Diclofenac Palpitations    Dark, tarry stool.    . Latex Rash    Current Outpatient Medications  Medication Sig Dispense Refill  . acyclovir (ZOVIRAX) 400 MG tablet TAKE 1 TABLET (400 MG TOTAL) BY MOUTH 2 TIMES DAILY. 60 tablet 4  . carvedilol (COREG) 12.5 MG tablet Take 1 tablet (12.5 mg total) by mouth 2 (two) times daily. 180 tablet 3  . Cholecalciferol (VITAMIN D3) 1000 UNITS CAPS Take 2 capsules by mouth daily.     . cromolyn (NASALCROM)  5.2 MG/ACT nasal spray Place 1 spray into both nostrils 4 (four) times daily.    Marland Kitchen EPINEPHrine 0.3 mg/0.3 mL IJ SOAJ injection   1  . fexofenadine (ALLEGRA) 180 MG tablet Take 180 mg by mouth daily.    Marland Kitchen glucosamine-chondroitin 500-400 MG tablet Take 1 tablet by mouth 2 (two) times daily.     Marland Kitchen levothyroxine (SYNTHROID, LEVOTHROID) 25 MCG tablet Take 1 tablet (25 mcg total) by mouth daily before breakfast. 90 tablet 1  . losartan (COZAAR) 25 MG tablet Take 1 tablet (25 mg total) by mouth daily. 90 tablet 1  . NON FORMULARY Allergy injections     . Polyethyl Glycol-Propyl Glycol (SYSTANE ULTRA) 0.4-0.3 % SOLN Apply to eye. 2 drops in each eye 3 to 4 times daily    . ranitidine (ZANTAC) 150 MG tablet Take 150 mg by mouth 2 (two) times daily.    Marland Kitchen SALINE NASAL SPRAY NA Place into the nose. 2 sprays each nostril every 4 hours    . sucralfate (CARAFATE) 1 g tablet Take 1 g by mouth daily.     No current facility-administered medications for this visit.     Past Medical History:  Diagnosis Date  . Allergy   . CAD (coronary artery disease)    Nonobstructive. LAD 30% stenosis, small PDA of the circumflex 70% stenosis, right coronary artery 40% stenosis. Catheterization September 2015  . DEPRESSION 08/29/2006  . Diverticulosis   . GERD 08/29/2006  . HEPATIC CYST 08/02/2007  . Hypertension   . HYPOTHYROIDISM 08/29/2006  . LIVER HEMANGIOMA 03/03/2010  . LOW BACK PAIN 08/29/2006  . 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 MUSCLE SPASM 03/03/2010  . Nonischemic cardiomyopathy (HCC)    EF 30%  . OSTEOARTHRITIS 08/29/2006  . OSTEOPENIA 11/19/2008  . PARESTHESIA 03/05/2009  . PVC's (premature ventricular contractions)   . Scoliosis   . VARICOSE VEINS, LOWER EXTREMITIES 08/29/2006    Past Surgical History:  Procedure Laterality Date  . ABDOMINAL HYSTERECTOMY  1981  . APPENDECTOMY    . BACK SURGERY  2010,2006   lumb fusion  . BUNIONECTOMY     both  . BUNIONECTOMY Right 1985  . CARPAL TUNNEL RELEASE      both  . CARPAL TUNNEL RELEASE Right 1992  . CARPAL TUNNEL RELEASE Left 1992  . CATARACT EXTRACTION     both  . CATARACT EXTRACTION Right 2008  . CATARACT EXTRACTION Left 2008  . HAMMER TOE SURGERY Left 1987  . HAMMER TOE SURGERY Right 1986  . LEFT AND RIGHT HEART CATHETERIZATION WITH CORONARY ANGIOGRAM N/A 10/04/2013   Procedure: LEFT AND RIGHT HEART CATHETERIZATION WITH CORONARY ANGIOGRAM;  Surgeon: Leonie Man, MD;  Location[facility] CATH LAB;  Service: Cardiovascular;  Laterality: N/A;  . LUMBAR FUSION  2006  . LUMBAR FUSION  2010  . OOPHORECTOMY  1967  . OVARIAN CYST REMOVAL    . ROTATOR CUFF REPAIR Right 2013  . SHOULDER ARTHROSCOPY  02/09/2011   Procedure: ARTHROSCOPY SHOULDER;  Surgeon: Hessie Dibble, MD;  Location: [place];  Service: Orthopedics;  Laterality: Right;  right shoulder arthroscopy subacromial decompression with rotator cuff repair  . TONSILLECTOMY      ROS:     As stated in the HPI and negative for all other systems.   PHYSICAL EXAM BP 120/60   Pulse 62   Ht 5\' 4"  (1.626 m)   Wt 127 lb (57.6 kg)   SpO2 93%   BMI 21.80 kg/m   GENERAL:  Well appearing NECK:  No jugular venous distention, waveform within normal limits, carotid upstroke brisk and symmetric, no bruits, no thyromegaly LUNGS:  Clear to auscultation bilaterally CHEST:  Unremarkable HEART:  PMI not displaced or sustained,S1 and S2 within normal limits, no S3, no S4, no clicks, no rubs, no murmurs ABD:  Flat, positive bowel sounds normal in frequency in pitch, no bruits, no rebound, no guarding, no midline pulsatile mass, no hepatomegaly, no splenomegaly EXT:  2 plus pulses throughout, no edema, no cyanosis no clubbing   EKG:  NA  ASSESSMENT AND PLAN  CARDIOMYPATHY:    She seems to be euvolemic.  No change in therapy.   HTN:      The blood pressure is at target. No change in medications is indicated. We will continue with therapeutic lifestyle changes (TLC).  PVCs:    She  has no symptoms related to this.  Continue current therapy.   TR:     This was moderate on echo last year.  I will follow this clinically.  I will repeat an echo when I see her next year.

## 2017-08-22 ENCOUNTER — Ambulatory Visit: Payer: Medicare Other | Admitting: Cardiology

## 2017-08-23 ENCOUNTER — Ambulatory Visit: Payer: Medicare Other | Admitting: Cardiology

## 2017-08-23 ENCOUNTER — Encounter: Payer: Self-pay | Admitting: Cardiology

## 2017-08-23 VITALS — BP 120/60 | HR 62 | Ht 64.0 in | Wt 127.0 lb

## 2017-08-23 DIAGNOSIS — Z13220 Encounter for screening for lipoid disorders: Secondary | ICD-10-CM

## 2017-08-23 NOTE — Patient Instructions (Signed)
Medication Instructions:  Continue current medications  If you need a refill on your cardiac medications before your next appointment, please call your pharmacy.  Labwork: Fasting Lipid HERE IN OUR OFFICE AT [facility]  Take the provided lab slips with you to the lab for your blood draw.   You will need to fast. DO NOT EAT OR DRINK PAST MIDNIGHT.   Testing/Procedures: None Ordered   Follow-Up: Your physician wants you to follow-up in: 1 Year. You should receive a reminder letter in the mail two months in advance. If you do not receive a letter, please call our office [phone].      Thank you for choosing [facility] at [facility]!!

## 2017-09-01 DIAGNOSIS — J3081 Allergic rhinitis due to animal (cat) (dog) hair and dander: Secondary | ICD-10-CM | POA: Diagnosis not present

## 2017-09-01 DIAGNOSIS — J3089 Other allergic rhinitis: Secondary | ICD-10-CM | POA: Diagnosis not present

## 2017-09-01 DIAGNOSIS — J301 Allergic rhinitis due to pollen: Secondary | ICD-10-CM | POA: Diagnosis not present

## 2017-09-13 ENCOUNTER — Ambulatory Visit: Payer: Medicare Other | Admitting: Cardiology

## 2017-09-15 DIAGNOSIS — J3089 Other allergic rhinitis: Secondary | ICD-10-CM | POA: Diagnosis not present

## 2017-09-15 DIAGNOSIS — J301 Allergic rhinitis due to pollen: Secondary | ICD-10-CM | POA: Diagnosis not present

## 2017-09-15 DIAGNOSIS — M7061 Trochanteric bursitis, right hip: Secondary | ICD-10-CM | POA: Diagnosis not present

## 2017-09-15 DIAGNOSIS — J3081 Allergic rhinitis due to animal (cat) (dog) hair and dander: Secondary | ICD-10-CM | POA: Diagnosis not present

## 2017-09-19 DIAGNOSIS — T781XXD Other adverse food reactions, not elsewhere classified, subsequent encounter: Secondary | ICD-10-CM | POA: Diagnosis not present

## 2017-09-19 DIAGNOSIS — J4520 Mild intermittent asthma, uncomplicated: Secondary | ICD-10-CM | POA: Diagnosis not present

## 2017-09-19 DIAGNOSIS — J0100 Acute maxillary sinusitis, unspecified: Secondary | ICD-10-CM | POA: Diagnosis not present

## 2017-09-19 DIAGNOSIS — J3089 Other allergic rhinitis: Secondary | ICD-10-CM | POA: Diagnosis not present

## 2017-09-22 DIAGNOSIS — M7061 Trochanteric bursitis, right hip: Secondary | ICD-10-CM | POA: Diagnosis not present

## 2017-09-26 ENCOUNTER — Other Ambulatory Visit: Payer: Self-pay | Admitting: Oncology

## 2017-09-26 DIAGNOSIS — Z1231 Encounter for screening mammogram for malignant neoplasm of breast: Secondary | ICD-10-CM

## 2017-10-07 ENCOUNTER — Other Ambulatory Visit: Payer: Medicare Other

## 2017-10-07 DIAGNOSIS — J3081 Allergic rhinitis due to animal (cat) (dog) hair and dander: Secondary | ICD-10-CM | POA: Diagnosis not present

## 2017-10-07 DIAGNOSIS — Z13220 Encounter for screening for lipoid disorders: Secondary | ICD-10-CM

## 2017-10-07 DIAGNOSIS — J3089 Other allergic rhinitis: Secondary | ICD-10-CM | POA: Diagnosis not present

## 2017-10-07 DIAGNOSIS — J301 Allergic rhinitis due to pollen: Secondary | ICD-10-CM | POA: Diagnosis not present

## 2017-10-08 LAB — LIPID PANEL
Chol/HDL Ratio: 2.9 ratio (ref 0.0–4.4)
Cholesterol, Total: 174 mg/dL (ref 100–199)
HDL: 61 mg/dL (ref >39)
LDL Calculated: 104 mg/dL — ABNORMAL HIGH (ref 0–99)
Triglycerides: 43 mg/dL (ref 0–149)
VLDL Cholesterol Cal: 9 mg/dL (ref 5–40)

## 2017-10-21 DIAGNOSIS — J301 Allergic rhinitis due to pollen: Secondary | ICD-10-CM | POA: Diagnosis not present

## 2017-10-21 DIAGNOSIS — J3089 Other allergic rhinitis: Secondary | ICD-10-CM | POA: Diagnosis not present

## 2017-10-21 DIAGNOSIS — J3081 Allergic rhinitis due to animal (cat) (dog) hair and dander: Secondary | ICD-10-CM | POA: Diagnosis not present

## 2017-10-25 ENCOUNTER — Ambulatory Visit
Admission: RE | Admit: 2017-10-25 | Discharge: 2017-10-25 | Disposition: A | Discharge status: Home / Self Care | Payer: Medicare Other | Source: Ambulatory Visit | Attending: Oncology | Admitting: Oncology

## 2017-10-25 ENCOUNTER — Other Ambulatory Visit: Payer: Self-pay | Admitting: Oncology

## 2017-10-25 DIAGNOSIS — Z1231 Encounter for screening mammogram for malignant neoplasm of breast: Secondary | ICD-10-CM

## 2017-10-31 ENCOUNTER — Telehealth: Payer: Self-pay | Admitting: *Deleted

## 2017-10-31 ENCOUNTER — Other Ambulatory Visit: Payer: Self-pay | Admitting: *Deleted

## 2017-10-31 DIAGNOSIS — I83812 Varicose veins of left lower extremities with pain: Secondary | ICD-10-CM

## 2017-10-31 NOTE — Telephone Encounter (Signed)
Pt had a bleed from a varix in her left ankle after she took a shower. Bled for 30 min. Called to ask what she should do. Offered to inject oop, but she wants ins to cover this. Will sched her for a reflux study and a MD appt asap.

## 2017-11-01 ENCOUNTER — Telehealth: Payer: Self-pay | Admitting: Vascular Surgery

## 2017-11-01 NOTE — Telephone Encounter (Signed)
sch appt spk to pt mld ltr 11/24/17 2pm LE reflux 245pm New [facility] apt

## 2017-11-03 ENCOUNTER — Other Ambulatory Visit: Payer: Self-pay | Admitting: Family Medicine

## 2017-11-03 DIAGNOSIS — E039 Hypothyroidism, unspecified: Secondary | ICD-10-CM

## 2017-11-10 DIAGNOSIS — J3089 Other allergic rhinitis: Secondary | ICD-10-CM | POA: Diagnosis not present

## 2017-11-10 DIAGNOSIS — J3081 Allergic rhinitis due to animal (cat) (dog) hair and dander: Secondary | ICD-10-CM | POA: Diagnosis not present

## 2017-11-10 DIAGNOSIS — J301 Allergic rhinitis due to pollen: Secondary | ICD-10-CM | POA: Diagnosis not present

## 2017-11-24 ENCOUNTER — Ambulatory Visit (HOSPITAL_COMMUNITY)
Admission: RE | Admit: 2017-11-24 | Discharge: 2017-11-24 | Disposition: A | Discharge status: Home / Self Care | Payer: Medicare Other | Source: Ambulatory Visit | Attending: Vascular Surgery | Admitting: Vascular Surgery

## 2017-11-24 ENCOUNTER — Ambulatory Visit: Payer: Medicare Other | Admitting: Vascular Surgery

## 2017-11-24 ENCOUNTER — Inpatient Hospital Stay: Payer: Medicare Other | Attending: Oncology

## 2017-11-24 ENCOUNTER — Encounter: Payer: Self-pay | Admitting: Vascular Surgery

## 2017-11-24 ENCOUNTER — Other Ambulatory Visit: Payer: Self-pay

## 2017-11-24 VITALS — BP 110/67 | HR 56 | Temp 97.3°F | Resp 18 | Ht 63.0 in | Wt 127.0 lb

## 2017-11-24 DIAGNOSIS — F329 Major depressive disorder, single episode, unspecified: Secondary | ICD-10-CM | POA: Diagnosis not present

## 2017-11-24 DIAGNOSIS — D801 Nonfamilial hypogammaglobulinemia: Secondary | ICD-10-CM | POA: Insufficient documentation

## 2017-11-24 DIAGNOSIS — I2510 Atherosclerotic heart disease of native coronary artery without angina pectoris: Secondary | ICD-10-CM | POA: Insufficient documentation

## 2017-11-24 DIAGNOSIS — I428 Other cardiomyopathies: Secondary | ICD-10-CM | POA: Diagnosis not present

## 2017-11-24 DIAGNOSIS — M8580 Other specified disorders of bone density and structure, unspecified site: Secondary | ICD-10-CM | POA: Diagnosis not present

## 2017-11-24 DIAGNOSIS — M949 Disorder of cartilage, unspecified: Secondary | ICD-10-CM

## 2017-11-24 DIAGNOSIS — I83892 Varicose veins of left lower extremities with other complications: Secondary | ICD-10-CM

## 2017-11-24 DIAGNOSIS — I83812 Varicose veins of left lower extremities with pain: Secondary | ICD-10-CM | POA: Insufficient documentation

## 2017-11-24 DIAGNOSIS — E039 Hypothyroidism, unspecified: Secondary | ICD-10-CM | POA: Diagnosis not present

## 2017-11-24 DIAGNOSIS — I10 Essential (primary) hypertension: Secondary | ICD-10-CM | POA: Insufficient documentation

## 2017-11-24 DIAGNOSIS — M899 Disorder of bone, unspecified: Secondary | ICD-10-CM

## 2017-11-24 DIAGNOSIS — Z79899 Other long term (current) drug therapy: Secondary | ICD-10-CM | POA: Diagnosis not present

## 2017-11-24 DIAGNOSIS — D472 Monoclonal gammopathy: Secondary | ICD-10-CM

## 2017-11-24 LAB — COMPREHENSIVE METABOLIC PANEL
ALT: 15 U/L (ref 0–44)
AST: 19 U/L (ref 15–41)
Albumin: 3.7 g/dL (ref 3.5–5.0)
Alkaline Phosphatase: 87 U/L (ref 38–126)
Anion gap: 9 (ref 5–15)
BUN: 17 mg/dL (ref 8–23)
CO2: 28 mmol/L (ref 22–32)
Calcium: 9.4 mg/dL (ref 8.9–10.3)
Chloride: 101 mmol/L (ref 98–111)
Creatinine, Ser: 0.83 mg/dL (ref 0.44–1.00)
GFR calc Af Amer: >60 mL/min (ref >60)
GFR calc non Af Amer: >60 mL/min (ref >60)
Glucose, Bld: 73 mg/dL (ref 70–99)
Potassium: 4.7 mmol/L (ref 3.5–5.1)
Sodium: 138 mmol/L (ref 135–145)
Total Bilirubin: 0.4 mg/dL (ref 0.3–1.2)
Total Protein: 6.9 g/dL (ref 6.5–8.1)

## 2017-11-24 LAB — CBC WITH DIFFERENTIAL/PLATELET
Abs Immature Granulocytes: 0.02 10*3/uL (ref 0.00–0.07)
Basophils Absolute: 0.1 10*3/uL (ref 0.0–0.1)
Basophils Relative: 1 %
Eosinophils Absolute: 0.1 10*3/uL (ref 0.0–0.5)
Eosinophils Relative: 2 %
HCT: 39.9 % (ref 36.0–46.0)
Hemoglobin: 12.9 g/dL (ref 12.0–15.0)
Immature Granulocytes: 0 %
Lymphocytes Relative: 40 %
Lymphs Abs: 3.2 10*3/uL (ref 0.7–4.0)
MCH: 31.5 pg (ref 26.0–34.0)
MCHC: 32.3 g/dL (ref 30.0–36.0)
MCV: 97.3 fL (ref 80.0–100.0)
Monocytes Absolute: 1.2 10*3/uL — ABNORMAL HIGH (ref 0.1–1.0)
Monocytes Relative: 16 %
Neutro Abs: 3.2 10*3/uL (ref 1.7–7.7)
Neutrophils Relative %: 41 %
Platelets: 304 10*3/uL (ref 150–400)
RBC: 4.1 MIL/uL (ref 3.87–5.11)
RDW: 11.9 % (ref 11.5–15.5)
WBC: 7.9 10*3/uL (ref 4.0–10.5)
nRBC: 0 % (ref 0.0–0.2)

## 2017-11-24 NOTE — Progress Notes (Signed)
Patient name: Kimberly Merritt MRN: [id number] DOB: [birth year] Sex: female  HPI: Kimberly Merritt is a 77 y.o. female, prior medical history of laser ablation of her left greater saphenous vein by my partner Dr. Kellie Simmering in 2008.  Over the last year she has had 2 episodes of bleeding from varicosities near her left ankle.  The most recent episode was a few weeks ago.  She has also had some recurrence of varicosities in her left thigh ankle and calf.  She has not really been wearing her compression stockings.  Other medical problems include coronary artery disease and previous history of congestive failure.  Most recent echocardiogram showed reasonable ejection fraction of 50%.  Past Medical History:  Diagnosis Date  . Allergy   . CAD (coronary artery disease)    Nonobstructive. LAD 30% stenosis, small PDA of the circumflex 70% stenosis, right coronary artery 40% stenosis. Catheterization September 2015  . DEPRESSION 08/29/2006  . Diverticulosis   . GERD 08/29/2006  . HEPATIC CYST 08/02/2007  . Hypertension   . HYPOTHYROIDISM 08/29/2006  . LIVER HEMANGIOMA 03/03/2010  . LOW BACK PAIN 08/29/2006  . 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 MUSCLE SPASM 03/03/2010  . Nonischemic cardiomyopathy (HCC)    EF 30%  . OSTEOARTHRITIS 08/29/2006  . OSTEOPENIA 11/19/2008  . PARESTHESIA 03/05/2009  . PVC's (premature ventricular contractions)   . Scoliosis   . 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 ABDOMINAL HYSTERECTOMY  1981  . APPENDECTOMY    . BACK SURGERY  2010,2006   lumb fusion  . BUNIONECTOMY     both  . BUNIONECTOMY Right 1985  . CARPAL TUNNEL RELEASE     both  . CARPAL TUNNEL RELEASE Right 1992  . CARPAL TUNNEL RELEASE Left 1992  . CATARACT EXTRACTION     both  . CATARACT EXTRACTION Right 2008  . CATARACT EXTRACTION Left 2008  . HAMMER TOE SURGERY Left 1987  . HAMMER TOE SURGERY Right 1986  . LEFT AND RIGHT HEART  CATHETERIZATION WITH CORONARY ANGIOGRAM N/A 10/04/2013   Procedure: LEFT AND RIGHT HEART CATHETERIZATION WITH CORONARY ANGIOGRAM;  Surgeon: Leonie Man, MD;  Location: [facility] CATH LAB;  Service: Cardiovascular;  Laterality: N/A;  . LUMBAR FUSION  2006  . LUMBAR FUSION  2010  . OOPHORECTOMY  1967  . OVARIAN CYST REMOVAL    . ROTATOR CUFF REPAIR Right 2013  . SHOULDER ARTHROSCOPY  02/09/2011   Procedure: ARTHROSCOPY SHOULDER;  Surgeon: Hessie Dibble, MD;  Location: [facility];  Service: Orthopedics;  Laterality: Right;  right shoulder arthroscopy subacromial decompression with rotator cuff repair  . TONSILLECTOMY      Family History  Problem Relation Age of Onset  . Stroke Mother   . Cancer Father   . Emphysema Father   . Hypertension Other   . Breast cancer Neg Hx     SOCIAL HISTORY: Social History   Socioeconomic History  . Marital status: Widowed    Spouse name: Not on file  . Number of children: Not on file  . Years of education: Not on file  . Highest education level: Not on file  Occupational History  . Occupation: Retired  Scientific laboratory technician  . Financial resource strain: Not on file  . Food insecurity:    Worry: Not on file    Inability: Not on file  . Transportation needs:    Medical: Not on file    Non-medical:  Not on file  Tobacco Use  . Smoking status: Never Smoker  . Smokeless tobacco: Never Used  Substance and Sexual Activity  . Alcohol use: No    Alcohol/week: 0.0 standard drinks  . Drug use: No  . Sexual activity: Not Currently  Lifestyle  . Physical activity:    Days per week: Not on file    Minutes per session: Not on file  . Stress: Not on file  Relationships  . Social connections:    Talks on phone: Not on file    Gets together: Not on file    Attends religious service: Not on file    Active member of club or organization: Not on file    Attends meetings of clubs or organizations: Not on file    Relationship status: Not on file  .  Intimate partner violence:    Fear of current or ex partner: Not on file    Emotionally abused: Not on file    Physically abused: Not on file    Forced sexual activity: Not on file  Other Topics Concern  . Not on file  Social History Narrative   Lives alone.      Allergies  Allergen Reactions  . Acetaminophen     REACTION: high dose - tremor  . Alendronate Sodium     Stomach upset.   . Contrast Media [Iodinated Diagnostic Agents]     Syncope.   . Diflunisal     Stomach pain.    . Doxycycline     n/v  . Flexeril [Cyclobenzaprine]     Abdominal pain.    . Nabumetone     Abdominal pain; dark, tarry stool.    . Oxybutynin     Pt does not remember.    . Raloxifene     Unknown.    . Sulfa Antibiotics Nausea Only    Syncope.    . Symbicort [Budesonide-Formoterol Fumarate]     "Jittery-ness."   . Tramadol Hcl     Numbness.    . Diclofenac Palpitations    Dark, tarry stool.    . Latex Rash    Current Outpatient Medications  Medication Sig Dispense Refill  . acyclovir (ZOVIRAX) 400 MG tablet TAKE 1 TABLET (400 MG TOTAL) BY MOUTH 2 TIMES DAILY. 60 tablet 4  . carvedilol (COREG) 12.5 MG tablet Take 1 tablet (12.5 mg total) by mouth 2 (two) times daily. 180 tablet 3  . Cholecalciferol (VITAMIN D3) 1000 UNITS CAPS Take 2 capsules by mouth daily.     . cromolyn (NASALCROM) 5.2 MG/ACT nasal spray Place 1 spray into both nostrils 4 (four) times daily.    Marland Kitchen EPINEPHrine 0.3 mg/0.3 mL IJ SOAJ injection   1  . fexofenadine (ALLEGRA) 180 MG tablet Take 180 mg by mouth daily.    Marland Kitchen glucosamine-chondroitin 500-400 MG tablet Take 1 tablet by mouth 2 (two) times daily.     Marland Kitchen losartan (COZAAR) 25 MG tablet Take 1 tablet (25 mg total) by mouth daily. 90 tablet 1  . NON FORMULARY Allergy injections     . Polyethyl Glycol-Propyl Glycol (SYSTANE ULTRA) 0.4-0.3 % SOLN Apply to eye. 2 drops in each eye 3 to 4 times daily    . ranitidine (ZANTAC) 150 MG tablet Take 150 mg by mouth 2 (two) times  daily.    Marland Kitchen SALINE NASAL SPRAY NA Place into the nose. 2 sprays each nostril every 4 hours    . sucralfate (CARAFATE) 1 g tablet Take 1 g  by mouth daily.    Marland Kitchen SYNTHROID 25 MCG tablet TAKE 1 TABLET BY MOUTH DAILY BEFORE BREAKFAST. 90 tablet 1   No current facility-administered medications for this visit.     ROS:   General:  No weight loss, Fever, chills  HEENT: No recent headaches, no nasal bleeding, no visual changes, no sore throat  Neurologic: No dizziness, blackouts, seizures. No recent symptoms of stroke or mini- stroke. No recent episodes of slurred speech, or temporary blindness.  Cardiac: No recent episodes of chest pain/pressure, no shortness of breath at rest.  No shortness of breath with exertion.  Denies history of atrial fibrillation or irregular heartbeat  Vascular: No history of rest pain in feet.  No history of claudication.  No history of non-healing ulcer, No history of DVT   Pulmonary: No home oxygen, no productive cough, no hemoptysis,  No asthma or wheezing  Musculoskeletal:  [X]  Arthritis, [ ]  Low back pain,  [X]  Joint pain  Hematologic:No history of hypercoagulable state.  No history of easy bleeding.  No history of anemia  Gastrointestinal: No hematochezia or melena,  No gastroesophageal reflux, no trouble swallowing  Urinary: [ ]  chronic Kidney disease, [ ]  on HD - [ ]  MWF or [ ]  TTHS, [ ]  Burning with urination, [ ]  Frequent urination, [ ]  Difficulty urinating;   Skin: No rashes  Psychological: No history of anxiety,  No history of depression   Physical Examination  Vitals:   11/24/17 1501  BP: 110/67  Pulse: (!) 56  Resp: 18  Temp: (!) 97.3 F (36.3 C)  TempSrc: Oral  SpO2: 93%  Weight: 127 lb (57.6 kg)  Height: 5\' 3"  (1.6 m)    Body mass index is 22.5 kg/m.  General:  Alert and oriented, no acute distress HEENT: Normal Skin: No rash, bilateral hemosiderin staining circumferentially from mid calf down onto the foot no open ulcer,  multiple varicosities left medial thigh 3 to 4 mm diameter. Extremity Pulses:  2+ radial, brachial, femoral, dorsalis pedis pulses bilaterally Musculoskeletal: No deformity trace pretibial edema bilaterally  Neurologic: Upper and lower extremity motor 5/5 and symmetric  DATA:  Patient had a venous reflux exam today.  This showed reflux in the greater saphenous at the proximal thigh with a 4 mm vein also at the junction.  There was no evidence of DVT.  I repeated portions of the ultrasound with the SonoSite at the bedside.  There is a segment of greater saphenous below the knee but then there are skip segments and no continuous saphenous vein all the way up to the level of the saphenofemoral junction.  ASSESSMENT: Patient with recurrent varicosities now with bleeding episodes in the left ankle area.  Although she has segments of her saphenous which have reopened it is not in continuity and I do not believe she would be a candidate for laser ablation of this.  However, I think she would benefit from sclerotherapy to prevent recurrent bleeding episodes.  She also may benefit from stab avulsions of several varicosities left medial thigh area.   PLAN: #1 patient was placed back and lower extremity long leg compression stockings today.  She will wear these daily for the next 3 months and have further evaluation.  We will see if we can get insurance approval for sclerotherapy to reduce bleeding episodes and possibly stab avulsions.   Ruta Hinds, MD [name] Office: [phone] Pager: [phone]

## 2017-11-28 ENCOUNTER — Other Ambulatory Visit: Payer: Self-pay | Admitting: Oncology

## 2017-11-28 LAB — MULTIPLE MYELOMA PANEL, SERUM
Albumin SerPl Elph-Mcnc: 3.6 g/dL (ref 2.9–4.4)
Albumin/Glob SerPl: 1.3 (ref 0.7–1.7)
Alpha 1: 0.2 g/dL (ref 0.0–0.4)
Alpha2 Glob SerPl Elph-Mcnc: 0.6 g/dL (ref 0.4–1.0)
B-Globulin SerPl Elph-Mcnc: 1.7 g/dL — ABNORMAL HIGH (ref 0.7–1.3)
Gamma Glob SerPl Elph-Mcnc: 0.3 g/dL — ABNORMAL LOW (ref 0.4–1.8)
Globulin, Total: 2.8 g/dL (ref 2.2–3.9)
IgA: 15 mg/dL — ABNORMAL LOW (ref 64–422)
IgG (Immunoglobin G), Serum: 372 mg/dL — ABNORMAL LOW (ref 700–1600)
IgM (Immunoglobulin M), Srm: 1432 mg/dL — ABNORMAL HIGH (ref 26–217)
M Protein SerPl Elph-Mcnc: 0.9 g/dL — ABNORMAL HIGH
Total Protein ELP: 6.4 g/dL (ref 6.0–8.5)

## 2017-11-28 NOTE — Progress Notes (Unsigned)
[place]  [phone] [phone]     ID: Kimberly Merritt DOB: 10/05/1940  MR#: [id number]  ZJI#:[id number]  Patient Care Team: Mellody Dance, DO as PCP - General (Family Medicine) Melrose Nakayama, MD as Attending Physician (Orthopedic Surgery) Magrinat, Virgie Dad, MD (Hematology and Oncology) Druscilla Brownie, MD as Consulting Physician (Dermatology) Clarene Essex, MD (Gastroenterology) Rutherford Guys, MD as Attending Physician (Ophthalmology) Terrance Mass, MD (Obstetrics and Gynecology) Mal Misty, MD (Inactive) as Attending Physician (Vascular Surgery) Jovita Gamma, MD as Consulting Physician (Neurosurgery) Minus Breeding, MD as Consulting Physician (Cardiology) Levy Sjogren, MD as Referring Physician (Dermatology) Irine Seal, MD as Attending Physician (Urology) Rutherford Guys, MD as Consulting Physician (Ophthalmology) Mosetta Anis, MD as Referring Physician (Allergy) Hurley Cisco, MD as Consulting Physician (Rheumatology) OTHER MD: Minus Breeding MD, Marshell Garfinkel MD  CHIEF COMPLAINT: M-GUS/ low-grade lymphoproliferative process  CURRENT TREATMENT: IVIG as needed   HISTORY PRESENT ILLNESS::  from the earlier summary note:  "Kimberly Merritt" Blunt is a 77 y/o [place] woman I evaluated originally in October of 2004 for a monoclonal gammopathy. At that time she had a white cell count of 7.2, hemoglobin 12.8, MCV 94.5, and platelets 298,000. Protein workup showed an IgM kappa paraprotein of 0.6 g, with a total IgG slightly depressed at 556, but a normal IgA at 82. Because an IgM paraprotein can be associated with a low grade lymphoproliferative process (Waldenstrm's macroglobulinemia) Kimberly Merritt underwent bone marrow biopsy 11/27/2002, which showed ([id number]) a normal cellular marrow with trilineage hematopoiesis and 5% plasma cells. The peripheral blood film showed no significant abnormalities and on the biopsy  para-spicular lymphoid aggregates were not noted. 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  The patient was followed with observation alone through May of 2009, with no evidence of progression in her IgM kappa clone. As of 06/03/2007 her total IgM spike was 0.83, her IgG was 490 and her IgA was 36. The patient was released from followup here at that time.  More recently, as the 10 year anniversary of her original bone marrow biopsy past, Dr. Dorann Lodge of referred the patient back for reassessment and possible bone marrow rebiopsy. Kimberly Merritt was scheduled here for lab work 09/25/2013. However when she had her blood drawn she complained of some shortness of breath and her pulse was checked. It was 33. The patient was referred to the emergency room where she was evaluated by Dr. Percival Spanish who felt the patient's true heart rate was higher (she has ectopic beats which generated a week pulse and may have been missed) since in the emergency room EKG her rate was 98 with normal sinus rhythm. Troponins were negative. BNP was slightly elevated.  Dr. Percival Spanish set the patient up for an echocardiogram 09/27/2013 and this showed an ejection fraction of 30-35%. There was diffuse hypokinesis. She underwent right and left heart catheterization 10/04/2013 which showed no significant obstructions. The study did confirm an ejection fraction of roughly 35% with global hypokinesis. She is followed by Dr. Percival Spanish for her new diagnosis of non-ischemic cardiomyopathy.   Her subsequent history is as detailed below  Kimberly Merritt returns today for 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 She is well overall, but notes that some days are good and some are quite not as good. She notes that today is a "not so good" day due to the weather. She has moderate generalized aching due to the weather today. She uses Bengay which alleviates her  aches.   She had a routine  bilateral mammogram with CAD completed at the breast center on 10/22/16 with no mammographic evidence of malignancy. Breast density category D.  REVIEW OF SYSTEMS:  Kimberly Merritt reports that she gardens as her form of exercise. She lives in [place], [place] and it is approximately 13-15 miles to the [employer] She reports that she has an persistent cold that comes and goes, but never fully resolves. She notes a fever last week, but she was unable to take a temperature due to her thermometer being broken. She has increased fatigue with activity. She has mild weight loss. She denies unusual headaches, visual changes, nausea, vomiting, or dizziness. There has been no unusual cough, phlegm production, or pleurisy. This been no change in bowel or bladder habits. She denies bleeding, rash, or fever. A detailed review of systems was otherwise stable.    PAST MEDICAL HISTORY: Past Medical History:  Diagnosis Date  . Allergy   . CAD (coronary artery disease)    Nonobstructive. LAD 30% stenosis, small PDA of the circumflex 70% stenosis, right coronary artery 40% stenosis. Catheterization September 2015  . DEPRESSION 08/29/2006  . Diverticulosis   . GERD 08/29/2006  . HEPATIC CYST 08/02/2007  . Hypertension   . HYPOTHYROIDISM 08/29/2006  . LIVER HEMANGIOMA 03/03/2010  . LOW BACK PAIN 08/29/2006  . 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 MUSCLE SPASM 03/03/2010  . Nonischemic cardiomyopathy (HCC)    EF 30%  . OSTEOARTHRITIS 08/29/2006  . OSTEOPENIA 11/19/2008  . PARESTHESIA 03/05/2009  . PVC's (premature ventricular contractions)   . Scoliosis   . VARICOSE VEINS, LOWER EXTREMITIES 08/29/2006    PAST SURGICAL HISTORY: Past Surgical History:  Procedure Laterality Date  . ABDOMINAL HYSTERECTOMY  1981  . APPENDECTOMY    . BACK SURGERY  2010,2006   lumb fusion  . BUNIONECTOMY     both  . BUNIONECTOMY Right 1985  . CARPAL TUNNEL RELEASE     both  . CARPAL TUNNEL RELEASE Right 1992  . CARPAL TUNNEL RELEASE Left  1992  . CATARACT EXTRACTION     both  . CATARACT EXTRACTION Right 2008  . CATARACT EXTRACTION Left 2008  . HAMMER TOE SURGERY Left 1987  . HAMMER TOE SURGERY Right 1986  . LEFT AND RIGHT HEART CATHETERIZATION WITH CORONARY ANGIOGRAM N/A 10/04/2013   Procedure: LEFT AND RIGHT HEART CATHETERIZATION WITH CORONARY ANGIOGRAM;  Surgeon: Leonie Man, MD;  Location: [facility];  Service: Cardiovascular;  Laterality: N/A;  . LUMBAR FUSION  2006  . LUMBAR FUSION  2010  . OOPHORECTOMY  1967  . OVARIAN CYST REMOVAL    . ROTATOR CUFF REPAIR Right 2013  . SHOULDER ARTHROSCOPY  02/09/2011   Procedure: ARTHROSCOPY SHOULDER;  Surgeon: Hessie Dibble, MD;  Location: [facility];  Service: Orthopedics;  Laterality: Right;  right shoulder arthroscopy subacromial decompression with rotator cuff repair  . TONSILLECTOMY      FAMILY HISTORY Family History  Problem Relation Age of Onset  . Stroke Mother   . Cancer Father   . Emphysema Father   . Hypertension Other   . Breast cancer Neg Hx    the patient's father died at the age of 31 from lung cancer in the setting of tobacco abuse. [name] mother died at the age of 66 following a stroke. The patient had one brother, who is severely retarded and died from pneumonia at the age of 76. There were no sisters. There is no history of  blood problems or cancer in the family to the patient's knowledge  GYNECOLOGIC HISTORY:  No LMP recorded. Patient is postmenopausal. Menarche age 30. The patient is GX P0. She stopped having periods in 04/03/78 and took hormone replacement for almost 20 years.  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 Her husband died in 04-04-91 from a myocardial infarction. She lives by herself, with one cat for company.    ADVANCED DIRECTIVES: Not in place. If she became disabled she would want [place] to contact her neighbor and third cousin Andrey Cota at [place]:  Social History   Tobacco Use  . Smoking status: Never Smoker  . Smokeless tobacco: Never Used  Substance Use Topics  . Alcohol use: No    Alcohol/week: 0.0 standard drinks  . Drug use: No     Colonoscopy: 2012-04-03  PAP: 04/04/2011  Bone density: 2010-04-03  Lipid panel:  Allergies  Allergen Reactions  . Acetaminophen     REACTION: high dose - tremor  . Alendronate Sodium     Stomach upset.   . Contrast Media [Iodinated Diagnostic Agents]     Syncope.   . Diflunisal     Stomach pain.    . Doxycycline     n/v  . Flexeril [Cyclobenzaprine]     Abdominal pain.    . Nabumetone     Abdominal pain; dark, tarry stool.    . Oxybutynin     Pt does not remember.    . Raloxifene     Unknown.    . Sulfa Antibiotics Nausea Only    Syncope.    . Symbicort [Budesonide-Formoterol Fumarate]     "Jittery-ness."   . Tramadol Hcl     Numbness.    . Diclofenac Palpitations    Dark, tarry stool.    . Latex Rash    Current Outpatient Medications  Medication Sig Dispense Refill  . acyclovir (ZOVIRAX) 400 MG tablet TAKE 1 TABLET (400 MG TOTAL) BY MOUTH 2 TIMES DAILY. 60 tablet 4  . carvedilol (COREG) 12.5 MG tablet Take 1 tablet (12.5 mg total) by mouth 2 (two) times daily. 180 tablet 3  . Cholecalciferol (VITAMIN D3) 1000 UNITS CAPS Take 2 capsules by mouth daily.     . cromolyn (NASALCROM) 5.2 MG/ACT nasal spray Place 1 spray into both nostrils 4 (four) times daily.    Marland Kitchen EPINEPHrine 0.3 mg/0.3 mL IJ SOAJ injection   1  . fexofenadine (ALLEGRA) 180 MG tablet Take 180 mg by mouth daily.    Marland Kitchen glucosamine-chondroitin 500-400 MG tablet Take 1 tablet by mouth 2 (two) times daily.     Marland Kitchen losartan (COZAAR) 25 MG tablet Take 1 tablet (25 mg total) by mouth daily. 90 tablet 1  . NON FORMULARY Allergy injections     . Polyethyl Glycol-Propyl Glycol (SYSTANE ULTRA) 0.4-0.3 % SOLN Apply to eye. 2 drops in each eye 3 to 4 times daily    . ranitidine (ZANTAC) 150 MG tablet Take 150 mg by mouth 2  (two) times daily.    Marland Kitchen SALINE NASAL SPRAY NA Place into the nose. 2 sprays each nostril every 4 hours    . sucralfate (CARAFATE) 1 g tablet Take 1 g by mouth daily.    Marland Kitchen SYNTHROID 25 MCG tablet TAKE 1 TABLET BY MOUTH DAILY BEFORE BREAKFAST. 90 tablet 1   No current facility-administered medications for this visit.     OBJECTIVE: Older white  woman in no acute distress  There were no vitals filed for this visit.   There is no height or weight on file to calculate BMI.    ECOG FS:2 - Symptomatic, <50% confined to bed  Sclerae unicteric, EOMs intact Oropharynx clear and moist No cervical or supraclavicular adenopathy Lungs no rales or rhonchi Heart regular rate and rhythm Abd soft, nontender, positive bowel sounds MSK no focal spinal tenderness, no upper extremity lymphedema Neuro: nonfocal, well oriented, appropriate affect Breasts: No masses detected in either breast.  Both axillae are benign    LAB RESULTS: Her IgM has risen some, from 1151 last year to 131 for now.  Her IgG remains quite low at 374.  The M spike remains at less than a gram, 0.8 currently.  This is an IgM kappa paraprotein  CMP     Component Value Date/Time   NA 138 11/24/2017 1045   NA 136 11/04/2016 1115   K 4.7 11/24/2017 1045   K 4.8 11/04/2016 1115   CL 101 11/24/2017 1045   CO2 28 11/24/2017 1045   CO2 27 11/04/2016 1115   GLUCOSE 73 11/24/2017 1045   GLUCOSE 95 11/04/2016 1115   BUN 17 11/24/2017 1045   BUN 16.7 11/04/2016 1115   CREATININE 0.83 11/24/2017 1045   CREATININE 0.8 11/04/2016 1115   CALCIUM 9.4 11/24/2017 1045   CALCIUM 9.0 11/04/2016 1115   PROT 6.9 11/24/2017 1045   PROT 6.6 11/04/2016 1115   PROT 6.3 11/04/2016 1115   ALBUMIN 3.7 11/24/2017 1045   ALBUMIN 3.5 11/04/2016 1115   AST 19 11/24/2017 1045   AST 23 11/04/2016 1115   ALT 15 11/24/2017 1045   ALT 18 11/04/2016 1115   ALKPHOS 87 11/24/2017 1045   ALKPHOS 82 11/04/2016 1115   BILITOT 0.4 11/24/2017 1045   BILITOT  0.40 11/04/2016 1115   GFRNONAA >60 11/24/2017 1045   GFRNONAA 69 10/02/2013 1234   GFRAA >60 11/24/2017 1045   GFRAA 80 10/02/2013 1234    I No results found for: SPEP  Lab Results  Component Value Date   WBC 7.9 11/24/2017   NEUTROABS 3.2 11/24/2017   HGB 12.9 11/24/2017   HCT 39.9 11/24/2017   MCV 97.3 11/24/2017   PLT 304 11/24/2017      Chemistry      Component Value Date/Time   NA 138 11/24/2017 1045   NA 136 11/04/2016 1115   K 4.7 11/24/2017 1045   K 4.8 11/04/2016 1115   CL 101 11/24/2017 1045   CO2 28 11/24/2017 1045   CO2 27 11/04/2016 1115   BUN 17 11/24/2017 1045   BUN 16.7 11/04/2016 1115   CREATININE 0.83 11/24/2017 1045   CREATININE 0.8 11/04/2016 1115   GLU 89 04/08/2016      Component Value Date/Time   CALCIUM 9.4 11/24/2017 1045   CALCIUM 9.0 11/04/2016 1115   ALKPHOS 87 11/24/2017 1045   ALKPHOS 82 11/04/2016 1115   AST 19 11/24/2017 1045   AST 23 11/04/2016 1115   ALT 15 11/24/2017 1045   ALT 18 11/04/2016 1115   BILITOT 0.4 11/24/2017 1045   BILITOT 0.40 11/04/2016 1115       No results found for: LABCA2  No components found for: LABCA125  No results for input(s): INR in the last 168 hours.  Urinalysis    Component Value Date/Time   COLORURINE LT. YELLOW 06/08/2011 0830   APPEARANCEUR CLEAR 06/08/2011 0830   LABSPEC 1.010 06/08/2011 0830   PHURINE 6.5  06/08/2011 0830   GLUCOSEU NEGATIVE 06/08/2011 0830   HGBUR NEGATIVE 06/08/2011 0830   BILIRUBINUR NEGATIVE 06/08/2011 0830   KETONESUR NEGATIVE 06/08/2011 0830   PROTEINUR NEGATIVE 04/19/2008 1216   UROBILINOGEN 0.2 06/08/2011 0830   NITRITE NEGATIVE 06/08/2011 0830   LEUKOCYTESUR NEGATIVE 06/08/2011 0830    STUDIES: Lab work discussed in detail with the patient  Bilateral screening mammography October 22, 2016 showed the breast density to be category D.  There was no malignancy noted  ASSESSMENT: 77 y.o. 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 When it causes amyloidosis of the heart, which is quite rare, the pattern is restrictive. 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 In case of an infection, IVIG supplementation should be considered  (a) received IVIG October 2016  (b) Pete IVIG October 2018  PLAN:  Kimberly Merritt is generally stable and does not show clear progression to multiple myeloma or to a more aggressive myeloproliferative problem.  On the other hand she still has a very low IVIG level and she has been fighting a "cold" now for about 2 months, despite a course of antibiotics.  I think she would benefit from IVIG supplementation, which hopefully will carry her through the winter.  She understands the possible toxicities, side effects and complications of this, which she did not experience with her first dose 2 years ago.  This has been scheduled for December 07, 2016.  I am going to start following her labs a little bit more closely, every few months, although she still will see me only on a once a year basis unless some other problem develops between visits.  She knows to call for any other problems that may develop between then.  Magrinat, Virgie Dad, MD  11/28/17 3:56 PM Medical Oncology and Hematology [facility] [address] [place], [place] [address] Tel. [phone]    Fax. [phone]  This document serves as a record of services personally performed by Lurline Del, MD. It was created on her behalf by Steva Colder, a trained medical scribe. The creation of this record is based on the scribe's personal observations and the provider's statements to them. This document has been checked and approved by the attending provider.

## 2017-12-01 ENCOUNTER — Other Ambulatory Visit: Payer: Self-pay | Admitting: Oncology

## 2017-12-01 ENCOUNTER — Telehealth: Payer: Self-pay | Admitting: Oncology

## 2017-12-01 ENCOUNTER — Inpatient Hospital Stay: Payer: Medicare Other | Admitting: Oncology

## 2017-12-01 VITALS — BP 131/65 | HR 65 | Temp 98.4°F | Resp 18 | Ht 63.0 in | Wt 128.4 lb

## 2017-12-01 DIAGNOSIS — D472 Monoclonal gammopathy: Secondary | ICD-10-CM

## 2017-12-01 DIAGNOSIS — I10 Essential (primary) hypertension: Secondary | ICD-10-CM | POA: Diagnosis not present

## 2017-12-01 DIAGNOSIS — I2510 Atherosclerotic heart disease of native coronary artery without angina pectoris: Secondary | ICD-10-CM | POA: Diagnosis not present

## 2017-12-01 DIAGNOSIS — J3081 Allergic rhinitis due to animal (cat) (dog) hair and dander: Secondary | ICD-10-CM | POA: Diagnosis not present

## 2017-12-01 DIAGNOSIS — M8580 Other specified disorders of bone density and structure, unspecified site: Secondary | ICD-10-CM | POA: Diagnosis not present

## 2017-12-01 DIAGNOSIS — D801 Nonfamilial hypogammaglobulinemia: Secondary | ICD-10-CM | POA: Diagnosis not present

## 2017-12-01 DIAGNOSIS — F329 Major depressive disorder, single episode, unspecified: Secondary | ICD-10-CM

## 2017-12-01 DIAGNOSIS — M8589 Other specified disorders of bone density and structure, multiple sites: Secondary | ICD-10-CM

## 2017-12-01 DIAGNOSIS — I428 Other cardiomyopathies: Secondary | ICD-10-CM | POA: Diagnosis not present

## 2017-12-01 DIAGNOSIS — E039 Hypothyroidism, unspecified: Secondary | ICD-10-CM | POA: Diagnosis not present

## 2017-12-01 DIAGNOSIS — J301 Allergic rhinitis due to pollen: Secondary | ICD-10-CM | POA: Diagnosis not present

## 2017-12-01 DIAGNOSIS — Z79899 Other long term (current) drug therapy: Secondary | ICD-10-CM | POA: Diagnosis not present

## 2017-12-01 DIAGNOSIS — D849 Immunodeficiency, unspecified: Secondary | ICD-10-CM

## 2017-12-01 NOTE — Telephone Encounter (Signed)
Gave patient avs and calendar.   °

## 2017-12-01 NOTE — Progress Notes (Signed)
[place]  [phone] [phone]     ID: [name] DOB: 1940-06-04  MR#: [id number]  AUQ#:[id number]  Patient Care Team: Mellody Dance, DO as PCP - General (Family Medicine) Melrose Nakayama, MD as Attending Physician (Orthopedic Surgery) Dejon Jungman, Virgie Dad, MD (Hematology and Oncology) Druscilla Brownie, MD as Consulting Physician (Dermatology) Clarene Essex, MD (Gastroenterology) Rutherford Guys, MD as Attending Physician (Ophthalmology) Terrance Mass, MD (Obstetrics and Gynecology) Mal Misty, MD (Inactive) as Attending Physician (Vascular Surgery) Jovita Gamma, MD as Consulting Physician (Neurosurgery) Minus Breeding, MD as Consulting Physician (Cardiology) Levy Sjogren, MD as Referring Physician (Dermatology) Irine Seal, MD as Attending Physician (Urology) Rutherford Guys, MD as Consulting Physician (Ophthalmology) Mosetta Anis, MD as Referring Physician (Allergy) Hurley Cisco, MD as Consulting Physician (Rheumatology) OTHER MD: Minus Breeding MD, Marshell Garfinkel MD  CHIEF COMPLAINT: M-GUS/ low-grade lymphoproliferative process  CURRENT TREATMENT: IVIG as needed   HISTORY PRESENT ILLNESS::  from the earlier summary note:  "Kimberly Merritt" Blunt is a 77 y/o [place] woman I evaluated originally in October of 2004 for a monoclonal gammopathy. At that time she had a white cell count of 7.2, hemoglobin 12.8, MCV 94.5, and platelets 298,000. Protein workup showed an IgM kappa paraprotein of 0.6 g, with a total IgG slightly depressed at 556, but a normal IgA at 82. Because an IgM paraprotein can be associated with a low grade lymphoproliferative process (Waldenstrm's macroglobulinemia) [name] underwent bone marrow biopsy 11/27/2002, which showed ([id number]) a normal cellular marrow with trilineage hematopoiesis and 5% plasma cells. The peripheral blood film showed no significant abnormalities and on the biopsy  para-spicular lymphoid aggregates were not noted. 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  The patient was followed with observation alone through May of 2009, with no evidence of progression in her IgM kappa clone. As of 06/03/2007 her total IgM spike was 0.83, her IgG was 490 and her IgA was 36. The patient was released from followup here at that time.  More recently, as the 10 year anniversary of her original bone marrow biopsy past, Dr. Dorann Lodge of referred the patient back for reassessment and possible bone marrow rebiopsy. Kimberly Merritt was scheduled here for lab work 09/25/2013. However when she had her blood drawn she complained of some shortness of breath and her pulse was checked. It was 33. The patient was referred to the emergency room where she was evaluated by Dr. Percival Spanish who felt the patient's true heart rate was higher (she has ectopic beats which generated a week pulse and may have been missed) since in the emergency room EKG her rate was 98 with normal sinus rhythm. Troponins were negative. BNP was slightly elevated.  Dr. Percival Spanish set the patient up for an echocardiogram 09/27/2013 and this showed an ejection fraction of 30-35%. There was diffuse hypokinesis. She underwent right and left heart catheterization 10/04/2013 which showed no significant obstructions. The study did confirm an ejection fraction of roughly 35% with global hypokinesis. She is followed by Dr. Percival Spanish for her new diagnosis of non-ischemic cardiomyopathy.  Her subsequent history is as detailed below  [place] returns today for 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 She is doing well overall, however she is developing some signs of recurrent sinusitis.  She was actually treated with antibiotics in August for the same problem and has not quite been able to shake it.  The reason for this of course is going to be her immunodeficiency.  She  denies high fevers, drenching sweats, unexplained weight loss, or unexplained fatigue.  She is not aware of any adenopathy..  Since her last visit to the office, she had a routine screening mammogram on 10/25/2017 that showed: Breast density category C. No mammographic evidence of malignancy.    REVIEW OF SYSTEMS: Kimberly Merritt reports that for exercise, she cleans [place] branches [place] due to storms. Today she report being stuffy and has had nose drainage in the back of her throat. She has gotten her flu shot this season, but is being cautious around others. She denies unusual headaches, visual changes, nausea, vomiting, or dizziness. There has been no unusual cough, phlegm production, or pleurisy. This been no change in bowel or bladder habits. She denies unexplained fatigue or unexplained weight loss, bleeding, rash, or fever. A detailed review of systems was otherwise stable.    PAST MEDICAL HISTORY: Past Medical History:  Diagnosis Date  . Allergy   . CAD (coronary artery disease)    Nonobstructive. LAD 30% stenosis, small PDA of the circumflex 70% stenosis, right coronary artery 40% stenosis. Catheterization September 2015  . DEPRESSION 08/29/2006  . Diverticulosis   . GERD 08/29/2006  . HEPATIC CYST 08/02/2007  . Hypertension   . HYPOTHYROIDISM 08/29/2006  . LIVER HEMANGIOMA 03/03/2010  . LOW BACK PAIN 08/29/2006  . 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 MUSCLE SPASM 03/03/2010  . Nonischemic cardiomyopathy (HCC)    EF 30%  . OSTEOARTHRITIS 08/29/2006  . OSTEOPENIA 11/19/2008  . PARESTHESIA 03/05/2009  . PVC's (premature ventricular contractions)   . Scoliosis   . VARICOSE VEINS, LOWER EXTREMITIES 08/29/2006    PAST SURGICAL HISTORY: Past Surgical History:  Procedure Laterality Date  . ABDOMINAL HYSTERECTOMY  1981  . APPENDECTOMY    . BACK SURGERY  2010,2006   lumb fusion  . BUNIONECTOMY     both  . BUNIONECTOMY Right 1985  . CARPAL TUNNEL RELEASE     both  . CARPAL  TUNNEL RELEASE Right 1992  . CARPAL TUNNEL RELEASE Left 1992  . CATARACT EXTRACTION     both  . CATARACT EXTRACTION Right 2008  . CATARACT EXTRACTION Left 2008  . HAMMER TOE SURGERY Left 1987  . HAMMER TOE SURGERY Right 1986  . LEFT AND RIGHT HEART CATHETERIZATION WITH CORONARY ANGIOGRAM N/A 10/04/2013   Procedure: LEFT AND RIGHT HEART CATHETERIZATION WITH CORONARY ANGIOGRAM;  Surgeon: Leonie Man, MD;  Location: [facility] CATH LAB;  Service: Cardiovascular;  Laterality: N/A;  . LUMBAR FUSION  2006  . LUMBAR FUSION  2010  . OOPHORECTOMY  1967  . OVARIAN CYST REMOVAL    . ROTATOR CUFF REPAIR Right 2013  . SHOULDER ARTHROSCOPY  02/09/2011   Procedure: ARTHROSCOPY SHOULDER;  Surgeon: Hessie Dibble, MD;  Location: [facility];  Service: Orthopedics;  Laterality: Right;  right shoulder arthroscopy subacromial decompression with rotator cuff repair  . TONSILLECTOMY      FAMILY HISTORY Family History  Problem Relation Age of Onset  . Stroke Mother   . Cancer Father   . Emphysema Father   . Hypertension Other   . Breast cancer Neg Hx    the patient's father died at the age of 65 from lung cancer in the setting of tobacco abuse. [name] mother died at the age of 40 following a stroke. The patient had one brother, who is severely retarded and died from pneumonia at the age of 26. There were no sisters. There is no history  of blood problems or cancer in the family to the patient's knowledge  GYNECOLOGIC HISTORY:  No LMP recorded. Patient is postmenopausal. Menarche age 70. The patient is GX P0. She stopped having periods in 04-30-1978 and took hormone replacement for almost 20 years.  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 Her husband died in 30-Apr-1991 from a myocardial infarction.  She tells me she has no family.  She lives by herself, in [place] acres, with one cat for company.    ADVANCED DIRECTIVES: Not in place. If she became disabled she  would want [place] to contact her neighbor and third cousin Andrey Cota at [place]:  Social History   Tobacco Use  . Smoking status: Never Smoker  . Smokeless tobacco: Never Used  Substance Use Topics  . Alcohol use: No    Alcohol/week: 0.0 standard drinks  . Drug use: No     Colonoscopy: 2012/04/29  PAP: 2011-04-30  Bone density: 30-Apr-2010  Lipid panel:  Allergies  Allergen Reactions  . Acetaminophen     REACTION: high dose - tremor  . Alendronate Sodium     Stomach upset.   . Contrast Media [Iodinated Diagnostic Agents]     Syncope.   . Diflunisal     Stomach pain.    . Doxycycline     n/v  . Flexeril [Cyclobenzaprine]     Abdominal pain.    . Nabumetone     Abdominal pain; dark, tarry stool.    . Oxybutynin     Pt does not remember.    . Raloxifene     Unknown.    . Sulfa Antibiotics Nausea Only    Syncope.    . Symbicort [Budesonide-Formoterol Fumarate]     "Jittery-ness."   . Tramadol Hcl     Numbness.    . Diclofenac Palpitations    Dark, tarry stool.    . Latex Rash    Current Outpatient Medications  Medication Sig Dispense Refill  . acyclovir (ZOVIRAX) 400 MG tablet TAKE 1 TABLET (400 MG TOTAL) BY MOUTH 2 TIMES DAILY. 60 tablet 4  . carvedilol (COREG) 12.5 MG tablet Take 1 tablet (12.5 mg total) by mouth 2 (two) times daily. 180 tablet 3  . Cholecalciferol (VITAMIN D3) 1000 UNITS CAPS Take 2 capsules by mouth daily.     . cromolyn (NASALCROM) 5.2 MG/ACT nasal spray Place 1 spray into both nostrils 4 (four) times daily.    Marland Kitchen EPINEPHrine 0.3 mg/0.3 mL IJ SOAJ injection   1  . fexofenadine (ALLEGRA) 180 MG tablet Take 180 mg by mouth daily.    Marland Kitchen glucosamine-chondroitin 500-400 MG tablet Take 1 tablet by mouth 2 (two) times daily.     Marland Kitchen losartan (COZAAR) 25 MG tablet Take 1 tablet (25 mg total) by mouth daily. 90 tablet 1  . NON FORMULARY Allergy injections     . Polyethyl Glycol-Propyl Glycol (SYSTANE ULTRA) 0.4-0.3 % SOLN Apply to eye. 2 drops in  each eye 3 to 4 times daily    . ranitidine (ZANTAC) 150 MG tablet Take 150 mg by mouth 2 (two) times daily.    Marland Kitchen SALINE NASAL SPRAY NA Place into the nose. 2 sprays each nostril every 4 hours    . sucralfate (CARAFATE) 1 g tablet Take 1 g by mouth daily.    Marland Kitchen SYNTHROID 25 MCG tablet TAKE 1 TABLET BY MOUTH DAILY BEFORE BREAKFAST. 90 tablet 1   No  current facility-administered medications for this visit.     OBJECTIVE: Older white woman who appears stated age  77:   12/01/17 1255  BP: 131/65  Pulse: 65  Resp: 18  Temp: 98.4 F (36.9 C)  SpO2: 98%     Body mass index is 22.75 kg/m.    ECOG FS:2 - Symptomatic, <50% confined to bed  Sclerae unicteric, pupils round and equal Oropharynx clear and moist No cervical or supraclavicular adenopathy no axillary or inguinal adenopathy Lungs no rales or rhonchi Heart regular rate and rhythm Abd soft, nontender, positive bowel sounds MSK no focal spinal tenderness, no upper extremity lymphedema Neuro: nonfocal, well oriented, appropriate affect Breasts: Benign    LAB RESULTS: The M protein is stable at 0.9.  However the total IgG continues to drop, now at less than 400  CMP     Component Value Date/Time   NA 138 11/24/2017 1045   NA 136 11/04/2016 1115   K 4.7 11/24/2017 1045   K 4.8 11/04/2016 1115   CL 101 11/24/2017 1045   CO2 28 11/24/2017 1045   CO2 27 11/04/2016 1115   GLUCOSE 73 11/24/2017 1045   GLUCOSE 95 11/04/2016 1115   BUN 17 11/24/2017 1045   BUN 16.7 11/04/2016 1115   CREATININE 0.83 11/24/2017 1045   CREATININE 0.8 11/04/2016 1115   CALCIUM 9.4 11/24/2017 1045   CALCIUM 9.0 11/04/2016 1115   PROT 6.9 11/24/2017 1045   PROT 6.6 11/04/2016 1115   PROT 6.3 11/04/2016 1115   ALBUMIN 3.7 11/24/2017 1045   ALBUMIN 3.5 11/04/2016 1115   AST 19 11/24/2017 1045   AST 23 11/04/2016 1115   ALT 15 11/24/2017 1045   ALT 18 11/04/2016 1115   ALKPHOS 87 11/24/2017 1045   ALKPHOS 82 11/04/2016 1115   BILITOT 0.4  11/24/2017 1045   BILITOT 0.40 11/04/2016 1115   GFRNONAA >60 11/24/2017 1045   GFRNONAA 69 10/02/2013 1234   GFRAA >60 11/24/2017 1045   GFRAA 80 10/02/2013 1234    I No results found for: SPEP  Lab Results  Component Value Date   WBC 7.9 11/24/2017   NEUTROABS 3.2 11/24/2017   HGB 12.9 11/24/2017   HCT 39.9 11/24/2017   MCV 97.3 11/24/2017   PLT 304 11/24/2017      Chemistry      Component Value Date/Time   NA 138 11/24/2017 1045   NA 136 11/04/2016 1115   K 4.7 11/24/2017 1045   K 4.8 11/04/2016 1115   CL 101 11/24/2017 1045   CO2 28 11/24/2017 1045   CO2 27 11/04/2016 1115   BUN 17 11/24/2017 1045   BUN 16.7 11/04/2016 1115   CREATININE 0.83 11/24/2017 1045   CREATININE 0.8 11/04/2016 1115   GLU 89 04/08/2016      Component Value Date/Time   CALCIUM 9.4 11/24/2017 1045   CALCIUM 9.0 11/04/2016 1115   ALKPHOS 87 11/24/2017 1045   ALKPHOS 82 11/04/2016 1115   AST 19 11/24/2017 1045   AST 23 11/04/2016 1115   ALT 15 11/24/2017 1045   ALT 18 11/04/2016 1115   BILITOT 0.4 11/24/2017 1045   BILITOT 0.40 11/04/2016 1115       No results found for: LABCA2  No components found for: LABCA125  No results for input(s): INR in the last 168 hours.  Urinalysis    Component Value Date/Time   COLORURINE LT. YELLOW 06/08/2011 0830   APPEARANCEUR CLEAR 06/08/2011 0830   LABSPEC 1.010 06/08/2011 0830  PHURINE 6.5 06/08/2011 0830   GLUCOSEU NEGATIVE 06/08/2011 0830   HGBUR NEGATIVE 06/08/2011 0830   BILIRUBINUR NEGATIVE 06/08/2011 0830   KETONESUR NEGATIVE 06/08/2011 0830   PROTEINUR NEGATIVE 04/19/2008 1216   UROBILINOGEN 0.2 06/08/2011 0830   NITRITE NEGATIVE 06/08/2011 0830   LEUKOCYTESUR NEGATIVE 06/08/2011 0830    STUDIES: Lab work discussed in detail with the patient  Bilateral screening mammography October 22, 2016 showed the breast density to be category D.  There was no malignancy noted  ASSESSMENT: 77 y.o. 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 When it causes amyloidosis of the heart, which is quite rare, the pattern is restrictive. 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 In case of an infection, IVIG supplementation should be considered  (a) received IVIG October 2016  (b) repeated IVIG October 2018  (c) to be repeated November 2019  PLAN:  Kimberly Merritt does remarkably well for her age, but she is clearly immunocompromised and cannot shake simple infections such as a sinus infection she had in August.  She is going to need some IVIG help and this is going to be scheduled for 12/08/2017.  Aside from that her IgM kappa gammopathy is very stable.  It is not causing her any cytopenias.  At this point we do not need to interfere with chemotherapy or similar agents  We will have her labs repeated in 6 months and 12 months and I will see her shortly after that.  She knows to call for any other issues that may develop before the next visit.  Anabeth Chilcott, Virgie Dad, MD  12/01/17 1:41 PM Medical Oncology and Hematology [facility] [address] [place], [place] [address] Tel. [phone]    Fax. [phone]    Elie Goody, am acting as scribe for Dr. Virgie Dad. Meena Barrantes.   I, Lurline Del MD, have reviewed the above documentation for accuracy and completeness, and I agree with the above.

## 2017-12-01 NOTE — Progress Notes (Signed)
Kimberly Merritt  Telephone:([phone]) [phone] Fax:(336) [phone]     ID: [name] DOB: 09/10/1940  MR#: [id number]  JYN#:[id number]  Patient Care Team: Mellody Dance, DO as PCP - General (Family Medicine) Melrose Nakayama, MD as Attending Physician (Orthopedic Surgery) Alianys Chacko, Virgie Dad, MD (Hematology and Oncology) Druscilla Brownie, MD as Consulting Physician (Dermatology) Clarene Essex, MD (Gastroenterology) Rutherford Guys, MD as Attending Physician (Ophthalmology) Terrance Mass, MD (Obstetrics and Gynecology) Mal Misty, MD (Inactive) as Attending Physician (Vascular Surgery) Jovita Gamma, MD as Consulting Physician (Neurosurgery) Minus Breeding, MD as Consulting Physician (Cardiology) Levy Sjogren, MD as Referring Physician (Dermatology) Irine Seal, MD as Attending Physician (Urology) Rutherford Guys, MD as Consulting Physician (Ophthalmology) Mosetta Anis, MD as Referring Physician (Allergy) Hurley Cisco, MD as Consulting Physician (Rheumatology) OTHER MD: Minus Breeding MD, Marshell Garfinkel MD  CHIEF COMPLAINT: M-GUS/ low-grade lymphoproliferative process  CURRENT TREATMENT: IVIG as needed   HISTORY PRESENT ILLNESS::  from the earlier summary note:  "Kimberly Schmid" Merritt is a 77 y/o [place] woman I evaluated originally in October of 2004 for a monoclonal gammopathy. At that time she had a white cell count of 7.2, hemoglobin 12.8, MCV 94.5, and platelets 298,000. Protein workup showed an IgM kappa paraprotein of 0.6 g, with a total IgG slightly depressed at 556, but a normal IgA at 82. Because an IgM paraprotein can be associated with a low grade lymphoproliferative process (Waldenstrm's macroglobulinemia) Kimberly Merritt underwent bone marrow biopsy 11/27/2002, which showed ([id number]) a normal cellular marrow with trilineage hematopoiesis and 5% plasma cells. The peripheral blood film showed no significant abnormalities and on the biopsy  para-spicular lymphoid aggregates were not noted. 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  The patient was followed with observation alone through May of 2009, with no evidence of progression in her IgM kappa clone. As of 06/03/2007 her total IgM spike was 0.83, her IgG was 490 and her IgA was 36. The patient was released from followup here at that time.  More recently, as the 10 year anniversary of her original bone marrow biopsy past, Dr. Dorann Lodge of referred the patient back for reassessment and possible bone marrow rebiopsy. Kimberly Schmid was scheduled here for lab work 09/25/2013. However when she had her blood drawn she complained of some shortness of breath and her pulse was checked. It was 33. The patient was referred to the emergency room where she was evaluated by Dr. Percival Spanish who felt the patient's true heart rate was higher (she has ectopic beats which generated a week pulse and may have been missed) since in the emergency room EKG her rate was 98 with normal sinus rhythm. Troponins were negative. BNP was slightly elevated.  Dr. Percival Spanish set the patient up for an echocardiogram 09/27/2013 and this showed an ejection fraction of 30-35%. There was diffuse hypokinesis. She underwent right and left heart catheterization 10/04/2013 which showed no significant obstructions. The study did confirm an ejection fraction of roughly 35% with global hypokinesis. She is followed by Dr. Percival Spanish for her new diagnosis of non-ischemic cardiomyopathy.   Her subsequent history is as detailed below  Kimberly Merritt returns today for 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 She is well overall, but notes that some days are good and some are quite not as good. She notes that today is a "not so good" day due to the weather. She has moderate generalized aching due to the weather today. She uses Bengay which alleviates her  aches.   She had a routine  bilateral mammogram with CAD completed at the breast center on 10/22/16 with no mammographic evidence of malignancy. Breast density category D.  REVIEW OF SYSTEMS:  Kimberly Schmid reports that she gardens as her form of exercise. She lives in [place], [place] and it is approximately 13-15 miles to the [employer] She reports that she has an persistent cold that comes and goes, but never fully resolves. She notes a fever last week, but she was unable to take a temperature due to her thermometer being broken. She has increased fatigue with activity. She has mild weight loss. She denies unusual headaches, visual changes, nausea, vomiting, or dizziness. There has been no unusual cough, phlegm production, or pleurisy. This been no change in bowel or bladder habits. She denies bleeding, rash, or fever. A detailed review of systems was otherwise stable.    PAST MEDICAL HISTORY: Past Medical History:  Diagnosis Date  . Allergy   . CAD (coronary artery disease)    Nonobstructive. LAD 30% stenosis, small PDA of the circumflex 70% stenosis, right coronary artery 40% stenosis. Catheterization September 2015  . DEPRESSION 08/29/2006  . Diverticulosis   . GERD 08/29/2006  . HEPATIC CYST 08/02/2007  . Hypertension   . HYPOTHYROIDISM 08/29/2006  . LIVER HEMANGIOMA 03/03/2010  . LOW BACK PAIN 08/29/2006  . 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 MUSCLE SPASM 03/03/2010  . Nonischemic cardiomyopathy (HCC)    EF 30%  . OSTEOARTHRITIS 08/29/2006  . OSTEOPENIA 11/19/2008  . PARESTHESIA 03/05/2009  . PVC's (premature ventricular contractions)   . Scoliosis   . VARICOSE VEINS, LOWER EXTREMITIES 08/29/2006    PAST SURGICAL HISTORY: Past Surgical History:  Procedure Laterality Date  . ABDOMINAL HYSTERECTOMY  1981  . APPENDECTOMY    . BACK SURGERY  2010,2006   lumb fusion  . BUNIONECTOMY     both  . BUNIONECTOMY Right 1985  . CARPAL TUNNEL RELEASE     both  . CARPAL TUNNEL RELEASE Right 1992  . CARPAL TUNNEL RELEASE Left  1992  . CATARACT EXTRACTION     both  . CATARACT EXTRACTION Right 2008  . CATARACT EXTRACTION Left 2008  . HAMMER TOE SURGERY Left 1987  . HAMMER TOE SURGERY Right 1986  . LEFT AND RIGHT HEART CATHETERIZATION WITH CORONARY ANGIOGRAM N/A 10/04/2013   Procedure: LEFT AND RIGHT HEART CATHETERIZATION WITH CORONARY ANGIOGRAM;  Surgeon: Leonie Man, MD;  Location: [facility];  Service: Cardiovascular;  Laterality: N/A;  . LUMBAR FUSION  2006  . LUMBAR FUSION  2010  . OOPHORECTOMY  1967  . OVARIAN CYST REMOVAL    . ROTATOR CUFF REPAIR Right 2013  . SHOULDER ARTHROSCOPY  02/09/2011   Procedure: ARTHROSCOPY SHOULDER;  Surgeon: Hessie Dibble, MD;  Location: [place];  Service: Orthopedics;  Laterality: Right;  right shoulder arthroscopy subacromial decompression with rotator cuff repair  . TONSILLECTOMY      FAMILY HISTORY Family History  Problem Relation Age of Onset  . Stroke Mother   . Cancer Father   . Emphysema Father   . Hypertension Other   . Breast cancer Neg Hx    the patient's father died at the age of 31 from lung cancer in the setting of tobacco abuse. [name] mother died at the age of 66 following a stroke. The patient had one brother, who is severely retarded and died from pneumonia at the age of 76. There were no sisters. There is no history of  blood problems or cancer in the family to the patient's knowledge  GYNECOLOGIC HISTORY:  No LMP recorded. Patient is postmenopausal. Menarche age 30. The patient is GX P0. She stopped having periods in 04/03/78 and took hormone replacement for almost 20 years.  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 Her husband died in 04-04-91 from a myocardial infarction. She lives by herself, with one cat for company.    ADVANCED DIRECTIVES: Not in place. If she became disabled she would want [place] to contact her neighbor and third cousin Andrey Cota at [place]:  Social History   Tobacco Use  . Smoking status: Never Smoker  . Smokeless tobacco: Never Used  Substance Use Topics  . Alcohol use: No    Alcohol/week: 0.0 standard drinks  . Drug use: No     Colonoscopy: 2012-04-03  PAP: 04/04/2011  Bone density: 2010-04-03  Lipid panel:  Allergies  Allergen Reactions  . Acetaminophen     REACTION: high dose - tremor  . Alendronate Sodium     Stomach upset.   . Contrast Media [Iodinated Diagnostic Agents]     Syncope.   . Diflunisal     Stomach pain.    . Doxycycline     n/v  . Flexeril [Cyclobenzaprine]     Abdominal pain.    . Nabumetone     Abdominal pain; dark, tarry stool.    . Oxybutynin     Pt does not remember.    . Raloxifene     Unknown.    . Sulfa Antibiotics Nausea Only    Syncope.    . Symbicort [Budesonide-Formoterol Fumarate]     "Jittery-ness."   . Tramadol Hcl     Numbness.    . Diclofenac Palpitations    Dark, tarry stool.    . Latex Rash    Current Outpatient Medications  Medication Sig Dispense Refill  . acyclovir (ZOVIRAX) 400 MG tablet TAKE 1 TABLET (400 MG TOTAL) BY MOUTH 2 TIMES DAILY. 60 tablet 4  . carvedilol (COREG) 12.5 MG tablet Take 1 tablet (12.5 mg total) by mouth 2 (two) times daily. 180 tablet 3  . Cholecalciferol (VITAMIN D3) 1000 UNITS CAPS Take 2 capsules by mouth daily.     . cromolyn (NASALCROM) 5.2 MG/ACT nasal spray Place 1 spray into both nostrils 4 (four) times daily.    Marland Kitchen EPINEPHrine 0.3 mg/0.3 mL IJ SOAJ injection   1  . fexofenadine (ALLEGRA) 180 MG tablet Take 180 mg by mouth daily.    Marland Kitchen glucosamine-chondroitin 500-400 MG tablet Take 1 tablet by mouth 2 (two) times daily.     Marland Kitchen losartan (COZAAR) 25 MG tablet Take 1 tablet (25 mg total) by mouth daily. 90 tablet 1  . NON FORMULARY Allergy injections     . Polyethyl Glycol-Propyl Glycol (SYSTANE ULTRA) 0.4-0.3 % SOLN Apply to eye. 2 drops in each eye 3 to 4 times daily    . ranitidine (ZANTAC) 150 MG tablet Take 150 mg by mouth 2  (two) times daily.    Marland Kitchen SALINE NASAL SPRAY NA Place into the nose. 2 sprays each nostril every 4 hours    . sucralfate (CARAFATE) 1 g tablet Take 1 g by mouth daily.    Marland Kitchen SYNTHROID 25 MCG tablet TAKE 1 TABLET BY MOUTH DAILY BEFORE BREAKFAST. 90 tablet 1   No current facility-administered medications for this visit.     OBJECTIVE: Older white  woman in no acute distress  There were no vitals filed for this visit.   There is no height or weight on file to calculate BMI.    ECOG FS:2 - Symptomatic, <50% confined to bed  Sclerae unicteric, EOMs intact Oropharynx clear and moist No cervical or supraclavicular adenopathy Lungs no rales or rhonchi Heart regular rate and rhythm Abd soft, nontender, positive bowel sounds MSK no focal spinal tenderness, no upper extremity lymphedema Neuro: nonfocal, well oriented, appropriate affect Breasts: No masses detected in either breast.  Both axillae are benign    LAB RESULTS: Her IgM has risen some, from 1151 last year to 131 for now.  Her IgG remains quite low at 374.  The M spike remains at less than a gram, 0.8 currently.  This is an IgM kappa paraprotein  CMP     Component Value Date/Time   NA 138 11/24/2017 1045   NA 136 11/04/2016 1115   K 4.7 11/24/2017 1045   K 4.8 11/04/2016 1115   CL 101 11/24/2017 1045   CO2 28 11/24/2017 1045   CO2 27 11/04/2016 1115   GLUCOSE 73 11/24/2017 1045   GLUCOSE 95 11/04/2016 1115   BUN 17 11/24/2017 1045   BUN 16.7 11/04/2016 1115   CREATININE 0.83 11/24/2017 1045   CREATININE 0.8 11/04/2016 1115   CALCIUM 9.4 11/24/2017 1045   CALCIUM 9.0 11/04/2016 1115   PROT 6.9 11/24/2017 1045   PROT 6.6 11/04/2016 1115   PROT 6.3 11/04/2016 1115   ALBUMIN 3.7 11/24/2017 1045   ALBUMIN 3.5 11/04/2016 1115   AST 19 11/24/2017 1045   AST 23 11/04/2016 1115   ALT 15 11/24/2017 1045   ALT 18 11/04/2016 1115   ALKPHOS 87 11/24/2017 1045   ALKPHOS 82 11/04/2016 1115   BILITOT 0.4 11/24/2017 1045   BILITOT  0.40 11/04/2016 1115   GFRNONAA >60 11/24/2017 1045   GFRNONAA 69 10/02/2013 1234   GFRAA >60 11/24/2017 1045   GFRAA 80 10/02/2013 1234    I No results found for: SPEP  Lab Results  Component Value Date   WBC 7.9 11/24/2017   NEUTROABS 3.2 11/24/2017   HGB 12.9 11/24/2017   HCT 39.9 11/24/2017   MCV 97.3 11/24/2017   PLT 304 11/24/2017      Chemistry      Component Value Date/Time   NA 138 11/24/2017 1045   NA 136 11/04/2016 1115   K 4.7 11/24/2017 1045   K 4.8 11/04/2016 1115   CL 101 11/24/2017 1045   CO2 28 11/24/2017 1045   CO2 27 11/04/2016 1115   BUN 17 11/24/2017 1045   BUN 16.7 11/04/2016 1115   CREATININE 0.83 11/24/2017 1045   CREATININE 0.8 11/04/2016 1115   GLU 89 04/08/2016      Component Value Date/Time   CALCIUM 9.4 11/24/2017 1045   CALCIUM 9.0 11/04/2016 1115   ALKPHOS 87 11/24/2017 1045   ALKPHOS 82 11/04/2016 1115   AST 19 11/24/2017 1045   AST 23 11/04/2016 1115   ALT 15 11/24/2017 1045   ALT 18 11/04/2016 1115   BILITOT 0.4 11/24/2017 1045   BILITOT 0.40 11/04/2016 1115       No results found for: LABCA2  No components found for: LABCA125  No results for input(s): INR in the last 168 hours.  Urinalysis    Component Value Date/Time   COLORURINE LT. YELLOW 06/08/2011 0830   APPEARANCEUR CLEAR 06/08/2011 0830   LABSPEC 1.010 06/08/2011 0830   PHURINE 6.5  06/08/2011 0830   GLUCOSEU NEGATIVE 06/08/2011 0830   HGBUR NEGATIVE 06/08/2011 0830   BILIRUBINUR NEGATIVE 06/08/2011 0830   KETONESUR NEGATIVE 06/08/2011 0830   PROTEINUR NEGATIVE 04/19/2008 1216   UROBILINOGEN 0.2 06/08/2011 0830   NITRITE NEGATIVE 06/08/2011 0830   LEUKOCYTESUR NEGATIVE 06/08/2011 0830    STUDIES: Lab work discussed in detail with the patient  Bilateral screening mammography October 22, 2016 showed the breast density to be category D.  There was no malignancy noted  ASSESSMENT: 77 y.o. 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 When it causes amyloidosis of the heart, which is quite rare, the pattern is restrictive. 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 In case of an infection, IVIG supplementation should be considered  (a) received IVIG October 2016  (b) Pete IVIG October 2018  PLAN:  Kimberly Schmid is generally stable and does not show clear progression to multiple myeloma or to a more aggressive myeloproliferative problem.  On the other hand she still has a very low IVIG level and she has been fighting a "cold" now for about 2 months, despite a course of antibiotics.  I think she would benefit from IVIG supplementation, which hopefully will carry her through the winter.  She understands the possible toxicities, side effects and complications of this, which she did not experience with her first dose 2 years ago.  This has been scheduled for December 07, 2016.  I am going to start following her labs a little bit more closely, every few months, although she still will see me only on a once a year basis unless some other problem develops between visits.  She knows to call for any other problems that may develop between then.  Elijahjames Fuelling, Virgie Dad, MD  12/01/17 8:02 AM Medical Oncology and Hematology [facility] [address] [place], [place] [address] Tel. [phone]    Fax. [phone]  This document serves as a record of services personally performed by Lurline Del, MD. It was created on her behalf by Steva Colder, a trained medical scribe. The creation of this record is based on the scribe's personal observations and the provider's statements to them. This document has been checked and approved by the attending provider.

## 2017-12-06 ENCOUNTER — Ambulatory Visit (INDEPENDENT_AMBULATORY_CARE_PROVIDER_SITE_OTHER): Payer: Self-pay | Admitting: *Deleted

## 2017-12-06 ENCOUNTER — Encounter: Payer: Self-pay | Admitting: Vascular Surgery

## 2017-12-06 DIAGNOSIS — I868 Varicose veins of other specified sites: Secondary | ICD-10-CM

## 2017-12-06 DIAGNOSIS — I83892 Varicose veins of left lower extremities with other complications: Secondary | ICD-10-CM

## 2017-12-06 NOTE — Progress Notes (Signed)
X=.3% Sotradecol administered with a 27g butterfly.  Patient received a total of 6cc.  Treated bleed area on left ankle/calf and all other areas that looked like they might bleed. Easy access. Tol well. Anticipate good results. Follow prn.  Photos: Yes.    Compression stockings applied: Yes.  (Knee highs and ace over right knee vein).

## 2017-12-08 ENCOUNTER — Other Ambulatory Visit: Payer: Self-pay | Admitting: Cardiology

## 2017-12-12 ENCOUNTER — Inpatient Hospital Stay: Payer: Medicare Other | Attending: Oncology

## 2017-12-12 ENCOUNTER — Ambulatory Visit: Payer: Medicare Other | Admitting: Oncology

## 2017-12-12 ENCOUNTER — Other Ambulatory Visit: Payer: Medicare Other

## 2017-12-12 VITALS — BP 152/83 | HR 72 | Temp 99.0°F | Resp 16

## 2017-12-12 DIAGNOSIS — D849 Immunodeficiency, unspecified: Secondary | ICD-10-CM | POA: Insufficient documentation

## 2017-12-12 DIAGNOSIS — F329 Major depressive disorder, single episode, unspecified: Secondary | ICD-10-CM | POA: Diagnosis not present

## 2017-12-12 DIAGNOSIS — R053 Chronic cough: Secondary | ICD-10-CM

## 2017-12-12 DIAGNOSIS — M8589 Other specified disorders of bone density and structure, multiple sites: Secondary | ICD-10-CM | POA: Insufficient documentation

## 2017-12-12 DIAGNOSIS — I10 Essential (primary) hypertension: Secondary | ICD-10-CM | POA: Diagnosis not present

## 2017-12-12 DIAGNOSIS — E039 Hypothyroidism, unspecified: Secondary | ICD-10-CM | POA: Diagnosis not present

## 2017-12-12 DIAGNOSIS — D801 Nonfamilial hypogammaglobulinemia: Secondary | ICD-10-CM | POA: Diagnosis not present

## 2017-12-12 DIAGNOSIS — D472 Monoclonal gammopathy: Secondary | ICD-10-CM

## 2017-12-12 DIAGNOSIS — I2510 Atherosclerotic heart disease of native coronary artery without angina pectoris: Secondary | ICD-10-CM | POA: Diagnosis not present

## 2017-12-12 DIAGNOSIS — K130 Diseases of lips: Secondary | ICD-10-CM

## 2017-12-12 DIAGNOSIS — R05 Cough: Secondary | ICD-10-CM

## 2017-12-12 MED ORDER — DEXTROSE 5 % IV SOLN
INTRAVENOUS | Status: DC
  Administered 2017-12-12: 10:00:00 via INTRAVENOUS
  Filled 2017-12-12: qty 250

## 2017-12-12 MED ORDER — FAMOTIDINE 20 MG PO TABS
20.0000 mg | ORAL_TABLET | Freq: Once | ORAL | Status: AC
  Administered 2017-12-12: 20 mg via ORAL

## 2017-12-12 MED ORDER — DIPHENHYDRAMINE HCL 25 MG PO TABS
12.5000 mg | ORAL_TABLET | Freq: Once | ORAL | Status: DC
  Filled 2017-12-12: qty 0.5

## 2017-12-12 MED ORDER — IMMUNE GLOBULIN (HUMAN) 20 GM/200ML IV SOLN
1.0000 g/kg | Freq: Once | INTRAVENOUS | Status: AC
  Administered 2017-12-12: 60 g via INTRAVENOUS
  Filled 2017-12-12: qty 600

## 2017-12-12 MED ORDER — ACETAMINOPHEN 325 MG PO TABS: 650.0000 mg | ORAL_TABLET | Freq: Once | ORAL | Status: DC

## 2017-12-12 MED ORDER — FAMOTIDINE 20 MG PO TABS
ORAL_TABLET | ORAL | Status: AC
  Filled 2017-12-12: qty 1

## 2017-12-12 MED ORDER — DIPHENHYDRAMINE HCL 12.5 MG/5ML PO ELIX
12.5000 mg | ORAL_SOLUTION | Freq: Once | ORAL | Status: AC
  Administered 2017-12-12: 12.5 mg via ORAL

## 2017-12-12 NOTE — Patient Instructions (Signed)

## 2017-12-12 NOTE — Progress Notes (Signed)
Spoke w/ Val - okay to add famotidine to pre-meds for IVIG today d/t intolerance to APAP and history of infusion reactions.   Demetrius Charity, PharmD, [facility] Pharmacist Pharmacy Phone: [phone] 12/12/2017

## 2017-12-12 NOTE — Progress Notes (Signed)
Per. Dr. Jana Hakim, pt ok to be d/c despite elevated BP. Pt to continue taking BP medication as prescribed and to monitor BP at home. Pt verbalized understanding.

## 2017-12-19 DIAGNOSIS — J3089 Other allergic rhinitis: Secondary | ICD-10-CM | POA: Diagnosis not present

## 2017-12-19 DIAGNOSIS — J3081 Allergic rhinitis due to animal (cat) (dog) hair and dander: Secondary | ICD-10-CM | POA: Diagnosis not present

## 2017-12-19 DIAGNOSIS — J301 Allergic rhinitis due to pollen: Secondary | ICD-10-CM | POA: Diagnosis not present

## 2017-12-21 ENCOUNTER — Encounter: Payer: Medicare Other | Admitting: Vascular Surgery

## 2017-12-21 DIAGNOSIS — I868 Varicose veins of other specified sites: Secondary | ICD-10-CM

## 2017-12-22 ENCOUNTER — Ambulatory Visit: Payer: Medicare Other | Admitting: *Deleted

## 2017-12-30 ENCOUNTER — Telehealth: Payer: Self-pay | Admitting: *Deleted

## 2017-12-30 MED ORDER — AMOXICILLIN-POT CLAVULANATE 875-125 MG PO TABS: 1.0000 | ORAL_TABLET | Freq: Two times a day (BID) | ORAL | 0 refills | Status: DC

## 2017-12-30 NOTE — Telephone Encounter (Signed)
This RN spoke with pt per her call stating continued sinus congestion now with productive cough " that is brown ".  Amaiah states she has sinus congestion with drainage and " feel like my bronchials are tight "  She does not have an active fever - reports a " low grade fever " early this week x 1 ( not greater then 100.5 ).  She has been using cromolyn nasal spray and today took Human resources officer.  Klynn states she has attempted to call her primary MD - but is only getting a recording for the receptionist " and I am concerned now that I have brown sputum "  Per review with [facility] provider- obtained order for antibiotic.  This RN informed pt of recommendation in addition to the antibiotic she is to use muccinex and sudafed ( 12 hour formulation ) until completed with the antibiotic.   Kenady verbalized understanding.  This RN called prescription to her pharmacy as well as need for OTC muccinex and sudafed ( store brand,12 hour formulation ) with instructions.

## 2018-01-12 DIAGNOSIS — J3089 Other allergic rhinitis: Secondary | ICD-10-CM | POA: Diagnosis not present

## 2018-01-12 DIAGNOSIS — J3081 Allergic rhinitis due to animal (cat) (dog) hair and dander: Secondary | ICD-10-CM | POA: Diagnosis not present

## 2018-01-12 DIAGNOSIS — J301 Allergic rhinitis due to pollen: Secondary | ICD-10-CM | POA: Diagnosis not present

## 2018-02-02 DIAGNOSIS — J301 Allergic rhinitis due to pollen: Secondary | ICD-10-CM | POA: Diagnosis not present

## 2018-02-02 DIAGNOSIS — J3089 Other allergic rhinitis: Secondary | ICD-10-CM | POA: Diagnosis not present

## 2018-02-02 DIAGNOSIS — J3081 Allergic rhinitis due to animal (cat) (dog) hair and dander: Secondary | ICD-10-CM | POA: Diagnosis not present

## 2018-02-14 ENCOUNTER — Ambulatory Visit (INDEPENDENT_AMBULATORY_CARE_PROVIDER_SITE_OTHER): Payer: Medicare Other | Admitting: Family Medicine

## 2018-02-14 ENCOUNTER — Telehealth: Payer: Self-pay | Admitting: Family Medicine

## 2018-02-14 ENCOUNTER — Encounter: Payer: Self-pay | Admitting: Family Medicine

## 2018-02-14 VITALS — BP 127/75 | HR 83 | Temp 97.5°F | Ht 63.0 in | Wt 125.0 lb

## 2018-02-14 DIAGNOSIS — J069 Acute upper respiratory infection, unspecified: Secondary | ICD-10-CM | POA: Diagnosis not present

## 2018-02-14 DIAGNOSIS — J019 Acute sinusitis, unspecified: Secondary | ICD-10-CM

## 2018-02-14 DIAGNOSIS — R05 Cough: Secondary | ICD-10-CM

## 2018-02-14 DIAGNOSIS — R059 Cough, unspecified: Secondary | ICD-10-CM

## 2018-02-14 MED ORDER — HYDROCOD POLST-CPM POLST ER 10-8 MG/5ML PO SUER: 5.0000 mL | Freq: Two times a day (BID) | ORAL | 0 refills | Status: DC | PRN

## 2018-02-14 MED ORDER — PREDNISONE 5 MG (48) PO TBPK: ORAL_TABLET | ORAL | 0 refills | Status: DC

## 2018-02-14 NOTE — Patient Instructions (Addendum)
-Per your request we gave you the prednisone Dosepak since you said that worked well for you in the past and you tolerated it well.  Also since you have not had any oral steroids in over a year, we also went with this.  This will help your congestion and sinus pressure headache as well as the cough.   If you are not turning the corner and feeling better within 3 days-by Friday morning, please call the office as we will call in an antibiotic for you.  My recommendation would be Omnicef or Ceftin at that time if needed.  However, as we discussed, you have been on several antibiotics and the most recent within the past 5-1/2 weeks, I do not see an indication for antibiotics now but if you need it, we will give it  -Also I gave you the Tussionex cough medicine as you said you had this in the past and it worked well.  Again, per our discussion, recall that this can make you feel tired and drunk-like feeling so please be careful if you have to get up in the middle of the night to go to the bathroom etc.  As we discussed, use your 4-point walker at all times       Symptoms for a viral upper respiratory tract infection usually last 3-7 days but can stretch out to 2-3 weeks before you're feeling back to normal.  Your symptoms should not worsen after 7-10 days and if they truely do, please notify our office, as you may need antibiotics.  You can use over-the-counter afrin nasal spray for up to 3 days (NO longer than that) which will help acutely with nasal drainage/ congestion short term.   Also, sterile saline nasal rinses, such as Milta Deiters med or AYR sinus rinses, can be very helpful and should be done twice daily- especially throughout the allergy season.   Remember you should use distilled water or previously boiled water to do this.   You can also use an over the counter cold and flu medication such as Tylenol Severe Cold and Sinus/Flu or Dayquil, Nyquil and the like, which will help with cough, congestion,  headache/ pain, fevers/chills etc.  Please note, if you being treated for hypertension or have high blood pressure, you should be using the cold meds designated "HBP".    Unfortunately, antibiotics are not helpful for viral infections.  Wash your hands frequently, as you did not want to get those around you sick as well. Never sneeze or cough on others.  And you should not be going to school or work if you are running a temperature of 100.5 or more on two separate occasions.   Drink plenty of fluids and stay hydrated, especially if you are running fevers.  We don't know why, but chicken soup also helps, try it! :)        Upper respiratory viral infection, Adult Adenoviruses are common viruses that cause many different types of infections. The viruses usually affect the lungs, but they can also affect other parts of the body, including the eyes, stomach, bowels, bladder, and brain. The most common type of adenovirus infection is the common cold. Usually, adenovirus infections are not severe unless you have another health problem that makes it hard for your body to fight off infection. What are the causes? You can get this condition if you:  Touch a surface or object that has an adenovirus on it and then touch your mouth, nose, or eyes with unwashed  hands.  Come into close physical contact with an infected person, such as by hugging or shaking hands.  Breathe in droplets that fly through the air when an infected person talks, coughs, or sneezes.  Have contact with infected stool.  Swim in a pool that does not have enough chlorine.  Adenoviruses can live outside the body for many weeks. They spread easily from person to person (are contagious). What increases the risk? This condition is more likely to develop in:  People who spend a lot of time in places where there are many people, such as schools, summer camps, daycare centers, community centers, and [employer] recruit training  centers.  Elderly adults.  People with a weak body defense system (immune system).  People with a lung disease.  People with a heart condition.  What are the signs or symptoms? Adenovirus infections usually cause flu-like symptoms. Once the virus gets into the body, symptoms of this condition can take up to 14 days to develop. Symptoms may include:  Headache.  Stiff neck.  Sleepiness or fatigue.  Confusion or disorientation.  Fever.  Sore throat.  Cough.  Trouble breathing.  Runny nose or congestion.  Pink eye (conjunctivitis).  Bleeding into the covering of the eye.  Stomachache or diarrhea.  Nausea or vomiting.  Blood in the urine or pain while urinating.  Ear pain or fullness.  How is this diagnosed? This condition may be diagnosed based on your symptoms and a physical exam. Your health care provider may order tests to make sure your symptoms are not caused by another type of problem. Tests can include:  Blood tests.  Urine tests.  Stool tests.  Chest X-ray.  Tissue or throat culture.  How is this treated? This condition goes away on its own with time. Treatment for this condition involves managing symptoms until the condition goes away. Your health care provider may recommend:  Rest.  Drinking more fluids.  Taking over-the-counter medicine to help relieve a sore throat, fever, or headache.  Follow these instructions at home:  Rest at home until your symptoms go away.  Drink enough fluid to keep your urine clear or pale yellow.  Take over-the-counter and prescription medicines only as told by your health care provider.  Keep all follow-up visits as told by your health care provider. This is important. How is this prevented? Adenoviruses are resistant to many cleaning products and can remain on surfaces for long periods of time. To help prevent infection:  Wash your hands often with soap and water.  Cover your nose or mouth when you  sneeze or cough.  Do not touch your eyes, nose, or mouth with unwashed hands.  Clean commonly used objects often.  Do not swim in a pool that is not properly chlorinated.  Avoid close contact with people who are sick.  Do not go to school or work when you are sick.  Contact a health care provider if:  Your symptoms do not improve after 10 days.  Your symptoms get worse.  You cannot eat or drink without vomiting. Get help right away if:  You have trouble breathing or you are breathing rapidly.  Your skin, lips, or fingernails look blue (cyanosis).  You have a rapid heart rate.  You become confused.  You lose consciousness. This information is not intended to replace advice given to you by your health care provider. Make sure you discuss any questions you have with your health care provider. Document Released: 04/10/2002 Document Revised: 09/15/2015  Document Reviewed: 09/15/2015 Elsevier Interactive Patient Education  [employer].

## 2018-02-14 NOTE — Telephone Encounter (Signed)
Patient is requesting call back from nurse about the cough med prescribed at today's visit, she was unaware this had hydrocodone in it and declined this med at the pharmacy. She would like to discuss other options. Please advise

## 2018-02-14 NOTE — Telephone Encounter (Signed)
Noted and information and added to the chart. MPulliam, CMA/RT(R)

## 2018-02-14 NOTE — Progress Notes (Signed)
Acute Care Office visit  Assessment and plan:  1. Acute URI- >er 10 d   2. Cough- due to PND   3. Acute rhinosinusitis     - Patient states she feels horrible today due to upper respiratory symptoms and desires to change her chronic follow-up visit to an acute care visit.  - Patient agrees to re-schedule her chronic care office visit.  - Last time she took antibiotics was the 29th of November, Augmentin.  1. Acute URI: Sinusitis - Viral vs Allergic vs Bacterial causes for pt's symptoms reveiwed.    - Supportive care and various OTC medications discussed in addition to any prescribed.  - Prednisone prescribed today.  In the past twelve months, patient had a steroid injection in the right hip. Has not had steroids in the form of tablets.  "It's been years."   - Patient confirms that she has taken steroids in the past.  Risks were reviewed with patient and she understands this and agrees to take medication.  - Tussionex prescribed for patient's cough.  Reviewed that this can cause feelings of tiredness, grogginess, sluggishness, and may increase risks of falling if patient gets up in the middle of the night.  Encouraged patient to keep walker by her bedside for assistance while walking with Tussionex. - Patient confirms that she has taken Tussionex in the past.  Risks were reviewed with patient and she understands this and agrees to take medication.  - If patient is not better in 2-3 days, by early Friday morning, Omnicef 300 mg twice daily *10 days will be called in.   - Call or RTC if new symptoms, or if no improvement or worse over next several days.    - Patient knows to come in for acute care sick visits if she ever feels poorly.     Meds ordered this encounter  Medications  . predniSONE (STERAPRED UNI-PAK 48 TAB) 5 MG (48) TBPK tablet    Sig: 12 day dosepack po    Dispense:  48 tablet    Refill:  0  . chlorpheniramine-HYDROcodone (TUSSIONEX) 10-8 MG/5ML SUER    Sig:  Take 5 mLs by mouth every 12 (twelve) hours as needed for cough (cough, will cause drowsiness.).    Dispense:  120 mL    Refill:  0    Medications Discontinued During This Encounter  Medication Reason  . amoxicillin-clavulanate (AUGMENTIN) 875-125 MG tablet Completed Course  . fexofenadine (ALLEGRA) 180 MG tablet Change in therapy  . ranitidine (ZANTAC) 150 MG tablet      Gross side effects, risk and benefits, and alternatives of medications discussed with patient.  Patient is aware that all medications have potential side effects and we are unable to predict every sideeffect or drug-drug interaction that may occur.  Expresses verbal understanding and consents to current therapy plan and treatment regiment.   Education and routine counseling performed. Handouts provided.  Anticipatory guidance and routine counseling done re: condition, txmnt options and need for follow up. All questions of patient's were answered.  Return for 2) she needs to make a chronic follow-up in the very near future as today we did a acute care visit.  Please see AVS handed out to patient at the end of our visit for additional patient instructions/ counseling done pertaining to today's office visit.  Note:  This document was partially repared using Dragon voice recognition software and may include unintentional dictation errors.  This document serves as a record of services  personally performed by Mellody Dance, DO. It was created on her behalf by [name], a trained medical scribe. The creation of this record is based on the scribe's personal observations and the provider's statements to them.   I have reviewed the above medical documentation for accuracy and completeness and I concur.  Mellody Dance, DO 02/14/2018 12:25 PM       Subjective:    Chief Complaint  Patient presents with  . Follow-up   Patient states she feels horrible today due to upper respiratory symptoms.  Last time  she took antibiotics was the 29th of November, Augmentin.  HPI:  Pt presents with Sx for about ten days, starting on January 4th (today is the 14th).  Went in to get her allergy shot on January 2nd and notes a "sick patient there at the office."   C/o:  On January 4th, experienced cough, fever, chills.  Her temperature at that time was 97.7 which she describes as a "low grade fever" since her "normal temp is 97."  Had a thick white mucus.  The last few days, her primary symptoms have been insomnia, "waking up every hour coughing," expectorating.  Started to feel somewhat better yesterday, but then could not sleep last night.   Says "I'm feeling weak and I was short of breath this morning."  Feels she has congestion and "light pain" in her [bronchiole?].  Denies:  Wheezing, "not like with pneumonia."    For symptoms patient has tried:  Nasalcrom, Claritin, used vaporizer, Vicks' Vapo Rub.  Overall getting:  Symptoms improved, "got a little better," then worsened again.  Overall feels she is not getting better.  Last took antibiotics (Augmentin) on November 29th.  Says the Augmentin seemed to work, 'but right at the very end, it was like it kinda drug out a little bit."  Says "it usually took 10 days for antibiotics to work."    Patient Care Team    Relationship Specialty Notifications Start End  Mellody Dance, DO PCP - General Family Medicine  03/29/17   Melrose Nakayama, MD Attending Physician Orthopedic Surgery  05/28/11   Magrinat, Virgie Dad, MD  Hematology and Oncology  05/28/11   Clarene Essex, MD  Gastroenterology  05/28/11   Rutherford Guys, MD Attending Physician Ophthalmology  05/28/11   Jovita Gamma, MD Consulting Physician Neurosurgery  08/21/13   Minus Breeding, MD Consulting Physician Cardiology  11/29/13   Levy Sjogren, MD Referring Physician Dermatology  04/05/17   Irine Seal, MD Attending Physician Urology  04/05/17   Mosetta Anis, MD Referring Physician  Allergy  04/05/17   Elam Dutch, MD Consulting Physician Vascular Surgery  02/02/18     Past medical history, Surgical history, Family history reviewed and noted below, Social history, Allergies, and Medications have been entered into the medical record, reviewed and changed as needed.   Allergies  Allergen Reactions  . Acetaminophen Rash    REACTION: high dose - tremor Per pt - rash on chest and heart palpitations  . Alendronate Sodium     Stomach upset.   . Contrast Media [Iodinated Diagnostic Agents]     Syncope.   . Diflunisal     Stomach pain.    . Doxycycline     n/v  . Flexeril [Cyclobenzaprine]     Abdominal pain.    . Nabumetone     Abdominal pain; dark, tarry stool.    . Oxybutynin     Pt does not remember.    Marland Kitchen  Raloxifene     Unknown.    . Sulfa Antibiotics Nausea Only    Syncope.    . Symbicort [Budesonide-Formoterol Fumarate]     "Jittery-ness."   . Tramadol Hcl     Numbness.    . Diclofenac Palpitations    Dark, tarry stool.    . Latex Rash    Review of Systems: - see above HPI for pertinent positives General:   No F/C, wt loss Pulm:   No DIB, pleuritic chest pain Card:  No CP, palpitations Abd:  No n/v/d or pain Ext:  No inc edema from baseline   Objective:   Blood pressure 127/75, pulse 83, temperature (!) 97.5 F (36.4 C), height 5\' 3"  (1.6 m), weight 125 lb (56.7 kg), SpO2 99 %. Body mass index is 22.14 kg/m. General: Well Developed, well nourished, appropriate for stated age.  Neuro: Alert and oriented x3, extra-ocular muscles intact, sensation grossly intact.  HEENT: Normocephalic, atraumatic, pupils equal round reactive to light, neck supple, no masses, no painful lymphadenopathy, TM's intact B/L, no acute findings. Nares- patent, clear d/c, OP- clear, mild erythema, No TTP sinuses Skin: Warm and dry, no gross rash. Cardiac: RRR, S1 S2,  no murmurs rubs or gallops.  Respiratory: ECTA B/L and A/P, Not using accessory muscles, speaking in  full sentences- unlabored. Vascular:  No gross lower ext edema, cap RF less 2 sec. Psych: No HI/SI, judgement and insight good, Euthymic mood. Full Affect.

## 2018-02-14 NOTE — Telephone Encounter (Signed)
Spoke to the patient and advised per Dr. Raliegh Scarlet to try OTC cough medication. MPulliam, CMA/RT(R)

## 2018-02-14 NOTE — Telephone Encounter (Signed)
Patient states she was confused when she states at earlier OV today that she could take the Tussonex cough meds ( so she declined it at the pharmacy) & wants it on record in her chart that she does not take/ her cardiologist advised her that  Rx's with codeine could affect her heart condition.   --Forwarding message to medical assistant to either remove it from list of her meds  & share updated info w/provider.  --glh

## 2018-02-16 ENCOUNTER — Telehealth: Payer: Self-pay | Admitting: Family Medicine

## 2018-02-16 NOTE — Telephone Encounter (Signed)
Patient states still feeling bad, and unfortunately she couldn't afford/ nor take Tussionex (but has taken the prednisone )     predniSONE (STERAPRED UNI-PAK 48 TAB) 5 MG (48) TBPK tablet    Sig: 12 day dosepack po    Dispense:  48 tablet    Refill:  0   chlorpheniramine-HYDROcodone (TUSSIONEX) 10-8 MG/5ML SUER    Sig: Take 5 mLs by mouth every 12 (twelve) hours as needed for cough (cough, will cause drowsiness.).    Dispense:  120 mL    Refill:  0   See  Partial office notes below: If you are not turning the corner and feeling better within 3 days-by Friday morning, please call the office as we will call in an antibiotic for you.  My recommendation would be Omnicef or Ceftin at that time if needed.  However, as we discussed, you have been on several antibiotics and the most recent within the past 5-1/2 weeks, I do not see an indication for antibiotics now but if you need it, we will give it  -Also I gave you the Tussionex cough medicine as you said you had this in the past and it worked well.  Again, per our discussion, recall that this can make you feel tired and drunk-like feeling so please be careful if you have to get up in the middle of the night to go to the bathroom etc.  As we discussed, use your 4-point walker at all times   Forwarding note to medical assistant,  Pt advised (Dr.O is out of the office till Monday). Also will relay that patient continues to feel bad & wonders if any replacement Rx can be called in.  Call pt at [phone] with any updates.  --glh

## 2018-02-16 NOTE — Telephone Encounter (Signed)
Patient was seen 02/15/2017 by Dr. Raliegh Scarlet for URI symptoms.  Patient states that symptoms are worse and she would like to go ahead with an antibiotic but states that he insurance doesn't cover or is tier 3 ones that Dr. Raliegh Scarlet mentioned at [facility] and patient can not afford that.  Patient states that she has used Levofloxacin in past for same symptoms and it worked well and is covered by Insurance underwriter.  Please review and advise. MPulliam, CMA/RT(R)

## 2018-02-17 ENCOUNTER — Other Ambulatory Visit: Payer: Self-pay | Admitting: Adult Health

## 2018-02-17 MED ORDER — LEVOFLOXACIN 500 MG PO TABS: 500.0000 mg | ORAL_TABLET | Freq: Every day | ORAL | 0 refills | Status: DC

## 2018-02-17 NOTE — Telephone Encounter (Signed)
Good Afternoon Kimberly Merritt, Can you please call Kimberly Merritt and share I sent in Levofloxacin 500mg   Please tell her remain well hydrated and rest. If she still does not fell well after she finishes ABX, please f./u with Dr. Raliegh Scarlet Thanks! [name]

## 2018-02-23 DIAGNOSIS — J3081 Allergic rhinitis due to animal (cat) (dog) hair and dander: Secondary | ICD-10-CM | POA: Diagnosis not present

## 2018-02-23 DIAGNOSIS — J301 Allergic rhinitis due to pollen: Secondary | ICD-10-CM | POA: Diagnosis not present

## 2018-02-23 DIAGNOSIS — J3089 Other allergic rhinitis: Secondary | ICD-10-CM | POA: Diagnosis not present

## 2018-03-08 ENCOUNTER — Ambulatory Visit: Payer: Medicare Other | Admitting: Family Medicine

## 2018-03-09 ENCOUNTER — Encounter: Payer: Self-pay | Admitting: Family Medicine

## 2018-03-09 ENCOUNTER — Ambulatory Visit (INDEPENDENT_AMBULATORY_CARE_PROVIDER_SITE_OTHER): Payer: Medicare Other | Admitting: Family Medicine

## 2018-03-09 VITALS — BP 112/59 | HR 63 | Temp 97.9°F | Ht 63.0 in | Wt 126.8 lb

## 2018-03-09 DIAGNOSIS — E785 Hyperlipidemia, unspecified: Secondary | ICD-10-CM

## 2018-03-09 DIAGNOSIS — R7303 Prediabetes: Secondary | ICD-10-CM | POA: Diagnosis not present

## 2018-03-09 DIAGNOSIS — K219 Gastro-esophageal reflux disease without esophagitis: Secondary | ICD-10-CM

## 2018-03-09 DIAGNOSIS — J3089 Other allergic rhinitis: Secondary | ICD-10-CM

## 2018-03-09 DIAGNOSIS — E559 Vitamin D deficiency, unspecified: Secondary | ICD-10-CM

## 2018-03-09 DIAGNOSIS — I10 Essential (primary) hypertension: Secondary | ICD-10-CM

## 2018-03-09 DIAGNOSIS — E039 Hypothyroidism, unspecified: Secondary | ICD-10-CM | POA: Diagnosis not present

## 2018-03-09 MED ORDER — RANITIDINE HCL 75 MG PO TABS: 150.0000 mg | ORAL_TABLET | Freq: Two times a day (BID) | ORAL | 1 refills | Status: DC

## 2018-03-09 NOTE — Progress Notes (Signed)
Impression and Recommendations:    1. Prediabetes   2. Hypothyroidism (acquired)   3. Vitamin D insufficiency   4. Gastroesophageal reflux disease, esophagitis presence not specified   5. Hyperlipidemia, unspecified hyperlipidemia type-  cardiology   6. Hypertension, unspecified type- txed by Cardiology per pt   7. Environmental and seasonal allergies     1. Prediabetes A1c well controlled at 5.9 again.  This is a what it was approximately 6 months ago. -We discussed prudent diet and moving more.  2. Hypothyroidism (acquired) Patient was last checked for TSH back in 04/2017.   - She will follow-up in the near future future for Medicare wellness (she told me she will schedule this ) where we can obtain this repeat lab of TSH and free T4  3. Vitamin D insufficiency Patient states she has been taking her vitamin D supplements regularly.  We will continue to monitor.  She has no complaints today.  4. Gastroesophageal reflux disease, esophagitis presence not specified Per patient reflux symptoms are worse since she changed to famotidine from ranitidine.  She is taking 20 mg 2 times daily. -DC famotidine and will go with ranitidine 75 mg tablets 2 p.o. twice daily  5. Hyperlipidemia, unspecified hyperlipidemia type-  cardiology This is managed by cardiology.  When recently checked 1 to 2 months ago she was at goal.  6. Hypertension, unspecified type- txed by Cardiology per pt -Managed by her cardiologist.  Patient blood pressures well controlled today without symptoms  7.  environmental and seasonal allergies -Patient sees her allergist Dr. Murray Hodgkins.  He has her on Nasalcrom and other medicines however patient is allergic to cats and she has a cat in the home and is unwilling to get rid of it.  She understands she will have to live with the symptoms -Any further questions or concerns should be directed to her allergist whom she sees at least on a yearly  basis.    Education and routine counseling performed. Handouts provided.    Meds ordered this encounter  Medications  . ranitidine (ZANTAC) 75 MG tablet    Sig: Take 2 tablets (150 mg total) by mouth 2 (two) times daily.    Dispense:  180 tablet    Refill:  1    Medications Discontinued During This Encounter  Medication Reason  . UNABLE TO FIND   . predniSONE (STERAPRED UNI-PAK 48 TAB) 5 MG (48) TBPK tablet Completed Course  . famotidine (PEPCID) 20 MG tablet    -Patient is also not taking prednisone or Levaquin any longer.     The patient was counseled, risk factors were discussed, anticipatory guidance given.  Gross side effects, risk and benefits, and alternatives of medications discussed with patient.  Patient is aware that all medications have potential side effects and we are unable to predict every side effect or drug-drug interaction that may occur.  Expresses verbal understanding and consents to current therapy plan and treatment regimen.  Return for 76mo or so for medicare wellness and reck TSH, T4.  Please see AVS handed out to patient at the end of our visit for further patient instructions/ counseling done pertaining to today's office visit.    Note:  This document was prepared using Dragon voice recognition software and may include unintentional dictation errors.     Subjective:    HPI: Kimberly Merritt is a 78 y.o. female who presents to [place] at [facility] today for follow up of  CHRONIC CONDITIONS.     Patient has concerns about her allergies and chronic sinus pressure.  She has seen Dr. Charleston Poot who is her allergist and had multiple testing.  She has a cat at home who she has known allergies to and has tried to get red of her twice but was emotionally unable to.  She understands these are symptoms that may not be able to can be controlled by medications with that constant exposure  Patient feels her reflux is worse.  She had  to change to famotidine and she takes 20 mg twice daily.  She also does eat and drink late at night.  Often taking water to bed and sips on it throughout the evening. Describes symptoms as a heartburn and almost a metallic icky taste in her mouth in the mornings.  For patient's prediabetes: She has not been working on diet per se.  She has no complaints..  Last A1c was well controlled and results are below Lab Results  Component Value Date   HGBA1C 5.9 (H) 08/16/2017   HGBA1C 5.7 (H) 04/07/2017     HTN:  -  Her blood pressure has been controlled; patient without symptoms.  - Patient reports good compliance with blood pressure medications as directed by her cardiologist.  - Denies medication S-E   - Smoking Status noted   - She denies new onset of: chest pain, exercise intolerance, shortness of breath, dizziness, visual changes, headache, lower extremity swelling or claudication.    Last 3 blood pressure readings in our office are as follows: BP Readings from Last 3 Encounters:  03/09/18 (!) 112/59  02/14/18 127/75  12/12/17 (!) 152/83    Pulse Readings from Last 3 Encounters:  03/09/18 63  02/14/18 83  12/12/17 72    Filed Weights   03/09/18 1042  Weight: 126 lb 12.8 oz (57.5 kg)      Patient Care Team    Relationship Specialty Notifications Start End  Mellody Dance, DO PCP - General Family Medicine  03/29/17   Melrose Nakayama, MD Attending Physician Orthopedic Surgery  05/28/11   Magrinat, Virgie Dad, MD  Hematology and Oncology  05/28/11   Clarene Essex, MD  Gastroenterology  05/28/11   Rutherford Guys, MD Attending Physician Ophthalmology  05/28/11   Jovita Gamma, MD Consulting Physician Neurosurgery  08/21/13   Minus Breeding, MD Consulting Physician Cardiology  11/29/13   Levy Sjogren, MD Referring Physician Dermatology  04/05/17   Irine Seal, MD Attending Physician Urology  04/05/17   Mosetta Anis, MD Referring Physician Allergy  04/05/17   Elam Dutch, MD Consulting Physician Vascular Surgery  02/02/18      Lab Results  Component Value Date   CREATININE 0.83 11/24/2017   BUN 17 11/24/2017   NA 138 11/24/2017   K 4.7 11/24/2017   CL 101 11/24/2017   CO2 28 11/24/2017    Lab Results  Component Value Date   CHOL 174 10/07/2017   CHOL 171 06/08/2011   CHOL 197 02/24/2010    Lab Results  Component Value Date   HDL 61 10/07/2017   HDL 70.90 06/08/2011   HDL 69.30 02/24/2010    Lab Results  Component Value Date   LDLCALC 104 (H) 10/07/2017   LDLCALC 94 06/08/2011   LDLCALC 116 (H) 02/24/2010    Lab Results  Component Value Date   TRIG 43 10/07/2017   TRIG 32.0 06/08/2011   TRIG 60.0 02/24/2010    Lab Results  Component  Value Date   CHOLHDL 2.9 10/07/2017   CHOLHDL 2 06/08/2011   CHOLHDL 3 02/24/2010    Lab Results  Component Value Date   LDLDIRECT 131.0 02/05/2008   ===================================================================   Patient Active Problem List   Diagnosis Date Noted  . Hypertension 04/05/2017    Priority: High  . Benign colonic polyp- found in 07/2011- told f/up Dr Watt Climes 5 yrs 04/05/2017    Priority: Medium  . 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 Nonischemic cardiomyopathy ([place]) 11/29/2013    Priority: Medium  . MONOCLONAL GAMMOPATHY 03/03/2010    Priority: Medium  . Herpes labialis 12/01/2009    Priority: Medium  . Gastroesophageal reflux disease 08/29/2006    Priority: Medium  . 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 Environmental and seasonal allergies 04/05/2017    Priority: Low  . History of hypoglycemia 04/05/2017    Priority: Low  . Post-menopausal 04/05/2017    Priority: Low  . Hypothyroidism (acquired) 08/29/2006    Priority: Low  . Generalized osteoarthritis 08/29/2006    Priority: Low  . Hyperlipidemia 03/09/2018  . Immunodeficiency ([place]) 12/01/2017  . Chronic pain of right  knee-since age 31 per patient 04/12/2017  . Prediabetes 04/12/2017  . Vitamin D insufficiency 04/12/2017  . CAD (coronary artery disease) 04/05/2017  . Atherosclerosis of aorta ([place]) 04/05/2017  . 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 History of colonic polyps 04/05/2017  . Scoliosis 04/05/2017  . Ventricular premature depolarization 04/05/2017  . PVC's (premature ventricular contractions) 05/18/2016  . Chronic cough 10/23/2014  . Bradycardia 09/25/2013  . Lip lesion 08/21/2013  . Diverticulosis of colon without hemorrhage 05/02/2013  . Paresthesia 06/20/2012  . Well adult exam 08/31/2011  . Cough 08/10/2011  . Sinusitis, acute frontal 02/15/2011  . Shoulder pain, right 07/09/2010  . LIVER HEMANGIOMA 03/03/2010  . MUSCLE SPASM 03/03/2010  . Disorder of bone and cartilage 11/19/2008  . HEPATIC CYST 08/02/2007  . ALLERGIC RHINITIS 04/25/2007  . HOARSENESS 04/25/2007  . 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 LOW BACK PAIN 08/29/2006  . Pain in Soft Tissues of Limb 08/29/2006     Past Medical History:  Diagnosis Date  . Allergy   . CAD (coronary artery disease)    Nonobstructive. LAD 30% stenosis, small PDA of the circumflex 70% stenosis, right coronary artery 40% stenosis. Catheterization September 2015  . DEPRESSION 08/29/2006  . Diverticulosis   . GERD 08/29/2006  . HEPATIC CYST 08/02/2007  . Hypertension   . HYPOTHYROIDISM 08/29/2006  . LIVER HEMANGIOMA 03/03/2010  . LOW BACK PAIN 08/29/2006  . 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 MUSCLE SPASM 03/03/2010  . Nonischemic cardiomyopathy ([facility])    EF 30%  . OSTEOARTHRITIS 08/29/2006  . OSTEOPENIA 11/19/2008  . PARESTHESIA 03/05/2009  . PVC's (premature ventricular contractions)   . Scoliosis   . VARICOSE VEINS, LOWER EXTREMITIES 08/29/2006     Past Surgical History:  Procedure Laterality Date  . ABDOMINAL HYSTERECTOMY  1981  . APPENDECTOMY    . BACK SURGERY  2010,2006   lumb fusion  . BUNIONECTOMY     both  . BUNIONECTOMY  Right 1985  . CARPAL TUNNEL RELEASE     both  . CARPAL TUNNEL RELEASE Right 1992  . CARPAL TUNNEL RELEASE Left 1992  . CATARACT EXTRACTION     both  . CATARACT EXTRACTION Right 2008  . CATARACT EXTRACTION Left 2008  . HAMMER TOE SURGERY Left 1987  .  HAMMER TOE SURGERY Right 1986  . LEFT AND RIGHT HEART CATHETERIZATION WITH CORONARY ANGIOGRAM N/A 10/04/2013   Procedure: LEFT AND RIGHT HEART CATHETERIZATION WITH CORONARY ANGIOGRAM;  Surgeon: Leonie Man, MD;  Location[facility] CATH LAB;  Service: Cardiovascular;  Laterality: N/A;  . LUMBAR FUSION  2006  . LUMBAR FUSION  2010  . OOPHORECTOMY  1967  . OVARIAN CYST REMOVAL    . ROTATOR CUFF REPAIR Right 2013  . SHOULDER ARTHROSCOPY  02/09/2011   Procedure: ARTHROSCOPY SHOULDER;  Surgeon: Hessie Dibble, MD;  Location: [place];  Service: Orthopedics;  Laterality: Right;  right shoulder arthroscopy subacromial decompression with rotator cuff repair  . TONSILLECTOMY       Family History  Problem Relation Age of Onset  . Stroke Mother   . Cancer Father   . Emphysema Father   . Hypertension Other   . Breast cancer Neg Hx      Social History   Substance and Sexual Activity  Drug Use No  ,  Social History   Substance and Sexual Activity  Alcohol Use No  . Alcohol/week: 0.0 standard drinks  ,  Social History   Tobacco Use  Smoking Status Never Smoker  Smokeless Tobacco Never Used  ,    Current Outpatient Medications on File Prior to Visit  Medication Sig Dispense Refill  . acyclovir (ZOVIRAX) 400 MG tablet TAKE 1 TABLET (400 MG TOTAL) BY MOUTH 2 TIMES DAILY. 60 tablet 4  . carvedilol (COREG) 12.5 MG tablet Take 1 tablet (12.5 mg total) by mouth 2 (two) times daily. 180 tablet 3  . Cholecalciferol (VITAMIN D3) 50 MCG (2000 UT) capsule Take 1 capsule by mouth daily.     . cromolyn (NASALCROM) 5.2 MG/ACT nasal spray Place 1 spray into both nostrils 4 (four) times daily.    Marland Kitchen EPINEPHrine 0.3 mg/0.3 mL IJ SOAJ  injection   1  . glucosamine-chondroitin 500-400 MG tablet Take 1 tablet by mouth 2 (two) times daily.     Marland Kitchen loratadine (CLARITIN) 10 MG tablet Take 10 mg by mouth daily.    Marland Kitchen losartan (COZAAR) 25 MG tablet TAKE 1 TABLET BY MOUTH DAILY. 90 tablet 1  . NON FORMULARY Allergy injections     . Polyethyl Glycol-Propyl Glycol (SYSTANE ULTRA) 0.4-0.3 % SOLN Apply to eye. 2 drops in each eye 3 to 4 times daily    . SALINE NASAL SPRAY NA Place into the nose. 2 sprays each nostril every 4 hours    . sucralfate (CARAFATE) 1 g tablet Take 1 g by mouth daily.    Marland Kitchen SYNTHROID 25 MCG tablet TAKE 1 TABLET BY MOUTH DAILY BEFORE BREAKFAST. 90 tablet 1  . levofloxacin (LEVAQUIN) 500 MG tablet Take 1 tablet (500 mg total) by mouth daily. 5 tablet 0   No current facility-administered medications on file prior to visit.      Allergies  Allergen Reactions  . Acetaminophen Rash    REACTION: high dose - tremor Per pt - rash on chest and heart palpitations  . Alendronate Sodium     Stomach upset.   . Codeine     May interfere with heart condition    . Contrast Media [Iodinated Diagnostic Agents]     Syncope.   . Diflunisal     Stomach pain.    . Doxycycline     n/v  . Flexeril [Cyclobenzaprine]     Abdominal pain.    . Nabumetone     Abdominal pain;  dark, tarry stool.    . Oxybutynin     Pt does not remember.    . Raloxifene     Unknown.    . Sulfa Antibiotics Nausea Only    Syncope.    . Symbicort [Budesonide-Formoterol Fumarate]     "Jittery-ness."   . Tramadol Hcl     Numbness.    . Diclofenac Palpitations    Dark, tarry stool.    . Latex Rash     Review of Systems:   General:  Denies fever, chills Optho/Auditory:   Denies visual changes, blurred vision Respiratory:   Denies SOB, cough, wheeze, DIB  Cardiovascular:   Denies chest pain, palpitations, painful respirations Gastrointestinal:   Denies nausea, vomiting, diarrhea.  Endocrine:     Denies new hot or cold  intolerance Musculoskeletal:  Denies joint swelling, gait issues, or new unexplained myalgias/ arthralgias Skin:  Denies rash, suspicious lesions  Neurological:    Denies dizziness, unexplained weakness, numbness  Psychiatric/Behavioral:   Denies mood changes  Objective:    Blood pressure (!) 112/59, pulse 63, temperature 97.9 F (36.6 C), height 5\' 3"  (1.6 m), weight 126 lb 12.8 oz (57.5 kg), SpO2 100 %.  Body mass index is 22.46 kg/m.  General: Well Developed, well nourished, and in no acute distress.  HEENT: Normocephalic, atraumatic, pupils equal round reactive to light, neck supple, No carotid bruits, no JVD Skin: Warm and dry, cap RF less 2 sec Cardiac: Regular rate and rhythm, S1, S2 WNL's, no murmurs rubs or gallops Respiratory: ECTA B/L, Not using accessory muscles, speaking in full sentences. NeuroM-Sk: Ambulates w/o assistance, moves ext * 4 w/o difficulty, sensation grossly intact.  Ext: scant edema b/l lower ext Psych: No HI/SI, judgement and insight good, Euthymic mood. Full Affect.

## 2018-03-09 NOTE — Patient Instructions (Signed)
Risk factors for prediabetes and type 2 diabetes  Researchers don't fully understand why some people develop prediabetes and type 2 diabetes and others don't.  It's clear that certain factors increase the risk, however, including:  Weight. The more fatty tissue you have, the more resistant your cells become to insulin.  Inactivity. The less active you are, the greater your risk. Physical activity helps you control your weight, uses up glucose as energy and makes your cells more sensitive to insulin.  Family history. Your risk increases if a parent or sibling has type 2 diabetes.  Race. Although it's unclear why, people of certain races - including blacks, Hispanics, American Indians and Asian-Americans - are at higher risk.  Age. Your risk increases as you get older. This may be because you tend to exercise less, lose muscle mass and gain weight as you age. But type 2 diabetes is also increasing dramatically among children, adolescents and younger adults.  Gestational diabetes. If you developed gestational diabetes when you were pregnant, your risk of developing prediabetes and type 2 diabetes later increases. If you gave birth to a baby weighing more than 9 pounds (4 kilograms), you're also at risk of type 2 diabetes.  Polycystic ovary syndrome. For women, having polycystic ovary syndrome - a common condition characterized by irregular menstrual periods, excess hair growth and obesity - increases the risk of diabetes.  High blood pressure. Having blood pressure over 140/90 millimeters of mercury (mm Hg) is linked to an increased risk of type 2 diabetes.  Abnormal cholesterol and triglyceride levels. If you have low levels of high-density lipoprotein (HDL), or "good," cholesterol, your risk of type 2 diabetes is higher. Triglycerides are another type of fat carried in the blood. People with high levels of triglycerides have an increased risk of type 2 diabetes. Your doctor can let you know what your  cholesterol and triglyceride levels are.  A good guide to good carbs: The glycemic index ---If you have diabetes, or at risk for diabetes, you know all too well that when you eat carbohydrates, your blood sugar goes up. The total amount of carbs you consume at a meal or in a snack mostly determines what your blood sugar will do. But the food itself also plays a role. A serving of white rice has almost the same effect as eating pure table sugar - a quick, high spike in blood sugar. A serving of lentils has a slower, smaller effect.  ---Picking good sources of carbs can help you control your blood sugar and your weight. Even if you don't have diabetes, eating healthier carbohydrate-rich foods can help ward off a host of chronic conditions, from heart disease to various cancers to, well, diabetes.  ---One way to choose foods is with the glycemic index (GI). This tool measures how much a food boosts blood sugar.  The glycemic index rates the effect of a specific amount of a food on blood sugar compared with the same amount of pure glucose. A food with a glycemic index of 28 boosts blood sugar only 28% as much as pure glucose. One with a GI of 95 acts like pure glucose.    High glycemic foods result in a quick spike in insulin and blood sugar (also known as blood glucose).  Low glycemic foods have a slower, smaller effect- these are healthier for you.   Using the glycemic index Using the glycemic index is easy: choose foods in the low GI category instead of those in the high  GI category (see below), and go easy on those in between. Low glycemic index (GI of 55 or less): Most fruits and vegetables, beans, minimally processed grains, pasta, low-fat dairy foods, and nuts.  Moderate glycemic index (GI 56 to 69): White and sweet potatoes, corn, white rice, couscous, breakfast cereals such as Cream of Wheat and Mini Wheats.  High glycemic index (GI of 70 or higher): White bread, rice cakes, most crackers,  bagels, cakes, doughnuts, croissants, most packaged breakfast cereals. You can see the values for 100 commons foods and get links to more at www.health.CheapToothpicks.si.  Swaps for lowering glycemic index  Instead of this high-glycemic index food Eat this lower-glycemic index food  White rice Brown rice or converted rice  Instant oatmeal Steel-cut oats  Cornflakes Bran flakes  Baked potato Pasta, bulgur  White bread Whole-grain bread  Corn Peas or leafy greens       Prediabetes Eating Plan  Prediabetes--also called impaired glucose tolerance or impaired fasting glucose--is a condition that causes blood sugar (blood glucose) levels to be higher than normal. Following a healthy diet can help to keep prediabetes under control. It can also help to lower the risk of type 2 diabetes and heart disease, which are increased in people who have prediabetes. Along with regular exercise, a healthy diet:  Promotes weight loss.  Helps to control blood sugar levels.  Helps to improve the way that the body uses insulin.   WHAT DO I NEED TO KNOW ABOUT THIS EATING PLAN?   Use the glycemic index (GI) to plan your meals. The index tells you how quickly a food will raise your blood sugar. Choose low-GI foods. These foods take a longer time to raise blood sugar.  Pay close attention to the amount of carbohydrates in the food that you eat. Carbohydrates increase blood sugar levels.  Keep track of how many calories you take in. Eating the right amount of calories will help you to achieve a healthy weight. Losing about 7 percent of your starting weight can help to prevent type 2 diabetes.  You may want to follow a Mediterranean diet. This diet includes a lot of vegetables, lean meats or fish, whole grains, fruits, and healthy oils and fats.   WHAT FOODS CAN I EAT?  Grains Whole grains, such as whole-wheat or whole-grain breads, crackers, cereals, and pasta. Unsweetened oatmeal. Bulgur. Barley.  Quinoa. Brown rice. Corn or whole-wheat flour tortillas or taco shells. Vegetables Lettuce. Spinach. Peas. Beets. Cauliflower. Cabbage. Broccoli. Carrots. Tomatoes. Squash. Eggplant. Herbs. Peppers. Onions. Cucumbers. Brussels sprouts. Fruits Berries. Bananas. Apples. Oranges. Grapes. Papaya. Mango. Pomegranate. Kiwi. Grapefruit. Cherries. Meats and Other Protein Sources Seafood. Lean meats, such as chicken and [place] or lean cuts of pork and beef. Tofu. Eggs. Nuts. Beans. Dairy Low-fat or fat-free dairy products, such as yogurt, cottage cheese, and cheese. Beverages Water. Tea. Coffee. Sugar-free or diet soda. Seltzer water. Milk. Milk alternatives, such as soy or almond milk. Condiments Mustard. Relish. Low-fat, low-sugar ketchup. Low-fat, low-sugar barbecue sauce. Low-fat or fat-free mayonnaise. Sweets and Desserts Sugar-free or low-fat pudding. Sugar-free or low-fat ice cream and other frozen treats. Fats and Oils Avocado. Walnuts. Olive oil. The items listed above may not be a complete list of recommended foods or beverages. Contact your dietitian for more options.    WHAT FOODS ARE NOT RECOMMENDED?  Grains Refined white flour and flour products, such as bread, pasta, snack foods, and cereals. Beverages Sweetened drinks, such as sweet iced tea and soda. Sweets and Desserts  Baked goods, such as cake, cupcakes, pastries, cookies, and cheesecake. The items listed above may not be a complete list of foods and beverages to avoid. Contact your dietitian for more information.   This information is not intended to replace advice given to you by your health care provider. Make sure you discuss any questions you have with your health care provider.   Document Released: 06/04/2014 Document Reviewed: 06/04/2014 Elsevier Interactive Patient Education 2016 [place].       Gastroesophageal Reflux Disease, Adult Normally, food travels down the esophagus and stays in the stomach to  be digested. However, when a person has gastroesophageal reflux disease (GERD), food and stomach acid move back up into the esophagus. When this happens, the esophagus becomes sore and inflamed. Over time, GERD can create small holes (ulcers) in the lining of the esophagus. What are the causes? This condition is caused by a problem with the muscle between the esophagus and the stomach (lower esophageal sphincter, or LES). Normally, the LES muscle closes after food passes through the esophagus to the stomach. When the LES is weakened or abnormal, it does not close properly, and that allows food and stomach acid to go back up into the esophagus. The LES can be weakened by certain dietary substances, medicines, and medical conditions, including:  Tobacco use.  Pregnancy.  Having a hiatal hernia.  Heavy alcohol use.  Certain foods and beverages, such as coffee, chocolate, onions, and peppermint.  What increases the risk? This condition is more likely to develop in:  People who have an increased body weight.  People who have connective tissue disorders.  People who use NSAID medicines.  What are the signs or symptoms? Symptoms of this condition include:  Heartburn.  Difficult or painful swallowing.  The feeling of having a lump in the throat.  A bitter taste in the mouth.  Bad breath.  Having a large amount of saliva.  Having an upset or bloated stomach.  Belching.  Chest pain.  Shortness of breath or wheezing.  Ongoing (chronic) cough or a night-time cough.  Wearing away of tooth enamel.  Weight loss.  Different conditions can cause chest pain. Make sure to see your health care provider if you experience chest pain. How is this diagnosed? Your health care provider will take a medical history and perform a physical exam. To determine if you have mild or severe GERD, your health care provider may also monitor how you respond to treatment. You may also have other tests,  including:  An endoscopy to examine your stomach and esophagus with a small camera.  A test that measures the acidity level in your esophagus.  A test that measures how much pressure is on your esophagus.  A barium swallow or modified barium swallow to show the shape, size, and functioning of your esophagus.  How is this treated? The goal of treatment is to help relieve your symptoms and to prevent complications. Treatment for this condition may vary depending on how severe your symptoms are. Your health care provider may recommend:  Changes to your diet.  Medicine.  Surgery.  Follow these instructions at home: Diet  Follow a diet as recommended by your health care provider. This may involve avoiding foods and drinks such as: ? Coffee and tea (with or without caffeine). ? Drinks that contain alcohol. ? Energy drinks and sports drinks. ? Carbonated drinks or sodas. ? Chocolate and cocoa. ? Peppermint and mint flavorings. ? Garlic and onions. ? Horseradish. ?  Spicy and acidic foods, including peppers, chili powder, curry powder, vinegar, hot sauces, and barbecue sauce. ? Citrus fruit juices and citrus fruits, such as oranges, lemons, and limes. ? Tomato-based foods, such as red sauce, chili, salsa, and pizza with red sauce. ? Fried and fatty foods, such as donuts, french fries, potato chips, and high-fat dressings. ? High-fat meats, such as hot dogs and fatty cuts of red and white meats, such as rib eye steak, sausage, ham, and bacon. ? High-fat dairy items, such as whole milk, butter, and cream cheese.  Eat small, frequent meals instead of large meals.  Avoid drinking large amounts of liquid with your meals.  Avoid eating meals during the 2-3 hours before bedtime.  Avoid lying down right after you eat.  Do not exercise right after you eat. General instructions  Pay attention to any changes in your symptoms.  Take over-the-counter and prescription medicines only as  told by your health care provider. Do not take aspirin, ibuprofen, or other NSAIDs unless your health care provider told you to do so.  Do not use any tobacco products, including cigarettes, chewing tobacco, and e-cigarettes. If you need help quitting, ask your health care provider.  Wear loose-fitting clothing. Do not wear anything tight around your waist that causes pressure on your abdomen.  Raise (elevate) the head of your bed 6 inches (15cm).  Try to reduce your stress, such as with yoga or meditation. If you need help reducing stress, ask your health care provider.  If you are overweight, reduce your weight to an amount that is healthy for you. Ask your health care provider for guidance about a safe weight loss goal.  Keep all follow-up visits as told by your health care provider. This is important. Contact a health care provider if:  You have new symptoms.  You have unexplained weight loss.  You have difficulty swallowing, or it hurts to swallow.  You have wheezing or a persistent cough.  Your symptoms do not improve with treatment.  You have a hoarse voice. Get help right away if:  You have pain in your arms, neck, jaw, teeth, or back.  You feel sweaty, dizzy, or light-headed.  You have chest pain or shortness of breath.  You vomit and your vomit looks like blood or coffee grounds.  You faint.  Your stool is bloody or black.  You cannot swallow, drink, or eat. This information is not intended to replace advice given to you by your health care provider. Make sure you discuss any questions you have with your health care provider. Document Released: 10/28/2004 Document Revised: 06/18/2015 Document Reviewed: 05/15/2014 Elsevier Interactive Patient Education  2018 [place] for Gastroesophageal Reflux Disease, Adult When you have gastroesophageal reflux disease (GERD), the foods you eat and your eating habits are very important. Choosing the  right foods can help ease the discomfort of GERD. Consider working with a diet and nutrition specialist (dietitian) to help you make healthy food choices. What general guidelines should I follow? Eating plan  Choose healthy foods low in fat, such as fruits, vegetables, whole grains, low-fat dairy products, and lean meat, fish, and poultry.  Eat frequent, small meals instead of three large meals each day. Eat your meals slowly, in a relaxed setting. Avoid bending over or lying down until 2-3 hours after eating.  Limit high-fat foods such as fatty meats or fried foods.  Limit your intake of oils, butter, and shortening to less than  8 teaspoons each day.  Avoid the following: ? Foods that cause symptoms. These may be different for different people. Keep a food diary to keep track of foods that cause symptoms. ? Alcohol. ? Drinking large amounts of liquid with meals. ? Eating meals during the 2-3 hours before bed.  Cook foods using methods other than frying. This may include baking, grilling, or broiling. Lifestyle   Maintain a healthy weight. Ask your health care provider what weight is healthy for you. If you need to lose weight, work with your health care provider to do so safely.  Exercise for at least 30 minutes on 5 or more days each week, or as told by your health care provider.  Avoid wearing clothes that fit tightly around your waist and chest.  Do not use any products that contain nicotine or tobacco, such as cigarettes and e-cigarettes. If you need help quitting, ask your health care provider.  Sleep with the head of your bed raised. Use a wedge under the mattress or blocks under the bed frame to raise the head of the bed. What foods are not recommended? The items listed may not be a complete list. Talk with your dietitian about what dietary choices are best for you. Grains Pastries or quick breads with added fat. [place] toast. Vegetables Deep fried vegetables. [place]  fries. Any vegetables prepared with added fat. Any vegetables that cause symptoms. For some people this may include tomatoes and tomato products, chili peppers, onions and garlic, and horseradish. Fruits Any fruits prepared with added fat. Any fruits that cause symptoms. For some people this may include citrus fruits, such as oranges, grapefruit, pineapple, and lemons. Meats and other protein foods High-fat meats, such as fatty beef or pork, hot dogs, ribs, ham, sausage, salami and bacon. Fried meat or protein, including fried fish and fried chicken. Nuts and nut butters. Dairy Whole milk and chocolate milk. Sour cream. Cream. Ice cream. Cream cheese. Milk shakes. Beverages Coffee and tea, with or without caffeine. Carbonated beverages. Sodas. Energy drinks. Fruit juice made with acidic fruits (such as orange or grapefruit). Tomato juice. Alcoholic drinks. Fats and oils Butter. Margarine. Shortening. Ghee. Sweets and desserts Chocolate and cocoa. Donuts. Seasoning and other foods Pepper. Peppermint and spearmint. Any condiments, herbs, or seasonings that cause symptoms. For some people, this may include curry, hot sauce, or vinegar-based salad dressings. Summary  When you have gastroesophageal reflux disease (GERD), food and lifestyle choices are very important to help ease the discomfort of GERD.  Eat frequent, small meals instead of three large meals each day. Eat your meals slowly, in a relaxed setting. Avoid bending over or lying down until 2-3 hours after eating.  Limit high-fat foods such as fatty meat or fried foods. This information is not intended to replace advice given to you by your health care provider. Make sure you discuss any questions you have with your health care provider. Document Released: 01/18/2005 Document Revised: 01/20/2016 Document Reviewed: 01/20/2016 Elsevier Interactive Patient Education  [employer].

## 2018-03-13 DIAGNOSIS — J301 Allergic rhinitis due to pollen: Secondary | ICD-10-CM | POA: Diagnosis not present

## 2018-03-13 DIAGNOSIS — J3081 Allergic rhinitis due to animal (cat) (dog) hair and dander: Secondary | ICD-10-CM | POA: Diagnosis not present

## 2018-03-13 DIAGNOSIS — J3089 Other allergic rhinitis: Secondary | ICD-10-CM | POA: Diagnosis not present

## 2018-03-27 DIAGNOSIS — J3089 Other allergic rhinitis: Secondary | ICD-10-CM | POA: Diagnosis not present

## 2018-03-27 DIAGNOSIS — J301 Allergic rhinitis due to pollen: Secondary | ICD-10-CM | POA: Diagnosis not present

## 2018-03-27 DIAGNOSIS — J3081 Allergic rhinitis due to animal (cat) (dog) hair and dander: Secondary | ICD-10-CM | POA: Diagnosis not present

## 2018-03-29 DIAGNOSIS — J3081 Allergic rhinitis due to animal (cat) (dog) hair and dander: Secondary | ICD-10-CM | POA: Diagnosis not present

## 2018-03-29 DIAGNOSIS — J301 Allergic rhinitis due to pollen: Secondary | ICD-10-CM | POA: Diagnosis not present

## 2018-03-29 DIAGNOSIS — J3089 Other allergic rhinitis: Secondary | ICD-10-CM | POA: Diagnosis not present

## 2018-04-10 DIAGNOSIS — J3089 Other allergic rhinitis: Secondary | ICD-10-CM | POA: Diagnosis not present

## 2018-04-10 DIAGNOSIS — J301 Allergic rhinitis due to pollen: Secondary | ICD-10-CM | POA: Diagnosis not present

## 2018-04-10 DIAGNOSIS — J3081 Allergic rhinitis due to animal (cat) (dog) hair and dander: Secondary | ICD-10-CM | POA: Diagnosis not present

## 2018-04-27 ENCOUNTER — Other Ambulatory Visit: Payer: Self-pay | Admitting: Family Medicine

## 2018-04-27 ENCOUNTER — Other Ambulatory Visit: Payer: Self-pay | Admitting: Cardiology

## 2018-04-27 DIAGNOSIS — E039 Hypothyroidism, unspecified: Secondary | ICD-10-CM

## 2018-04-28 DIAGNOSIS — J301 Allergic rhinitis due to pollen: Secondary | ICD-10-CM | POA: Diagnosis not present

## 2018-04-28 DIAGNOSIS — J3089 Other allergic rhinitis: Secondary | ICD-10-CM | POA: Diagnosis not present

## 2018-04-28 DIAGNOSIS — J3081 Allergic rhinitis due to animal (cat) (dog) hair and dander: Secondary | ICD-10-CM | POA: Diagnosis not present

## 2018-05-10 ENCOUNTER — Ambulatory Visit: Payer: Medicare Other | Admitting: Family Medicine

## 2018-05-22 ENCOUNTER — Other Ambulatory Visit: Payer: Self-pay

## 2018-05-22 ENCOUNTER — Ambulatory Visit (INDEPENDENT_AMBULATORY_CARE_PROVIDER_SITE_OTHER): Payer: Medicare Other | Admitting: Family Medicine

## 2018-05-22 ENCOUNTER — Encounter: Payer: Self-pay | Admitting: Family Medicine

## 2018-05-22 VITALS — BP 117/65 | HR 62 | Temp 96.3°F | Ht 63.0 in | Wt 123.0 lb

## 2018-05-22 DIAGNOSIS — J029 Acute pharyngitis, unspecified: Secondary | ICD-10-CM

## 2018-05-22 DIAGNOSIS — J3089 Other allergic rhinitis: Secondary | ICD-10-CM | POA: Diagnosis not present

## 2018-05-22 DIAGNOSIS — Z516 Encounter for desensitization to allergens: Secondary | ICD-10-CM | POA: Diagnosis not present

## 2018-05-22 DIAGNOSIS — Z7189 Other specified counseling: Secondary | ICD-10-CM

## 2018-05-22 DIAGNOSIS — R05 Cough: Secondary | ICD-10-CM

## 2018-05-22 DIAGNOSIS — K219 Gastro-esophageal reflux disease without esophagitis: Secondary | ICD-10-CM

## 2018-05-22 DIAGNOSIS — R058 Other specified cough: Secondary | ICD-10-CM

## 2018-05-22 MED ORDER — CIMETIDINE 400 MG PO TABS: 400.0000 mg | ORAL_TABLET | Freq: Two times a day (BID) | ORAL | 0 refills | Status: DC

## 2018-05-22 MED ORDER — MONTELUKAST SODIUM 10 MG PO TABS: 10.0000 mg | ORAL_TABLET | Freq: Every day | ORAL | 1 refills | Status: DC

## 2018-05-22 NOTE — Progress Notes (Signed)
Virtual Visit via Telephone/ Video Note for Mellody Dance, D.O- Primary Care Physician at [facility]   I connected with current patient today by telephone/ video and verified that I am speaking with the correct person using two identifiers.    This visit type was conducted due to national recommendations for restrictions regarding the COVID-19 Pandemic (e.g. social distancing) in an effort to limit this patient's exposure and mitigate transmission in our community.  Due to her co-morbid illnesses, this patient is at least at moderate risk for complications without adequate follow up.  This format is felt to be most appropriate for this patient at this time.  The patient did not have access to video technology/had technical difficulties with video requiring transitioning to audio format only (telephone).  No physical exam could be performed with this format, beyond that communicated to [place] by the patient/ family members as noted.  Additionally my staff members also discussed with the patient that there may be a patient charge related to this service.   The patient expressed understanding, and agreed to proceed.      History of Present Illness:   Friday- she was outside all afternoon- for her birthday.  Sx started late Sat afternoon- 5 or 6pm- started out as bad S.T.- painful to swallow- gargling, using vaporizer.  Then coughing started SUNday- dry.  RN and eyes running all Sunday.   Forehead felt warm- one time on Saturday- but NO fevers/ temps noted.  Sun AM- no longer w feeling warm at all.   NO SOB/ DIB. no one-sided face pain.  Patient states this feels just like her typical seasonal allergies that she gets every year.  But admits she was a little worried about COVID so wanted to check with me about these sx  -Pt taking nasalcrom, claritin D daily.  Doing daily sinus rinses- BID.   Stopped allergy shots mid-to-late march due to [place] virus as she did not want to go into the doctor's  office.    GERD: Received note from her pharmacy that ranitidine removed from market.  Patient is wondering what she should take instead.  She does not smoke.  Does not eat particularly late at night or large amounts.  She tries to avoid spicy foods.  Drinking caffeine/ coffee.   Famotidine did not work for her.  She did not know what else to use   Wt Readings from Last 3 Encounters:  05/30/18 123 lb (55.8 kg)  05/22/18 123 lb (55.8 kg)  03/09/18 126 lb 12.8 oz (57.5 kg)    BP Readings from Last 3 Encounters:  05/30/18 119/66  05/22/18 117/65  03/09/18 (!) 112/59    Pulse Readings from Last 3 Encounters:  05/30/18 63  05/22/18 62  03/09/18 63    BMI Readings from Last 3 Encounters:  05/30/18 21.79 kg/m  05/22/18 21.79 kg/m  03/09/18 22.46 kg/m      -Vitals obtained; Medications, allergies reconciled;  personal medical, social, Sx etc. etc. histories were updated by Lanier Prude the medical assistant today and are reflected in below chart   Patient Care Team    Relationship Specialty Notifications Start End  Mellody Dance, DO PCP - General Family Medicine  03/29/17   Melrose Nakayama, MD Attending Physician Orthopedic Surgery  05/28/11   Magrinat, Virgie Dad, MD  Hematology and Oncology  05/28/11   Clarene Essex, MD  Gastroenterology  05/28/11   Rutherford Guys, MD Attending Physician Ophthalmology  05/28/11  Jovita Gamma, MD Consulting Physician Neurosurgery  08/21/13   Minus Breeding, MD Consulting Physician Cardiology  11/29/13   Levy Sjogren, MD Referring Physician Dermatology  04/05/17   Irine Seal, MD Attending Physician Urology  04/05/17   Mosetta Anis, MD Referring Physician Allergy  04/05/17   Elam Dutch, MD Consulting Physician Vascular Surgery  02/02/18      Patient Active Problem List   Diagnosis Date Noted  . Hypertension 04/05/2017    Priority: High  . Benign colonic polyp- found in 07/2011- told f/up Dr Watt Climes 5 yrs 04/05/2017     Priority: Medium  . 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 Nonischemic cardiomyopathy ([place]) 11/29/2013    Priority: Medium  . MONOCLONAL GAMMOPATHY 03/03/2010    Priority: Medium  . Herpes labialis 12/01/2009    Priority: Medium  . Gastroesophageal reflux disease 08/29/2006    Priority: Medium  . 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 Environmental and seasonal allergies 04/05/2017    Priority: Low  . History of hypoglycemia 04/05/2017    Priority: Low  . Post-menopausal 04/05/2017    Priority: Low  . Hypothyroidism (acquired) 08/29/2006    Priority: Low  . Generalized osteoarthritis 08/29/2006    Priority: Low  . Hyperlipidemia 03/09/2018  . 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 Chronic pain of right knee-since age 74 per patient 04/12/2017  . Prediabetes 04/12/2017  . Vitamin D insufficiency 04/12/2017  . CAD (coronary artery disease) 04/05/2017  . Atherosclerosis of aorta ([place]) 04/05/2017  . 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 History of colonic polyps 04/05/2017  . Scoliosis 04/05/2017  . Ventricular premature depolarization 04/05/2017  . PVC's (premature ventricular contractions) 05/18/2016  . Chronic cough 10/23/2014  . Bradycardia 09/25/2013  . Lip lesion 08/21/2013  . Diverticulosis of colon without hemorrhage 05/02/2013  . Paresthesia 06/20/2012  . Well adult exam 08/31/2011  . Cough 08/10/2011  . Sinusitis, acute frontal 02/15/2011  . Shoulder pain, right 07/09/2010  . LIVER HEMANGIOMA 03/03/2010  . MUSCLE SPASM 03/03/2010  . Disorder of bone and cartilage 11/19/2008  . HEPATIC CYST 08/02/2007  . ALLERGIC RHINITIS 04/25/2007  . HOARSENESS 04/25/2007  . 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 LOW BACK PAIN 08/29/2006  . Pain in Soft Tissues of Limb 08/29/2006     Current Meds  Medication Sig  . carvedilol (COREG) 12.5 MG tablet TAKE 1 TABLET BY MOUTH 2 TIMES DAILY.  Marland Kitchen  Cholecalciferol (VITAMIN D3) 50 MCG (2000 UT) capsule Take 1 capsule by mouth daily.   . cromolyn (NASALCROM) 5.2 MG/ACT nasal spray Place 1 spray into both nostrils 4 (four) times daily.  Marland Kitchen EPINEPHrine 0.3 mg/0.3 mL IJ SOAJ injection   . Glucosamine-Chondroitin 1500-1200 MG/30ML LIQD Take 1 tablet by mouth daily.   Marland Kitchen loratadine (CLARITIN) 10 MG tablet Take 10 mg by mouth daily.  . NON FORMULARY Allergy injections   . Polyethyl Glycol-Propyl Glycol (SYSTANE ULTRA) 0.4-0.3 % SOLN Apply to eye. 2 drops in each eye 3 to 4 times daily  . SALINE NASAL SPRAY NA Place into the nose. 2 sprays each nostril every 4 hours  . sucralfate (CARAFATE) 1 g tablet Take 1 g by mouth daily.  Marland Kitchen SYNTHROID 25 MCG tablet TAKE 1 TABLET BY MOUTH DAILY BEFORE BREAKFAST.  . [DISCONTINUED] losartan (COZAAR) 25 MG tablet TAKE 1 TABLET BY MOUTH DAILY.  . [DISCONTINUED] ranitidine (ZANTAC) 75 MG tablet Take 2 tablets (150  mg total) by mouth 2 (two) times daily.     Allergies:  Allergies  Allergen Reactions  . Acetaminophen Rash    REACTION: high dose - tremor Per pt - rash on chest and heart palpitations  . Alendronate Sodium     Stomach upset.   . Codeine     May interfere with heart condition    . Contrast Media [Iodinated Diagnostic Agents]     Syncope.   . Diflunisal     Stomach pain.    . Doxycycline     n/v  . Flexeril [Cyclobenzaprine]     Abdominal pain.    . Nabumetone     Abdominal pain; dark, tarry stool.    . Oxybutynin     Pt does not remember.    . Raloxifene     Unknown.    . Sulfa Antibiotics Nausea Only    Syncope.    . Symbicort [Budesonide-Formoterol Fumarate]     "Jittery-ness."   . Tramadol Hcl     Numbness.    . Diclofenac Palpitations    Dark, tarry stool.    . Latex Rash     ROS:  See above HPI for pertinent positives and negatives   Objective:   Blood pressure 117/65, pulse 62, temperature (!) 96.3 F (35.7 C), height 5\' 3"  (1.6 m), weight 123 lb (55.8 kg). (if some  vitals are omitted, this means that patient was UNABLE to obtain them even though asked to get them prior to OV today) General: sounds in no acute distress.  Skin: Pt confirms warm and dry  extremities and pink fingertips Respiratory: speaking in full sentences, no conversational dyspnea Psych: A and O *3, appears insight good, mood- full      Impression and Recommendations:      ICD-10-CM   1. Acute sore throat J02.9   2. Dry cough- acute R05   3. Educated About Covid-19 Virus Infection Z71.89   4. Environmental and seasonal allergies J30.89 montelukast (SINGULAIR) 10 MG tablet  5. Desensitization to allergy shot- stopped her allergy shots end of March.  Z51.6 montelukast (SINGULAIR) 10 MG tablet  6. Gastroesophageal reflux disease, esophagitis presence not specified K21.9 cimetidine (TAGAMET) 400 MG tablet    Educated patient about allergy symptoms that are typical versus typical Covid-19 worrisome symptoms.  We discussed covid-19 issues and current guidelines etc.  This sounds like it is environmental especially since she stopped her allergy shots recently. -We discussed over-the-counter supportive medicines and things she can do in addition to her current med regiment.  Also advised she should take meds per her allergist/ specialist treating her for this symptoms, BUT, until patient can get into see them I advise she start Singulair now to help alleviate some of the symptoms. -As soon as possible, she should follow-up with her allergist to make sure this is okay.  --Reviewed that she can try  cimetidine over-the-counter.    Also extensively discuss dietary and lifestyle modification to help prevent reflux symptoms in the first place.  Advised not to lay down at night or on the couch within 3 hours of eating or drinking anything.  As part of my medical decision making, I reviewed the following data within the [place] History obtained from pt/family, CMA notes reviewed  and incorporated, Labs reviewed, Radiograph/ tests reviewed if applicable and OV notes from prior OV's with me, as well as other specialists he has seen since seeing me last, were all reviewed and used  in my medical decision making process today. Additionally, discussion had with patient regarding txmnt plan, their biases about that plan etc were used in my medical decision making today.  I discussed the assessment and treatment plan with the patient. The patient was provided an opportunity to ask questions and all were answered.  The patient agreed with the plan and demonstrated an understanding of the instructions.   No barriers to understanding were identified.  Red flag symptoms and signs discussed in detail.  Patient expressed understanding regarding what to do in case of emergency\urgent symptoms   The patient was advised to call back or seek an in-person evaluation if the symptoms worsen or if the condition fails to improve as anticipated.   Return for 1) medicare wellness sometime in the near future then make a 2nd appt- ov for f/up new meds-started singular and cimetidine in about 6 to 8 weeks or so.     Meds ordered this encounter  Medications  . montelukast (SINGULAIR) 10 MG tablet    Sig: Take 1 tablet (10 mg total) by mouth at bedtime.    Dispense:  90 tablet    Refill:  1  . cimetidine (TAGAMET) 400 MG tablet    Sig: Take 1 tablet (400 mg total) by mouth 2 (two) times daily.    Dispense:  60 tablet    Refill:  0    Medications Discontinued During This Encounter  Medication Reason  . levofloxacin (LEVAQUIN) 500 MG tablet Completed Course      **Gross side effects, risk and benefits, and alternatives of medications and treatment plan in general discussed with patient.  Patient is aware that all medications have potential side effects and we are unable to predict every side effect or drug-drug interaction that may occur.   Patient was strongly encouraged to call with any  questions or concerns they may have concerns.     I provided 23+ minutes of non-face-to-face time during this encounter.   Mellody Dance, DO

## 2018-05-30 ENCOUNTER — Other Ambulatory Visit: Payer: Self-pay

## 2018-05-30 ENCOUNTER — Ambulatory Visit (INDEPENDENT_AMBULATORY_CARE_PROVIDER_SITE_OTHER): Payer: Medicare Other | Admitting: Family Medicine

## 2018-05-30 ENCOUNTER — Encounter: Payer: Self-pay | Admitting: Family Medicine

## 2018-05-30 VITALS — BP 119/66 | HR 63 | Temp 96.9°F | Ht 63.0 in | Wt 123.0 lb

## 2018-05-30 DIAGNOSIS — Z0000 Encounter for general adult medical examination without abnormal findings: Secondary | ICD-10-CM

## 2018-05-30 DIAGNOSIS — E2839 Other primary ovarian failure: Secondary | ICD-10-CM

## 2018-05-30 NOTE — Progress Notes (Signed)
Telehealth office visit note for Kimberly Merritt, D.O- at Primary Care at [facility]   I connected with current patient today and verified that I am speaking with the correct person using two identifiers.   . Location of the patient: Home . Location of the provider: Office Only the patient (+/- their family members at pt's discretion) and myself were participating in the encounter    - This visit type was conducted due to national recommendations for restrictions regarding the COVID-19 Pandemic (e.g. social distancing) in an effort to limit this patient's exposure and mitigate transmission in our community.  This format is felt to be most appropriate for this patient at this time.   - The patient did not have access to video technology or had technical difficulties with video requiring transitioning to audio format only. - No physical exam could be performed with this format, beyond that communicated to [place] by the patient/ family members as noted.   - Additionally my office staff/ schedulers discussed with the patient that there may be a monetary charge related to this service, depending on their medical insurance.   The patient expressed understanding, and agreed to proceed.   Subjective:   Kimberly BENNETTE is a 78 y.o. female who presents for Medicare Annual (Subsequent) preventive examination.  Pt has been doing alot of work outdoors in her flower beds- keeping busy.    Avoiding others, using masks; having others do shopping for her for the most part.   Pt wants to paint her inside of her house.   She has been reading and keeping in touch with friends family on phone.   Denies sadness/ loneliness.    Objective:     Vitals: BP 119/66   Pulse 63   Temp (!) 96.9 F (36.1 C)   Ht 5\' 3"  (1.6 m)   Wt 123 lb (55.8 kg)   BMI 21.79 kg/m   Body mass index is 21.79 kg/m.   Advanced Directives 12/16/2016 11/13/2015 11/12/2014 10/04/2013 09/25/2013 02/03/2011  Does Patient Have a Medical  Advance Directive? No No No No No Patient does not have advance directive  Would patient like information on creating a medical advance directive? - - - No - patient declined information No - patient declined information -    Tobacco Social History   Tobacco Use  Smoking Status Never Smoker  Smokeless Tobacco Never Used      Counseling given: Not Answered   Past Medical History:  Diagnosis Date  . Allergy   . CAD (coronary artery disease)    Nonobstructive. LAD 30% stenosis, small PDA of the circumflex 70% stenosis, right coronary artery 40% stenosis. Catheterization September 2015  . DEPRESSION 08/29/2006  . Diverticulosis   . GERD 08/29/2006  . HEPATIC CYST 08/02/2007  . Hypertension   . HYPOTHYROIDISM 08/29/2006  . LIVER HEMANGIOMA 03/03/2010  . LOW BACK PAIN 08/29/2006  . 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 MUSCLE SPASM 03/03/2010  . Nonischemic cardiomyopathy (HCC)    EF 30%  . OSTEOARTHRITIS 08/29/2006  . OSTEOPENIA 11/19/2008  . PARESTHESIA 03/05/2009  . PVC's (premature ventricular contractions)   . Scoliosis   . VARICOSE VEINS, LOWER EXTREMITIES 08/29/2006    Past Surgical History:  Procedure Laterality Date  . ABDOMINAL HYSTERECTOMY  1981  . APPENDECTOMY    . BACK SURGERY  2010,2006   lumb fusion  . BUNIONECTOMY     both  . BUNIONECTOMY Right 1985  . CARPAL TUNNEL RELEASE     both  .  CARPAL TUNNEL RELEASE Right 1992  . CARPAL TUNNEL RELEASE Left 1992  . CATARACT EXTRACTION     both  . CATARACT EXTRACTION Right 2008  . CATARACT EXTRACTION Left 2008  . HAMMER TOE SURGERY Left 1987  . HAMMER TOE SURGERY Right 1986  . LEFT AND RIGHT HEART CATHETERIZATION WITH CORONARY ANGIOGRAM N/A 10/04/2013   Procedure: LEFT AND RIGHT HEART CATHETERIZATION WITH CORONARY ANGIOGRAM;  Surgeon: Leonie Man, MD;  Location: [facility] CATH LAB;  Service: Cardiovascular;  Laterality: N/A;  . LUMBAR FUSION  2006  . LUMBAR FUSION  2010  . OOPHORECTOMY  1967  . OVARIAN CYST REMOVAL    .  ROTATOR CUFF REPAIR Right 2013  . SHOULDER ARTHROSCOPY  02/09/2011   Procedure: ARTHROSCOPY SHOULDER;  Surgeon: Hessie Dibble, MD;  Location: [place];  Service: Orthopedics;  Laterality: Right;  right shoulder arthroscopy subacromial decompression with rotator cuff repair  . TONSILLECTOMY      Family History  Problem Relation Age of Onset  . Stroke Mother   . Cancer Father   . Emphysema Father   . Hypertension Other   . Breast cancer Neg Hx     Social History   Socioeconomic History  . Marital status: Widowed    Spouse name: Not on file  . Number of children: Not on file  . Years of education: Not on file  . Highest education level: Not on file  Occupational History  . Occupation: Retired  Scientific laboratory technician  . Financial resource strain: Not on file  . Food insecurity:    Worry: Not on file    Inability: Not on file  . Transportation needs:    Medical: Not on file    Non-medical: Not on file  Tobacco Use  . Smoking status: Never Smoker  . Smokeless tobacco: Never Used  Substance and Sexual Activity  . Alcohol use: No    Alcohol/week: 0.0 standard drinks  . Drug use: No  . Sexual activity: Not Currently  Lifestyle  . Physical activity:    Days per week: Not on file    Minutes per session: Not on file  . Stress: Not on file  Relationships  . Social connections:    Talks on phone: Not on file    Gets together: Not on file    Attends religious service: Not on file    Active member of club or organization: Not on file    Attends meetings of clubs or organizations: Not on file    Relationship status: Not on file  Other Topics Concern  . Not on file  Social History Narrative   Lives alone.    -    Outpatient Encounter Medications as of 05/30/2018  Medication Sig  . carvedilol (COREG) 12.5 MG tablet TAKE 1 TABLET BY MOUTH 2 TIMES DAILY.  Marland Kitchen Cholecalciferol (VITAMIN D3) 50 MCG (2000 UT) capsule Take 1 capsule by mouth daily.   . cimetidine (TAGAMET)  400 MG tablet Take 1 tablet (400 mg total) by mouth 2 (two) times daily. (Patient taking differently: Take 200 mg by mouth 2 (two) times daily. )  . cromolyn (NASALCROM) 5.2 MG/ACT nasal spray Place 1 spray into both nostrils 4 (four) times daily.  Marland Kitchen EPINEPHrine 0.3 mg/0.3 mL IJ SOAJ injection   . Glucosamine-Chondroitin 1500-1200 MG/30ML LIQD Take 1 tablet by mouth daily.   Marland Kitchen loratadine (CLARITIN) 10 MG tablet Take 10 mg by mouth daily.  Marland Kitchen losartan (COZAAR) 25 MG tablet TAKE 1 TABLET  BY MOUTH DAILY.  . montelukast (SINGULAIR) 10 MG tablet Take 1 tablet (10 mg total) by mouth at bedtime.  . NON FORMULARY Allergy injections   . Polyethyl Glycol-Propyl Glycol (SYSTANE ULTRA) 0.4-0.3 % SOLN Apply to eye. 2 drops in each eye 3 to 4 times daily  . SALINE NASAL SPRAY NA Place into the nose. 2 sprays each nostril every 4 hours  . sucralfate (CARAFATE) 1 g tablet Take 1 g by mouth daily.  Marland Kitchen SYNTHROID 25 MCG tablet TAKE 1 TABLET BY MOUTH DAILY BEFORE BREAKFAST.  . [DISCONTINUED] acyclovir (ZOVIRAX) 400 MG tablet TAKE 1 TABLET (400 MG TOTAL) BY MOUTH 2 TIMES DAILY.  . [DISCONTINUED] ranitidine (ZANTAC) 75 MG tablet Take 2 tablets (150 mg total) by mouth 2 (two) times daily.   No facility-administered encounter medications on file as of 05/30/2018.     Activities of Daily Living In your present state of health, do you have any difficulty performing the following activities: 05/30/2018 08/16/2017  Hearing? N N  Vision? N N  Difficulty concentrating or making decisions? N N  Walking or climbing stairs? N N  Dressing or bathing? N N  Doing errands, shopping? N N  Some recent data might be hidden    Activities of Daily Living In your present state of health, do you have difficulty performing the following activities? 1- Driving - no 2- Managing money - no 3- Feeding yourself - no 4- Getting from the bed to the chair - no 5- Climbing a flight of stairs - no 6- Preparing food and eating - no 7-  Bathing or showering - no 8- Getting dressed - no 9- Getting to the toilet - no 10- Using the toilet - no 11- Moving around from place to place - no  Patient states that she feels safe at home.  Patient Care Team: Kimberly Dance, DO as PCP - General (Family Medicine) Melrose Nakayama, MD as Attending Physician (Orthopedic Surgery) Magrinat, Virgie Dad, MD (Hematology and Oncology) Clarene Essex, MD (Gastroenterology) Rutherford Guys, MD as Attending Physician (Ophthalmology) Jovita Gamma, MD as Consulting Physician (Neurosurgery) Minus Breeding, MD as Consulting Physician (Cardiology) Levy Sjogren, MD as Referring Physician (Dermatology) Irine Seal, MD as Attending Physician (Urology) Mosetta Anis, MD as Referring Physician (Allergy) Elam Dutch, MD as Consulting Physician (Vascular Surgery)      Assessment:     This is a routine wellness examination for [place].   Exercise Activities and Dietary recommendations Current Exercise Habits: Home exercise routine, Type of exercise: walking(yard work), Frequency (Times/Week): 7  Goals   None     Fall Risk Fall Risk  05/30/2018 02/14/2018 08/16/2017 04/05/2017  Falls in the past year? 0 0 No No  Risk for fall due to : - Impaired mobility - -  Follow up Falls evaluation completed - - -   Is the patient's home free of loose throw rugs in walkways, pet beds, electrical cords, etc?   yes      Grab bars in the bathroom? no      Handrails on the stairs?   yes      Adequate lighting?   yes   Timed Get Up and Go performed: unable to perform   Depression Screen PHQ 2/9 Scores 05/30/2018 05/22/2018 03/09/2018 02/14/2018  PHQ - 2 Score 0 1 0 0  PHQ- 9 Score 0 4 1 2     Depression screen [facility] 2/9 05/30/2018 05/22/2018 03/09/2018 02/14/2018 08/16/2017  Decreased Interest 0 0 0 0 0  Down, Depressed, Hopeless 0 1 0 0 0  PHQ - 2 Score 0 1 0 0 0  Altered sleeping 0 2 0 1 0  Tired, decreased energy 0 1 1 1 1   Change in appetite 0  0 0 0 0  Feeling bad or failure about yourself  0 0 0 0 0  Trouble concentrating 0 0 0 0 0  Moving slowly or fidgety/restless 0 0 0 0 0  Suicidal thoughts 0 0 0 0 0  PHQ-9 Score 0 4 1 2 1   Difficult doing work/chores Not difficult at all - - Somewhat difficult Somewhat difficult    Cognitive Function     6CIT Screen 05/30/2018  What Year? 0 points  What month? 0 points  What time? 0 points  Count back from 20 0 points  Months in reverse 0 points  Repeat phrase 4 points  Total Score 4    Immunization History  Administered Date(s) Administered  . H1N1 02/13/2008  . Influenza Split 11/23/2011, 10/30/2013, 10/09/2014  . Influenza Whole 02/02/2005, 11/10/2007, 11/27/2008, 12/01/2009  . Influenza, High Dose Seasonal PF 10/09/2014, 11/16/2017  . Influenza-Unspecified 11/01/2012, 10/30/2013  . Pneumococcal Conjugate-13 04/18/2013  . Pneumococcal Polysaccharide-23 12/02/2005  . Td 12/08/2004  . Tdap 04/12/2017    Qualifies for Shingles Vaccine? Yes- pt declines  Screening Tests Health Maintenance  Topic Date Due  . INFLUENZA VACCINE  09/02/2018  . TETANUS/TDAP  04/13/2027  . DEXA SCAN  Completed  . PNA vac Low Risk Adult  Completed    Cancer Screenings: Lung: Low Dose CT Chest recommended if Age 69-80 years, 30 pack-year currently smoking OR have quit w/in 15years. Patient does not qualify. Breast:  Up to date on Mammogram? Yes  10/25/2017 negative Up to date of Bone Density/Dexa?  12/06/2011 Colorectal: 06/02/2017 negative repeat prn   Additional Screenings: Hepatitis C Screening:  Pt declines     Plan:     - pt declines Shingrix, Hep C, HIV.    -Bone density test ordered after discussion with patient.  - Novel Covid -19 counseling done; all questions were answered.   - Current CDC and federal guidelines reviewed with patient  - Reminded pt of extreme importance of social distancing; minimizing contacts with others, avoiding ALL but emergency appts etc. - Told  patient to be prepared, not scared; and be smart for the sake of others - told to call with any concerns  - rec pt get flu shot in early Fall.    I have personally reviewed and noted the following in the patient's chart:   . Medical and social history . Use of alcohol, tobacco or illicit drugs  . Current medications and supplements . Functional ability and status . Nutritional status . Physical activity . Advanced directives . List of other physicians . Hospitalizations, surgeries, and ER visits in previous 12 months . Vitals . Screenings to include cognitive, depression, and falls . Referrals and appointments  In addition, I have reviewed and discussed with patient certain preventive protocols, quality metrics, and best practice recommendations. A written personalized care plan for preventive services as well as general preventive health recommendations were provided to patient.     Kimberly Dance, DO  05/30/2018

## 2018-05-31 ENCOUNTER — Other Ambulatory Visit: Payer: Self-pay

## 2018-05-31 ENCOUNTER — Inpatient Hospital Stay: Payer: Medicare Other | Attending: Oncology

## 2018-05-31 DIAGNOSIS — M899 Disorder of bone, unspecified: Secondary | ICD-10-CM | POA: Diagnosis not present

## 2018-05-31 DIAGNOSIS — M949 Disorder of cartilage, unspecified: Secondary | ICD-10-CM | POA: Insufficient documentation

## 2018-05-31 DIAGNOSIS — D472 Monoclonal gammopathy: Secondary | ICD-10-CM | POA: Diagnosis not present

## 2018-05-31 LAB — CBC WITH DIFFERENTIAL/PLATELET
Abs Immature Granulocytes: 0.02 10*3/uL (ref 0.00–0.07)
Basophils Absolute: 0.1 10*3/uL (ref 0.0–0.1)
Basophils Relative: 1 %
Eosinophils Absolute: 0.1 10*3/uL (ref 0.0–0.5)
Eosinophils Relative: 1 %
HCT: 38.7 % (ref 36.0–46.0)
Hemoglobin: 12.7 g/dL (ref 12.0–15.0)
Immature Granulocytes: 0 %
Lymphocytes Relative: 44 %
Lymphs Abs: 3.4 10*3/uL (ref 0.7–4.0)
MCH: 30.8 pg (ref 26.0–34.0)
MCHC: 32.8 g/dL (ref 30.0–36.0)
MCV: 93.9 fL (ref 80.0–100.0)
Monocytes Absolute: 1.2 10*3/uL — ABNORMAL HIGH (ref 0.1–1.0)
Monocytes Relative: 15 %
Neutro Abs: 3 10*3/uL (ref 1.7–7.7)
Neutrophils Relative %: 39 %
Platelets: 299 10*3/uL (ref 150–400)
RBC: 4.12 MIL/uL (ref 3.87–5.11)
RDW: 11.7 % (ref 11.5–15.5)
WBC: 7.8 10*3/uL (ref 4.0–10.5)
nRBC: 0 % (ref 0.0–0.2)

## 2018-05-31 LAB — COMPREHENSIVE METABOLIC PANEL
ALT: 16 U/L (ref 0–44)
AST: 17 U/L (ref 15–41)
Albumin: 3.7 g/dL (ref 3.5–5.0)
Alkaline Phosphatase: 89 U/L (ref 38–126)
Anion gap: 8 (ref 5–15)
BUN: 17 mg/dL (ref 8–23)
CO2: 29 mmol/L (ref 22–32)
Calcium: 8.9 mg/dL (ref 8.9–10.3)
Chloride: 100 mmol/L (ref 98–111)
Creatinine, Ser: 0.83 mg/dL (ref 0.44–1.00)
GFR calc Af Amer: >60 mL/min (ref >60)
GFR calc non Af Amer: >60 mL/min (ref >60)
Glucose, Bld: 86 mg/dL (ref 70–99)
Potassium: 4.6 mmol/L (ref 3.5–5.1)
Sodium: 137 mmol/L (ref 135–145)
Total Bilirubin: 0.4 mg/dL (ref 0.3–1.2)
Total Protein: 7.2 g/dL (ref 6.5–8.1)

## 2018-06-01 LAB — MULTIPLE MYELOMA PANEL, SERUM
Albumin SerPl Elph-Mcnc: 3.6 g/dL (ref 2.9–4.4)
Albumin/Glob SerPl: 1.3 (ref 0.7–1.7)
Alpha 1: 0.2 g/dL (ref 0.0–0.4)
Alpha2 Glob SerPl Elph-Mcnc: 0.7 g/dL (ref 0.4–1.0)
B-Globulin SerPl Elph-Mcnc: 1.8 g/dL — ABNORMAL HIGH (ref 0.7–1.3)
Gamma Glob SerPl Elph-Mcnc: 0.4 g/dL (ref 0.4–1.8)
Globulin, Total: 3 g/dL (ref 2.2–3.9)
IgA: 15 mg/dL — ABNORMAL LOW (ref 64–422)
IgG (Immunoglobin G), Serum: 494 mg/dL — ABNORMAL LOW (ref 586–1602)
IgM (Immunoglobulin M), Srm: 1754 mg/dL — ABNORMAL HIGH (ref 26–217)
M Protein SerPl Elph-Mcnc: 1 g/dL — ABNORMAL HIGH
Total Protein ELP: 6.6 g/dL (ref 6.0–8.5)

## 2018-06-02 ENCOUNTER — Encounter: Payer: Self-pay | Admitting: Oncology

## 2018-06-12 ENCOUNTER — Other Ambulatory Visit: Payer: Self-pay | Admitting: Cardiology

## 2018-06-21 ENCOUNTER — Ambulatory Visit (INDEPENDENT_AMBULATORY_CARE_PROVIDER_SITE_OTHER): Payer: Medicare Other | Admitting: Family Medicine

## 2018-06-21 ENCOUNTER — Other Ambulatory Visit: Payer: Self-pay

## 2018-06-21 ENCOUNTER — Encounter: Payer: Self-pay | Admitting: Family Medicine

## 2018-06-21 VITALS — BP 110/56 | HR 60 | Temp 97.5°F | Ht 63.0 in | Wt 123.0 lb

## 2018-06-21 DIAGNOSIS — K219 Gastro-esophageal reflux disease without esophagitis: Secondary | ICD-10-CM

## 2018-06-21 DIAGNOSIS — J3089 Other allergic rhinitis: Secondary | ICD-10-CM

## 2018-06-21 DIAGNOSIS — R058 Other specified cough: Secondary | ICD-10-CM

## 2018-06-21 DIAGNOSIS — R05 Cough: Secondary | ICD-10-CM | POA: Diagnosis not present

## 2018-06-21 NOTE — Progress Notes (Signed)
Telehealth office visit note for Kimberly Merritt, D.O- at Primary Care at [facility]   I connected with current patient today and verified that I am speaking with the correct person using two identifiers.   . Location of the patient: Home . Location of the provider: Office Only the patient (+/- their family members at pt's discretion) and myself were participating in the encounter    - This visit type was conducted due to national recommendations for restrictions regarding the COVID-19 Pandemic (e.g. social distancing) in an effort to limit this patient's exposure and mitigate transmission in our community.  This format is felt to be most appropriate for this patient at this time.   - The patient did not have access to video technology or had technical difficulties with video requiring transitioning to audio format only. - No physical exam could be performed with this format, beyond that communicated to [place] by the patient/ family members as noted.   - Additionally my office staff/ schedulers discussed with the patient that there may be a monetary charge related to this service, depending on their medical insurance.   The patient expressed understanding, and agreed to proceed.       History of Present Illness:  Pt started on singular and cimetidine last OV at end of April--> 4/20.  HPI from 4/20:   Friday- she was outside all afternoon- for her birthday.  Sx started late Sat afternoon- 5 or 6pm- started out as bad S.T.- painful to swallow- gargling, using vaporizer.  Then coughing started SUNday- dry.  RN and eyes running all Sunday.   Forehead felt warm- one time on Saturday- but NO fevers/ temps noted.  Sun AM- no longer w feeling warm at all.   NO SOB/ DIB. no one-sided face pain.  Patient states this feels just like her typical seasonal allergies that she gets every year.  But admits she was a little worried about COVID so wanted to check with me about these sx             -Pt  taking nasalcrom, claritin D daily.  Doing daily sinus rinses- BID.   Stopped allergy shots mid-to-late march due to Nina virus as she did not want to go into the doctor's office.    GERD: Received note from her pharmacy that ranitidine removed from market.  Patient is wondering what she should take instead.  She does not smoke.  Does not eat particularly late at night or large amounts.  She tries to avoid spicy foods.  Drinking caffeine/ coffee.   Famotidine did not work for her.  She did not know what else to use  Singular- taking it for allergies- feels better.   Less watery eyes, "can breath better for sure", nose not as stuffy, not as much facial pressure/ HA's as well.   She has not been back to her allergist- she doesn't feel comfortable going there now.  She is continuing to do the sinus rinses twice daily followed by Nasalcrom and as well as Claritin daily Etc.  GERD: on cimetidine now- "sx are much, much better".   She "is very pleased with it."   Tolerating well.   She has cut back on caffeine, garlic etc. she still does have a mild dry cough but overall symptoms are much improved.  She is not particularly following my recommendations of not eating or drinking anything within 3 hours of lying down   Impression and Recommendations:  1. Environmental and seasonal allergies   2. Gastroesophageal reflux disease, esophagitis presence not specified   3. Dry cough     -Continue current treatment plan.  -Continue Singulair for her allergies in addition to others as well as cimetidine for her reflux -Reminded patient that beyond medications, it is dietary and lifestyle modifications that help with the treatment of her conditions.  Reminded her that even with allergies, proper hydration is important for congestion in the head helping move it out etc - As part of my medical decision making, I reviewed the following data within the [place] History obtained from pt /family,  CMA notes reviewed and incorporated if applicable, Labs reviewed, Radiograph/ tests reviewed if applicable and OV notes from prior OV's with me, as well as other specialists she/he has seen since seeing me last, were all reviewed and used in my medical decision making process today.   - Additionally, discussion had with patient regarding txmnt plan, and their biases/concerns about that plan were used in my medical decision making today.   - The patient agreed with the plan and demonstrated an understanding of the instructions.   No barriers to understanding were identified.   - Red flag symptoms and signs discussed in detail.  Patient expressed understanding regarding what to do in case of emergency\ urgent symptoms.  The patient was advised to call back or seek an in-person evaluation if the symptoms worsen or if the condition fails to improve as anticipated.   Return for chronic f/up in 4 mo.    Medications Discontinued During This Encounter  Medication Reason  . EPINEPHrine 0.3 mg/0.3 mL IJ SOAJ injection Completed Course     I provided 15+ minutes of non-face-to-face time during this encounter,with over 50% of the time in direct counseling on patients medical conditions/ medical concerns.  Additional time was spent with charting and coordination of care after the actual visit commenced.   Note:  This note was prepared with assistance of Dragon voice recognition software. Occasional wrong-word or sound-a-like substitutions may have occurred due to the inherent limitations of voice recognition software.  Kimberly Dance, DO     Patient Care Team    Relationship Specialty Notifications Start End  Kimberly Dance, DO PCP - General Family Medicine  03/29/17   Melrose Nakayama, MD Attending Physician Orthopedic Surgery  05/28/11   Magrinat, Virgie Dad, MD  Hematology and Oncology  05/28/11   Clarene Essex, MD  Gastroenterology  05/28/11   Rutherford Guys, MD Attending Physician Ophthalmology   05/28/11   Jovita Gamma, MD Consulting Physician Neurosurgery  08/21/13   Minus Breeding, MD Consulting Physician Cardiology  11/29/13   Levy Sjogren, MD Referring Physician Dermatology  04/05/17   Irine Seal, MD Attending Physician Urology  04/05/17   Mosetta Anis, MD Referring Physician Allergy  04/05/17   Elam Dutch, MD Consulting Physician Vascular Surgery  02/02/18      -Vitals obtained; medications/ allergies reconciled;  personal medical, social, Sx etc.histories were updated by CMA, reviewed by me and are reflected in chart   Patient Active Problem List   Diagnosis Date Noted  . Hypertension 04/05/2017    Priority: High  . Benign colonic polyp- found in 07/2011- told f/up Dr Watt Climes 5 yrs 04/05/2017    Priority: Medium  . 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 Nonischemic cardiomyopathy ([place]) 11/29/2013    Priority: Medium  . MONOCLONAL GAMMOPATHY 03/03/2010    Priority: Medium  .  Herpes labialis 12/01/2009    Priority: Medium  . Gastroesophageal reflux disease 08/29/2006    Priority: Medium  . 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 Environmental and seasonal allergies 04/05/2017    Priority: Low  . History of hypoglycemia 04/05/2017    Priority: Low  . Post-menopausal 04/05/2017    Priority: Low  . Hypothyroidism (acquired) 08/29/2006    Priority: Low  . Generalized osteoarthritis 08/29/2006    Priority: Low  . Hyperlipidemia 03/09/2018  . 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 Chronic pain of right knee-since age 6 per patient 04/12/2017  . Prediabetes 04/12/2017  . Vitamin D insufficiency 04/12/2017  . CAD (coronary artery disease) 04/05/2017  . Atherosclerosis of aorta ([place]) 04/05/2017  . 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 History of colonic polyps 04/05/2017  . Scoliosis 04/05/2017  . Ventricular premature depolarization 04/05/2017  . PVC's (premature ventricular contractions)  05/18/2016  . Chronic cough 10/23/2014  . Bradycardia 09/25/2013  . Lip lesion 08/21/2013  . Diverticulosis of colon without hemorrhage 05/02/2013  . Paresthesia 06/20/2012  . Well adult exam 08/31/2011  . Cough 08/10/2011  . Sinusitis, acute frontal 02/15/2011  . Shoulder pain, right 07/09/2010  . LIVER HEMANGIOMA 03/03/2010  . MUSCLE SPASM 03/03/2010  . Disorder of bone and cartilage 11/19/2008  . HEPATIC CYST 08/02/2007  . ALLERGIC RHINITIS 04/25/2007  . HOARSENESS 04/25/2007  . 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 LOW BACK PAIN 08/29/2006  . Pain in Soft Tissues of Limb 08/29/2006     Current Meds  Medication Sig  . carvedilol (COREG) 12.5 MG tablet TAKE 1 TABLET BY MOUTH 2 TIMES DAILY.  Marland Kitchen Cholecalciferol (VITAMIN D3) 50 MCG (2000 UT) capsule Take 1 capsule by mouth daily.   . cimetidine (TAGAMET) 400 MG tablet Take 1 tablet (400 mg total) by mouth 2 (two) times daily. (Patient taking differently: Take 200 mg by mouth 2 (two) times daily. )  . cromolyn (NASALCROM) 5.2 MG/ACT nasal spray Place 1 spray into both nostrils 4 (four) times daily.  . Glucosamine-Chondroitin 1500-1200 MG/30ML LIQD Take 1 tablet by mouth daily.   Marland Kitchen loratadine (CLARITIN) 10 MG tablet Take 10 mg by mouth daily.  Marland Kitchen losartan (COZAAR) 25 MG tablet TAKE 1 TABLET BY MOUTH DAILY.  . montelukast (SINGULAIR) 10 MG tablet Take 1 tablet (10 mg total) by mouth at bedtime.  Vladimir Faster Glycol-Propyl Glycol (SYSTANE ULTRA) 0.4-0.3 % SOLN Apply to eye. 2 drops in each eye 3 to 4 times daily  . SALINE NASAL SPRAY NA Place into the nose. 2 sprays each nostril every 4 hours  . sucralfate (CARAFATE) 1 g tablet Take 1 g by mouth daily as needed.   Marland Kitchen SYNTHROID 25 MCG tablet TAKE 1 TABLET BY MOUTH DAILY BEFORE BREAKFAST.     Allergies:  Allergies  Allergen Reactions  . Acetaminophen Rash    REACTION: high dose - tremor Per pt - rash on chest and heart palpitations  . Alendronate Sodium     Stomach upset.    . Codeine     May interfere with heart condition    . Contrast Media [Iodinated Diagnostic Agents]     Syncope.   . Diflunisal     Stomach pain.    . Doxycycline     n/v  . Flexeril [Cyclobenzaprine]     Abdominal pain.    . Nabumetone     Abdominal pain; dark, tarry stool.    Marland Kitchen  Oxybutynin     Pt does not remember.    . Raloxifene     Unknown.    . Sulfa Antibiotics Nausea Only    Syncope.    . Symbicort [Budesonide-Formoterol Fumarate]     "Jittery-ness."   . Tramadol Hcl     Numbness.    . Diclofenac Palpitations    Dark, tarry stool.    . Latex Rash     ROS:  See above HPI for pertinent positives and negatives   Objective:   Blood pressure (!) 110/56, pulse 60, temperature (!) 97.5 F (36.4 C), temperature source Oral, height 5\' 3"  (1.6 m), weight 123 lb (55.8 kg).  (if some vitals are omitted, this means that patient was UNABLE to obtain them even though they were asked to get them prior to OV today.  They were asked to call us at their earliest convenience with these once obtained. )  General: A & O * 3; sounds in no acute distress; in usual state of health.  Skin: Pt confirms warm and dry extremities and pink fingertips HEENT: Pt confirms lips non-cyanotic Chest: Patient confirms normal chest excursion and movement Respiratory: speaking in full sentences, no conversational dyspnea; patient confirms no use of accessory muscles Psych: insight appears good, mood- appears full

## 2018-06-27 ENCOUNTER — Other Ambulatory Visit: Payer: Self-pay | Admitting: Family Medicine

## 2018-06-27 DIAGNOSIS — K219 Gastro-esophageal reflux disease without esophagitis: Secondary | ICD-10-CM

## 2018-06-28 ENCOUNTER — Telehealth: Payer: Self-pay | Admitting: Cardiology

## 2018-06-28 NOTE — Telephone Encounter (Signed)
New Message    Pt is wondering about her EKG for when she has her telephone visit    Please call

## 2018-06-28 NOTE — Telephone Encounter (Signed)
Spoke with the pt. And she agrees to video visit and will talk with Dr. Percival Spanish during her visit 08/2018.

## 2018-07-20 ENCOUNTER — Telehealth: Payer: Self-pay | Admitting: Family Medicine

## 2018-07-20 NOTE — Telephone Encounter (Signed)
Patient called states she is having severe bursitis pain in Rt hip & tried to get earlier appt w/ Rheumatologist unable to get appt until late July, but wonders if Dr. Raliegh Scarlet can give her some treatment for the pain ---Per Patient she is not seeking Medication but Advice.   --Forwarding message to medical assistant

## 2018-07-27 ENCOUNTER — Other Ambulatory Visit: Payer: Self-pay | Admitting: Family Medicine

## 2018-07-27 DIAGNOSIS — K219 Gastro-esophageal reflux disease without esophagitis: Secondary | ICD-10-CM

## 2018-07-27 DIAGNOSIS — E039 Hypothyroidism, unspecified: Secondary | ICD-10-CM

## 2018-07-27 NOTE — Telephone Encounter (Signed)
Called patient and left message for patient to call the office back. MPulliam, CMA/RT(R)

## 2018-07-31 NOTE — Telephone Encounter (Signed)
Called left message for patient to call the office. MPulliam, CMA/RT(R)  

## 2018-08-01 NOTE — Telephone Encounter (Signed)
Called and spoke to patient - she has otho appt tomorrow 08/02/2018. MPulliam, CMA/RT(R)

## 2018-08-02 DIAGNOSIS — M7061 Trochanteric bursitis, right hip: Secondary | ICD-10-CM | POA: Diagnosis not present

## 2018-08-10 ENCOUNTER — Telehealth: Payer: Self-pay | Admitting: Family Medicine

## 2018-08-10 NOTE — Telephone Encounter (Signed)
Called and advised patient per Dr. Raliegh Scarlet to use OTC Pepto or Mylanta.  Patient to follow up with GI if needed tomorrow. MPulliam, CMA/RT(R)

## 2018-08-10 NOTE — Telephone Encounter (Signed)
Patient called states she is still experiencing indigestion & now her mouth is raw frm eating Strawberries--- wonders what can be done.  --Forwarding message to medical assistant to call patient w/ advice.  --glh

## 2018-08-17 ENCOUNTER — Telehealth: Payer: Self-pay | Admitting: *Deleted

## 2018-08-17 ENCOUNTER — Telehealth: Payer: Self-pay | Admitting: Cardiology

## 2018-08-17 NOTE — Telephone Encounter (Signed)
New Message ° ° ° °Left message to confirm appt and get consent  °

## 2018-08-17 NOTE — Progress Notes (Signed)
HPI The patient presents for follow up of cardiomyopathy. She has been noted to have an ejection fraction of 30%. This was brought to attention after she was found to have frequent premature ventricular contractions. Cardiac catheterization demonstrated nonobstructive and small vessel disease.   EF was 55% on echo with moderate TR in April 2018.   Since I last saw her she has done well.  She is had no new cardiovascular complaints.  She denies any chest pressure, neck or arm discomfort.  She has had no palpitations, presyncope or syncope.  She thinks she is done well since her Coreg was increased.    Allergies  Allergen Reactions  . Acetaminophen Rash    REACTION: high dose - tremor Per pt - rash on chest and heart palpitations  . Alendronate Sodium     Stomach upset.   . Codeine     May interfere with heart condition    . Contrast Media [Iodinated Diagnostic Agents]     Syncope.   . Diflunisal     Stomach pain.    . Doxycycline     n/v  . Flexeril [Cyclobenzaprine]     Abdominal pain.    . Nabumetone     Abdominal pain; dark, tarry stool.    . Oxybutynin     Pt does not remember.    . Raloxifene     Unknown.    . Sulfa Antibiotics Nausea Only    Syncope.    . Symbicort [Budesonide-Formoterol Fumarate]     "Jittery-ness."   . Tramadol Hcl     Numbness.    . Diclofenac Palpitations    Dark, tarry stool.    . Latex Rash    Current Outpatient Medications  Medication Sig Dispense Refill  . carvedilol (COREG) 12.5 MG tablet TAKE 1 TABLET BY MOUTH 2 TIMES DAILY. 180 tablet 3  . Cholecalciferol (VITAMIN D3) 50 MCG (2000 UT) capsule Take 1 capsule by mouth daily.     . cimetidine (TAGAMET) 400 MG tablet Take 1 tablet (400 mg total) by mouth 2 (two) times daily. 180 tablet 1  . cromolyn (NASALCROM) 5.2 MG/ACT nasal spray Place 1 spray into both nostrils 4 (four) times daily.    . Glucosamine-Chondroitin 1500-1200 MG/30ML LIQD Take 1 tablet by mouth daily.     Marland Kitchen loratadine  (CLARITIN) 10 MG tablet Take 10 mg by mouth daily.    Marland Kitchen losartan (COZAAR) 25 MG tablet TAKE 1 TABLET BY MOUTH DAILY. 90 tablet 0  . montelukast (SINGULAIR) 10 MG tablet Take 1 tablet (10 mg total) by mouth at bedtime. 90 tablet 1  . NON FORMULARY Allergy injections     . pantoprazole (PROTONIX) 40 MG tablet Take 40 mg by mouth daily.    Vladimir Faster Glycol-Propyl Glycol (SYSTANE ULTRA) 0.4-0.3 % SOLN Apply to eye. 2 drops in each eye 3 to 4 times daily    . SALINE NASAL SPRAY NA Place into the nose. 2 sprays each nostril every 4 hours    . sucralfate (CARAFATE) 1 g tablet Take 1 g by mouth daily as needed.     Marland Kitchen SYNTHROID 25 MCG tablet Take 1 tablet (25 mcg total) by mouth daily before breakfast. 90 tablet 1   No current facility-administered medications for this visit.     Past Medical History:  Diagnosis Date  . Allergy   . CAD (coronary artery disease)    Nonobstructive. LAD 30% stenosis, small PDA of the circumflex 70% stenosis, right coronary  artery 40% stenosis. Catheterization September 2015  . DEPRESSION 08/29/2006  . Diverticulosis   . GERD 08/29/2006  . HEPATIC CYST 08/02/2007  . Hypertension   . HYPOTHYROIDISM 08/29/2006  . LIVER HEMANGIOMA 03/03/2010  . LOW BACK PAIN 08/29/2006  . 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 MUSCLE SPASM 03/03/2010  . Nonischemic cardiomyopathy (HCC)    EF 30%  . OSTEOARTHRITIS 08/29/2006  . OSTEOPENIA 11/19/2008  . PARESTHESIA 03/05/2009  . PVC's (premature ventricular contractions)   . Scoliosis   . VARICOSE VEINS, LOWER EXTREMITIES 08/29/2006    Past Surgical History:  Procedure Laterality Date  . ABDOMINAL HYSTERECTOMY  1981  . APPENDECTOMY    . BACK SURGERY  2010,2006   lumb fusion  . BUNIONECTOMY     both  . BUNIONECTOMY Right 1985  . CARPAL TUNNEL RELEASE     both  . CARPAL TUNNEL RELEASE Right 1992  . CARPAL TUNNEL RELEASE Left 1992  . CATARACT EXTRACTION     both  . CATARACT EXTRACTION Right 2008  . CATARACT EXTRACTION Left 2008   . HAMMER TOE SURGERY Left 1987  . HAMMER TOE SURGERY Right 1986  . LEFT AND RIGHT HEART CATHETERIZATION WITH CORONARY ANGIOGRAM N/A 10/04/2013   Procedure: LEFT AND RIGHT HEART CATHETERIZATION WITH CORONARY ANGIOGRAM;  Surgeon: Leonie Man, MD;  Location: [facility] CATH LAB;  Service: Cardiovascular;  Laterality: N/A;  . LUMBAR FUSION  2006  . LUMBAR FUSION  2010  . OOPHORECTOMY  1967  . OVARIAN CYST REMOVAL    . ROTATOR CUFF REPAIR Right 2013  . SHOULDER ARTHROSCOPY  02/09/2011   Procedure: ARTHROSCOPY SHOULDER;  Surgeon: Hessie Dibble, MD;  Location: [place];  Service: Orthopedics;  Laterality: Right;  right shoulder arthroscopy subacromial decompression with rotator cuff repair  . TONSILLECTOMY      ROS:     As stated in the HPI and negative for all other systems.   PHYSICAL EXAM BP 120/82   Pulse (!) 58   Ht 5\' 3"  (1.6 m)   Wt 127 lb (57.6 kg)   SpO2 98%   BMI 22.50 kg/m   GENERAL:  Well appearing NECK:  No jugular venous distention, waveform within normal limits, carotid upstroke brisk and symmetric, no bruits, no thyromegaly LUNGS:  Clear to auscultation bilaterally CHEST:  Unremarkable HEART:  PMI not displaced or sustained,S1 and S2 within normal limits, no S3, no S4, no clicks, no rubs, no murmurs ABD:  Flat, positive bowel sounds normal in frequency in pitch, no bruits, no rebound, no guarding, no midline pulsatile mass, no hepatomegaly, no splenomegaly EXT:  2 plus pulses throughout, no edema, no cyanosis no clubbing   EKG: Sinus rhythm, rate 68, axis within normal limits, intervals within normal limits, no acute ST-T wave changes.  ASSESSMENT AND PLAN  CARDIOMYPATHY:    Her last ejection fraction was 55 to 60%.  No change in therapy.  HTN:      Blood pressures well controlled.  She did have one episode of low blood pressure recently and she would let me know if this is a trend and I might need to reduce her medicines.  Otherwise for now continue as  listed.   PVCs:    She is not having any symptoms related to this.  No change in therapy.  TR:    This was moderate on echo in 2018.  I will follow this clinically.  There is no suggestion that it is clinically progressed.

## 2018-08-17 NOTE — Telephone Encounter (Signed)
    COVID-19 Pre-Screening Questions:  . In the past 7 to 10 days have you had a cough,  shortness of breath, headache, congestion, fever (100 or greater) body aches, chills, sore throat, or sudden loss of taste or sense of smell? . Have you been around anyone with known Covid 19. . Have you been around anyone who is awaiting Covid 19 test results in the past 7 to 10 days? . Have you been around anyone who has been exposed to Covid 19, or has mentioned symptoms of Covid 19 within the past 7 to 10 days?  If you have any concerns/questions about symptoms patients report during screening (either on the phone or at threshold). Contact the provider seeing the patient or DOD for further guidance.  If neither are available contact a member of the leadership team.      PATIENT ANSWERED NO TO THE ABOVE QUESTIONS FOR IN OFFICE VISIT TOMORROW

## 2018-08-17 NOTE — Telephone Encounter (Signed)

## 2018-08-18 ENCOUNTER — Other Ambulatory Visit: Payer: Self-pay

## 2018-08-18 ENCOUNTER — Encounter: Payer: Self-pay | Admitting: Cardiology

## 2018-08-18 ENCOUNTER — Ambulatory Visit (INDEPENDENT_AMBULATORY_CARE_PROVIDER_SITE_OTHER): Payer: Medicare Other | Admitting: Cardiology

## 2018-08-18 VITALS — BP 120/82 | HR 58 | Ht 63.0 in | Wt 127.0 lb

## 2018-08-18 DIAGNOSIS — I10 Essential (primary) hypertension: Secondary | ICD-10-CM | POA: Diagnosis not present

## 2018-08-18 NOTE — Patient Instructions (Addendum)
Medication Instructions:  Your physician recommends that you continue on your current medications as directed. Please refer to the Current Medication list given to you today.  If you need a refill on your cardiac medications before your next appointment, please call your pharmacy.   Lab work: NONE I Testing/Procedures: NONE  Follow-Up: At [facility], you and your health needs are our priority.  As part of our continuing mission to provide you with exceptional heart care, we have created designated Provider Care Teams.  These Care Teams include your primary Cardiologist (physician) and Advanced Practice Providers (APPs -  Physician Assistants and Nurse Practitioners) who all work together to provide you with the care you need, when you need it. You will need a follow up appointment in 12 months.  Please call our office 2 months in advance to schedule this appointment.  You may see DR [facility]  or one of the following Advanced Practice Providers on your designated Care Team:   Rosaria Ferries, PA-C . Jory Sims, DNP, ANP

## 2018-08-31 ENCOUNTER — Other Ambulatory Visit: Payer: Self-pay | Admitting: Family Medicine

## 2018-08-31 DIAGNOSIS — Z1231 Encounter for screening mammogram for malignant neoplasm of breast: Secondary | ICD-10-CM

## 2018-09-04 DIAGNOSIS — M7061 Trochanteric bursitis, right hip: Secondary | ICD-10-CM | POA: Diagnosis not present

## 2018-09-06 DIAGNOSIS — M7061 Trochanteric bursitis, right hip: Secondary | ICD-10-CM | POA: Diagnosis not present

## 2018-09-11 ENCOUNTER — Other Ambulatory Visit: Payer: Self-pay | Admitting: Cardiology

## 2018-09-12 DIAGNOSIS — M7061 Trochanteric bursitis, right hip: Secondary | ICD-10-CM | POA: Diagnosis not present

## 2018-09-14 DIAGNOSIS — M7061 Trochanteric bursitis, right hip: Secondary | ICD-10-CM | POA: Diagnosis not present

## 2018-09-18 DIAGNOSIS — M7061 Trochanteric bursitis, right hip: Secondary | ICD-10-CM | POA: Diagnosis not present

## 2018-09-20 DIAGNOSIS — M7061 Trochanteric bursitis, right hip: Secondary | ICD-10-CM | POA: Diagnosis not present

## 2018-09-25 DIAGNOSIS — M7061 Trochanteric bursitis, right hip: Secondary | ICD-10-CM | POA: Diagnosis not present

## 2018-09-27 DIAGNOSIS — M7061 Trochanteric bursitis, right hip: Secondary | ICD-10-CM | POA: Diagnosis not present

## 2018-10-02 DIAGNOSIS — M7061 Trochanteric bursitis, right hip: Secondary | ICD-10-CM | POA: Diagnosis not present

## 2018-10-04 DIAGNOSIS — M7061 Trochanteric bursitis, right hip: Secondary | ICD-10-CM | POA: Diagnosis not present

## 2018-10-05 DIAGNOSIS — M7061 Trochanteric bursitis, right hip: Secondary | ICD-10-CM | POA: Diagnosis not present

## 2018-10-13 DIAGNOSIS — M7061 Trochanteric bursitis, right hip: Secondary | ICD-10-CM | POA: Diagnosis not present

## 2018-10-16 DIAGNOSIS — M7061 Trochanteric bursitis, right hip: Secondary | ICD-10-CM | POA: Diagnosis not present

## 2018-10-19 DIAGNOSIS — M7061 Trochanteric bursitis, right hip: Secondary | ICD-10-CM | POA: Diagnosis not present

## 2018-10-23 ENCOUNTER — Ambulatory Visit (INDEPENDENT_AMBULATORY_CARE_PROVIDER_SITE_OTHER): Payer: Medicare Other | Admitting: Family Medicine

## 2018-10-23 ENCOUNTER — Encounter: Payer: Self-pay | Admitting: Family Medicine

## 2018-10-23 ENCOUNTER — Other Ambulatory Visit: Payer: Self-pay

## 2018-10-23 VITALS — BP 145/78 | HR 64 | Temp 97.7°F | Resp 16 | Ht 64.0 in | Wt 128.1 lb

## 2018-10-23 DIAGNOSIS — I10 Essential (primary) hypertension: Secondary | ICD-10-CM

## 2018-10-23 DIAGNOSIS — R8290 Unspecified abnormal findings in urine: Secondary | ICD-10-CM

## 2018-10-23 DIAGNOSIS — J3089 Other allergic rhinitis: Secondary | ICD-10-CM

## 2018-10-23 DIAGNOSIS — R7303 Prediabetes: Secondary | ICD-10-CM | POA: Diagnosis not present

## 2018-10-23 DIAGNOSIS — C44712 Basal cell carcinoma of skin of right lower limb, including hip: Secondary | ICD-10-CM | POA: Diagnosis not present

## 2018-10-23 DIAGNOSIS — R300 Dysuria: Secondary | ICD-10-CM | POA: Diagnosis not present

## 2018-10-23 DIAGNOSIS — E039 Hypothyroidism, unspecified: Secondary | ICD-10-CM | POA: Diagnosis not present

## 2018-10-23 DIAGNOSIS — D0472 Carcinoma in situ of skin of left lower limb, including hip: Secondary | ICD-10-CM | POA: Diagnosis not present

## 2018-10-23 DIAGNOSIS — K219 Gastro-esophageal reflux disease without esophagitis: Secondary | ICD-10-CM

## 2018-10-23 LAB — POCT URINALYSIS DIPSTICK OB
Bilirubin, UA: NEGATIVE
Glucose, UA: NEGATIVE
Ketones, UA: NEGATIVE
Leukocytes, UA: NEGATIVE
Nitrite, UA: NEGATIVE
POC,PROTEIN,UA: NEGATIVE
Spec Grav, UA: 1.01 (ref 1.010–1.025)
Urobilinogen, UA: 0.2 E.U./dL
pH, UA: 6.5 (ref 5.0–8.0)

## 2018-10-23 NOTE — Patient Instructions (Signed)

## 2018-10-23 NOTE — Progress Notes (Signed)
Impression and Recommendations:    1. Dysuria   2. Abnormal urinalysis   3. Hypertension, unspecified type   4. Gastroesophageal reflux disease, esophagitis presence not specified   5. Hypothyroidism (acquired)   6. Environmental and seasonal allergies   7. Prediabetes     - Full labs obtained today.  See orders.  Dysuria - Discussed that we may need to send pt urine to culture today. - Urinalysis obtained today.  Sent for culture.  - Reviewed that we will not start antibiotics unless necessary. - Will continue to monitor.  GERD - Managed on Cimetidine and Protonix. - Per pt, managed on Protonix through Dr. Watt Climes. - Advised patient to continue to follow up with Dr. Watt Climes as established. - Per pt, GERD under "pretty good control now." - Pt states she also continues Cimetidine PRN.  - Will continue to monitor.  Hypothyroidism - Pt asymptomatic at this time. - Continue treatment plan as established.  See med list. - Patient tolerating meds well without complication.  Denies S-E  - Will continue to monitor and re-check as recommended.  Hypertension - Per pt, BP stable at home. - Continue treatment plan as established.  See med list. - Patient tolerating meds well without complication.  Denies S-E  - Ongoing ambulatory BP encouraged. - Will continue to monitor.  Environmental and Seasonal Allergies - Historically managed on Singulair. - Per patient, no longer taking Singulair regularly. - States the Singulair caused her to feel "jittery inside." - Singulair discontinued due to S-E.  - Continues on Claritin.  - Per pt, switches off from Allegra to Claritin time to time.  - Advised the patient to continue using AYR or Neilmed sinus rinses BID. Advised that the patient may also continue to incorporate allegra or claritin PRN.      Orders Placed This Encounter  Procedures  . Urine Culture  . CBC with Differential/Platelet  . Comprehensive metabolic  panel  . Hemoglobin A1c  . Lipid panel  . T3  . T4, free  . TSH  . VITAMIN D 25 Hydroxy (Vit-D Deficiency, Fractures)  . Direct LDL  . POC Urinalysis Dipstick OB    No orders of the defined types were placed in this encounter.   Medications Discontinued During This Encounter  Medication Reason  . NON FORMULARY   . montelukast (SINGULAIR) 10 MG tablet Side effect (s)     Gross side effects, risk and benefits, and alternatives of medications and treatment plan in general discussed with patient.  Patient is aware that all medications have potential side effects and we are unable to predict every side effect or drug-drug interaction that may occur.   Patient will call with any questions prior to using medication if they have concerns.    Expresses verbal understanding and consents to current therapy and treatment regimen.  No barriers to understanding were identified.  Red flag symptoms and signs discussed in detail.  Patient expressed understanding regarding what to do in case of emergency\urgent symptoms  Please see AVS handed out to patient at the end of our visit for further patient instructions/ counseling done pertaining to today's office visit.   Return in about 4 months (around 02/22/2019) for chronic conditions; we will call with lab results and urine culture results near future.     Note:  This note was prepared with assistance of Dragon voice recognition software. Occasional wrong-word or sound-a-like substitutions may have occurred due to the inherent limitations of  voice recognition software.    This document serves as a record of services personally performed by Mellody Dance, DO. It was created on her behalf by [name], a trained medical scribe. The creation of this record is based on the scribe's personal observations and the provider's statements to them.   I have reviewed the above medical documentation for accuracy and completeness and I concur.   Mellody Dance, DO 10/24/2018 1:18 PM       --------------------------------------------------------------------------------------------------------------------------------------------------------------------------------------------------------------------------------------------    Subjective:     HPI: Kimberly Merritt is a 78 y.o. female who presents to [place] [facility] today for issues as discussed below.  UTI Concerns Concerned about UTI today. States "sx been off and on for a couple of weeks."  When she had therapy (was obtaining therapy for bursitis at [facility]), "and I couldn't go to the bathroom," it "seems like if I have to hold my urine, then I get some problems."  Took AZO OTC a couple times and "that helped at that time," (a week before last).  States "but then it came back with the burning while urination, urgency to urinate," and "then I would go there and sit there and nothing would come out."  Notes not as much burning while urination today.  Took AZO the evening of 16th,17th, and 18th, and stopped, "and now I'm having that again, where I'm running to the bathroom every couple of hours."  Denies fever, chills, nausea, vomiting.  Additionally notes on the 16th, she was out, and had a bowel movement, and "couldn't clean myself until later."  Denies vaginal discharge.  Hypertension - Blood Pressure at Home States BP is not up at home; just "running behind and rushed today." Runs about 112/66-69 at home. Denies dizziness or lightheadedness at home. Denies elevated readings at home.  Hypothyroidism States she gets tired real easily.   Thinks over the past 2-3 months she's had bursitis and therapy for that.  Notes "with the pain and that kind of stuff," she hasn't felt totally well.  Denies concerns about symptoms otherwise.  Environmental & Seasonal Allergies Recently followed up with allergist.  States she is allergic to  pollen.  Says she has not been taking her Singulair. States "a few times it worked well in the very beginning at nighttime."  Started taking it before 8:30 and going to bed a couple of hours later, and "that worked for a while," then "in the morning when I woke up, it was like I was kinda jittery on the inside."  Says she then decided to "just take it if I feel like I start getting really short of breath or something like that."  Says she now uses it on "kind of a temporary basis, but not every day."  Continues using Claritin.  States "not having that much congestion anymore."  Trying to use regular Claritin now, and "if that doesn't work, I will go back to regular Allegra."  Using NeilMed irrigation morning and night, nighttime in particular. States she can really tell if she doesn't use the sinus rinses.  GERD Continues taking Cimetidine. Notes that her symptoms are "much better" "since I've been on Protonix along with that." Notes Dr. Watt Climes placed her on 40 Protonix.   Wt Readings from Last 3 Encounters:  10/23/18 128 lb 1.6 oz (58.1 kg)  08/18/18 127 lb (57.6 kg)  06/21/18 123 lb (55.8 kg)   BP Readings from Last 3 Encounters:  10/23/18 (!) 145/78  08/18/18 120/82  06/21/18 (!) 110/56   Pulse Readings from Last 3 Encounters:  10/23/18 64  08/18/18 (!) 58  06/21/18 60   BMI Readings from Last 3 Encounters:  10/23/18 21.99 kg/m  08/18/18 22.50 kg/m  06/21/18 21.79 kg/m     Patient Care Team    Relationship Specialty Notifications Start End  Mellody Dance, DO PCP - General Family Medicine  03/29/17   Melrose Nakayama, MD Attending Physician Orthopedic Surgery  05/28/11   Magrinat, Virgie Dad, MD  Hematology and Oncology  05/28/11   Clarene Essex, MD  Gastroenterology  05/28/11   Rutherford Guys, MD Attending Physician Ophthalmology  05/28/11   Jovita Gamma, MD Consulting Physician Neurosurgery  08/21/13   Minus Breeding, MD Consulting Physician Cardiology  11/29/13    Levy Sjogren, MD Referring Physician Dermatology  04/05/17   Irine Seal, MD Attending Physician Urology  04/05/17   Mosetta Anis, MD Referring Physician Allergy  04/05/17   Elam Dutch, MD Consulting Physician Vascular Surgery  02/02/18      Patient Active Problem List   Diagnosis Date Noted  . Hypertension 04/05/2017    Priority: High  . Benign colonic polyp- found in 07/2011- told f/up Dr Watt Climes 5 yrs 04/05/2017    Priority: Medium  . 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 Nonischemic cardiomyopathy ([place]) 11/29/2013    Priority: Medium  . MONOCLONAL GAMMOPATHY 03/03/2010    Priority: Medium  . Herpes labialis 12/01/2009    Priority: Medium  . Gastroesophageal reflux disease 08/29/2006    Priority: Medium  . 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 Environmental and seasonal allergies 04/05/2017    Priority: Low  . History of hypoglycemia 04/05/2017    Priority: Low  . Post-menopausal 04/05/2017    Priority: Low  . Hypothyroidism (acquired) 08/29/2006    Priority: Low  . Generalized osteoarthritis 08/29/2006    Priority: Low  . Hyperlipidemia 03/09/2018  . 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 Chronic pain of right knee-since age 3 per patient 04/12/2017  . Prediabetes 04/12/2017  . Vitamin D insufficiency 04/12/2017  . CAD (coronary artery disease) 04/05/2017  . Atherosclerosis of aorta ([place]) 04/05/2017  . 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 History of colonic polyps 04/05/2017  . Scoliosis 04/05/2017  . Ventricular premature depolarization 04/05/2017  . PVC's (premature ventricular contractions) 05/18/2016  . Chronic cough 10/23/2014  . Bradycardia 09/25/2013  . Lip lesion 08/21/2013  . Diverticulosis of colon without hemorrhage 05/02/2013  . Paresthesia 06/20/2012  . Well adult exam 08/31/2011  . Cough 08/10/2011  . Sinusitis, acute frontal 02/15/2011  . Shoulder pain, right 07/09/2010   . LIVER HEMANGIOMA 03/03/2010  . MUSCLE SPASM 03/03/2010  . Disorder of bone and cartilage 11/19/2008  . HEPATIC CYST 08/02/2007  . ALLERGIC RHINITIS 04/25/2007  . HOARSENESS 04/25/2007  . 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 LOW BACK PAIN 08/29/2006  . Pain in Soft Tissues of Limb 08/29/2006    Past Medical history, Surgical history, Family history, Social history, Allergies and Medications have been entered into the medical record, reviewed and changed as needed.    Current Meds  Medication Sig  . carvedilol (COREG) 12.5 MG tablet TAKE 1 TABLET BY MOUTH 2 TIMES DAILY.  Marland Kitchen Cholecalciferol (VITAMIN D3) 50 MCG (2000 UT) capsule Take 1 capsule by mouth daily.   . cimetidine (TAGAMET) 400 MG tablet Take 1 tablet (400 mg total) by  mouth 2 (two) times daily.  . cromolyn (NASALCROM) 5.2 MG/ACT nasal spray Place 1 spray into both nostrils 4 (four) times daily.  . Glucosamine-Chondroitin 1500-1200 MG/30ML LIQD Take 1 tablet by mouth daily.   Marland Kitchen loratadine (CLARITIN) 10 MG tablet Take 10 mg by mouth daily.  Marland Kitchen losartan (COZAAR) 25 MG tablet TAKE 1 TABLET BY MOUTH DAILY.  . pantoprazole (PROTONIX) 40 MG tablet Take 40 mg by mouth daily.  Vladimir Faster Glycol-Propyl Glycol (SYSTANE ULTRA) 0.4-0.3 % SOLN Apply to eye. 2 drops in each eye 3 to 4 times daily  . SALINE NASAL SPRAY NA Place into the nose. 2 sprays each nostril every 4 hours  . sucralfate (CARAFATE) 1 g tablet Take 1 g by mouth daily as needed.   Marland Kitchen SYNTHROID 25 MCG tablet Take 1 tablet (25 mcg total) by mouth daily before breakfast.    Allergies:  Allergies  Allergen Reactions  . Acetaminophen Rash    REACTION: high dose - tremor Per pt - rash on chest and heart palpitations  . Alendronate Sodium     Stomach upset.   . Codeine     May interfere with heart condition    . Contrast Media [Iodinated Diagnostic Agents]     Syncope.   . Diflunisal     Stomach pain.    . Doxycycline     n/v  . Flexeril [Cyclobenzaprine]      Abdominal pain.    . Nabumetone     Abdominal pain; dark, tarry stool.    . Oxybutynin     Pt does not remember.    . Raloxifene     Unknown.    . Sulfa Antibiotics Nausea Only    Syncope.    . Symbicort [Budesonide-Formoterol Fumarate]     "Jittery-ness."   . Tramadol Hcl     Numbness.    . Diclofenac Palpitations    Dark, tarry stool.    . Latex Rash     Review of Systems:  A fourteen system review of systems was performed and found to be positive as per HPI.   Objective:   Blood pressure (!) 145/78, pulse 64, temperature 97.7 F (36.5 C), resp. rate 16, height 5\' 4"  (1.626 m), weight 128 lb 1.6 oz (58.1 kg), SpO2 99 %. Body mass index is 21.99 kg/m. General:  Well Developed, well nourished, appropriate for stated age.  Neuro:  Alert and oriented,  extra-ocular muscles intact  HEENT:  Normocephalic, atraumatic, neck supple, no carotid bruits appreciated  Skin:  no gross rash, warm, pink. Cardiac:  RRR, S1 S2 Respiratory:  ECTA B/L and A/P, Not using accessory muscles, speaking in full sentences- unlabored. Vascular:  Ext warm, no cyanosis apprec.; cap RF less 2 sec. Psych:  No HI/SI, judgement and insight good, Euthymic mood. Full Affect. Abdominal: Subjective pain on light palpation of costophrenic angle posteriorly, equally bilateral. Bowel sounds *4, no GRR, no SPT, no masses appreciated.

## 2018-10-24 LAB — COMPREHENSIVE METABOLIC PANEL
ALT: 16 IU/L (ref 0–32)
AST: 16 IU/L (ref 0–40)
Albumin/Globulin Ratio: 1.5 (ref 1.2–2.2)
Albumin: 4 g/dL (ref 3.7–4.7)
Alkaline Phosphatase: 86 IU/L (ref 39–117)
BUN/Creatinine Ratio: 24 (ref 12–28)
BUN: 19 mg/dL (ref 8–27)
Bilirubin Total: 0.4 mg/dL (ref 0.0–1.2)
CO2: 27 mmol/L (ref 20–29)
Calcium: 9.2 mg/dL (ref 8.7–10.3)
Chloride: 99 mmol/L (ref 96–106)
Creatinine, Ser: 0.8 mg/dL (ref 0.57–1.00)
GFR calc Af Amer: 82 mL/min/{1.73_m2} (ref >59)
GFR calc non Af Amer: 71 mL/min/{1.73_m2} (ref >59)
Globulin, Total: 2.6 g/dL (ref 1.5–4.5)
Glucose: 83 mg/dL (ref 65–99)
Potassium: 4.7 mmol/L (ref 3.5–5.2)
Sodium: 137 mmol/L (ref 134–144)
Total Protein: 6.6 g/dL (ref 6.0–8.5)

## 2018-10-24 LAB — HEMOGLOBIN A1C
Est. average glucose Bld gHb Est-mCnc: 114 mg/dL
Hgb A1c MFr Bld: 5.6 % (ref 4.8–5.6)

## 2018-10-24 LAB — CBC WITH DIFFERENTIAL/PLATELET
Basophils Absolute: 0.1 10*3/uL (ref 0.0–0.2)
Basos: 1 %
EOS (ABSOLUTE): 0.1 10*3/uL (ref 0.0–0.4)
Eos: 2 %
Hematocrit: 38 % (ref 34.0–46.6)
Hemoglobin: 12.6 g/dL (ref 11.1–15.9)
Immature Grans (Abs): 0 10*3/uL (ref 0.0–0.1)
Immature Granulocytes: 0 %
Lymphocytes Absolute: 3 10*3/uL (ref 0.7–3.1)
Lymphs: 41 %
MCH: 31.1 pg (ref 26.6–33.0)
MCHC: 33.2 g/dL (ref 31.5–35.7)
MCV: 94 fL (ref 79–97)
Monocytes Absolute: 1 10*3/uL — ABNORMAL HIGH (ref 0.1–0.9)
Monocytes: 14 %
Neutrophils Absolute: 3 10*3/uL (ref 1.4–7.0)
Neutrophils: 42 %
Platelets: 313 10*3/uL (ref 150–450)
RBC: 4.05 x10E6/uL (ref 3.77–5.28)
RDW: 11.2 % — ABNORMAL LOW (ref 11.7–15.4)
WBC: 7.2 10*3/uL (ref 3.4–10.8)

## 2018-10-24 LAB — VITAMIN D 25 HYDROXY (VIT D DEFICIENCY, FRACTURES): Vit D, 25-Hydroxy: 42.5 ng/mL (ref 30.0–100.0)

## 2018-10-24 LAB — LIPID PANEL
Chol/HDL Ratio: 2.5 ratio (ref 0.0–4.4)
Cholesterol, Total: 183 mg/dL (ref 100–199)
HDL: 74 mg/dL (ref >39)
LDL Chol Calc (NIH): 99 mg/dL (ref 0–99)
Triglycerides: 52 mg/dL (ref 0–149)
VLDL Cholesterol Cal: 10 mg/dL (ref 5–40)

## 2018-10-24 LAB — T3: T3, Total: 94 ng/dL (ref 71–180)

## 2018-10-24 LAB — TSH: TSH: 1.66 u[IU]/mL (ref 0.450–4.500)

## 2018-10-24 LAB — T4, FREE: Free T4: 1.38 ng/dL (ref 0.82–1.77)

## 2018-10-24 LAB — LDL CHOLESTEROL, DIRECT: LDL Direct: 106 mg/dL — ABNORMAL HIGH (ref 0–99)

## 2018-10-25 LAB — URINE CULTURE

## 2018-10-26 ENCOUNTER — Telehealth: Payer: Self-pay | Admitting: Family Medicine

## 2018-10-26 DIAGNOSIS — M7061 Trochanteric bursitis, right hip: Secondary | ICD-10-CM | POA: Diagnosis not present

## 2018-10-26 NOTE — Telephone Encounter (Signed)
Patient is requesting a call back about her urine culture that was done Monday. Please advise

## 2018-10-26 NOTE — Telephone Encounter (Signed)
Pt's urine culture was negative.  However, she states that she is still having back pain, urinary frequency, a white coating on tongue, and is having redness and sensitivity on her "bottom".  Please advise.  Charyl Bigger, CMA

## 2018-10-26 NOTE — Telephone Encounter (Signed)
LVM for pt to call to discuss results.  T. Alexus Michael, CMA  

## 2018-10-27 ENCOUNTER — Telehealth: Payer: Self-pay | Admitting: Family Medicine

## 2018-10-27 NOTE — Telephone Encounter (Signed)
Per patient back pain persistent& provider chart notes state if if continues she can be referred out to Urology --forwarding information to [facility] for appt scheduling (Patient wishes to be called 1st to avoid conflict with other doctor appts.  -glh

## 2018-10-27 NOTE — Telephone Encounter (Signed)
Please see result note for patient as I do not want you folks to do double work and call her twice.  Below is the note I placed attached to her recent labs:  Please call and let patient know that all of her labs look fantastic.  They are actually even improved from prior which is wonderful.  She sent a message recently to [place] regarding continued symptoms after her acute appointment that I saw her for on September 21.  Please let patient know that her kidney function and all is perfect as well as her urine culture was completely negative.  If patient is continuing to have urinary symptoms, please let her know the next step would be to send her to a urologist.  If she agrees to this please place place a referral to urology and use the diagnosis code of dysuria and urinary frequency despite normal labs and urine culture  Also, patient had complained of some new perirectal symptoms in her message to [place], please let her know that this is a separate issue and she will need to come in and make an appointment for me to look at her rectal region to make sure there is nothing going on there.  If the symptoms are bad and she cannot wait, she can always had to an urgent care today or over the weekend prior to waiting for an appointment with me next week for this.  It will need to be in person.  Dr. Raliegh Scarlet

## 2018-10-30 NOTE — Telephone Encounter (Addendum)
Spoke with patient and her bottom has gotten better.  No other concerns at this time.  Patient does not want to follow up with a Urologist at this time.  Copy of labs mailed per patient request.

## 2018-10-30 NOTE — Telephone Encounter (Signed)
Spoke with patient and she would like to wait on urology referral.

## 2018-10-31 DIAGNOSIS — M7061 Trochanteric bursitis, right hip: Secondary | ICD-10-CM | POA: Diagnosis not present

## 2018-11-02 DIAGNOSIS — M7061 Trochanteric bursitis, right hip: Secondary | ICD-10-CM | POA: Diagnosis not present

## 2018-11-03 DIAGNOSIS — M7061 Trochanteric bursitis, right hip: Secondary | ICD-10-CM | POA: Diagnosis not present

## 2018-11-14 ENCOUNTER — Other Ambulatory Visit: Payer: Self-pay

## 2018-11-14 ENCOUNTER — Ambulatory Visit
Admission: RE | Admit: 2018-11-14 | Discharge: 2018-11-14 | Disposition: A | Discharge status: Home / Self Care | Payer: Medicare Other | Source: Ambulatory Visit | Attending: Family Medicine | Admitting: Family Medicine

## 2018-11-14 DIAGNOSIS — M8589 Other specified disorders of bone density and structure, multiple sites: Secondary | ICD-10-CM | POA: Diagnosis not present

## 2018-11-14 DIAGNOSIS — E2839 Other primary ovarian failure: Secondary | ICD-10-CM

## 2018-11-14 DIAGNOSIS — Z780 Asymptomatic menopausal state: Secondary | ICD-10-CM | POA: Diagnosis not present

## 2018-11-14 DIAGNOSIS — Z1231 Encounter for screening mammogram for malignant neoplasm of breast: Secondary | ICD-10-CM | POA: Diagnosis not present

## 2018-11-15 ENCOUNTER — Telehealth: Payer: Self-pay | Admitting: Family Medicine

## 2018-11-15 NOTE — Telephone Encounter (Signed)
Waiting on reply from provider from her previous concerns and at this time no one has called her as we are awaiting a response

## 2018-11-15 NOTE — Telephone Encounter (Signed)
Patient called and was returning a call from earlier, please advise.

## 2018-11-16 ENCOUNTER — Telehealth: Payer: Self-pay | Admitting: Family Medicine

## 2018-11-16 NOTE — Telephone Encounter (Signed)
Patient was advised to start back on calcium supplement, but has some concerns prior to starting this med. She is wishing to speak with clinic staff about this and get some answers about these concerns.

## 2018-11-16 NOTE — Telephone Encounter (Signed)
Spoke with patient in depth on 11/15/2018 in regards to the calcium supplement and what Dr Raliegh Scarlet stated in her results. Please advise if patient may need a telehealth/office visit to discuss this since she seems to have more concerns and questions that can be answered by the [facility]

## 2018-11-16 NOTE — Telephone Encounter (Signed)
I am so sorry Kimberly Merritt I know I replied to this last night but not sure where went as I cannot find my response!  I had previously stated that the calcium intake in all postmenopausal women, should be 1200 mg daily as well as vitamin D of 1600 IUs daily.   These are not doses that are in excess that could potentially harm her. -And, if she has additional concerns, we can always meet up-  I am always happy to have a telehealth office visit to discuss with her if she has additional questions and concerns.

## 2018-11-16 NOTE — Telephone Encounter (Signed)
Please schedule patient for a telehealth to discuss calcium supplement.

## 2018-11-20 DIAGNOSIS — D225 Melanocytic nevi of trunk: Secondary | ICD-10-CM | POA: Diagnosis not present

## 2018-11-20 DIAGNOSIS — Z08 Encounter for follow-up examination after completed treatment for malignant neoplasm: Secondary | ICD-10-CM | POA: Diagnosis not present

## 2018-11-20 DIAGNOSIS — Z85828 Personal history of other malignant neoplasm of skin: Secondary | ICD-10-CM | POA: Diagnosis not present

## 2018-11-30 ENCOUNTER — Inpatient Hospital Stay: Payer: Medicare Other | Attending: Oncology

## 2018-11-30 ENCOUNTER — Other Ambulatory Visit: Payer: Self-pay

## 2018-11-30 DIAGNOSIS — H353121 Nonexudative age-related macular degeneration, left eye, early dry stage: Secondary | ICD-10-CM | POA: Diagnosis not present

## 2018-11-30 DIAGNOSIS — D472 Monoclonal gammopathy: Secondary | ICD-10-CM | POA: Insufficient documentation

## 2018-11-30 DIAGNOSIS — H52202 Unspecified astigmatism, left eye: Secondary | ICD-10-CM | POA: Diagnosis not present

## 2018-11-30 DIAGNOSIS — H524 Presbyopia: Secondary | ICD-10-CM | POA: Diagnosis not present

## 2018-11-30 DIAGNOSIS — M899 Disorder of bone, unspecified: Secondary | ICD-10-CM

## 2018-11-30 DIAGNOSIS — Z961 Presence of intraocular lens: Secondary | ICD-10-CM | POA: Diagnosis not present

## 2018-11-30 DIAGNOSIS — H04123 Dry eye syndrome of bilateral lacrimal glands: Secondary | ICD-10-CM | POA: Diagnosis not present

## 2018-11-30 LAB — CBC WITH DIFFERENTIAL/PLATELET
Abs Immature Granulocytes: 0.02 10*3/uL (ref 0.00–0.07)
Basophils Absolute: 0.1 10*3/uL (ref 0.0–0.1)
Basophils Relative: 1 %
Eosinophils Absolute: 0.1 10*3/uL (ref 0.0–0.5)
Eosinophils Relative: 2 %
HCT: 38.7 % (ref 36.0–46.0)
Hemoglobin: 12.5 g/dL (ref 12.0–15.0)
Immature Granulocytes: 0 %
Lymphocytes Relative: 39 %
Lymphs Abs: 2.9 10*3/uL (ref 0.7–4.0)
MCH: 30.8 pg (ref 26.0–34.0)
MCHC: 32.3 g/dL (ref 30.0–36.0)
MCV: 95.3 fL (ref 80.0–100.0)
Monocytes Absolute: 0.9 10*3/uL (ref 0.1–1.0)
Monocytes Relative: 12 %
Neutro Abs: 3.3 10*3/uL (ref 1.7–7.7)
Neutrophils Relative %: 46 %
Platelets: 318 10*3/uL (ref 150–400)
RBC: 4.06 MIL/uL (ref 3.87–5.11)
RDW: 11.9 % (ref 11.5–15.5)
WBC: 7.3 10*3/uL (ref 4.0–10.5)
nRBC: 0 % (ref 0.0–0.2)

## 2018-11-30 LAB — COMPREHENSIVE METABOLIC PANEL
ALT: 17 U/L (ref 0–44)
AST: 19 U/L (ref 15–41)
Albumin: 3.8 g/dL (ref 3.5–5.0)
Alkaline Phosphatase: 84 U/L (ref 38–126)
Anion gap: 10 (ref 5–15)
BUN: 18 mg/dL (ref 8–23)
CO2: 28 mmol/L (ref 22–32)
Calcium: 9 mg/dL (ref 8.9–10.3)
Chloride: 101 mmol/L (ref 98–111)
Creatinine, Ser: 0.89 mg/dL (ref 0.44–1.00)
GFR calc Af Amer: >60 mL/min (ref >60)
GFR calc non Af Amer: >60 mL/min (ref >60)
Glucose, Bld: 79 mg/dL (ref 70–99)
Potassium: 4.2 mmol/L (ref 3.5–5.1)
Sodium: 139 mmol/L (ref 135–145)
Total Bilirubin: 0.4 mg/dL (ref 0.3–1.2)
Total Protein: 7.2 g/dL (ref 6.5–8.1)

## 2018-12-01 LAB — MULTIPLE MYELOMA PANEL, SERUM
Albumin SerPl Elph-Mcnc: 3.5 g/dL (ref 2.9–4.4)
Albumin/Glob SerPl: 1.2 (ref 0.7–1.7)
Alpha 1: 0.2 g/dL (ref 0.0–0.4)
Alpha2 Glob SerPl Elph-Mcnc: 0.7 g/dL (ref 0.4–1.0)
B-Globulin SerPl Elph-Mcnc: 1.8 g/dL — ABNORMAL HIGH (ref 0.7–1.3)
Gamma Glob SerPl Elph-Mcnc: 0.3 g/dL — ABNORMAL LOW (ref 0.4–1.8)
Globulin, Total: 3 g/dL (ref 2.2–3.9)
IgA: 14 mg/dL — ABNORMAL LOW (ref 64–422)
IgG (Immunoglobin G), Serum: 341 mg/dL — ABNORMAL LOW (ref 586–1602)
IgM (Immunoglobulin M), Srm: 1598 mg/dL — ABNORMAL HIGH (ref 26–217)
M Protein SerPl Elph-Mcnc: 1 g/dL — ABNORMAL HIGH
Total Protein ELP: 6.5 g/dL (ref 6.0–8.5)

## 2018-12-04 NOTE — Progress Notes (Signed)
[facility]  [phone] [phone]     ID: AVEREY KONING DOB: 1940-04-13  MR#: [id number]  JZP#:[id number]  Patient Care Team: Mellody Dance, DO as PCP - General (Family Medicine) Melrose Nakayama, MD as Attending Physician (Orthopedic Surgery) Rini Moffit, Virgie Dad, MD (Hematology and Oncology) Clarene Essex, MD (Gastroenterology) Rutherford Guys, MD as Attending Physician (Ophthalmology) Jovita Gamma, MD as Consulting Physician (Neurosurgery) Minus Breeding, MD as Consulting Physician (Cardiology) Levy Sjogren, MD as Referring Physician (Dermatology) Irine Seal, MD as Attending Physician (Urology) Mosetta Anis, MD as Referring Physician (Allergy) Elam Dutch, MD as Consulting Physician (Vascular Surgery) OTHER MD: Minus Breeding MD, Marshell Garfinkel MD  CHIEF COMPLAINT: M-GUS/ low-grade lymphoproliferative process  CURRENT TREATMENT: IVIG as needed   [place] returns today for 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   Since her last visit, she underwent bilateral screening mammography at [facility] on 11/14/2018 showing: breast density category C; no evidence of malignancy in either breast.  She also underwent bone density testing the same day. This showed a T-score of -2.4, which is considered osteopenic.  We are following her M-protein and her total IgG: The M protein is stable but the IgG has continued to decline.  She will need some IVIG before the end of the month  REVIEW OF SYSTEMS: Joycelyn Schmid tells me she has had a cold that she "cannot shake.".  She has been very fatigued and barely able to work in her garden or cleaning her yard.  She had a break-in and boarded up her front door.  She has not been able to get a new front door because of manufacturing issues.  A detailed review of systems today was otherwise stable.   HISTORY PRESENT ILLNESS::  from the earlier summary  note:  "Joycelyn Schmid" Blunt is a 78 y/o [place] woman I evaluated originally in October of 2004 for a monoclonal gammopathy. At that time she had a white cell count of 7.2, hemoglobin 12.8, MCV 94.5, and platelets 298,000. Protein workup showed an IgM kappa paraprotein of 0.6 g, with a total IgG slightly depressed at 556, but a normal IgA at 82. Because an IgM paraprotein can be associated with a low grade lymphoproliferative process (Waldenstrm's macroglobulinemia) [name] underwent bone marrow biopsy 11/27/2002, which showed ([id number]) a normal cellular marrow with trilineage hematopoiesis and 5% plasma cells. The peripheral blood film showed no significant abnormalities and on the biopsy para-spicular lymphoid aggregates were not noted. 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  The patient was followed with observation alone through May of 2009, with no evidence of progression in her IgM kappa clone. As of 06/03/2007 her total IgM spike was 0.83, her IgG was 490 and her IgA was 36. The patient was released from followup here at that time.  More recently, as the 10 year anniversary of her original bone marrow biopsy past, Dr. Dorann Lodge of referred the patient back for reassessment and possible bone marrow rebiopsy. Joycelyn Schmid was scheduled here for lab work 09/25/2013. However when she had her blood drawn she complained of some shortness of breath and her pulse was checked. It was 33. The patient was referred to the emergency room where she was evaluated by Dr. Percival Spanish who felt the patient's true heart rate was higher (she has ectopic beats which generated a week pulse and may have been missed) since in the emergency room EKG her rate was 98 with normal sinus rhythm.  Troponins were negative. BNP was slightly elevated.  Dr. Percival Spanish set the patient up for an echocardiogram 09/27/2013 and this showed an ejection fraction of 30-35%. There was diffuse hypokinesis. She  underwent right and left heart catheterization 10/04/2013 which showed no significant obstructions. The study did confirm an ejection fraction of roughly 35% with global hypokinesis. She is followed by Dr. Percival Spanish for her new diagnosis of non-ischemic cardiomyopathy.  Her subsequent history is as detailed below   PAST MEDICAL HISTORY: Past Medical History:  Diagnosis Date   Allergy    CAD (coronary artery disease)    Nonobstructive. LAD 30% stenosis, small PDA of the circumflex 70% stenosis, right coronary artery 40% stenosis. 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/03/2010   MUSCLE SPASM 03/03/2010   Nonischemic cardiomyopathy ([place])    EF 30%   OSTEOARTHRITIS 08/29/2006   OSTEOPENIA 11/19/2008   PARESTHESIA 03/05/2009   PVC's (premature ventricular contractions)    Scoliosis    VARICOSE VEINS, LOWER EXTREMITIES 08/29/2006    PAST SURGICAL HISTORY: Past Surgical History:  Procedure Laterality Date   ABDOMINAL HYSTERECTOMY  1981   APPENDECTOMY     BACK SURGERY  03-22-2008   lumb fusion   BUNIONECTOMY     both   BUNIONECTOMY Right 1985   CARPAL TUNNEL RELEASE     both   CARPAL TUNNEL RELEASE Right 1992   CARPAL TUNNEL RELEASE Left 1992   CATARACT EXTRACTION     both   CATARACT EXTRACTION Right March 22, 2006   CATARACT EXTRACTION Left 03/22/06   HAMMER TOE SURGERY Left 1987   HAMMER TOE SURGERY Right 1986   LEFT AND RIGHT HEART CATHETERIZATION WITH CORONARY ANGIOGRAM N/A 10/04/2013   Procedure: LEFT AND RIGHT HEART CATHETERIZATION WITH CORONARY ANGIOGRAM;  Surgeon: Leonie Man, MD;  Location[facility] C[facility] LAB;  Service: Cardiovascular;  Laterality: N/A;   LUMBAR FUSION  03-22-04   LUMBAR FUSION  03/22/08   OOPHORECTOMY  1967   OVARIAN CYST REMOVAL     ROTATOR  CUFF REPAIR Right March 23, 2011   SHOULDER ARTHROSCOPY  02/09/2011   Procedure: ARTHROSCOPY SHOULDER;  Surgeon: Hessie Dibble, MD;  Location: [facility];  Service: Orthopedics;  Laterality: Right;  right shoulder arthroscopy subacromial decompression with rotator cuff repair   TONSILLECTOMY      FAMILY HISTORY Family History  Problem Relation Age of Onset   Stroke Mother    Cancer Father    Emphysema Father    Hypertension Other    Breast cancer Neg Hx    the patient's father died at the age of 6 from lung cancer in the setting of tobacco abuse. [name] mother died at the age of 6 following a stroke. The patient had one brother, who is severely retarded and died from pneumonia at the age of 78. There were no sisters. There is no history of blood problems or cancer in the family to the patient's knowledge   GYNECOLOGIC HISTORY:  No LMP recorded. Patient is postmenopausal. Menarche age 70. The patient is GX P0. She stopped having periods in 22-Mar-1978 and took hormone replacement for almost 20 years.   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 Her husband died in 03/23/1991 from a myocardial infarction.  She tells me she has no family.  She lives by herself, in [place] acres, with one cat for company.    ADVANCED DIRECTIVES: Her healthcare power of attorney is Charm Barges who can be reached at [phone] (cell), or [phone] (home).  Also if Baldo Ash had an acute problem she would want [place] to contact her neighbor and third cousin Andrey Cota at [place]:  Social History   Tobacco Use   Smoking status: Never Smoker   Smokeless tobacco: Never Used  Substance Use Topics   Alcohol use: No    Alcohol/week: 0.0 standard drinks   Drug use: No     Colonoscopy: 2014  PAP: 2013  Bone density: 11/2018, -2.4  Lipid panel:  Allergies  Allergen Reactions   Acetaminophen Rash    REACTION: high dose  - tremor Per pt - rash on chest and heart palpitations   Alendronate Sodium     Stomach upset.    Codeine     May interfere with heart condition     Contrast Media [Iodinated Diagnostic Agents]     Syncope.    Diflunisal     Stomach pain.     Doxycycline     n/v   Flexeril [Cyclobenzaprine]     Abdominal pain.     Nabumetone     Abdominal pain; dark, tarry stool.     Oxybutynin     Pt does not remember.     Raloxifene     Unknown.     Sulfa Antibiotics Nausea Only    Syncope.     Symbicort [Budesonide-Formoterol Fumarate]     "Jittery-ness."    Tramadol Hcl     Numbness.     Diclofenac Palpitations    Dark, tarry stool.     Latex Rash    Current Outpatient Medications  Medication Sig Dispense Refill   carvedilol (COREG) 12.5 MG tablet TAKE 1 TABLET BY MOUTH 2 TIMES DAILY. 180 tablet 3   Cholecalciferol (VITAMIN D3) 50 MCG (2000 UT) capsule Take 1 capsule by mouth daily.      cimetidine (TAGAMET) 400 MG tablet Take 1 tablet (400 mg total) by mouth 2 (two) times daily. 180 tablet 1   cromolyn (NASALCROM) 5.2 MG/ACT nasal spray Place 1 spray into both nostrils 4 (four) times daily.     Glucosamine-Chondroitin 1500-1200 MG/30ML LIQD Take 1 tablet by mouth daily.      loratadine (CLARITIN) 10 MG tablet Take 10 mg by mouth daily.     losartan (COZAAR) 25 MG tablet TAKE 1 TABLET BY MOUTH DAILY. 90 tablet 0   pantoprazole (PROTONIX) 40 MG tablet Take 40 mg by mouth daily.     Polyethyl Glycol-Propyl Glycol (SYSTANE ULTRA) 0.4-0.3 % SOLN Apply to eye. 2 drops in each eye 3 to 4 times daily     SALINE NASAL SPRAY NA Place into the nose. 2 sprays each nostril every 4 hours     sucralfate (CARAFATE) 1 g tablet Take 1 g by mouth daily as needed.      SYNTHROID 25 MCG tablet Take 1 tablet (25 mcg total) by mouth daily before breakfast. 90 tablet 1   No current facility-administered medications for this visit.     OBJECTIVE: Older white woman who appears  stated age  55:   12/05/18 1118  BP: (!) 150/66  Pulse: 65  Resp: 18  Temp: 98.7 F (37.1 C)  SpO2: 100%     Body mass index is 21.63  kg/m.    ECOG FS:2 - Symptomatic, <50% confined to bed  Sclerae unicteric, EOMs intact Wearing a mask No cervical or supraclavicular adenopathy Lungs no rales or rhonchi Heart regular rate and rhythm Abd soft, nontender, positive bowel sounds MSK no focal spinal tenderness, significant hand joint deformities bilaterally Neuro: nonfocal, well oriented, appropriate affect Breasts: No masses or discharge in either breast.  Both axillae are benign.   LAB RESULTS: The M protein is stable at 1.0.  However the total IgG continues to drop, now at less than 400  CMP     Component Value Date/Time   NA 139 11/30/2018 1033   NA 137 10/23/2018 1110   NA 136 11/04/2016 1115   K 4.2 11/30/2018 1033   K 4.8 11/04/2016 1115   CL 101 11/30/2018 1033   CO2 28 11/30/2018 1033   CO2 27 11/04/2016 1115   GLUCOSE 79 11/30/2018 1033   GLUCOSE 95 11/04/2016 1115   BUN 18 11/30/2018 1033   BUN 19 10/23/2018 1110   BUN 16.7 11/04/2016 1115   CREATININE 0.89 11/30/2018 1033   CREATININE 0.8 11/04/2016 1115   CALCIUM 9.0 11/30/2018 1033   CALCIUM 9.0 11/04/2016 1115   PROT 7.2 11/30/2018 1033   PROT 6.6 10/23/2018 1110   PROT 6.6 11/04/2016 1115   ALBUMIN 3.8 11/30/2018 1033   ALBUMIN 4.0 10/23/2018 1110   ALBUMIN 3.5 11/04/2016 1115   AST 19 11/30/2018 1033   AST 23 11/04/2016 1115   ALT 17 11/30/2018 1033   ALT 18 11/04/2016 1115   ALKPHOS 84 11/30/2018 1033   ALKPHOS 82 11/04/2016 1115   BILITOT 0.4 11/30/2018 1033   BILITOT 0.4 10/23/2018 1110   BILITOT 0.40 11/04/2016 1115   GFRNONAA >60 11/30/2018 1033   GFRNONAA 69 10/02/2013 1234   GFRAA >60 11/30/2018 1033   GFRAA 80 10/02/2013 1234    I No results found for: SPEP  Lab Results  Component Value Date   WBC 7.3 11/30/2018   NEUTROABS 3.3 11/30/2018   HGB 12.5 11/30/2018   HCT  38.7 11/30/2018   MCV 95.3 11/30/2018   PLT 318 11/30/2018      Chemistry      Component Value Date/Time   NA 139 11/30/2018 1033   NA 137 10/23/2018 1110   NA 136 11/04/2016 1115   K 4.2 11/30/2018 1033   K 4.8 11/04/2016 1115   CL 101 11/30/2018 1033   CO2 28 11/30/2018 1033   CO2 27 11/04/2016 1115   BUN 18 11/30/2018 1033   BUN 19 10/23/2018 1110   BUN 16.7 11/04/2016 1115   CREATININE 0.89 11/30/2018 1033   CREATININE 0.8 11/04/2016 1115   GLU 89 04/08/2016      Component Value Date/Time   CALCIUM 9.0 11/30/2018 1033   CALCIUM 9.0 11/04/2016 1115   ALKPHOS 84 11/30/2018 1033   ALKPHOS 82 11/04/2016 1115   AST 19 11/30/2018 1033   AST 23 11/04/2016 1115   ALT 17 11/30/2018 1033   ALT 18 11/04/2016 1115   BILITOT 0.4 11/30/2018 1033   BILITOT 0.4 10/23/2018 1110   BILITOT 0.40 11/04/2016 1115       No results found for: LABCA2  No components found for: LABCA125  No results for input(s): INR in the last 168 hours.  Urinalysis    Component Value Date/Time   COLORURINE LT. YELLOW 06/08/2011 0830   APPEARANCEUR CLEAR 06/08/2011 0830   LABSPEC 1.010 06/08/2011 0830   PHURINE 6.5 06/08/2011 0830  GLUCOSEU Negative 10/23/2018 1047   GLUCOSEU NEGATIVE 06/08/2011 0830   HGBUR NEGATIVE 06/08/2011 0830   BILIRUBINUR neg 10/23/2018 1047   KETONESUR NEGATIVE 06/08/2011 0830   PROTEINUR NEGATIVE 04/19/2008 1216   UROBILINOGEN 0.2 10/23/2018 1047   UROBILINOGEN 0.2 06/08/2011 0830   NITRITE negative 10/23/2018 1047   NITRITE NEGATIVE 06/08/2011 0830   LEUKOCYTESUR Negative 10/23/2018 1047    STUDIES: Dg Bone Density  Result Date: 11/14/2018 EXAM: DUAL X-RAY ABSORPTIOMETRY (DXA) FOR BONE MINERAL DENSITY IMPRESSION: Referring Physician:  Mellody Dance Your patient completed a BMD test using Lunar IDXA DXA system ( analysis version: 16 ) manufactured by [employer]. Technologist: CG PATIENT: Name: Adryana, Mogensen Patient ID: [id number] Birth Date:  [birth year] Height: 64.0 in. Sex: Female Measured: 11/14/2018 Weight: 126.2 lbs. Indications: Advanced Age, Bilateral Ovariectomy (65.51), Caucasian, Estrogen Deficient, Hypothyroid, Hysterectomy, Pantoprazole, Postmenopausal, scoliosis, Synthroid Fractures: None Treatments: Vitamin D (E933.5) ASSESSMENT: The BMD measured at Forearm Radius 33% is 0.675 g/cm2 with a T-score of -2.4. This patient is considered osteopenic according to [facility]) criteria. The scan quality is good. Lumbar spine was not utilized due to surgical hardware. Site Region Measured Date Measured Age YA BMD Significant CHANGE T-score Left Forearm Radius 33% 11/14/2018 78.4 -2.4 0.675 g/cm2 DualFemur Total Right 11/14/2018 78.4 -1.6 0.808 g/cm2 DualFemur Total Mean 11/14/2018 78.4 -1.4 0.827 g/cm2 World Health Organization [facility]) criteria for post-menopausal, Caucasian Women: Normal       T-score at or above -1 SD Osteopenia   T-score between -1 and -2.5 SD Osteoporosis T-score at or below -2.5 SD RECOMMENDATION: 1. All patients should optimize calcium and vitamin D intake. 2. Consider FDA approved medical therapies in postmenopausal women and men aged 85 years and older, based on the following: a. A hip or vertebral (clinical or morphometric) fracture b. T- score < or = -2.5 at the femoral neck or spine after appropriate evaluation to exclude secondary causes c. Low bone mass (T-score between -1.0 and -2.5 at the femoral neck or spine) and a 10 year probability of a hip fracture > or = 3% or a 10 year probability of a major osteoporosis-related fracture > or = 20% based on the [place]-adapted WHO algorithm d. Clinician judgment and/or patient preferences may indicate treatment for people with 10-year fracture probabilities above or below these levels FOLLOW-UP: Patients with diagnosis of osteoporosis or at high risk for fracture should have regular bone mineral density tests. For patients eligible for Medicare, routine testing is  allowed once every 2 years. The testing frequency can be increased to one year for patients who have rapidly progressing disease, those who are receiving or discontinuing medical therapy to restore bone mass, or have additional risk factors. [facility] FRAX* 10-year Probability of Fracture Based on femoral neck BMD: DualFemur (Right) Major Osteoporotic Fracture: 11.5% Hip Fracture:                2.7% Population:                  [place] (Caucasian) Risk Factors:                None *FRAX is a Materials engineer of the [employer] of [employer] for Metabolic Bone Disease, a World Pharmacologist (WHO) [facility]. ASSESSMENT: The probability of a major osteoporotic fracture is 11.5% within the next ten years. The probability of a hip fracture is 2.7% within the next ten years. Electronically Signed   By: Rolm Baptise M.D.  On: 11/14/2018 15:11   Mm Digital Screening Bilateral  Result Date: 11/15/2018 CLINICAL DATA:  Screening. EXAM: DIGITAL SCREENING BILATERAL MAMMOGRAM WITH CAD COMPARISON:  Previous exam(s). ACR Breast Density Category c: The breast tissue is heterogeneously dense, which may obscure small masses. FINDINGS: There are no findings suspicious for malignancy. Images were processed with CAD. IMPRESSION: No mammographic evidence of malignancy. A result letter of this screening mammogram will be mailed directly to the patient. RECOMMENDATION: Screening mammogram in one year. (Code:[id number]-B-01Y) BI-RADS CATEGORY  1: Negative. Electronically Signed   By: Claudie Revering M.D.   On: 11/15/2018 12:50    ASSESSMENT: 78 y.o. 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 When it causes amyloidosis of the heart, which is quite rare, the pattern is restrictive. 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 In case of an infection, IVIG supplementation should be considered  (a) received IVIG October 2016  (b) repeated IVIG October 2018  (c) repeated November 2019 and November 2020  PLAN:  [name]'s M spike is very stable and there is no suggestion of progression to multiple myeloma.  This is favorable and requires only follow-up.  Her immunoglobulins however continue to drop and they are now less than 400.  This is not safe for her particularly through the winter and particularly in the pandemic.  This is the reason she "cannot shake her cold".  Accordingly we have set her up for IVIG next week.  I think that will help carry her through the winter.  She will have repeat lab work in 3 months in 6 months and see me again in 6 months.  At that point I will consider very low-dose prednisone for her, perhaps 1 or 2 mg daily.  I do not want to do anything else that might however affect her immune system while we are in a pandemic  She knows to call for any other issue that may develop before the next visit.   Dailan Pfalzgraf, Virgie Dad, MD  12/05/18 11:52 AM Medical Oncology and Hematology [facility] [place], [place] [address] Tel. [phone]    Fax. [phone]    I, Wilburn Mylar, am acting as scribe for Dr. Virgie Dad. Brinlee Gambrell.  I, Lurline Del MD, have reviewed the above documentation for accuracy and completeness, and I agree with the above.

## 2018-12-05 ENCOUNTER — Inpatient Hospital Stay: Payer: Medicare Other | Attending: Oncology | Admitting: Oncology

## 2018-12-05 ENCOUNTER — Other Ambulatory Visit: Payer: Self-pay

## 2018-12-05 VITALS — BP 150/66 | HR 65 | Temp 98.7°F | Resp 18 | Ht 64.0 in | Wt 126.0 lb

## 2018-12-05 DIAGNOSIS — Z8249 Family history of ischemic heart disease and other diseases of the circulatory system: Secondary | ICD-10-CM | POA: Insufficient documentation

## 2018-12-05 DIAGNOSIS — Z79899 Other long term (current) drug therapy: Secondary | ICD-10-CM | POA: Insufficient documentation

## 2018-12-05 DIAGNOSIS — I10 Essential (primary) hypertension: Secondary | ICD-10-CM | POA: Insufficient documentation

## 2018-12-05 DIAGNOSIS — D801 Nonfamilial hypogammaglobulinemia: Secondary | ICD-10-CM | POA: Insufficient documentation

## 2018-12-05 DIAGNOSIS — Z801 Family history of malignant neoplasm of trachea, bronchus and lung: Secondary | ICD-10-CM | POA: Insufficient documentation

## 2018-12-05 DIAGNOSIS — D472 Monoclonal gammopathy: Secondary | ICD-10-CM | POA: Diagnosis not present

## 2018-12-05 DIAGNOSIS — I428 Other cardiomyopathies: Secondary | ICD-10-CM | POA: Insufficient documentation

## 2018-12-05 DIAGNOSIS — M8580 Other specified disorders of bone density and structure, unspecified site: Secondary | ICD-10-CM | POA: Insufficient documentation

## 2018-12-05 DIAGNOSIS — D471 Chronic myeloproliferative disease: Secondary | ICD-10-CM

## 2018-12-06 ENCOUNTER — Telehealth: Payer: Self-pay | Admitting: Oncology

## 2018-12-06 NOTE — Telephone Encounter (Signed)
I talk with patient regarding schedule  

## 2018-12-07 ENCOUNTER — Ambulatory Visit (INDEPENDENT_AMBULATORY_CARE_PROVIDER_SITE_OTHER): Payer: Medicare Other | Admitting: Family Medicine

## 2018-12-07 ENCOUNTER — Other Ambulatory Visit: Payer: Self-pay

## 2018-12-07 ENCOUNTER — Encounter: Payer: Self-pay | Admitting: Family Medicine

## 2018-12-07 VITALS — BP 127/72 | HR 56 | Ht 64.0 in | Wt 126.0 lb

## 2018-12-07 DIAGNOSIS — M779 Enthesopathy, unspecified: Secondary | ICD-10-CM | POA: Diagnosis not present

## 2018-12-07 DIAGNOSIS — M8580 Other specified disorders of bone density and structure, unspecified site: Secondary | ICD-10-CM | POA: Diagnosis not present

## 2018-12-07 DIAGNOSIS — Z780 Asymptomatic menopausal state: Secondary | ICD-10-CM | POA: Insufficient documentation

## 2018-12-07 DIAGNOSIS — I2510 Atherosclerotic heart disease of native coronary artery without angina pectoris: Secondary | ICD-10-CM | POA: Diagnosis not present

## 2018-12-07 DIAGNOSIS — Z8249 Family history of ischemic heart disease and other diseases of the circulatory system: Secondary | ICD-10-CM

## 2018-12-07 DIAGNOSIS — K5730 Diverticulosis of large intestine without perforation or abscess without bleeding: Secondary | ICD-10-CM

## 2018-12-07 NOTE — Patient Instructions (Signed)
Preventing Osteoporosis, Adult Osteoporosis is a condition that causes the bones to get weaker. With osteoporosis, the bones become thinner, and the normal spaces in bone tissue become larger. This can make the bones weak and cause them to break more easily. People who have osteoporosis are more likely to break their wrist, spine, or hip. Even a minor accident or injury can be enough to break weak bones. Osteoporosis can occur with aging. Your body constantly replaces old bone tissue with new tissue. As you get older, you may lose bone tissue more quickly, or it may be replaced more slowly. Osteoporosis is more likely to develop if you have poor nutrition or do not get enough calcium or vitamin D. Other lifestyle factors can also play a role. By making some diet and lifestyle changes, you can help to keep your bones healthy and help to prevent osteoporosis. What nutrition changes can be made? Nutrition plays an important role in maintaining healthy, strong bones.  Make sure you get enough calcium every day from food or from calcium supplements. ? If you are age 64 or younger, aim to get 1,000 mg of calcium every day. ? If you are older than age 31, aim to get 1,200 mg of calcium every day.  Try to get enough vitamin D every day. ? If you are age 42 or younger, aim to get 600 international units (IU) every day. ? If you are older than age 56, aim to get 800 international units every day.  Follow a healthy diet. Eat plenty of foods that contain calcium and vitamin D. ? Calcium is in milk, cheese, yogurt, and other dairy products. Some fish and vegetables are also good sources of calcium. Many foods such as cereals and breads have had calcium added to them (are fortified). Check nutrition labels to see how much calcium is in a food or drink. ? Foods that contain vitamin D include milk, cereals, salmon, and tuna. Your body also makes vitamin D when you are out in the sun. Bare skin exposure to the sun on  your face, arms, legs, or back for no more than 30 minutes a day, 2 times per week is more than enough. Beyond that, it is important to use sunblock to protect your skin from sunburn, which increases your risk for skin cancer. What lifestyle changes can be made? Making changes in your everyday life can also play an important role in preventing osteoporosis.  Stay active and get exercise every day. Ask your health care provider what types of exercise are best for you.  Do not use any products that contain nicotine or tobacco, such as cigarettes and e-cigarettes. If you need help quitting, ask your health care provider.  Limit alcohol intake to no more than 1 drink a day for nonpregnant women and 2 drinks a day for men. One drink equals 12 oz of beer, 5 oz of wine, or 1 oz of hard liquor. Why are these changes important? Making these nutrition and lifestyle changes can:  Help you develop and maintain healthy, strong bones.  Prevent loss of bone mass and the problems that are caused by that loss, such as broken bones and delayed healing.  Make you feel better mentally and physically. What can happen if changes are not made? Problems that can result from osteoporosis can be very serious. These may include:  A higher risk of broken bones that are painful and do not heal well.  Physical malformations, such as a collapsed spine  or a hunched back.  Problems with movement. Where to find support If you need help making changes to prevent osteoporosis, talk with your health care provider. You can ask for a referral to a diet and nutrition specialist (dietitian) and a physical therapist. Where to find more information Learn more about osteoporosis from:  NIH Osteoporosis and Related [place]: www.niams.GolfingGoddess.com.br  [place] Office on Women's Health:  SouvenirBaseball.es.html  National Osteoporosis Foundation: ProfilePeek.ch Summary  Osteoporosis is a condition that causes weak bones that are more likely to break.  Eating a healthy diet and making sure you get enough calcium and vitamin D can help prevent osteoporosis.  Other ways to reduce your risk of osteoporosis include getting regular exercise and avoiding alcohol and products that contain nicotine or tobacco. This information is not intended to replace advice given to you by your health care provider. Make sure you discuss any questions you have with your health care provider. Document Released: 02/02/2015 Document Revised: 12/31/2016 Document Reviewed: 09/29/2015 Elsevier Patient Education  2020 [place] protect organs, store calcium, anchor muscles, and support the whole body. Keeping your bones strong is important, especially as you get older. You can take actions to help keep your bones strong and healthy. Why is keeping my bones healthy important?  Keeping your bones healthy is important because your body constantly replaces bone cells. Cells get old, and new cells take their place. As we age, we lose bone cells because the body may not be able to make enough new cells to replace the old cells. The amount of bone cells and bone tissue you have is referred to as bone mass. The higher your bone mass, the stronger your bones. The aging process leads to an overall loss of bone mass in the body, which can increase the likelihood of:  Joint pain and stiffness.  Broken bones.  A condition in which the bones become weak and brittle (osteoporosis). A large decline in bone mass occurs in older adults. In women, it occurs about the time of menopause. What actions can I take to keep my bones healthy? Good health habits are important for maintaining healthy bones. This  includes eating nutritious foods and exercising regularly. To have healthy bones, you need to get enough of the right minerals and vitamins. Most nutrition experts recommend getting these nutrients from the foods that you eat. In some cases, taking supplements may also be recommended. Doing certain types of exercise is also important for bone health. What are the nutritional recommendations for healthy bones?  Eating a well-balanced diet with plenty of calcium and vitamin D will help to protect your bones. Nutritional recommendations vary from person to person. Ask your health care provider what is healthy for you. Here are some general guidelines. Get enough calcium Calcium is the most important (essential) mineral for bone health. Most people can get enough calcium from their diet, but supplements may be recommended for people who are at risk for osteoporosis. Good sources of calcium include:  Dairy products, such as low-fat or nonfat milk, cheese, and yogurt.  Dark green leafy vegetables, such as bok choy and broccoli.  Calcium-fortified foods, such as orange juice, cereal, bread, soy beverages, and tofu products.  Nuts, such as almonds. Follow these recommended amounts for daily calcium intake:  Children, age 233-3: 700 mg.  Children, age 23-8: 1,000 mg.  Children, age 232-13: 1,300 mg.  Teens, age 39-18: 1,300 mg.  Adults,  age 36-50: 1,000 mg.  Adults, age 7-70: ? Men: 1,000 mg. ? Women: 1,200 mg.  Adults, age 63 or older: 1,200 mg.  Pregnant and breastfeeding females: ? Teens: 1,300 mg. ? Adults: 1,000 mg. Get enough vitamin D Vitamin D is the most essential vitamin for bone health. It helps the body absorb calcium. Sunlight stimulates the skin to make vitamin D, so be sure to get enough sunlight. If you live in a cold climate or you do not get outside often, your health care provider may recommend that you take vitamin D supplements. Good sources of vitamin D in your diet  include:  Egg yolks.  Saltwater fish.  Milk and cereal fortified with vitamin D. Follow these recommended amounts for daily vitamin D intake:  Children and teens, age 51-18: 600 international units.  Adults, age 64 or younger: 400-800 international units.  Adults, age 58 or older: 800-1,000 international units. Get other important nutrients Other nutrients that are important for bone health include:  Phosphorus. This mineral is found in meat, poultry, dairy foods, nuts, and legumes. The recommended daily intake for adult men and adult women is 700 mg.  Magnesium. This mineral is found in seeds, nuts, dark green vegetables, and legumes. The recommended daily intake for adult men is 400-420 mg. For adult women, it is 310-320 mg.  Vitamin K. This vitamin is found in green leafy vegetables. The recommended daily intake is 120 mg for adult men and 90 mg for adult women. What type of physical activity is best for building and maintaining healthy bones? Weight-bearing and strength-building activities are important for building and maintaining healthy bones. Weight-bearing activities cause muscles and bones to work against gravity. Strength-building activities increase the strength of the muscles that support bones. Weight-bearing and muscle-building activities include:  Walking and hiking.  Jogging and running.  Dancing.  Gym exercises.  Lifting weights.  Tennis and racquetball.  Climbing stairs.  Aerobics. Adults should get at least 30 minutes of moderate physical activity on most days. Children should get at least 60 minutes of moderate physical activity on most days. Ask your health care provider what type of exercise is best for you. How can I find out if my bone mass is low? Bone mass can be measured with an X-ray test called a bone mineral density (BMD) test. This test is recommended for all women who are age 23 or older. It may also be recommended for:  Men who are age 9  or older.  People who are at risk for osteoporosis because of: ? Having bones that break easily. ? Having a long-term disease that weakens bones, such as kidney disease or rheumatoid arthritis. ? Having menopause earlier than normal. ? Taking medicine that weakens bones, such as steroids, thyroid hormones, or hormone treatment for breast cancer or prostate cancer. ? Smoking. ? Drinking three or more alcoholic drinks a day. If you find that you have a low bone mass, you may be able to prevent osteoporosis or further bone loss by changing your diet and lifestyle. Where can I find more information? For more information, check out the following websites:  [place]: AviationTales.fr  [employer] of Health: www.bones.SouthExposed.es  International Osteoporosis Foundation: Administrator.iofbonehealth.org Summary  The aging process leads to an overall loss of bone mass in the body, which can increase the likelihood of broken bones and osteoporosis.  Eating a well-balanced diet with plenty of calcium and vitamin D will help to protect your bones.  Weight-bearing and  strength-building activities are also important for building and maintaining strong bones.  Bone mass can be measured with an X-ray test called a bone mineral density (BMD) test. This information is not intended to replace advice given to you by your health care provider. Make sure you discuss any questions you have with your health care provider. Document Released: 04/10/2003 Document Revised: 02/14/2017 Document Reviewed: 02/14/2017 Elsevier Patient Education  2020 [place] for Osteoporosis Osteoporosis causes your bones to become weak and brittle. This puts you at greater risk for bone breaks (fractures) from small bumps or falls. Making changes to your diet and increasing your physical activity can help strengthen your bones and improve your overall health. Calcium and vitamin D are  nutrients that play an important role in bone health. Vitamin D helps your body use calcium and strengthen bones. Therefore, it is important to get enough calcium and vitamin D as part of your eating plan for osteoporosis. What are tips for following this plan? Reading food labels  Try to get at least 1,000 milligrams (mg) of calcium each day.  Look for foods that have at least 50 mg of calcium per serving.  Talk with your health care provider about taking a calcium supplement if you do not get enough calcium from food.  Do not have more than 2,500 mg of calcium each day. This is the upper limit for food and nutritional supplements combined. Too much calcium may cause constipation and prevent you from absorbing other important nutrients.  Choose foods that contain vitamin D.  Take a daily vitamin supplement that contains 800-1,000 international units (IU) of vitamin D. The amount may be different depending on your age, body weight, ethnicity, and where you live. Talk with your dietitian or health care provider about how much vitamin D is right for you.  Avoid foods that have more than 300 mg of sodium per serving. Too much sodium can cause your body to lose calcium.  Talk with your dietitian or health care provider about how much sodium you are allowed each day. Shopping  Do not buy foods with added salt, including: ? Salted snacks. ? Angie Fava. ? Canned soups. ? Canned meats. ? Processed meats, such as bacon or cold cuts. ? Smoked fish. Meal planning  Eat balanced meals that contain protein foods, fruits and vegetables, and foods rich in calcium and vitamin D.  Eat at least 5 servings of fruits and vegetables each day.  Eat 5-6 oz. of lean meat, poultry, fish, eggs, or beans each day. Lifestyle  Do not use any products that contain nicotine or tobacco, such as cigarettes and e-cigarettes. If you need help quitting, ask your health care provider.  If your health care provider  recommends that you lose weight: ? Work with a dietitian to develop an eating plan that will help you reach your desired weight goal. ? Exercise for at least 30 minutes a day, 5 or more days a week, or as told by your health care provider.  Work with a physical therapist to develop an exercise plan that includes flexibility, balance, and strength exercises.  If you drink alcohol, limit how much you have. This means: ? 0-1 drink a day for women. ? 0-2 drinks a day for men. ? Be aware of how much alcohol is in your drink. In the [place] one drink equals one typical bottle of beer (12 oz), one-half glass of wine (5 oz), or one shot of hard  liquor (1 oz). What foods should I eat? Foods high in calcium   Yogurt. Yogurt with fruit.  Milk. Evaporated skim milk. Dry milk powder.  Calcium-fortified orange juice.  Parmesan cheese. Part-skim ricotta cheese. Natural hard cheese. Cream cheese. Cottage cheese.  Canned sardines. Canned salmon.  Calcium-treated tofu. Calcium-fortified cereal bar. Calcium-fortified cereal. Calcium-fortified graham crackers.  Cooked collard greens. Turnip greens. Broccoli. Kale.  Almonds.  White beans.  Corn tortilla. Foods high in vitamin D  Cod liver oil. Fatty fish, such as tuna, mackerel, and salmon.  Milk. Fortified soy milk. Fortified fruit juice.  Yogurt. Margarine.  Egg yolks. Foods high in protein  Beef. Lamb. Pork tenderloin.  Chicken breast.  Tuna (canned). Fish fillet.  Tofu.  Soy beans (cooked). Soy patty. Beans (canned or cooked).  Cottage cheese.  Yogurt.  Peanut butter.  Pumpkin seeds. Nuts. Sunflower seeds.  Hard cheese.  Milk or other milk products, such as soy milk. The items listed above may not be a complete list of foods and beverages you can eat. Contact a dietitian for more options. Summary  Calcium and vitamin D are nutrients that play an important role in bone health and are an important part of your eating  plan for osteoporosis.  Eat balanced meals that contain protein foods, fruits and vegetables, and foods rich in calcium and vitamin D.  Avoid foods that have more than 300 mg of sodium per serving. Too much sodium can cause your body to lose calcium.  Exercise is an important part of prevention and treatment of osteoporosis. Aim for at least 30 minutes a day, 5 days a week. This information is not intended to replace advice given to you by your health care provider. Make sure you discuss any questions you have with your health care provider. Document Released: 03/28/2017 Document Revised: 03/28/2017 Document Reviewed: 03/28/2017 Elsevier Patient Education  2020 [employer].

## 2018-12-07 NOTE — Progress Notes (Signed)
Telehealth office visit note for Kimberly Merritt, D.O- at [facility] at [facility]   I connected with current patient today and verified that I am speaking with the correct person using two identifiers.    Location of the patient: Home  Location of the provider: Office Only the patient (+/- their family members at pt's discretion) and myself were participating in the encounter - This visit type was conducted due to national recommendations for restrictions regarding the COVID-19 Pandemic (e.g. social distancing) in an effort to limit this patient's exposure and mitigate transmission in our community.  This format is felt to be most appropriate for this patient at this time.   - The patient did not have access to video technology or had technical difficulties with video requiring transitioning to audio format only. - No physical exam could be performed with this format, beyond that communicated to [place] by the patient/ family members as noted.   - Additionally my office staff/ schedulers discussed with the patient that there may be a monetary charge related to this service, depending on their medical insurance.   The patient expressed understanding, and agreed to proceed.      History of Present Illness:  I, [name], am serving as scribe for Dr. [name].  Says she still has the sniffles "but it's improving somewhat."  Recently had mammogram and DEXA in October.  - Osteopenia & Need to Begin Calcium Supplementation Patient with questions today about risks of calcium supplementation.  Notes in 2012, when she was under the care of Dr. Alain Marion, "we had done imaging and everything and found that I had the calcified atherosclerotic disease."  She was taking 600 mg of calcium at that time, along with a multivitamin and Vitamin C.  Says "he gradually took me off of the vitamins."  Says her PCP "felt that because of the concern about plaque build-up, that I shouldn't be  doing the calcium."  Says 'there's still been some questions about that, it causing the plaque build-up, and that it can affect the heart disease and other things."  Notes concerned about this.  States in years past, she had to have her left great toe operated on (thought it was a bunion at the time, but it turned out to be a "bone spur of the calcium type").  Says "it was causing all my pain."  Notes "when I had my back surgery, they found some spurs on my spine at that time, and I also had some problems with the shoulders."  Says "it's my opinion that my body was not fully absorbing any of the calcium supplements that I was taking."    Is also concerned about getting back on calcium with her history, on her father's side, of aneurysms of the aorta.  Notes "he had three aneurysms on the aorta, plus two in the groin."  Notes concerned about "getting some of her own."  Per patient, plans on increasing her calcium intake through her diet.  - Concerns about Abdominal Aortic Aneurysm Notes had a CT of abdominal pelvis in January 19, 2013.  States she is concerned about AAA screening due to her father's medical history.  Also notes had an abdominal complete scan in 2012, with Dr. Alain Marion.    Depression screen [facility] 2/9 12/07/2018 10/23/2018 06/21/2018 05/30/2018 05/22/2018  Decreased Interest 0 0 0 0 0  Down, Depressed, Hopeless 0 0 0 0 1  PHQ - 2 Score 0 0 0  0 1  Altered sleeping 0 0 0 0 2  Tired, decreased energy 1 0 1 0 1  Change in appetite 0 0 0 0 0  Feeling bad or failure about yourself  0 0 0 0 0  Trouble concentrating 0 0 0 0 0  Moving slowly or fidgety/restless 0 0 0 0 0  Suicidal thoughts 0 0 0 0 0  PHQ-9 Score 1 0 1 0 4  Difficult doing work/chores Not difficult at all Not difficult at all Not difficult at all Not difficult at all -  Some recent data might be hidden      Impression and Recommendations:    1. Osteopenia after menopause   2. Atherosclerotic cardiovascular  disease   3. Family history of abdominal aortic aneurysm (AAA)- father, elder   4. h/o foot Bone spur   5. Diverticulosis of sigmoid colon      Osteopenia after Menopause, h/o Foot Bone Spur, Atherosclerotic CVD - Patient last obtained DEXA on 11/14/2018.  Assessment showed, according to [facility] criteria, patient is osteopenic, not osteoporotic.  Recommended follow-up DEXA in two years.  - Last mammogram obtained on 11/14/2018, negative.  - Clinic spoke with patient in depth on 11/15/2018 in regards to the calcium supplement and what Dr. Raliegh Scarlet stated in her results.    - Patient was advised to start back on calcium supplement, but had some concerns prior to starting this med. She was wishing to speak with clinic staff about this and get some answers about these concerns.   - Patient was asked to schedule telehealth to discuss additional concerns regarding beginning calcium supplement.  - Discussed patient's concerns during appointment today. - Risks and benefits of beginning calcium supplementation reviewed at length today.  - Reviewed patient's fears regarding calcium increasing the development of bone spurs and exacerbated atherosclerotic CVD.  Explained that both of these health conditions may occur regardless of calcium intake.  - Extensive education provided and all questions answered.  Lengthy conversation held.  - Reviewed supplemental calcium guidelines.  Discussed that calcium intake in all postmenopausal women should be 1200 mg daily as well as vitamin D of 1600 IUs daily.   These are not doses that are in excess that could potentially harm her.  - Discussed that calcium may be obtained through various dietary sources as well.  - Per patient, does not have a computer.  Educational handout printed regarding osteoporosis.  This printout will be held at front office for patient to pick up at her leisure.  - Encouraged patient to engage in weight-bearing exercises and prudent weight  lifting to help improve bone health.  - Will continue to monitor.  Atherosclerotic Vascular Disease; Father with AAA - Reviewed patient's historical CT scans as brought up during appointment. - Discussed that patient's historical imaging did not show evidence of aneurysm in the past.  - Advised patient to check with her insurance regarding screening guidelines.  Discussed that unless an AAA was present in the patient, screening may not be covered by patient's insurance.  - Reviewed that screening is not recommended per guidelines for patient at this time.  - To help increase her overall wellbeing in light of her atherosclerotic vascular disease, encouraged patient to eat healthfully, exercise, and engage in prudent habits.  - Advised patient to continue treatment plan as prescribed by specialists, and continue to follow up with vascular specialists and cardiology as established.  - Advised patient to ask her cardiac and vascular specialists vascular/cardiac related  questions in the future as well.  - Additionally, discussion had with patient regarding our treatment plan, and their biases/concerns about that plan were used in my medical decision making today.    - The patient agreed with the plan and demonstrated an understanding of the instructions.   No barriers to understanding were identified.     Return for Keep appointment in January.    Medications Discontinued During This Encounter  Medication Reason   loratadine (CLARITIN) 10 MG tablet Error    I provided 25+ minutes of non face-to-face time during this encounter.  Additional time was spent with charting and coordination of care after the actual visit commenced.   Note:  This note was prepared with assistance of Dragon voice recognition software. Occasional wrong-word or sound-a-like substitutions may have occurred due to the inherent limitations of voice recognition software.  This document serves as a record of services  personally performed by Kimberly Dance, DO. It was created on her behalf by [name], a trained medical scribe. The creation of this record is based on the scribe's personal observations and the provider's statements to them.   This case required medical decision making of at least moderate complexity. The above documentation has been reviewed to be accurate and was completed by Marjory Sneddon, D.O.      Patient Care Team    Relationship Specialty Notifications Start End  Kimberly Dance, DO PCP - General Family Medicine  03/29/17   Melrose Nakayama, MD Attending Physician Orthopedic Surgery  05/28/11   Magrinat, Virgie Dad, MD  Hematology and Oncology  05/28/11   Clarene Essex, MD  Gastroenterology  05/28/11   Rutherford Guys, MD Attending Physician Ophthalmology  05/28/11   Jovita Gamma, MD Consulting Physician Neurosurgery  08/21/13   Minus Breeding, MD Consulting Physician Cardiology  11/29/13   Levy Sjogren, MD Referring Physician Dermatology  04/05/17   Irine Seal, MD Attending Physician Urology  04/05/17   Mosetta Anis, MD Referring Physician Allergy  04/05/17   Elam Dutch, MD Consulting Physician Vascular Surgery  02/02/18     -Vitals obtained; medications/ allergies reconciled;  personal medical, social, Sx etc.histories were updated by CMA, reviewed by me and are reflected in chart   Patient Active Problem List   Diagnosis Date Noted   Hypertension 04/05/2017    Priority: High   Benign colonic polyp- found in 07/2011- told f/up Dr Watt Climes 5 yrs 04/05/2017    Priority: Medium   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[place]) 11/26/2016    Priority: Medium   Nonischemic cardiomyopathy ([place]) 11/29/2013    Priority: Medium   MONOCLONAL GAMMOPATHY 03/03/2010    Priority: Medium   Herpes labialis 12/01/2009    Priority: Medium   Gastroesophageal reflux disease 08/29/2006    Priority: Medium   disabled status- since age 64 due to RA in [place] 04/05/2017     Priority: Low   Environmental and seasonal allergies 04/05/2017    Priority: Low   History of hypoglycemia 04/05/2017    Priority: Low   Post-menopausal 04/05/2017    Priority: Low   Hypothyroidism (acquired) 08/29/2006    Priority: Low   Generalized osteoarthritis 08/29/2006    Priority: Low   Osteopenia after menopause 12/07/2018   h/o foot Bone spur- of the calcium type 12/07/2018   Atherosclerotic cardiovascular disease 12/07/2018   Family history of abdominal aortic aneurysm (AAA)- father, elder 12/07/2018   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   Hyperlipidemia 03/09/2018   Immunodeficiency ([place]) 12/01/2017   Chronic pain  of right knee-since age 74 per patient 04/12/2017   Prediabetes 04/12/2017   Vitamin D insufficiency 04/12/2017   CAD (coronary artery disease) 04/05/2017   Atherosclerosis of aorta ([place]) 04/05/2017   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   History of colonic polyps 04/05/2017   Scoliosis 04/05/2017   Ventricular premature depolarization 04/05/2017   PVC's (premature ventricular contractions) 05/18/2016   Chronic cough 10/23/2014   Bradycardia 09/25/2013   Lip lesion 08/21/2013   Diverticulosis of colon without hemorrhage 05/02/2013   Paresthesia 06/20/2012   Well adult exam 08/31/2011   Cough 08/10/2011   Sinusitis, acute frontal 02/15/2011   Shoulder pain, right 07/09/2010   LIVER HEMANGIOMA 03/03/2010   MUSCLE SPASM 03/03/2010   Disorder of bone and cartilage 11/19/2008   HEPATIC CYST 08/02/2007   ALLERGIC RHINITIS 04/25/2007   HOARSENESS 04/25/2007   VARICOSE VEINS, LOWER EXTREMITIES 08/29/2006   LOW BACK PAIN 08/29/2006   Pain in Soft Tissues of Limb 08/29/2006     Current Meds  Medication Sig   fexofenadine (ALLEGRA) 180 MG tablet Take 180 mg by mouth daily.     Allergies:  Allergies  Allergen Reactions   Acetaminophen Rash    REACTION: high dose - tremor Per pt - rash on chest and heart  palpitations   Alendronate Sodium     Stomach upset.    Codeine     May interfere with heart condition     Contrast Media [Iodinated Diagnostic Agents]     Syncope.    Diflunisal     Stomach pain.     Doxycycline     n/v   Flexeril [Cyclobenzaprine]     Abdominal pain.     Nabumetone     Abdominal pain; dark, tarry stool.     Oxybutynin     Pt does not remember.     Raloxifene     Unknown.     Sulfa Antibiotics Nausea Only    Syncope.     Symbicort [Budesonide-Formoterol Fumarate]     "Jittery-ness."    Tramadol Hcl     Numbness.     Tramadol Hcl Itching    Numbness.     Diclofenac Palpitations    Dark, tarry stool.     Latex Rash    ROS:  See above HPI for pertinent positives and negatives   Objective:   Blood pressure 127/72, pulse (!) 56, height 5\' 4"  (1.626 m), weight 126 lb (57.2 kg).  (if some vitals are omitted, this means that patient was UNABLE to obtain them even though they were asked to get them prior to OV today.  They were asked to call us at their earliest convenience with these once obtained. ) General: A & O * 3; sounds in no acute distress; in usual state of health.  Skin: Pt confirms warm and dry extremities and pink fingertips HEENT: Pt confirms lips non-cyanotic Chest: Patient confirms normal chest excursion and movement Respiratory: speaking in full sentences, no conversational dyspnea; patient confirms no use of accessory muscles Psych: insight appears good, mood- appears full

## 2018-12-11 ENCOUNTER — Other Ambulatory Visit: Payer: Self-pay | Admitting: Cardiology

## 2018-12-13 ENCOUNTER — Other Ambulatory Visit: Payer: Self-pay | Admitting: Medical

## 2018-12-13 ENCOUNTER — Inpatient Hospital Stay: Payer: Medicare Other

## 2018-12-13 ENCOUNTER — Other Ambulatory Visit: Payer: Self-pay

## 2018-12-13 VITALS — BP 139/67 | HR 66 | Temp 99.2°F | Resp 16

## 2018-12-13 DIAGNOSIS — R05 Cough: Secondary | ICD-10-CM

## 2018-12-13 DIAGNOSIS — D472 Monoclonal gammopathy: Secondary | ICD-10-CM | POA: Diagnosis not present

## 2018-12-13 DIAGNOSIS — Z79899 Other long term (current) drug therapy: Secondary | ICD-10-CM | POA: Diagnosis not present

## 2018-12-13 DIAGNOSIS — M791 Myalgia, unspecified site: Secondary | ICD-10-CM

## 2018-12-13 DIAGNOSIS — I428 Other cardiomyopathies: Secondary | ICD-10-CM | POA: Diagnosis not present

## 2018-12-13 DIAGNOSIS — I10 Essential (primary) hypertension: Secondary | ICD-10-CM | POA: Diagnosis not present

## 2018-12-13 DIAGNOSIS — Z8249 Family history of ischemic heart disease and other diseases of the circulatory system: Secondary | ICD-10-CM | POA: Diagnosis not present

## 2018-12-13 DIAGNOSIS — D801 Nonfamilial hypogammaglobulinemia: Secondary | ICD-10-CM

## 2018-12-13 DIAGNOSIS — Z801 Family history of malignant neoplasm of trachea, bronchus and lung: Secondary | ICD-10-CM | POA: Diagnosis not present

## 2018-12-13 DIAGNOSIS — R053 Chronic cough: Secondary | ICD-10-CM

## 2018-12-13 DIAGNOSIS — K130 Diseases of lips: Secondary | ICD-10-CM

## 2018-12-13 DIAGNOSIS — M8580 Other specified disorders of bone density and structure, unspecified site: Secondary | ICD-10-CM | POA: Diagnosis not present

## 2018-12-13 MED ORDER — FAMOTIDINE 20 MG PO TABS
20.0000 mg | ORAL_TABLET | Freq: Once | ORAL | Status: AC
  Administered 2018-12-13: 20 mg via ORAL

## 2018-12-13 MED ORDER — IMMUNE GLOBULIN (HUMAN) 20 GM/200ML IV SOLN
60.0000 g | Freq: Once | INTRAVENOUS | Status: AC
  Administered 2018-12-13: 60 g via INTRAVENOUS
  Filled 2018-12-13: qty 400

## 2018-12-13 MED ORDER — MORPHINE SULFATE 4 MG/ML IJ SOLN
1.0000 mg | Freq: Once | INTRAMUSCULAR | Status: AC
  Administered 2018-12-13: 1 mg via INTRAVENOUS
  Filled 2018-12-13: qty 1

## 2018-12-13 MED ORDER — DIPHENHYDRAMINE HCL 12.5 MG/5ML PO ELIX
12.5000 mg | ORAL_SOLUTION | Freq: Once | ORAL | Status: AC
  Administered 2018-12-13: 12.5 mg via ORAL

## 2018-12-13 MED ORDER — MORPHINE SULFATE (PF) 4 MG/ML IV SOLN: 1.0000 mg | Freq: Once | INTRAVENOUS | Status: DC

## 2018-12-13 MED ORDER — MORPHINE SULFATE (PF) 4 MG/ML IV SOLN
INTRAVENOUS | Status: AC
  Filled 2018-12-13: qty 1

## 2018-12-13 MED ORDER — DIPHENHYDRAMINE HCL 12.5 MG/5ML PO ELIX
ORAL_SOLUTION | ORAL | Status: AC
  Filled 2018-12-13: qty 5

## 2018-12-13 MED ORDER — DEXTROSE 5 % IV SOLN
Freq: Once | INTRAVENOUS | Status: AC
  Administered 2018-12-13: 09:00:00 via INTRAVENOUS
  Filled 2018-12-13: qty 250

## 2018-12-13 MED ORDER — FAMOTIDINE 20 MG PO TABS
ORAL_TABLET | ORAL | Status: AC
  Filled 2018-12-13: qty 1

## 2018-12-13 MED ORDER — SODIUM CHLORIDE 0.9 % IV SOLN
Freq: Once | INTRAVENOUS | Status: DC
  Filled 2018-12-13: qty 250

## 2018-12-13 NOTE — Patient Instructions (Signed)
Immune Globulin Injection What is this medicine? IMMUNE GLOBULIN (im MUNE GLOB yoo lin) helps to prevent or reduce the severity of certain infections in patients who are at risk. This medicine is collected from the pooled blood of many donors. It is used to treat immune system problems, thrombocytopenia, and Kawasaki syndrome. This medicine may be used for other purposes; ask your health care provider or pharmacist if you have questions. COMMON BRAND NAME(S): ASCENIV, Baygam, BIVIGAM, Carimune, Carimune NF, cutaquig, Cuvitru, Flebogamma, Flebogamma DIF, GamaSTAN, GamaSTAN S/D, Gamimune N, Gammagard, Gammagard S/D, Gammaked, Gammaplex, Gammar-P IV, Gamunex, Gamunex-C, Hizentra, Iveegam, Iveegam EN, Octagam, Panglobulin, Panglobulin NF, panzyga, Polygam S/D, Privigen, Sandoglobulin, Venoglobulin-S, Vigam, Vivaglobulin, Xembify What should I tell my health care provider before I take this medicine? They need to know if you have any of these conditions:   diabetes   extremely low or no immune antibodies in the blood   heart disease   history of blood clots   hyperprolinemia   infection in the blood, sepsis   kidney disease   taking medicine that may change kidney function - ask your health care provider about your medicine   an unusual or allergic reaction to human immune globulin, albumin, maltose, sucrose, polysorbate 80, other medicines, foods, dyes, or preservatives   pregnant or trying to get pregnant   breast-feeding How should I use this medicine? This medicine is for injection into a muscle or infusion into a vein or skin. It is usually given by a health care professional in a hospital or clinic setting. In rare cases, some brands of this medicine might be given at home. You will be taught how to give this medicine. Use exactly as directed. Take your medicine at regular intervals. Do not take your medicine more often than directed. Talk to your pediatrician regarding  the use of this medicine in children. Special care may be needed. Overdosage: If you think you have taken too much of this medicine contact a poison control center or emergency room at once. NOTE: This medicine is only for you. Do not share this medicine with others. What if I miss a dose? It is important not to miss your dose. Call your doctor or health care professional if you are unable to keep an appointment. If you give yourself the medicine and you miss a dose, take it as soon as you can. If it is almost time for your next dose, take only that dose. Do not take double or extra doses. What may interact with this medicine?  aspirin and aspirin-like medicines  cisplatin  cyclosporine  medicines for infection like acyclovir, adefovir, amphotericin B, bacitracin, cidofovir, foscarnet, ganciclovir, gentamicin, pentamidine, vancomycin  NSAIDS, medicines for pain and inflammation, like ibuprofen or naproxen  pamidronate  vaccines  zoledronic acid This list may not describe all possible interactions. Give your health care provider a list of all the medicines, herbs, non-prescription drugs, or dietary supplements you use. Also tell them if you smoke, drink alcohol, or use illegal drugs. Some items may interact with your medicine. What should I watch for while using this medicine? Your condition will be monitored carefully while you are receiving this medicine. This medicine is made from pooled blood donations of many different people. It may be possible to pass an infection in this medicine. However, the donors are screened for infections and all products are tested for HIV and hepatitis. The medicine is treated to kill most or all bacteria and viruses. Talk to your doctor about   the risks and benefits of this medicine. Do not have vaccinations for at least 14 days before, or until at least 3 months after receiving this medicine. What side effects may I notice from receiving this  medicine? Side effects that you should report to your doctor or health care professional as soon as possible:  allergic reactions like skin rash, itching or hives, swelling of the face, lips, or tongue  breathing problems  chest pain or tightness  fever, chills  headache with nausea, vomiting  neck pain or difficulty moving neck  pain when moving eyes  pain, swelling, warmth in the leg  problems with balance, talking, walking  sudden weight gain  swelling of the ankles, feet, hands  trouble passing urine or change in the amount of urine Side effects that usually do not require medical attention (report to your doctor or health care professional if they continue or are bothersome):  dizzy, drowsy  flushing  increased sweating  leg cramps  muscle aches and pains  pain at site where injected This list may not describe all possible side effects. Call your doctor for medical advice about side effects. You may report side effects to FDA at 1-800-FDA-1088. Where should I keep my medicine? Keep out of the reach of children. This drug is usually given in a hospital or clinic and will not be stored at home. In rare cases, some brands of this medicine may be given at home. If you are using this medicine at home, you will be instructed on how to store this medicine. Throw away any unused medicine after the expiration date on the label. NOTE: This sheet is a summary. It may not cover all possible information. If you have questions about this medicine, talk to your doctor, pharmacist, or health care provider.  2020 Elsevier/Gold Standard (2008-04-10 11:44:49)  

## 2018-12-13 NOTE — Progress Notes (Signed)
Patient with history of reaction to IVIG. Per last infusion encounter, patient received benadryl and pepcid as premedication. Verbal order received from Dr. Jana Hakim for premeds (see MAR). Patient has allergy to tylenol, therefore it was not prescribed.   1235--Patient reports body aches. Patient does have history of arthritis but states this is new from her pains she had earlier in the day.  VS have been stable during checks. Patient states it may be due to her sitting in the chair for a long duration. Sandi Mealy, PA notified. See MAR.    1535--Patient's VSS upon completion of IVIG and 30 minute post observation period. Patient reports improvement in the body aches since the pain medication was administered.

## 2018-12-18 DIAGNOSIS — H353132 Nonexudative age-related macular degeneration, bilateral, intermediate dry stage: Secondary | ICD-10-CM | POA: Diagnosis not present

## 2018-12-18 DIAGNOSIS — H43811 Vitreous degeneration, right eye: Secondary | ICD-10-CM | POA: Diagnosis not present

## 2018-12-18 LAB — HM DIABETES EYE EXAM

## 2019-01-08 DIAGNOSIS — C44729 Squamous cell carcinoma of skin of left lower limb, including hip: Secondary | ICD-10-CM | POA: Diagnosis not present

## 2019-01-08 DIAGNOSIS — L820 Inflamed seborrheic keratosis: Secondary | ICD-10-CM | POA: Diagnosis not present

## 2019-01-22 ENCOUNTER — Other Ambulatory Visit: Payer: Self-pay | Admitting: Family Medicine

## 2019-01-22 DIAGNOSIS — K219 Gastro-esophageal reflux disease without esophagitis: Secondary | ICD-10-CM

## 2019-01-22 DIAGNOSIS — E039 Hypothyroidism, unspecified: Secondary | ICD-10-CM

## 2019-02-05 DIAGNOSIS — Z08 Encounter for follow-up examination after completed treatment for malignant neoplasm: Secondary | ICD-10-CM | POA: Diagnosis not present

## 2019-02-05 DIAGNOSIS — Z85828 Personal history of other malignant neoplasm of skin: Secondary | ICD-10-CM | POA: Diagnosis not present

## 2019-02-20 ENCOUNTER — Other Ambulatory Visit: Payer: Self-pay

## 2019-02-20 ENCOUNTER — Ambulatory Visit (INDEPENDENT_AMBULATORY_CARE_PROVIDER_SITE_OTHER): Payer: Medicare Other | Admitting: Family Medicine

## 2019-02-20 ENCOUNTER — Encounter: Payer: Self-pay | Admitting: Family Medicine

## 2019-02-20 VITALS — BP 122/61 | HR 62 | Temp 95.8°F | Ht 64.0 in | Wt 126.0 lb

## 2019-02-20 DIAGNOSIS — Z7189 Other specified counseling: Secondary | ICD-10-CM | POA: Insufficient documentation

## 2019-02-20 DIAGNOSIS — H3530 Unspecified macular degeneration: Secondary | ICD-10-CM | POA: Insufficient documentation

## 2019-02-20 DIAGNOSIS — E2839 Other primary ovarian failure: Secondary | ICD-10-CM | POA: Diagnosis not present

## 2019-02-20 DIAGNOSIS — E559 Vitamin D deficiency, unspecified: Secondary | ICD-10-CM | POA: Diagnosis not present

## 2019-02-20 DIAGNOSIS — K219 Gastro-esophageal reflux disease without esophagitis: Secondary | ICD-10-CM

## 2019-02-20 DIAGNOSIS — Z780 Asymptomatic menopausal state: Secondary | ICD-10-CM

## 2019-02-20 DIAGNOSIS — E039 Hypothyroidism, unspecified: Secondary | ICD-10-CM | POA: Diagnosis not present

## 2019-02-20 DIAGNOSIS — R7303 Prediabetes: Secondary | ICD-10-CM

## 2019-02-20 DIAGNOSIS — M8580 Other specified disorders of bone density and structure, unspecified site: Secondary | ICD-10-CM | POA: Diagnosis not present

## 2019-02-20 MED ORDER — FAMOTIDINE 20 MG PO TABS: 20.0000 mg | ORAL_TABLET | Freq: Two times a day (BID) | ORAL | 0 refills | Status: DC

## 2019-02-20 NOTE — Progress Notes (Signed)
Telehealth office visit note for Kimberly Merritt, D.O- at Primary Care at [facility]   I connected with current patient today and verified that I am speaking with the correct person using two identifiers.   . Location of the patient: Home . Location of the provider: Office Only the patient (+/- their family members at pt's discretion) and myself were participating in the encounter - This visit type was conducted due to national recommendations for restrictions regarding the COVID-19 Pandemic (e.g. social distancing) in an effort to limit this patient's exposure and mitigate transmission in our community.  This format is felt to be most appropriate for this patient at this time.   - No physical exam could be performed with this format, beyond that communicated to [place] by the patient/ family members as noted.   - Additionally my office staff/ schedulers discussed with the patient that there may be a monetary charge related to this service, depending on their medical insurance.   The patient expressed understanding, and agreed to proceed.       History of Present Illness: Hypertension   I, Kimberly Merritt, am serving as scribe for Dr. [name].  Notes she has a cough today but otherwise fine.  Denies depression, anxiety, or mood concerns today.  Says "in the past couple of weeks, maybe, there was just a lot of maintenance things and problems at the house and stuff like that.  It starts draining on you because you've only got (x) number of dollars to work with and you need to do what needs to get done and you get frustrated."  Says "things have a way of working out; I read my bible and listen to my preacher."  - GERD Notes "it had calmed down, and now all of a sudden, I don't know if it's something I ate or what aggravated it, but yesterday I ended up having sour stomach."  Continues taking Tagamet every day morning and night as prescribed.  Recently had a notice from her  insurance that told her they were going to increase the price.  She tried famotidine over the counter, 20 mg once daily, "but it didn't seem to be resolving the problem."  Notes "I don't know if there's a stronger strength or what."  Patient has had esophagitis before, but denies known history of erosive esophagitis or Barrett's esophagitis.  Says sometimes when she's coughing or expectorating, "I'll cough up and it'll be the size of a pea and it's yellow."  Her last visit to Dr. Watt Climes was in May of 2019.  Says she has experienced more acid indigestion in the past week or two than prior, and "could be attributed to some foods."  Notes "you can only eat bland stuff for so long and then you want a taste of something different."  - Hypothyroidism No concerns with her hypothyroidism.  - Osteoporosis; weight bearing exercise Notes she has to burn wood and states she has to carry this into the house.  "I get kind of winded and then my heart wants to act up a little bit, and I say 'I've gotta rest.'"  This has been her main exercise "with it staying so wet and so cold."  - Macular Degeneration Was recently seen by eye specialist Dr. Zadie Rhine told that she has the beginning of macular degeneration in her left eye, but was not prescribed anything.  States "I was taken aback and didn't know what type of questions I should  have asked him at that time if there's treatment or what I can do currently."  Patient was told to come back in a year unless she noticed it getting worse.  - Recent Skin Cancer Removal & Skin Infection Also saw Dr. Rozann Lesches on the 7th of December and "she removed a skin cancer from the inside left calf of my leg."  States this area ended up getting infected and she returned on January 4th for antibiotics.   No flowsheet data found.  Depression screen [facility] 2/9 02/20/2019 12/07/2018 10/23/2018 06/21/2018 05/30/2018  Decreased Interest 0 0 0 0 0  Down, Depressed, Hopeless 0 0 0 0 0    PHQ - 2 Score 0 0 0 0 0  Altered sleeping 0 0 0 0 0  Tired, decreased energy 1 1 0 1 0  Change in appetite 0 0 0 0 0  Feeling bad or failure about yourself  0 0 0 0 0  Trouble concentrating 0 0 0 0 0  Moving slowly or fidgety/restless 0 0 0 0 0  Suicidal thoughts 0 0 0 0 0  PHQ-9 Score 1 1 0 1 0  Difficult doing work/chores Not difficult at all Not difficult at all Not difficult at all Not difficult at all Not difficult at all  Some recent data might be hidden      Impression and Recommendations:    1. Osteopenia after menopause   2. Estrogen deficiency   3. Vitamin D insufficiency   4. Hypothyroidism (acquired)   5. Gastroesophageal reflux disease, unspecified whether esophagitis present   6. Prediabetes   7. Macular degeneration of left eye, unspecified type   8. Counseled about COVID-19 virus infection/  vaccine      - Told patient to have all records from specialists (ophthalmology, dermatology) sent to clinic here.  GERD - Currently managed on Tagamet BID AM and PM. - Per patient, her insurance is changing to make this more expensive.  - Per changes in patient's insurance, discussed beginning Famotidine 20 mg prescription twice daily. - Discontinue cimetidine due to cost. - Begin famotidine.  See med list.  - Discussed critical need to monitor dietary triggers of acid reflux, and engage in lifestyle changes in addition to prescription intervention.  - Patient will continue to monitor and see if symptoms improve with lifestyle management and meds. - Encouraged patient to ask her gastroenterologist regarding all GI-related concerns. - If symptoms persist, worsen, or become more concerning despite therapy, patient knows to follow up.  - Will continue to monitor alongside specialist.  Vitamin D Deficiency - Stable last check at 42.5. - Discussed need to continue maintenance in 40-60 range. - Continue supplementation as prescribed. - Will continue to monitor and  re-check as dicussed.  Osteopenia after Menopause - Stable at this time. - To help prevent osteoporosis, encouraged patient to continue prudent consumption of calcium and increasing her weight bearing exercise activities.  - Will continue to monitor.  Prediabetes - A1c last checked at 5.6, stable at this time. - Will continue to monitor and re-check as discussed.  Hypothyroidism - Stable at this time. - Patient asymptomatic. - Continue management as established. - Will continue to monitor and re-check as discussed.  Macular Degeneration of Left Eye - Monitored by Dr. Zadie Rhine. - Patient will have records sent to clinic. - Will continue to monitor alongside specialist.  COVID-19 Vaccination Counseling - Education provided to patient today. - Patient is due to obtain vaccine in near future through [facility].  Recommendations - Return in 4 months or sooner if concerns arise.   - As part of my medical decision making, I reviewed the following data within the [place] History obtained from pt /family, CMA notes reviewed and incorporated if applicable, Labs reviewed, Radiograph/ tests reviewed if applicable and OV notes from prior OV's with me, as well as other specialists she/he has seen since seeing me last, were all reviewed and used in my medical decision making process today.    - Additionally, discussion had with patient regarding our treatment plan, and their biases/concerns about that plan were used in my medical decision making today.    - The patient agreed with the plan and demonstrated an understanding of the instructions.   No barriers to understanding were identified.    - Red flag symptoms and signs discussed in detail.  Patient expressed understanding regarding what to do in case of emergency\ urgent symptoms.   - The patient was advised to call back or seek an in-person evaluation if the symptoms worsen or if the condition fails to improve as  anticipated.   Return for 4 mo or sooner if concerns arise.    Meds ordered this encounter  Medications  . famotidine (PEPCID) 20 MG tablet    Sig: Take 1 tablet (20 mg total) by mouth 2 (two) times daily.    Dispense:  180 tablet    Refill:  0    Medications Discontinued During This Encounter  Medication Reason  . cimetidine (TAGAMET) 400 MG tablet Change in therapy     I provided 21 minutes of non face-to-face time during this encounter.  Additional time was spent with charting and coordination of care before and after the actual visit commenced.   Note:  This note was prepared with assistance of Dragon voice recognition software. Occasional wrong-word or sound-a-like substitutions may have occurred due to the inherent limitations of voice recognition software.  This document serves as a record of services personally performed by Kimberly Dance, DO. It was created on her behalf by Kimberly Merritt, a trained medical scribe. The creation of this record is based on the scribe's personal observations and the provider's statements to them.   This case required medical decision making of at least moderate complexity. The above documentation has been reviewed to be accurate and was completed by Marjory Sneddon, D.O.       Patient Care Team    Relationship Specialty Notifications Start End  Kimberly Dance, DO PCP - General Family Medicine  03/29/17   Melrose Nakayama, MD Attending Physician Orthopedic Surgery  05/28/11   Magrinat, Virgie Dad, MD  Hematology and Oncology  05/28/11   Clarene Essex, MD  Gastroenterology  05/28/11   Rutherford Guys, MD Attending Physician Ophthalmology  05/28/11   Jovita Gamma, MD Consulting Physician Neurosurgery  08/21/13   Minus Breeding, MD Consulting Physician Cardiology  11/29/13   Levy Sjogren, MD Referring Physician Dermatology  04/05/17   Irine Seal, MD Attending Physician Urology  04/05/17   Mosetta Anis, MD Referring Physician  Allergy  04/05/17   Elam Dutch, MD Consulting Physician Vascular Surgery  02/02/18   Hurman Horn, MD Consulting Physician Ophthalmology  02/20/19    Comment: mac degen     -Vitals obtained; medications/ allergies reconciled;  personal medical, social, Sx etc.histories were updated by CMA, reviewed by me and are reflected in chart   Patient Active Problem List   Diagnosis Date Noted  . Hypertension  04/05/2017  . Benign colonic polyp- found in 07/2011- told f/up Dr Watt Climes 5 yrs 04/05/2017  . 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 Herpes labialis 12/01/2009  . Gastroesophageal reflux disease 08/29/2006  . disabled status- since age 58 due to RA in [place] 04/05/2017  . Environmental and seasonal allergies 04/05/2017  . History of hypoglycemia 04/05/2017  . Post-menopausal 04/05/2017  . Hypothyroidism (acquired) 08/29/2006  . Generalized osteoarthritis 08/29/2006  . Estrogen deficiency 02/20/2019  . Macular degeneration of left eye 02/20/2019  . Counseled about COVID-19 virus infection/  vaccine 02/20/2019  . Osteopenia after menopause 12/07/2018  . h/o foot Bone spur- of the calcium type 12/07/2018  . Atherosclerotic cardiovascular disease 12/07/2018  . Family history of abdominal aortic aneurysm (AAA)- father, elder 12/07/2018  . 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 Hyperlipidemia 03/09/2018  . Immunodeficiency ([place]) 12/01/2017  . Chronic pain of right knee-since age 54 per patient 04/12/2017  . Prediabetes 04/12/2017  . Vitamin D insufficiency 04/12/2017  . CAD (coronary artery disease) 04/05/2017  . Atherosclerosis of aorta ([place]) 04/05/2017  . 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 History of colonic polyps 04/05/2017  . Scoliosis 04/05/2017  . Ventricular premature depolarization 04/05/2017  . PVC's (premature ventricular contractions) 05/18/2016  . Chronic cough 10/23/2014  .  Bradycardia 09/25/2013  . Lip lesion 08/21/2013  . Diverticulosis of colon without hemorrhage 05/02/2013  . Paresthesia 06/20/2012  . Well adult exam 08/31/2011  . Cough 08/10/2011  . Sinusitis, acute frontal 02/15/2011  . Shoulder pain, right 07/09/2010  . LIVER HEMANGIOMA 03/03/2010  . MUSCLE SPASM 03/03/2010  . Disorder of bone and cartilage 11/19/2008  . HEPATIC CYST 08/02/2007  . ALLERGIC RHINITIS 04/25/2007  . HOARSENESS 04/25/2007  . 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 LOW BACK PAIN 08/29/2006  . Pain in Soft Tissues of Limb 08/29/2006     Current Meds  Medication Sig  . carvedilol (COREG) 12.5 MG tablet TAKE 1 TABLET BY MOUTH 2 TIMES DAILY.  Marland Kitchen Cholecalciferol (VITAMIN D3) 50 MCG (2000 UT) capsule Take 1 capsule by mouth daily.   . cromolyn (NASALCROM) 5.2 MG/ACT nasal spray Place 1 spray into both nostrils 4 (four) times daily.  . fexofenadine (ALLEGRA) 180 MG tablet Take 180 mg by mouth daily.  . Glucosamine-Chondroitin 1500-1200 MG/30ML LIQD Take 1 tablet by mouth daily.   Marland Kitchen losartan (COZAAR) 25 MG tablet TAKE 1 TABLET BY MOUTH DAILY.  . pantoprazole (PROTONIX) 40 MG tablet Take 40 mg by mouth daily.  Vladimir Faster Glycol-Propyl Glycol (SYSTANE ULTRA) 0.4-0.3 % SOLN Apply to eye. 2 drops in each eye 3 to 4 times daily  . SALINE NASAL SPRAY NA Place into the nose. 2 sprays each nostril every 4 hours  . sucralfate (CARAFATE) 1 g tablet Take 1 g by mouth daily as needed.   Marland Kitchen SYNTHROID 25 MCG tablet TAKE 1 TABLET BY MOUTH DAILY BEFORE BREAKFAST.  . [DISCONTINUED] cimetidine (TAGAMET) 400 MG tablet TAKE 1 TABLET BY MOUTH 2 TIMES DAILY.     Allergies:  Allergies  Allergen Reactions  . Acetaminophen Rash    REACTION: high dose - tremor Per pt - rash on chest and heart palpitations  . Alendronate Sodium     Stomach upset.   . Codeine     May interfere with heart condition    . Contrast Media [Iodinated Diagnostic Agents]     Syncope.   . Diflunisal      Stomach pain.    Marland Kitchen  Doxycycline     n/v  . Flexeril [Cyclobenzaprine]     Abdominal pain.    . Nabumetone     Abdominal pain; dark, tarry stool.    . Oxybutynin     Pt does not remember.    . Raloxifene     Unknown.    . Sulfa Antibiotics Nausea Only    Syncope.    . Symbicort [Budesonide-Formoterol Fumarate]     "Jittery-ness."   . Tramadol Hcl     Numbness.    . Tramadol Hcl Itching    Numbness.    . Diclofenac Palpitations    Dark, tarry stool.    . Latex Rash     ROS:  See above HPI for pertinent positives and negatives   Objective:   Blood pressure 122/61, pulse 62, temperature (!) 95.8 F (35.4 C), height _0  (1.626 m), weight 126 lb (57.2 kg).  (if some vitals are omitted, this means that patient was UNABLE to obtain them even though they were asked to get them prior to OV today.  They were asked to call us at their earliest convenience with these once obtained. )  General: A & O * 3; sounds in no acute distress; in usual state of health.  Skin: Pt confirms warm and dry extremities and pink fingertips HEENT: Pt confirms lips non-cyanotic Chest: Patient confirms normal chest excursion and movement Respiratory: speaking in full sentences, no conversational dyspnea; patient confirms no use of accessory muscles Psych: insight appears good, mood- appears full

## 2019-03-03 ENCOUNTER — Ambulatory Visit: Payer: Medicare Other

## 2019-03-08 ENCOUNTER — Ambulatory Visit: Payer: Medicare Other

## 2019-03-09 ENCOUNTER — Ambulatory Visit: Payer: Medicare Other | Attending: Internal Medicine

## 2019-03-09 DIAGNOSIS — Z23 Encounter for immunization: Secondary | ICD-10-CM

## 2019-03-09 NOTE — Progress Notes (Signed)
   Covid-19 Vaccination Clinic  Name:  Kimberly Merritt    MRN: [id number] DOB: September 30, 1940  03/09/2019  [name] was observed post Covid-19 immunization for 30 minutes based on pre-vaccination screening without incidence. She was provided with Vaccine Information Sheet and instruction to access the V-Safe system.   Ms. Might was instructed to call 911 with any severe reactions post vaccine: Marland Kitchen Difficulty breathing  . Swelling of your face and throat  . A fast heartbeat  . A bad rash all over your body  . Dizziness and weakness    Immunizations Administered    Name Date Dose VIS Date Route   Pfizer COVID-19 Vaccine 03/09/2019  1:04 PM 0.3 mL 01/12/2019 Intramuscular   Manufacturer: [place]   Lot: [id number]   [place]: [id number]

## 2019-03-15 ENCOUNTER — Other Ambulatory Visit: Payer: Self-pay

## 2019-03-15 ENCOUNTER — Inpatient Hospital Stay: Payer: Medicare Other | Attending: Oncology

## 2019-03-15 DIAGNOSIS — D472 Monoclonal gammopathy: Secondary | ICD-10-CM

## 2019-03-15 DIAGNOSIS — D801 Nonfamilial hypogammaglobulinemia: Secondary | ICD-10-CM | POA: Insufficient documentation

## 2019-03-15 DIAGNOSIS — M899 Disorder of bone, unspecified: Secondary | ICD-10-CM

## 2019-03-15 DIAGNOSIS — M949 Disorder of cartilage, unspecified: Secondary | ICD-10-CM

## 2019-03-15 LAB — COMPREHENSIVE METABOLIC PANEL
ALT: 18 U/L (ref 0–44)
AST: 18 U/L (ref 15–41)
Albumin: 3.7 g/dL (ref 3.5–5.0)
Alkaline Phosphatase: 74 U/L (ref 38–126)
Anion gap: 8 (ref 5–15)
BUN: 19 mg/dL (ref 8–23)
CO2: 28 mmol/L (ref 22–32)
Calcium: 8.7 mg/dL — ABNORMAL LOW (ref 8.9–10.3)
Chloride: 102 mmol/L (ref 98–111)
Creatinine, Ser: 0.81 mg/dL (ref 0.44–1.00)
GFR calc Af Amer: >60 mL/min (ref >60)
GFR calc non Af Amer: >60 mL/min (ref >60)
Glucose, Bld: 94 mg/dL (ref 70–99)
Potassium: 4.4 mmol/L (ref 3.5–5.1)
Sodium: 138 mmol/L (ref 135–145)
Total Bilirubin: 0.4 mg/dL (ref 0.3–1.2)
Total Protein: 7.2 g/dL (ref 6.5–8.1)

## 2019-03-15 LAB — CBC WITH DIFFERENTIAL/PLATELET
Abs Immature Granulocytes: 0.01 10*3/uL (ref 0.00–0.07)
Basophils Absolute: 0.1 10*3/uL (ref 0.0–0.1)
Basophils Relative: 1 %
Eosinophils Absolute: 0.1 10*3/uL (ref 0.0–0.5)
Eosinophils Relative: 2 %
HCT: 38.4 % (ref 36.0–46.0)
Hemoglobin: 12.5 g/dL (ref 12.0–15.0)
Immature Granulocytes: 0 %
Lymphocytes Relative: 45 %
Lymphs Abs: 3.8 10*3/uL (ref 0.7–4.0)
MCH: 31 pg (ref 26.0–34.0)
MCHC: 32.6 g/dL (ref 30.0–36.0)
MCV: 95.3 fL (ref 80.0–100.0)
Monocytes Absolute: 1.1 10*3/uL — ABNORMAL HIGH (ref 0.1–1.0)
Monocytes Relative: 14 %
Neutro Abs: 3.1 10*3/uL (ref 1.7–7.7)
Neutrophils Relative %: 38 %
Platelets: 276 10*3/uL (ref 150–400)
RBC: 4.03 MIL/uL (ref 3.87–5.11)
RDW: 12.1 % (ref 11.5–15.5)
WBC: 8.2 10*3/uL (ref 4.0–10.5)
nRBC: 0 % (ref 0.0–0.2)

## 2019-03-19 ENCOUNTER — Encounter: Payer: Self-pay | Admitting: Oncology

## 2019-03-19 LAB — MULTIPLE MYELOMA PANEL, SERUM
Albumin SerPl Elph-Mcnc: 3.8 g/dL (ref 2.9–4.4)
Albumin/Glob SerPl: 1.3 (ref 0.7–1.7)
Alpha 1: 0.2 g/dL (ref 0.0–0.4)
Alpha2 Glob SerPl Elph-Mcnc: 0.6 g/dL (ref 0.4–1.0)
B-Globulin SerPl Elph-Mcnc: 1.8 g/dL — ABNORMAL HIGH (ref 0.7–1.3)
Gamma Glob SerPl Elph-Mcnc: 0.5 g/dL (ref 0.4–1.8)
Globulin, Total: 3 g/dL (ref 2.2–3.9)
IgA: 14 mg/dL — ABNORMAL LOW (ref 64–422)
IgG (Immunoglobin G), Serum: 623 mg/dL (ref 586–1602)
IgM (Immunoglobulin M), Srm: 1712 mg/dL — ABNORMAL HIGH (ref 26–217)
M Protein SerPl Elph-Mcnc: 1.1 g/dL — ABNORMAL HIGH
Total Protein ELP: 6.8 g/dL (ref 6.0–8.5)

## 2019-04-03 ENCOUNTER — Ambulatory Visit: Payer: Medicare Other | Attending: Internal Medicine

## 2019-04-03 DIAGNOSIS — Z23 Encounter for immunization: Secondary | ICD-10-CM

## 2019-04-03 NOTE — Progress Notes (Signed)
   Covid-19 Vaccination Clinic  Name:  Kimberly Merritt    MRN: [id number] DOB: [birth year]  04/03/2019  [name] was observed post Covid-19 immunization for 15 minutes without incident. She was provided with Vaccine Information Sheet and instruction to access the V-Safe system.   [name] was instructed to call 911 with any severe reactions post vaccine: Marland Kitchen Difficulty breathing  . Swelling of face and throat  . A fast heartbeat  . A bad rash all over body  . Dizziness and weakness   Immunizations Administered    Name Date Dose VIS Date Route   Pfizer COVID-19 Vaccine 04/03/2019  1:18 PM 0.3 mL 01/12/2019 Intramuscular   Manufacturer: [place]   Lot: [id number]   [place]: [id number]

## 2019-04-26 ENCOUNTER — Other Ambulatory Visit: Payer: Self-pay | Admitting: Cardiology

## 2019-04-27 ENCOUNTER — Other Ambulatory Visit: Payer: Self-pay | Admitting: Cardiology

## 2019-04-27 NOTE — Telephone Encounter (Signed)
*  STAT* If patient is at the pharmacy, call can be transferred to refill team.   1. Which medications need to be refilled? (please list name of each medication and dose if known)  carvedilol (COREG) 12.5 MG tablet  2. Which pharmacy/location (including street and city if local pharmacy) is medication to be sent to? [place], [place]  3. Do they need a 30 day or 90 day supply? 90  Patient says the pharmacy has tried to contact our office but has not gotten a response.

## 2019-05-22 ENCOUNTER — Telehealth: Payer: Self-pay | Admitting: Family Medicine

## 2019-05-22 MED ORDER — FAMOTIDINE 20 MG PO TABS: 20.0000 mg | ORAL_TABLET | Freq: Two times a day (BID) | ORAL | 0 refills | Status: DC

## 2019-05-22 NOTE — Telephone Encounter (Signed)
Refill sent to pharmacy. AS< CMA 

## 2019-05-22 NOTE — Addendum Note (Signed)
Addended by: Mickel Crow on: 05/22/2019 10:06 AM   Modules accepted: Orders

## 2019-05-22 NOTE — Telephone Encounter (Signed)
Patient is requesting a refill of her famotidine, if approved please send to [facility]

## 2019-06-12 ENCOUNTER — Other Ambulatory Visit: Payer: Self-pay

## 2019-06-12 ENCOUNTER — Inpatient Hospital Stay: Payer: Medicare Other | Attending: Oncology

## 2019-06-12 DIAGNOSIS — M949 Disorder of cartilage, unspecified: Secondary | ICD-10-CM

## 2019-06-12 DIAGNOSIS — I429 Cardiomyopathy, unspecified: Secondary | ICD-10-CM | POA: Diagnosis not present

## 2019-06-12 DIAGNOSIS — D801 Nonfamilial hypogammaglobulinemia: Secondary | ICD-10-CM | POA: Insufficient documentation

## 2019-06-12 DIAGNOSIS — M899 Disorder of bone, unspecified: Secondary | ICD-10-CM

## 2019-06-12 DIAGNOSIS — D472 Monoclonal gammopathy: Secondary | ICD-10-CM

## 2019-06-12 LAB — CBC WITH DIFFERENTIAL/PLATELET
Abs Immature Granulocytes: 0.03 10*3/uL (ref 0.00–0.07)
Basophils Absolute: 0.1 10*3/uL (ref 0.0–0.1)
Basophils Relative: 1 %
Eosinophils Absolute: 0.1 10*3/uL (ref 0.0–0.5)
Eosinophils Relative: 1 %
HCT: 37.1 % (ref 36.0–46.0)
Hemoglobin: 11.8 g/dL — ABNORMAL LOW (ref 12.0–15.0)
Immature Granulocytes: 0 %
Lymphocytes Relative: 41 %
Lymphs Abs: 3.3 10*3/uL (ref 0.7–4.0)
MCH: 30.4 pg (ref 26.0–34.0)
MCHC: 31.8 g/dL (ref 30.0–36.0)
MCV: 95.6 fL (ref 80.0–100.0)
Monocytes Absolute: 1.1 10*3/uL — ABNORMAL HIGH (ref 0.1–1.0)
Monocytes Relative: 14 %
Neutro Abs: 3.4 10*3/uL (ref 1.7–7.7)
Neutrophils Relative %: 43 %
Platelets: 305 10*3/uL (ref 150–400)
RBC: 3.88 MIL/uL (ref 3.87–5.11)
RDW: 12 % (ref 11.5–15.5)
WBC: 7.9 10*3/uL (ref 4.0–10.5)
nRBC: 0 % (ref 0.0–0.2)

## 2019-06-12 LAB — COMPREHENSIVE METABOLIC PANEL
ALT: 16 U/L (ref 0–44)
AST: 17 U/L (ref 15–41)
Albumin: 3.6 g/dL (ref 3.5–5.0)
Alkaline Phosphatase: 76 U/L (ref 38–126)
Anion gap: 9 (ref 5–15)
BUN: 16 mg/dL (ref 8–23)
CO2: 28 mmol/L (ref 22–32)
Calcium: 8.8 mg/dL — ABNORMAL LOW (ref 8.9–10.3)
Chloride: 101 mmol/L (ref 98–111)
Creatinine, Ser: 0.84 mg/dL (ref 0.44–1.00)
GFR calc Af Amer: >60 mL/min (ref >60)
GFR calc non Af Amer: >60 mL/min (ref >60)
Glucose, Bld: 92 mg/dL (ref 70–99)
Potassium: 4.4 mmol/L (ref 3.5–5.1)
Sodium: 138 mmol/L (ref 135–145)
Total Bilirubin: 0.5 mg/dL (ref 0.3–1.2)
Total Protein: 7 g/dL (ref 6.5–8.1)

## 2019-06-14 LAB — MULTIPLE MYELOMA PANEL, SERUM
Albumin SerPl Elph-Mcnc: 3.6 g/dL (ref 2.9–4.4)
Albumin/Glob SerPl: 1.3 (ref 0.7–1.7)
Alpha 1: 0.2 g/dL (ref 0.0–0.4)
Alpha2 Glob SerPl Elph-Mcnc: 0.6 g/dL (ref 0.4–1.0)
B-Globulin SerPl Elph-Mcnc: 1.7 g/dL — ABNORMAL HIGH (ref 0.7–1.3)
Gamma Glob SerPl Elph-Mcnc: 0.3 g/dL — ABNORMAL LOW (ref 0.4–1.8)
Globulin, Total: 2.9 g/dL (ref 2.2–3.9)
IgA: 12 mg/dL — ABNORMAL LOW (ref 64–422)
IgG (Immunoglobin G), Serum: 415 mg/dL — ABNORMAL LOW (ref 586–1602)
IgM (Immunoglobulin M), Srm: 1695 mg/dL — ABNORMAL HIGH (ref 26–217)
M Protein SerPl Elph-Mcnc: 1.1 g/dL — ABNORMAL HIGH
Total Protein ELP: 6.5 g/dL (ref 6.0–8.5)

## 2019-06-17 NOTE — Progress Notes (Signed)
[place]  Telephone:([phone]) [phone] Fax:(336) [phone]     ID: Kimberly Merritt DOB: 15-Mar-1940  MR#: [id number]  RXV#:[id number]  Patient Care Team: Lorrene Reid, PA-C as PCP - General Melrose Nakayama, MD as Attending Physician (Orthopedic Surgery) Rontrell Moquin, Virgie Dad, MD (Hematology and Oncology) Clarene Essex, MD (Gastroenterology) Rutherford Guys, MD as Attending Physician (Ophthalmology) Jovita Gamma, MD as Consulting Physician (Neurosurgery) Minus Breeding, MD as Consulting Physician (Cardiology) Levy Sjogren, MD as Referring Physician (Dermatology) Irine Seal, MD as Attending Physician (Urology) Mosetta Anis, MD as Referring Physician (Allergy) Elam Dutch, MD as Consulting Physician (Vascular Surgery) Hurman Horn, MD as Consulting Physician (Ophthalmology) OTHER MD: Minus Breeding MD, Marshell Garfinkel MD  CHIEF COMPLAINT: IgM M-GUS/ low-grade lymphoproliferative process  CURRENT TREATMENT: IVIG as needed   [place] returns today for 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   We are following her M-protein and her total IgG. Results for Kimberly Merritt, Kimberly Merritt (MRN [id number]) as of 06/18/2019 11:30  Ref. Range 11/24/2017 10:44 05/31/2018 10:43 11/30/2018 10:32 03/15/2019 10:58 06/12/2019 11:05  IgM (Immunoglobulin M), Srm Latest Ref Range: 26 - 217 mg/dL 1,432 (H) 1,754 (H) 1,598 (H) 1,712 (H) 1,695 (H)  Results for Kimberly Merritt, Kimberly Merritt (MRN [id number]) as of 06/18/2019 11:30  Ref. Range 11/24/2017 10:44 05/31/2018 10:43 11/30/2018 10:32 03/15/2019 10:58 06/12/2019 11:05  IgG (Immunoglobin G), Serum Latest Ref Range: 586 - 1,602 mg/dL 372 (L) 494 (L) 341 (L) 623 415 (L)   Since her last visit, she underwent left leg skin lesion removal on 01/08/2019. Pathology from the procedure ([id number]) showed invasive squamous cell carcinoma, well-differentiated.   REVIEW OF SYSTEMS: Kimberly Merritt tells me after she had lab work  here a few days ago she developed a chill and cough with production.  She did not take her temperature at home but she felt hot and she says her lips were a bit cracked.  She did not call to tell us about this.  She spent a couple of days this lying around and after that she felt better.  She has received both Pfizer vaccine doses.  She did not have any reaction to the second dose.  She recently was pulling a tree out from her [place] fell backward and landed on her tailbone which she bruised slightly but she feels she did not break since she has had no bruising and currently the discomfort in that area is better.  Detailed review of systems today was otherwise stable   HISTORY PRESENT ILLNESS::  from the earlier summary note:  "Kimberly Merritt" Kimberly Merritt is a 79 y/o [place] Merritt I evaluated originally in October of 2004 for a monoclonal gammopathy. At that time she had a white cell count of 7.2, hemoglobin 12.8, MCV 94.5, and platelets 298,000. Protein workup showed an IgM kappa paraprotein of 0.6 g, with a total IgG slightly depressed at 556, but a normal IgA at 82. Because an IgM paraprotein can be associated with a low grade lymphoproliferative process (Waldenstrm's macroglobulinemia) Kimberly Merritt underwent bone marrow biopsy 11/27/2002, which showed ([id number]) a normal cellular marrow with trilineage hematopoiesis and 5% plasma cells. The peripheral blood film showed no significant abnormalities and on the biopsy para-spicular lymphoid aggregates were not noted. 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  The patient was followed with observation alone through May of 2009, with no evidence of progression in her IgM kappa clone. As of 06/03/2007 her total IgM spike was 0.83, her IgG  was 490 and her IgA was 36. The patient was released from followup here at that time.  More recently, as the 10 year anniversary of her original bone marrow biopsy past, Dr. Dorann Lodge of referred  the patient back for reassessment and possible bone marrow rebiopsy. Kimberly Merritt was scheduled here for lab work 09/25/2013. However when she had her blood drawn she complained of some shortness of breath and her pulse was checked. It was 33. The patient was referred to the emergency room where she was evaluated by Dr. Percival Spanish who felt the patient's true heart rate was higher (she has ectopic beats which generated a week pulse and may have been missed) since in the emergency room EKG her rate was 98 with normal sinus rhythm. Troponins were negative. BNP was slightly elevated.  Dr. Percival Spanish set the patient up for an echocardiogram 09/27/2013 and this showed an ejection fraction of 30-35%. There was diffuse hypokinesis. She underwent right and left heart catheterization 10/04/2013 which showed no significant obstructions. The study did confirm an ejection fraction of roughly 35% with global hypokinesis. She is followed by Dr. Percival Spanish for her Kimberly diagnosis of non-ischemic cardiomyopathy.  Her subsequent history is as detailed below   PAST MEDICAL HISTORY: Past Medical History:  Diagnosis Date  . Allergy   . CAD (coronary artery disease)    Nonobstructive. LAD 30% stenosis, small PDA of the circumflex 70% stenosis, right coronary artery 40% stenosis. Catheterization September 2015  . DEPRESSION 08/29/2006  . Diverticulosis   . GERD 08/29/2006  . HEPATIC CYST 08/02/2007  . Hypertension   . HYPOTHYROIDISM 08/29/2006  . LIVER HEMANGIOMA 03/03/2010  . LOW BACK PAIN 08/29/2006  . 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 MUSCLE SPASM 03/03/2010  . Nonischemic cardiomyopathy (HCC)    EF 30%  . OSTEOARTHRITIS 08/29/2006  . OSTEOPENIA 11/19/2008  . PARESTHESIA 03/05/2009  . PVC's (premature ventricular contractions)   . Scoliosis   . VARICOSE VEINS, LOWER EXTREMITIES 08/29/2006    PAST SURGICAL HISTORY: Past Surgical History:  Procedure Laterality Date  . ABDOMINAL HYSTERECTOMY  1981  . APPENDECTOMY    . BACK  SURGERY  2010,2006   lumb fusion  . BUNIONECTOMY     both  . BUNIONECTOMY Right 1985  . CARPAL TUNNEL RELEASE     both  . CARPAL TUNNEL RELEASE Right 1992  . CARPAL TUNNEL RELEASE Left 1992  . CATARACT EXTRACTION     both  . CATARACT EXTRACTION Right 2008  . CATARACT EXTRACTION Left 2008  . HAMMER TOE SURGERY Left 1987  . HAMMER TOE SURGERY Right 1986  . LEFT AND RIGHT HEART CATHETERIZATION WITH CORONARY ANGIOGRAM N/A 10/04/2013   Procedure: LEFT AND RIGHT HEART CATHETERIZATION WITH CORONARY ANGIOGRAM;  Surgeon: Leonie Man, MD;  Location: [facility] CATH LAB;  Service: Cardiovascular;  Laterality: N/A;  . LUMBAR FUSION  2006  . LUMBAR FUSION  2010  . OOPHORECTOMY  1967  . OVARIAN CYST REMOVAL    . ROTATOR CUFF REPAIR Right 2013  . SHOULDER ARTHROSCOPY  02/09/2011   Procedure: ARTHROSCOPY SHOULDER;  Surgeon: Hessie Dibble, MD;  Location: [facility];  Service: Orthopedics;  Laterality: Right;  right shoulder arthroscopy subacromial decompression with rotator cuff repair  . TONSILLECTOMY      FAMILY HISTORY Family History  Problem Relation Age of Onset  . Stroke Mother   . Cancer Father   . Emphysema Father   . Hypertension Other   . Breast cancer Neg Hx    the  patient's father died at the age of 50 from lung cancer in the setting of tobacco abuse. [name] mother died at the age of 70 following a stroke. The patient had one brother, who is severely retarded and died from pneumonia at the age of 11. There were no sisters. There is no history of blood problems or cancer in the family to the patient's knowledge   GYNECOLOGIC HISTORY:  No LMP recorded. Patient is postmenopausal. Menarche age 34. The patient is GX P0. She stopped having periods in 1978/04/10 and took hormone replacement for almost 20 years.   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 Her husband died in 1991-04-10 from a myocardial infarction.  She tells me she has  no family.  She lives by herself, [place], with one cat for company.    ADVANCED DIRECTIVES: Her healthcare power of attorney is Charm Barges who can be reached at [phone] (cell), or [phone] (home).  Also if Baldo Ash had an acute problem she would want [place] to contact her neighbor and third cousin Andrey Cota at [place]:  Social History   Tobacco Use  . Smoking status: Never Smoker  . Smokeless tobacco: Never Used  Substance Use Topics  . Alcohol use: No    Alcohol/week: 0.0 standard drinks  . Drug use: No     Colonoscopy: Apr 09, 2012  PAP: 04/10/11  Bone density: 11/2018, -2.4  Lipid panel:  Allergies  Allergen Reactions  . Acetaminophen Rash    REACTION: high dose - tremor Per pt - rash on chest and heart palpitations  . Alendronate Sodium     Stomach upset.   . Codeine     May interfere with heart condition    . Contrast Media [Iodinated Diagnostic Agents]     Syncope.   . Diflunisal     Stomach pain.    . Doxycycline     n/v  . Flexeril [Cyclobenzaprine]     Abdominal pain.    . Nabumetone     Abdominal pain; dark, tarry stool.    . Oxybutynin     Pt does not remember.    . Raloxifene     Unknown.    . Sulfa Antibiotics Nausea Only    Syncope.    . Symbicort [Budesonide-Formoterol Fumarate]     "Jittery-ness."   . Tramadol Hcl     Numbness.    . Tramadol Hcl Itching    Numbness.    . Diclofenac Palpitations    Dark, tarry stool.    . Latex Rash    Current Outpatient Medications  Medication Sig Dispense Refill  . carvedilol (COREG) 12.5 MG tablet TAKE 1 TABLET BY MOUTH 2 TIMES DAILY. 180 tablet 3  . Cholecalciferol (VITAMIN D3) 50 MCG (2000 UT) capsule Take 1 capsule by mouth daily.     . cromolyn (NASALCROM) 5.2 MG/ACT nasal spray Place 1 spray into both nostrils 4 (four) times daily.    . famotidine (PEPCID) 20 MG tablet Take 1 tablet (20 mg total) by mouth 2 (two) times daily. 180 tablet 0  . fexofenadine (ALLEGRA)  180 MG tablet Take 180 mg by mouth daily.    . Glucosamine-Chondroitin 1500-1200 MG/30ML LIQD Take 1 tablet by mouth daily.     Marland Kitchen losartan (COZAAR) 25 MG tablet TAKE 1 TABLET BY MOUTH DAILY. 90 tablet 2  . losartan (COZAAR) 25 MG tablet Take 1 tablet (25 mg total)  by mouth daily.    . pantoprazole (PROTONIX) 40 MG tablet Take 40 mg by mouth daily.    Vladimir Faster Glycol-Propyl Glycol (SYSTANE ULTRA) 0.4-0.3 % SOLN Apply to eye. 2 drops in each eye 3 to 4 times daily    . SALINE NASAL SPRAY NA Place into the nose. 2 sprays each nostril every 4 hours    . sucralfate (CARAFATE) 1 g tablet Take 1 g by mouth daily as needed.     Marland Kitchen SYNTHROID 25 MCG tablet TAKE 1 TABLET BY MOUTH DAILY BEFORE BREAKFAST. 90 tablet 1   No current facility-administered medications for this visit.    OBJECTIVE: white Merritt who appears stated age  53:   06/18/19 1105  BP: (!) 157/72  Pulse: 64  Resp: 18  Temp: (!) 97.4 F (36.3 C)  SpO2: 98%     Body mass index is 21.95 kg/m.    ECOG FS:2 - Symptomatic, <50% confined to bed  Sclerae unicteric, EOMs intact Wearing a mask No cervical or supraclavicular adenopathy Lungs no rales or rhonchi Heart regular rate and rhythm Abd soft, nontender, positive bowel sounds MSK no focal spinal tenderness including lumbar spine area Neuro: nonfocal, well oriented, appropriate affect Breasts: Deferred   LAB RESULTS:  CMP     Component Value Date/Time   NA 138 06/12/2019 1105   NA 137 10/23/2018 1110   NA 136 11/04/2016 1115   K 4.4 06/12/2019 1105   K 4.8 11/04/2016 1115   CL 101 06/12/2019 1105   CO2 28 06/12/2019 1105   CO2 27 11/04/2016 1115   GLUCOSE 92 06/12/2019 1105   GLUCOSE 95 11/04/2016 1115   BUN 16 06/12/2019 1105   BUN 19 10/23/2018 1110   BUN 16.7 11/04/2016 1115   CREATININE 0.84 06/12/2019 1105   CREATININE 0.8 11/04/2016 1115   CALCIUM 8.8 (L) 06/12/2019 1105   CALCIUM 9.0 11/04/2016 1115   PROT 7.0 06/12/2019 1105   PROT 6.6  10/23/2018 1110   PROT 6.6 11/04/2016 1115   ALBUMIN 3.6 06/12/2019 1105   ALBUMIN 4.0 10/23/2018 1110   ALBUMIN 3.5 11/04/2016 1115   AST 17 06/12/2019 1105   AST 23 11/04/2016 1115   ALT 16 06/12/2019 1105   ALT 18 11/04/2016 1115   ALKPHOS 76 06/12/2019 1105   ALKPHOS 82 11/04/2016 1115   BILITOT 0.5 06/12/2019 1105   BILITOT 0.4 10/23/2018 1110   BILITOT 0.40 11/04/2016 1115   GFRNONAA >60 06/12/2019 1105   GFRNONAA 69 10/02/2013 1234   GFRAA >60 06/12/2019 1105   GFRAA 80 10/02/2013 1234    I No results found for: SPEP  Lab Results  Component Value Date   WBC 7.9 06/12/2019   NEUTROABS 3.4 06/12/2019   HGB 11.8 (L) 06/12/2019   HCT 37.1 06/12/2019   MCV 95.6 06/12/2019   PLT 305 06/12/2019      Chemistry      Component Value Date/Time   NA 138 06/12/2019 1105   NA 137 10/23/2018 1110   NA 136 11/04/2016 1115   K 4.4 06/12/2019 1105   K 4.8 11/04/2016 1115   CL 101 06/12/2019 1105   CO2 28 06/12/2019 1105   CO2 27 11/04/2016 1115   BUN 16 06/12/2019 1105   BUN 19 10/23/2018 1110   BUN 16.7 11/04/2016 1115   CREATININE 0.84 06/12/2019 1105   CREATININE 0.8 11/04/2016 1115   GLU 89 04/08/2016 0000      Component Value Date/Time   CALCIUM 8.8 (L)  06/12/2019 1105   CALCIUM 9.0 11/04/2016 1115   ALKPHOS 76 06/12/2019 1105   ALKPHOS 82 11/04/2016 1115   AST 17 06/12/2019 1105   AST 23 11/04/2016 1115   ALT 16 06/12/2019 1105   ALT 18 11/04/2016 1115   BILITOT 0.5 06/12/2019 1105   BILITOT 0.4 10/23/2018 1110   BILITOT 0.40 11/04/2016 1115       No results found for: LABCA2  No components found for: LABCA125  No results for input(s): INR in the last 168 hours.  Urinalysis    Component Value Date/Time   COLORURINE LT. YELLOW 06/08/2011 0830   APPEARANCEUR CLEAR 06/08/2011 0830   LABSPEC 1.010 06/08/2011 0830   PHURINE 6.5 06/08/2011 0830   GLUCOSEU Negative 10/23/2018 1047   GLUCOSEU NEGATIVE 06/08/2011 0830   HGBUR NEGATIVE 06/08/2011  0830   BILIRUBINUR neg 10/23/2018 1047   KETONESUR NEGATIVE 06/08/2011 0830   PROTEINUR NEGATIVE 04/19/2008 1216   UROBILINOGEN 0.2 10/23/2018 1047   UROBILINOGEN 0.2 06/08/2011 0830   NITRITE negative 10/23/2018 1047   NITRITE NEGATIVE 06/08/2011 0830   LEUKOCYTESUR Negative 10/23/2018 1047    STUDIES: No results found.   ASSESSMENT: 79 y.o. 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 When it causes amyloidosis of the heart, which is quite rare, the pattern is restrictive. 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 In case of an infection, IVIG supplementation should be considered  (a) received IVIG October 2016  (b) repeated IVIG October 2018  (c) repeated November 2019 and November 2020   PLAN:  Kimberly Merritt spike remains essentially stable Results for JAZELYN, SIPE (MRN [id number]) as of 06/18/2019 11:30  Ref. Range 02/24/2010 20:05 05/26/2010 14:18 09/25/2013 13:21 10/18/2013 09:48 11/06/2015 10:21  M-SPIKE, % Latest Ref Range: Not Observed g/dL 0.93 0.77 0.72 0.87 0.9 (H)  However her IgG is low.  We usually booster in November to get her through the winter and we will do that again this year.  Even though she has had the Pfizer vaccine which is generally excellent at making antibodies, she is unlikely to derive significant benefit from it given her immunocompromise state.  Her B cells are simply not working well and that is the reason her IgG is so low.  Accordingly I have suggested that she consider herself not vaccinated and take appropriate precautions which means she will need to continue to wear a mask and distance until there is herd immunity if we ever achieve that.   I am going to obtain a chest x-ray today given her history is suggestive of recent pneumonia, but she is currently afebrile and her lungs are clear by auscultation.  I urged her to call us if she develops any illness because we can easily give her some IVIG to boost her immune system temporarily and help her get over things  Otherwise as stated she will return in November for IVIG and then to see me again a year from now  Total encounter time 25 minutes.* Darcey Demma, Virgie Dad, MD  06/18/19 11:30 AM Medical Oncology and Hematology [facility] [place], [place] [address] Tel. [phone]    Fax. [phone]    I, Wilburn Mylar, am acting as scribe for  Dr. Virgie Dad. Nadiya Pieratt.  I, Lurline Del MD, have reviewed the above documentation for accuracy and completeness, and I agree with the above.   *Total Encounter Time as defined by the Centers for Medicare and Medicaid Services includes, in addition to the face-to-face time of a patient visit (documented in the note above) non-face-to-face time: obtaining and reviewing outside history, ordering and reviewing medications, tests or procedures, care coordination (communications with other health care professionals or caregivers) and documentation in the medical record.

## 2019-06-18 ENCOUNTER — Inpatient Hospital Stay: Payer: Medicare Other | Admitting: Oncology

## 2019-06-18 ENCOUNTER — Other Ambulatory Visit: Payer: Self-pay

## 2019-06-18 ENCOUNTER — Ambulatory Visit (HOSPITAL_COMMUNITY)
Admission: RE | Admit: 2019-06-18 | Discharge: 2019-06-18 | Disposition: A | Discharge status: Home / Self Care | Payer: Medicare Other | Source: Ambulatory Visit | Attending: Oncology | Admitting: Oncology

## 2019-06-18 VITALS — BP 157/72 | HR 64 | Temp 97.4°F | Resp 18 | Ht 64.0 in | Wt 127.9 lb

## 2019-06-18 DIAGNOSIS — D801 Nonfamilial hypogammaglobulinemia: Secondary | ICD-10-CM

## 2019-06-18 DIAGNOSIS — D472 Monoclonal gammopathy: Secondary | ICD-10-CM | POA: Diagnosis not present

## 2019-06-18 DIAGNOSIS — R509 Fever, unspecified: Secondary | ICD-10-CM | POA: Diagnosis not present

## 2019-06-18 DIAGNOSIS — Z780 Asymptomatic menopausal state: Secondary | ICD-10-CM

## 2019-06-18 DIAGNOSIS — R05 Cough: Secondary | ICD-10-CM | POA: Diagnosis not present

## 2019-06-18 DIAGNOSIS — I429 Cardiomyopathy, unspecified: Secondary | ICD-10-CM | POA: Diagnosis not present

## 2019-06-18 DIAGNOSIS — M8580 Other specified disorders of bone density and structure, unspecified site: Secondary | ICD-10-CM | POA: Diagnosis not present

## 2019-06-19 ENCOUNTER — Telehealth: Payer: Self-pay | Admitting: Oncology

## 2019-06-19 ENCOUNTER — Telehealth: Payer: Self-pay | Admitting: *Deleted

## 2019-06-19 NOTE — Progress Notes (Signed)
Established Patient Office Visit  Subjective:  Patient ID: Kimberly Merritt, female    DOB: 1940-12-10  Age: 79 y.o. MRN: [id number]  CC:  Chief Complaint  Patient presents with  . Cough  . Fatigue    HPI  Kimberly Merritt presents for 4 month chronic follow-up on hypertension, GERD, hypothyroid, pre-diabetes, and concerns of not feeling well for the past week. States she went for blood work on 06/12/19 and afterwards starting feeling sick with chills, productive cough with brown-yellow sputum, and low grade fever. She rested and hydrated which seemed to help, but a few days later started feeling poor again. She reports malaise, generalized weakness, chest tightness, pressure behind L eye and maxillary sinus. Her cough is intermittent throughout the day and worse in the mornings and at night, and sputum is now white. She does have nasal congestion and coryza from seasonal allergies and has been doing neil sinus rinses, warm salt water gargles, and using her Cromolyn inhaler. She has been taking Coricidin and Claritin-D on and off with minimal relief. Denies sick contact exposures.   HTN: Pt denies new onset chest pain, palpitations, dizziness or increased lower extremity swelling from baseline. Taking medication as directed without side effects. Checks BP at home and readings range in 120s/60-70.  GERD: Reports pepcid doesn't help to control her symptoms. States Tagamet provided more relief but due to insurance coverage, it was changed to Pepcid. She tries to watch what she eats to decrease her flare-ups.  Hypothyroid: Taking medication without issues. Denies bowel changes, weight changes, increased hair thinning or loss. She reports fatigue but attributes it to not feeling well.   Pre-diabetes: Denies increased thirst. Reports she has noticed increased nocturia during her sickness and does drink some water before going to bed if she's thirsty.     Past Medical History:  Diagnosis Date   . Allergy   . CAD (coronary artery disease)    Nonobstructive. LAD 30% stenosis, small PDA of the circumflex 70% stenosis, right coronary artery 40% stenosis. Catheterization September 2015  . DEPRESSION 08/29/2006  . Diverticulosis   . GERD 08/29/2006  . HEPATIC CYST 08/02/2007  . Hypertension   . HYPOTHYROIDISM 08/29/2006  . LIVER HEMANGIOMA 03/03/2010  . LOW BACK PAIN 08/29/2006  . 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 MUSCLE SPASM 03/03/2010  . Nonischemic cardiomyopathy (HCC)    EF 30%  . OSTEOARTHRITIS 08/29/2006  . OSTEOPENIA 11/19/2008  . PARESTHESIA 03/05/2009  . PVC's (premature ventricular contractions)   . Scoliosis   . VARICOSE VEINS, LOWER EXTREMITIES 08/29/2006    Past Surgical History:  Procedure Laterality Date  . ABDOMINAL HYSTERECTOMY  1981  . APPENDECTOMY    . BACK SURGERY  2010,2006   lumb fusion  . BUNIONECTOMY     both  . BUNIONECTOMY Right 1985  . CARPAL TUNNEL RELEASE     both  . CARPAL TUNNEL RELEASE Right 1992  . CARPAL TUNNEL RELEASE Left 1992  . CATARACT EXTRACTION     both  . CATARACT EXTRACTION Right 2008  . CATARACT EXTRACTION Left 2008  . HAMMER TOE SURGERY Left 1987  . HAMMER TOE SURGERY Right 1986  . LEFT AND RIGHT HEART CATHETERIZATION WITH CORONARY ANGIOGRAM N/A 10/04/2013   Procedure: LEFT AND RIGHT HEART CATHETERIZATION WITH CORONARY ANGIOGRAM;  Surgeon: Leonie Man, MD;  Location[facility] C[facility] LAB;  Service: Cardiovascular;  Laterality: N/A;  . LUMBAR FUSION  2006  . LUMBAR FUSION  2010  . OOPHORECTOMY  1967  .  OVARIAN CYST REMOVAL    . ROTATOR CUFF REPAIR Right 2013  . SHOULDER ARTHROSCOPY  02/09/2011   Procedure: ARTHROSCOPY SHOULDER;  Surgeon: Hessie Dibble, MD;  Location: [place];  Service: Orthopedics;  Laterality: Right;  right shoulder arthroscopy subacromial decompression with rotator cuff repair  . TONSILLECTOMY      Family History  Problem Relation Age of Onset  . Stroke Mother   . Cancer Father   .  Emphysema Father   . Hypertension Other   . Breast cancer Neg Hx     Social History   Socioeconomic History  . Marital status: Widowed    Spouse name: Not on file  . Number of children: Not on file  . Years of education: Not on file  . Highest education level: Not on file  Occupational History  . Occupation: Retired  Tobacco Use  . Smoking status: Never Smoker  . Smokeless tobacco: Never Used  Substance and Sexual Activity  . Alcohol use: No    Alcohol/week: 0.0 standard drinks  . Drug use: No  . Sexual activity: Not Currently  Other Topics Concern  . Not on file  Social History Narrative   Lives alone.     Social Determinants of Health   Financial Resource Strain:   . Difficulty of Paying Living Expenses:   Food Insecurity:   . Worried About Charity fundraiser in the Last Year:   . Arboriculturist in the Last Year:   Transportation Needs:   . Film/video editor (Medical):   Marland Kitchen Lack of Transportation (Non-Medical):   Physical Activity:   . Days of Exercise per Week:   . Minutes of Exercise per Session:   Stress:   . Feeling of Stress :   Social Connections:   . Frequency of Communication with Friends and Family:   . Frequency of Social Gatherings with Friends and Family:   . Attends Religious Services:   . Active Member of Clubs or Organizations:   . Attends Archivist Meetings:   Marland Kitchen Marital Status:   Intimate Partner Violence:   . Fear of Current or Ex-Partner:   . Emotionally Abused:   Marland Kitchen Physically Abused:   . Sexually Abused:     Outpatient Medications Prior to Visit  Medication Sig Dispense Refill  . carvedilol (COREG) 12.5 MG tablet TAKE 1 TABLET BY MOUTH 2 TIMES DAILY. 180 tablet 3  . Cholecalciferol (VITAMIN D3) 50 MCG (2000 UT) capsule Take 1 capsule by mouth daily.     . cromolyn (NASALCROM) 5.2 MG/ACT nasal spray Place 1 spray into both nostrils 4 (four) times daily.    . famotidine (PEPCID) 20 MG tablet Take 1 tablet (20 mg total)  by mouth 2 (two) times daily. 180 tablet 0  . fexofenadine (ALLEGRA) 180 MG tablet Take 180 mg by mouth daily.    . Glucosamine-Chondroitin 1500-1200 MG/30ML LIQD Take 1 tablet by mouth daily.     Marland Kitchen losartan (COZAAR) 25 MG tablet TAKE 1 TABLET BY MOUTH DAILY. 90 tablet 2  . losartan (COZAAR) 25 MG tablet Take 1 tablet (25 mg total) by mouth daily.    . pantoprazole (PROTONIX) 40 MG tablet Take 40 mg by mouth daily.    Vladimir Faster Glycol-Propyl Glycol (SYSTANE ULTRA) 0.4-0.3 % SOLN Apply to eye. 2 drops in each eye 3 to 4 times daily    . SALINE NASAL SPRAY NA Place into the nose. 2 sprays each nostril every 4  hours    . sucralfate (CARAFATE) 1 g tablet Take 1 g by mouth daily as needed.     Marland Kitchen SYNTHROID 25 MCG tablet TAKE 1 TABLET BY MOUTH DAILY BEFORE BREAKFAST. 90 tablet 1   No facility-administered medications prior to visit.    Allergies  Allergen Reactions  . Acetaminophen Rash    REACTION: high dose - tremor Per pt - rash on chest and heart palpitations  . Alendronate Sodium     Stomach upset.   . Codeine     May interfere with heart condition    . Contrast Media [Iodinated Diagnostic Agents]     Syncope.   . Diflunisal     Stomach pain.    . Doxycycline     n/v  . Flexeril [Cyclobenzaprine]     Abdominal pain.    . Nabumetone     Abdominal pain; dark, tarry stool.    . Oxybutynin     Pt does not remember.    . Raloxifene     Unknown.    . Sulfa Antibiotics Nausea Only    Syncope.    . Symbicort [Budesonide-Formoterol Fumarate]     "Jittery-ness."   . Tramadol Hcl     Numbness.    . Tramadol Hcl Itching    Numbness.    . Diclofenac Palpitations    Dark, tarry stool.    . Latex Rash    ROS Review of Systems  A fourteen system review of systems was performed and found to be positive as per HPI.   Objective:    Physical Exam General: Well nourished, in no apparent distress. HEENT: PERRLA, EOMs, conjunctiva clr. R TM normal. Middle ear effusion of L ear  with minimal redness noted. No tenderness noted during exam. Tenderness to palpation of maxillary sinus B/L.  Resp: Respiratory effort- normal, ECTA B/L w/o W/R/R  Cardio: RRR  Abdomen: no gross distention. Lymphatics:  less 2 sec cap RF M-sk: Full ROM, 5/5 strength, normal gait.  Skin: Warm, dry  Neuro: Alert, Oriented Psych: Normal affect, Insight and Judgment appropriate.    BP 133/82   Pulse (!) 122   Temp (!) 97.1 F (36.2 C) (Oral)   Ht '5\' 4"'  (1.626 m)   Wt 127 lb 12.8 oz (58 kg)   SpO2 98%   BMI 21.94 kg/m  Wt Readings from Last 3 Encounters:  06/20/19 127 lb 12.8 oz (58 kg)  06/18/19 127 lb 14.4 oz (58 kg)  02/20/19 126 lb (57.2 kg)     There are no preventive care reminders to display for this patient.  There are no preventive care reminders to display for this patient.  Lab Results  Component Value Date   TSH 1.660 10/23/2018   Lab Results  Component Value Date   WBC 7.9 06/12/2019   HGB 11.8 (L) 06/12/2019   HCT 37.1 06/12/2019   MCV 95.6 06/12/2019   PLT 305 06/12/2019   Lab Results  Component Value Date   NA 138 06/12/2019   K 4.4 06/12/2019   CHLORIDE 102 11/04/2016   CO2 28 06/12/2019   GLUCOSE 92 06/12/2019   BUN 16 06/12/2019   CREATININE 0.84 06/12/2019   BILITOT 0.5 06/12/2019   ALKPHOS 76 06/12/2019   AST 17 06/12/2019   ALT 16 06/12/2019   PROT 7.0 06/12/2019   ALBUMIN 3.6 06/12/2019   CALCIUM 8.8 (L) 06/12/2019   ANIONGAP 9 06/12/2019   EGFR 69 (L) 11/04/2016   GFR 72.58 08/21/2013  Lab Results  Component Value Date   CHOL 183 10/23/2018   Lab Results  Component Value Date   HDL 74 10/23/2018   Lab Results  Component Value Date   LDLCALC 99 10/23/2018   Lab Results  Component Value Date   TRIG 52 10/23/2018   Lab Results  Component Value Date   CHOLHDL 2.5 10/23/2018   Lab Results  Component Value Date   HGBA1C 5.6 10/23/2018      Assessment & Plan:   Problem List Items Addressed This Visit       Cardiovascular and Mediastinum   Hypertension (Chronic)     Respiratory   Allergic rhinitis     Digestive   Gastroesophageal reflux disease (Chronic)     Endocrine   Hypothyroidism (acquired) (Chronic)     Other   Environmental and seasonal allergies (Chronic)   Cough   Relevant Medications   benzonatate (TESSALON) 100 MG capsule   Prediabetes    Other Visit Diagnoses    Maxillary sinusitis, unspecified chronicity    -  Primary   Relevant Medications   benzonatate (TESSALON) 100 MG capsule   amoxicillin-clavulanate (AUGMENTIN) 875-125 MG tablet   Middle ear effusion, left          Meds ordered this encounter  Medications  . benzonatate (TESSALON) 100 MG capsule    Sig: Take 1 capsule (100 mg total) by mouth 3 (three) times daily as needed for cough.    Dispense:  30 capsule    Refill:  0    Order Specific Question:   Supervising Provider    Answer:   Beatrice Lecher D [[id number]]  . amoxicillin-clavulanate (AUGMENTIN) 875-125 MG tablet    Sig: Take 1 tablet by mouth 2 (two) times daily.    Dispense:  14 tablet    Refill:  0    Order Specific Question:   Supervising Provider    Answer:   Beatrice Lecher D [[id number]]   Maxillary sinusitis, Allergic rhinitis with postnasal drip, Environmental and Seasonal Allergies: -  Pt had a recent appointment with her oncologist (06/15/19), Dr. Jana Hakim, and he ordered a CXR which revealed no acute findings- no evidence of pneumonia, and ordered labs- CBC w/ diff and CMP which were within normal limits, no leukocytosis or lymphocytosis present. Yet, patient is not feeling symptom improvement, has maxillary sinus tenderness, symptoms started 8 days ago, and has tried multiple treatments with minimal relief so reasonable to start antibiotic. 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 - Continue nasal rinses, warm salt water gargles, and nasal spray  as needed. - Stay well hydrated, at least 64 fl oz - HR elevated today and most likely due to taking Claritin D this morning. Pt has been previously advised to avoid decongestants because of side effects and advised to stop use.  - Follow up if symptoms fail to improve or worsen. Pt reports full vaccination for Covid-19 but Dr. Jana Hakim suggested to continue taking the appropriate precautions given her immunocompromised stated and doesn't think her body will make the adequate antibodies.   Cough: - Start tessalon pearls as needed for cough.  - Use cough drops, warm liquids or honey.  HTN: - BP today is 133/82, stable. - Continue Coreg and Cozaar. - Continue ambulatory BP and pulse monitoring.  - Encourage DASH diet. - Follow up with cardiology as instructed.  Hypothyroidism:  - Last Thyroid panel wnl's - Continue Levothyroxine  25 mcg.  GERD: - Encourage to continue dietary modifications. - Discussed with patient the possibility of changing Famotidine to Cimetidine and do a PA to see if insurance will cover cost. Pt will discuss further at next OV to see if she wants to pursue.   Pre-diabetes: - Last A1c wnl's - Encourage to follow low carbohydrate/glucose diet and stay as active as possible. - Will continue to monitor.    Follow-up: Return in about 3 months (around 09/20/2019) for HTN, Hypothyroid, GERD, Pre-DM.    Lorrene Reid, PA-C

## 2019-06-19 NOTE — Telephone Encounter (Signed)
Scheduled appts per 5/17 los. Moved appts to an earlier time per pt's request. Pt confirmed appt dates and times.

## 2019-06-19 NOTE — Telephone Encounter (Signed)
This RN spoke with pt post her VM- she wanted results of her CXR - which did not show pneumonia.  She states today she feels weaker as well as she is getting a tightness in her " bronchial and I feel like I may be getting some blockage in my sinuses"  She states above is her 3 rd round of illness " and this time I feel the worse ".  Today she took a Coricidin C.  Above discussed with this RN recommending for the patient to contact her primary MD for evaluation for best care.  She states she is scheduled to see the PA tomorrow at her primary MD office - note her primary MD has transferred to another department.  This RN informed her to let the PA know Dr Magrinat's note is viewable in Epic as well as labs and Xray done yesterday.  This RN will inquire with MD if need for IVIG needed at this time.

## 2019-06-20 ENCOUNTER — Other Ambulatory Visit: Payer: Self-pay

## 2019-06-20 ENCOUNTER — Ambulatory Visit (INDEPENDENT_AMBULATORY_CARE_PROVIDER_SITE_OTHER): Payer: Medicare Other | Admitting: Physician Assistant

## 2019-06-20 ENCOUNTER — Encounter: Payer: Self-pay | Admitting: Physician Assistant

## 2019-06-20 VITALS — BP 133/82 | HR 122 | Temp 97.1°F | Ht 64.0 in | Wt 127.8 lb

## 2019-06-20 DIAGNOSIS — J309 Allergic rhinitis, unspecified: Secondary | ICD-10-CM

## 2019-06-20 DIAGNOSIS — E039 Hypothyroidism, unspecified: Secondary | ICD-10-CM

## 2019-06-20 DIAGNOSIS — H6592 Unspecified nonsuppurative otitis media, left ear: Secondary | ICD-10-CM

## 2019-06-20 DIAGNOSIS — J3089 Other allergic rhinitis: Secondary | ICD-10-CM

## 2019-06-20 DIAGNOSIS — R05 Cough: Secondary | ICD-10-CM

## 2019-06-20 DIAGNOSIS — I10 Essential (primary) hypertension: Secondary | ICD-10-CM | POA: Diagnosis not present

## 2019-06-20 DIAGNOSIS — R059 Cough, unspecified: Secondary | ICD-10-CM

## 2019-06-20 DIAGNOSIS — K219 Gastro-esophageal reflux disease without esophagitis: Secondary | ICD-10-CM | POA: Diagnosis not present

## 2019-06-20 DIAGNOSIS — J320 Chronic maxillary sinusitis: Secondary | ICD-10-CM

## 2019-06-20 DIAGNOSIS — R7303 Prediabetes: Secondary | ICD-10-CM

## 2019-06-20 DIAGNOSIS — R0982 Postnasal drip: Secondary | ICD-10-CM

## 2019-06-20 MED ORDER — AMOXICILLIN-POT CLAVULANATE 875-125 MG PO TABS: 1.0000 | ORAL_TABLET | Freq: Two times a day (BID) | ORAL | 0 refills | Status: DC

## 2019-06-20 MED ORDER — BENZONATATE 100 MG PO CAPS: 100.0000 mg | ORAL_CAPSULE | Freq: Three times a day (TID) | ORAL | 0 refills | Status: DC | PRN

## 2019-06-20 NOTE — Patient Instructions (Signed)
Sinusitis, Adult Sinusitis is inflammation of your sinuses. Sinuses are hollow spaces in the bones around your face. Your sinuses are located:  Around your eyes.  In the middle of your forehead.  Behind your nose.  In your cheekbones. Mucus normally drains out of your sinuses. When your nasal tissues become inflamed or swollen, mucus can become trapped or blocked. This allows bacteria, viruses, and fungi to grow, which leads to infection. Most infections of the sinuses are caused by a virus. Sinusitis can develop quickly. It can last for up to 4 weeks (acute) or for more than 12 weeks (chronic). Sinusitis often develops after a cold. What are the causes? This condition is caused by anything that creates swelling in the sinuses or stops mucus from draining. This includes:  Allergies.  Asthma.  Infection from bacteria or viruses.  Deformities or blockages in your nose or sinuses.  Abnormal growths in the nose (nasal polyps).  Pollutants, such as chemicals or irritants in the air.  Infection from fungi (rare). What increases the risk? You are more likely to develop this condition if you:  Have a weak body defense system (immune system).  Do a lot of swimming or diving.  Overuse nasal sprays.  Smoke. What are the signs or symptoms? The main symptoms of this condition are pain and a feeling of pressure around the affected sinuses. Other symptoms include:  Stuffy nose or congestion.  Thick drainage from your nose.  Swelling and warmth over the affected sinuses.  Headache.  Upper toothache.  A cough that may get worse at night.  Extra mucus that collects in the throat or the back of the nose (postnasal drip).  Decreased sense of smell and taste.  Fatigue.  A fever.  Sore throat.  Bad breath. How is this diagnosed? This condition is diagnosed based on:  Your symptoms.  Your medical history.  A physical exam.  Tests to find out if your condition is  acute or chronic. This may include: ? Checking your nose for nasal polyps. ? Viewing your sinuses using a device that has a light (endoscope). ? Testing for allergies or bacteria. ? Imaging tests, such as an MRI or CT scan. In rare cases, a bone biopsy may be done to rule out more serious types of fungal sinus disease. How is this treated? Treatment for sinusitis depends on the cause and whether your condition is chronic or acute.  If caused by a virus, your symptoms should go away on their own within 10 days. You may be given medicines to relieve symptoms. They include: ? Medicines that shrink swollen nasal passages (topical intranasal decongestants). ? Medicines that treat allergies (antihistamines). ? A spray that eases inflammation of the nostrils (topical intranasal corticosteroids). ? Rinses that help get rid of thick mucus in your nose (nasal saline washes).  If caused by bacteria, your health care provider may recommend waiting to see if your symptoms improve. Most bacterial infections will get better without antibiotic medicine. You may be given antibiotics if you have: ? A severe infection. ? A weak immune system.  If caused by narrow nasal passages or nasal polyps, you may need to have surgery. Follow these instructions at home: Medicines  Take, use, or apply over-the-counter and prescription medicines only as told by your health care provider. These may include nasal sprays.  If you were prescribed an antibiotic medicine, take it as told by your health care provider. Do not stop taking the antibiotic even if you start  to feel better. Hydrate and humidify   Drink enough fluid to keep your urine pale yellow. Staying hydrated will help to thin your mucus.  Use a cool mist humidifier to keep the humidity level in your home above 50%.  Inhale steam for 10-15 minutes, 3-4 times a day, or as told by your health care provider. You can do this in the bathroom while a hot shower is  running.  Limit your exposure to cool or dry air. Rest  Rest as much as possible.  Sleep with your head raised (elevated).  Make sure you get enough sleep each night. General instructions   Apply a warm, moist washcloth to your face 3-4 times a day or as told by your health care provider. This will help with discomfort.  Wash your hands often with soap and water to reduce your exposure to germs. If soap and water are not available, use hand sanitizer.  Do not smoke. Avoid being around people who are smoking (secondhand smoke).  Keep all follow-up visits as told by your health care provider. This is important. Contact a health care provider if:  You have a fever.  Your symptoms get worse.  Your symptoms do not improve within 10 days. Get help right away if:  You have a severe headache.  You have persistent vomiting.  You have severe pain or swelling around your face or eyes.  You have vision problems.  You develop confusion.  Your neck is stiff.  You have trouble breathing. Summary  Sinusitis is soreness and inflammation of your sinuses. Sinuses are hollow spaces in the bones around your face.  This condition is caused by nasal tissues that become inflamed or swollen. The swelling traps or blocks the flow of mucus. This allows bacteria, viruses, and fungi to grow, which leads to infection.  If you were prescribed an antibiotic medicine, take it as told by your health care provider. Do not stop taking the antibiotic even if you start to feel better.  Keep all follow-up visits as told by your health care provider. This is important. This information is not intended to replace advice given to you by your health care provider. Make sure you discuss any questions you have with your health care provider. Document Revised: 06/20/2017 Document Reviewed: 06/20/2017 Elsevier Patient Education  [place]. Cough, Adult Coughing is a reflex that clears your throat  and your airways (respiratory system). Coughing helps to heal and protect your lungs. It is normal to cough occasionally, but a cough that happens with other symptoms or lasts a long time may be a sign of a condition that needs treatment. An acute cough may only last 2-3 weeks, while a chronic cough may last 8 or more weeks. Coughing is commonly caused by:  Infection of the respiratory systemby viruses or bacteria.  Breathing in substances that irritate your lungs.  Allergies.  Asthma.  Mucus that runs down the back of your throat (postnasal drip).  Smoking.  Acid backing up from the stomach into the esophagus (gastroesophageal reflux).  Certain medicines.  Chronic lung problems.  Other medical conditions such as heart failure or a blood clot in the lung (pulmonary embolism). Follow these instructions at home: Medicines  Take over-the-counter and prescription medicines only as told by your health care provider.  Talk with your health care provider before you take a cough suppressant medicine. Lifestyle   Avoid cigarette smoke. Do not use any products that contain nicotine or tobacco, such as cigarettes,  e-cigarettes, and chewing tobacco. If you need help quitting, ask your health care provider.  Drink enough fluid to keep your urine pale yellow.  Avoid caffeine.  Do not drink alcohol if your health care provider tells you not to drink. General instructions   Pay close attention to changes in your cough. Tell your health care provider about them.  Always cover your mouth when you cough.  Avoid things that make you cough, such as perfume, candles, cleaning products, or campfire or tobacco smoke.  If the air is dry, use a cool mist vaporizer or humidifier in your bedroom or your home to help loosen secretions.  If your cough is worse at night, try to sleep in a semi-upright position.  Rest as needed.  Keep all follow-up visits as told by your health care provider.  This is important. Contact a health care provider if you:  Have new symptoms.  Cough up pus.  Have a cough that does not get better after 2-3 weeks or gets worse.  Cannot control your cough with cough suppressant medicines and you are losing sleep.  Have pain that gets worse or pain that is not helped with medicine.  Have a fever.  Have unexplained weight loss.  Have night sweats. Get help right away if:  You cough up blood.  You have difficulty breathing.  Your heartbeat is very fast. These symptoms may represent a serious problem that is an emergency. Do not wait to see if the symptoms will go away. Get medical help right away. Call your local emergency services (911 in the [place] Do not drive yourself to the hospital. Summary  Coughing is a reflex that clears your throat and your airways. It is normal to cough occasionally, but a cough that happens with other symptoms or lasts a long time may be a sign of a condition that needs treatment.  Take over-the-counter and prescription medicines only as told by your health care provider.  Always cover your mouth when you cough.  Contact a health care provider if you have new symptoms or a cough that does not get better after 2-3 weeks or gets worse. This information is not intended to replace advice given to you by your health care provider. Make sure you discuss any questions you have with your health care provider. Document Revised: 02/06/2018 Document Reviewed: 02/06/2018 Elsevier Patient Education  [place].

## 2019-06-22 ENCOUNTER — Telehealth: Payer: Self-pay | Admitting: Physician Assistant

## 2019-06-22 ENCOUNTER — Other Ambulatory Visit: Payer: Self-pay | Admitting: Physician Assistant

## 2019-06-22 DIAGNOSIS — J320 Chronic maxillary sinusitis: Secondary | ICD-10-CM

## 2019-06-22 MED ORDER — PREDNISONE 20 MG PO TABS: ORAL_TABLET | ORAL | 0 refills | Status: DC

## 2019-06-22 NOTE — Telephone Encounter (Signed)
Patient was seen a few days ago in our office and discussed ongoing allergies with congestion. She has been taking OTC meds to help but cannot get any relief from the congestion. She is requesting a call back from the clinic staff to discuss some options of what she can do to help. Please advise

## 2019-06-22 NOTE — Telephone Encounter (Signed)
Pt informed of recommendations.  Pt agrees to prednisone taper.  Please send to [facility].  Charyl Bigger, CMA

## 2019-06-22 NOTE — Telephone Encounter (Signed)
Agree with recommendations given and I can send in a short course prednisone taper to see if that helps with the drainage and congestion. She can take 2 of the tessalon pearls if needed, max dose 600 mg/day. Let me know if agreeable to prednisone taper.  Thank you, Herb Grays

## 2019-06-22 NOTE — Telephone Encounter (Signed)
Pt states that she has been taking the amoxicillin and tessalon perles, but is still very congested and that tessalon is not helping with cough.  Pt believes cough is coming from postnasal drainage and states that her cardiologist has previously advised her not to take any hydrocodone containing medications.  Advised pt to try Coricidin HBP for the congestion to see if this alleviates postnasal drainage, therefore alleviating cough.  Please advise if you have any further recommendations or agree with plan of care.  Charyl Bigger, CMA

## 2019-06-28 ENCOUNTER — Telehealth: Payer: Self-pay | Admitting: Physician Assistant

## 2019-06-28 DIAGNOSIS — J320 Chronic maxillary sinusitis: Secondary | ICD-10-CM

## 2019-06-28 NOTE — Telephone Encounter (Signed)
Per patient her symptoms have worsen as the day has progressed & she is wondering what to do -- forwarding updated info to med asst  ( advised pt that our Epic softeware systems were downs for hrs today & we could not do anything frm 11:50am - 2:15pm) --Pt states understands & just wants someone to call with advice before the end of the day if possible.  --Forwarding 2nd message to med asst after advising pt we do ask for 24-48hr for phone message unless an Emergency.   --glh

## 2019-06-28 NOTE — Telephone Encounter (Signed)
Can you inquire about what other slight symptoms patient has? Is she having any red flag symptoms of eye pain with movement, eye swelling, vision changes, neck stiffness? Thank you.

## 2019-06-28 NOTE — Telephone Encounter (Signed)
Pt states that her pain is behind her eyes, under her eyes and this area is sore to touch.  Pt states that she has finished the antibiotic, but she usually takes antibiotics for 10 days d/t her compromised immune system.  Please advise.  Charyl Bigger, CMA

## 2019-06-28 NOTE — Telephone Encounter (Signed)
Please review this message and additional message forwarded.  Please advise.  Charyl Bigger, CMA

## 2019-06-28 NOTE — Telephone Encounter (Signed)
Per patient she has completed all meds given by [facility] on 5/19 but continues to feel sinus pressure with some slight other symptoms ---wishes someone to call her back w/ next step or advice ( per pt she is using steam to temporarily relieve stuffiness )  --Forwarding message to med asst to contact pt @ [phone].  --Dion Body

## 2019-06-29 MED ORDER — LEVOFLOXACIN 500 MG PO TABS: 500.0000 mg | ORAL_TABLET | Freq: Every day | ORAL | 0 refills | Status: DC

## 2019-06-29 NOTE — Telephone Encounter (Signed)
Patient advised of the above message and verbalized understanding but states Flonase does not work for her. That she does not want to go to UC for evaluation or COVID testing and that she just needs a stronger antibiotic.   Spoke with Herb Grays who said she would send patient in Levoquin antibiotic-take as directed.   Patient is aware of this and verbalized understanding. I advised patient if she was not better at the end of treatment to call and schedule appointment. AS, CMA

## 2019-06-29 NOTE — Telephone Encounter (Signed)
Given patient has completed antibiotic and prednisone taper, I would recommend going to UC for further evaluation and Covid-19 testing. Her illness is most likely viral given it hasn't responded to antibiotic and prednisone. She should continue conservative therapy with steam/humidifier, nasal rinses etc. She should follow-up with her oncologist and inquire about IVIG infusion which she would benefit from. She has Cromolyn nasal spray and I can send in rx for flonase which is a corticosteroid nasal spray that helps with nasal congestion.   Thank you, Herb Grays

## 2019-07-16 DIAGNOSIS — Q228 Other congenital malformations of tricuspid valve: Secondary | ICD-10-CM | POA: Insufficient documentation

## 2019-07-16 NOTE — Progress Notes (Signed)
Cardiology Office Note   Date:  07/17/2019   ID:  Kimberly Merritt, DOB 02-15-1940, MRN [id number]  PCP:  Lorrene Reid, PA-C  Cardiologist:   Minus Breeding, MD   Chief Complaint  Patient presents with  . Chest Pain      History of Present Illness: Kimberly Merritt is a 79 y.o. female who presents  for follow up of cardiomyopathy. she has been noted to have an ejection fraction of 30%. This was brought to attention after she was found to have frequent premature ventricular contractions. Cardiac catheterization demonstrated nonobstructive and small vessel disease.   EF was 55% on echo with moderate TR in April 2018.   Since I last saw her she is having some chest discomfort.  This has been going on for couple of months and increasing in intensity.  It seems to be more constant lying down.  She cannot really quantify or qualify it but it is a substernal or left-sided discomfort.  She feels like she has some decreased energy and some mildly increased shortness of breath.  She does some gardening and this does not really make that worse.  She is not describing radiation to her neck or to her arms.  Is not having any new palpitations, presyncope or syncope.  She sleeps with the head of her bed elevated because of reflux but she is not having any new PND or orthopnea.  She is not feeling the PVCs that she had before.   Past Medical History:  Diagnosis Date  . Allergy   . CAD (coronary artery disease)    Nonobstructive. LAD 30% stenosis, small PDA of the circumflex 70% stenosis, right coronary artery 40% stenosis. Catheterization September 2015  . DEPRESSION 08/29/2006  . Diverticulosis   . GERD 08/29/2006  . HEPATIC CYST 08/02/2007  . Hypertension   . HYPOTHYROIDISM 08/29/2006  . LIVER HEMANGIOMA 03/03/2010  . LOW BACK PAIN 08/29/2006  . 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 MUSCLE SPASM 03/03/2010  . Nonischemic cardiomyopathy (HCC)    EF 30%  . OSTEOARTHRITIS 08/29/2006  . OSTEOPENIA  11/19/2008  . PARESTHESIA 03/05/2009  . PVC's (premature ventricular contractions)   . Scoliosis   . VARICOSE VEINS, LOWER EXTREMITIES 08/29/2006    Past Surgical History:  Procedure Laterality Date  . ABDOMINAL HYSTERECTOMY  1981  . APPENDECTOMY    . BACK SURGERY  2010,2006   lumb fusion  . BUNIONECTOMY     both  . BUNIONECTOMY Right 1985  . CARPAL TUNNEL RELEASE     both  . CARPAL TUNNEL RELEASE Right 1992  . CARPAL TUNNEL RELEASE Left 1992  . CATARACT EXTRACTION     both  . CATARACT EXTRACTION Right 2008  . CATARACT EXTRACTION Left 2008  . HAMMER TOE SURGERY Left 1987  . HAMMER TOE SURGERY Right 1986  . LEFT AND RIGHT HEART CATHETERIZATION WITH CORONARY ANGIOGRAM N/A 10/04/2013   Procedure: LEFT AND RIGHT HEART CATHETERIZATION WITH CORONARY ANGIOGRAM;  Surgeon: Leonie Man, MD;  Location[facility] CATH LAB;  Service: Cardiovascular;  Laterality: N/A;  . LUMBAR FUSION  2006  . LUMBAR FUSION  2010  . OOPHORECTOMY  1967  . OVARIAN CYST REMOVAL    . ROTATOR CUFF REPAIR Right 2013  . SHOULDER ARTHROSCOPY  02/09/2011   Procedure: ARTHROSCOPY SHOULDER;  Surgeon: Hessie Dibble, MD;  Location: [place];  Service: Orthopedics;  Laterality: Right;  right shoulder arthroscopy subacromial decompression with rotator cuff repair  . TONSILLECTOMY  Current Outpatient Medications  Medication Sig Dispense Refill  . carvedilol (COREG) 12.5 MG tablet TAKE 1 TABLET BY MOUTH 2 TIMES DAILY. 180 tablet 3  . Cholecalciferol (VITAMIN D3) 50 MCG (2000 UT) capsule Take 1 capsule by mouth daily.     . cromolyn (NASALCROM) 5.2 MG/ACT nasal spray Place 1 spray into both nostrils 4 (four) times daily.    . famotidine (PEPCID) 20 MG tablet Take 1 tablet (20 mg total) by mouth 2 (two) times daily. 180 tablet 0  . fexofenadine (ALLEGRA) 180 MG tablet Take 180 mg by mouth daily.    . Glucosamine-Chondroitin 1500-1200 MG/30ML LIQD Take 1 tablet by mouth daily.     Marland Kitchen levofloxacin  (LEVAQUIN) 500 MG tablet Take 1 tablet (500 mg total) by mouth daily. 5 tablet 0  . losartan (COZAAR) 25 MG tablet Take 1 tablet (25 mg total) by mouth daily.    . pantoprazole (PROTONIX) 40 MG tablet Take 40 mg by mouth daily.    Vladimir Faster Glycol-Propyl Glycol (SYSTANE ULTRA) 0.4-0.3 % SOLN Apply to eye. 2 drops in each eye 3 to 4 times daily    . SALINE NASAL SPRAY NA Place into the nose. 2 sprays each nostril every 4 hours    . SYNTHROID 25 MCG tablet TAKE 1 TABLET BY MOUTH DAILY BEFORE BREAKFAST. 90 tablet 1  . sucralfate (CARAFATE) 1 g tablet Take 1 g by mouth daily as needed.  (Patient not taking: Reported on 07/17/2019)     No current facility-administered medications for this visit.    Allergies:   Acetaminophen, Alendronate sodium, Codeine, Contrast media [iodinated diagnostic agents], Diflunisal, Doxycycline, Flexeril [cyclobenzaprine], Nabumetone, Oxybutynin, Raloxifene, Sulfa antibiotics, Symbicort [budesonide-formoterol fumarate], Tramadol hcl, Tramadol hcl, Diclofenac, and Latex    ROS:  Please see the history of present illness.   Otherwise, review of systems are positive for none.   All other systems are reviewed and negative.    PHYSICAL EXAM: VS:  BP 127/71   Pulse 62   Ht 5\' 5"  (1.651 m)   Wt 133 lb 6.4 oz (60.5 kg)   SpO2 98%   BMI 22.20 kg/m  , BMI Body mass index is 22.2 kg/m. GENERAL:  Well appearing HEENT:  Pupils equal round and reactive, fundi not visualized, oral mucosa unremarkable NECK:  No jugular venous distention, waveform within normal limits, carotid upstroke brisk and symmetric, no bruits, no thyromegaly LYMPHATICS:  No cervical, inguinal adenopathy LUNGS:  Clear to auscultation bilaterally BACK:  No CVA tenderness CHEST:  Unremarkable HEART:  PMI not displaced or sustained,S1 and S2 within normal limits, no S3, no S4, no clicks, no rubs, no murmurs ABD:  Flat, positive bowel sounds normal in frequency in pitch, no bruits, no rebound, no guarding,  no midline pulsatile mass, no hepatomegaly, no splenomegaly EXT:  2 plus pulses throughout, no edema, no cyanosis no clubbing.  Severe arthritic changes.  SKIN:  No rashes no nodules NEURO:  Cranial nerves II through XII grossly intact, motor grossly intact throughout PSYCH:  Cognitively intact, oriented to person place and time    EKG:  EKG is ordered today. The ekg ordered today demonstrates sinus rhythm, rate 58, axis within normal limits, intervals within normal limits, no acute ST-T wave changes.   Recent Labs: 10/23/2018: TSH 1.660 06/12/2019: ALT 16; BUN 16; Creatinine, Ser 0.84; Hemoglobin 11.8; Platelets 305; Potassium 4.4; Sodium 138    Lipid Panel    Component Value Date/Time   CHOL 183 10/23/2018 1110   TRIG  52 10/23/2018 1110   HDL 74 10/23/2018 1110   CHOLHDL 2.5 10/23/2018 1110   CHOLHDL 2 06/08/2011 0830   VLDL 6.4 06/08/2011 0830   LDLCALC 99 10/23/2018 1110   LDLDIRECT 106 (H) 10/23/2018 1110   LDLDIRECT 131.0 02/05/2008 0931      Wt Readings from Last 3 Encounters:  07/17/19 133 lb 6.4 oz (60.5 kg)  06/20/19 127 lb 12.8 oz (58 kg)  06/18/19 127 lb 14.4 oz (58 kg)      Other studies Reviewed: Additional studies/ records that were reviewed today include: None. Review of the above records demonstrates:  Please see elsewhere in the note.     ASSESSMENT AND PLAN:  CARDIOMYPATHY:   Her last ejection fraction was 55 to 60%.  I do not suspect this to be any lower.  She seems to be euvolemic.  No change in therapy.  CHEST PAIN: Given this and her previous nonobstructive disease I need to exclude obstructive coronary disease as an etiology although her pain is atypical.  I will order a Lexiscan Myoview.  She would not be able to walk on a treadmill.  HTN:       The blood pressure is controlled.  No change in therapy.  PVCs:    She has had no symptoms.  No change in therapy.  TR:    This was moderate on echo in 2018 but I would not suspect this is severe  by her exam and I will follow this clinically.  Given her shortness of breath I will check a BNP level.  COVID EDUCATION: She has had her vaccine.  Current medicines are reviewed at length with the patient today.  The patient does not have concerns regarding medicines.  The following changes have been made:  no change  Labs/ tests ordered today include:   Orders Placed This Encounter  Procedures  . Brain natriuretic peptide  . MYOCARDIAL PERFUSION IMAGING  . EKG 12-Lead     Disposition:   FU with APP in two months.     Signed, Minus Breeding, MD  07/17/2019 5:17 PM    [place]

## 2019-07-17 ENCOUNTER — Encounter: Payer: Self-pay | Admitting: Cardiology

## 2019-07-17 ENCOUNTER — Ambulatory Visit: Payer: Medicare Other | Admitting: Cardiology

## 2019-07-17 ENCOUNTER — Other Ambulatory Visit: Payer: Self-pay

## 2019-07-17 VITALS — BP 127/71 | HR 62 | Ht 65.0 in | Wt 133.4 lb

## 2019-07-17 DIAGNOSIS — Q228 Other congenital malformations of tricuspid valve: Secondary | ICD-10-CM

## 2019-07-17 DIAGNOSIS — I493 Ventricular premature depolarization: Secondary | ICD-10-CM | POA: Diagnosis not present

## 2019-07-17 DIAGNOSIS — I428 Other cardiomyopathies: Secondary | ICD-10-CM

## 2019-07-17 DIAGNOSIS — I10 Essential (primary) hypertension: Secondary | ICD-10-CM | POA: Diagnosis not present

## 2019-07-17 DIAGNOSIS — Z7189 Other specified counseling: Secondary | ICD-10-CM | POA: Diagnosis not present

## 2019-07-17 DIAGNOSIS — R0602 Shortness of breath: Secondary | ICD-10-CM

## 2019-07-17 DIAGNOSIS — R072 Precordial pain: Secondary | ICD-10-CM

## 2019-07-17 NOTE — Patient Instructions (Addendum)
Medication Instructions:  NO CHANGES *If you need a refill on your cardiac medications before your next appointment, please call your pharmacy*  Lab Work: Your physician recommends that you return for lab work TODAY (BNP) If you have labs (blood work) drawn today and your tests are completely normal, you will receive your results only by: Marland Kitchen MyChart Message (if you have MyChart) OR . A paper copy in the mail If you have any lab test that is abnormal or we need to change your treatment, we will call you to review the results.  Testing/Procedures: Your physician has requested that you have a lexiscan myoview. For further information please visit [email or web address]. Please follow instruction sheet, as given.  Follow-Up: At [facility], you and your health needs are our priority.  As part of our continuing mission to provide you with exceptional heart care, we have created designated Provider Care Teams.  These Care Teams include your primary Cardiologist (physician) and Advanced Practice Providers (APPs -  Physician Assistants and Nurse Practitioners) who all work together to provide you with the care you need, when you need it.  We recommend signing up for the patient portal called "MyChart".  Sign up information is provided on this After Visit Summary.  MyChart is used to connect with patients for Virtual Visits (Telemedicine).  Patients are able to view lab/test results, encounter notes, upcoming appointments, etc.  Non-urgent messages can be sent to your provider as well.   To learn more about what you can do with MyChart, go to [email or web address].    Your next appointment:   3 month(s)  The format for your next appointment:   In Person  Provider:   ANY AVAILABLE APP  Other Instructions Your physician has requested that you have a lexiscan myoview. For further information please visit [email or web address]. Please follow instruction sheet, as given. This will take place at [place], [address]  How to prepare for your Myocardial Perfusion Test:  Do not eat or drink 3 hours prior to your test, except you may have water.  Do not consume products containing caffeine (regular or decaffeinated) 12 hours prior to your test. (ex: coffee, chocolate, sodas, tea).  Do bring a list of your current medications with you.  If not listed below, you may take your medications as normal.  Do wear comfortable clothes (no dresses or overalls) and walking shoes, tennis shoes preferred (No heels or open toe shoes are allowed).  Do NOT wear cologne, perfume, aftershave, or lotions (deodorant is allowed).  The test will take approximately 3 to 4 hours to complete  If these instructions are not followed, your test will have to be rescheduled.

## 2019-07-18 ENCOUNTER — Telehealth (HOSPITAL_COMMUNITY): Payer: Self-pay

## 2019-07-18 NOTE — Telephone Encounter (Signed)
Encounter complete. 

## 2019-07-20 ENCOUNTER — Other Ambulatory Visit: Payer: Self-pay | Admitting: Family Medicine

## 2019-07-20 ENCOUNTER — Other Ambulatory Visit: Payer: Self-pay | Admitting: Physician Assistant

## 2019-07-20 ENCOUNTER — Other Ambulatory Visit: Payer: Self-pay

## 2019-07-20 ENCOUNTER — Ambulatory Visit (HOSPITAL_COMMUNITY)
Admission: RE | Admit: 2019-07-20 | Discharge: 2019-07-20 | Disposition: A | Discharge status: Home / Self Care | Payer: Medicare Other | Source: Ambulatory Visit | Attending: Cardiovascular Disease | Admitting: Cardiovascular Disease

## 2019-07-20 DIAGNOSIS — R072 Precordial pain: Secondary | ICD-10-CM | POA: Diagnosis not present

## 2019-07-20 DIAGNOSIS — E039 Hypothyroidism, unspecified: Secondary | ICD-10-CM

## 2019-07-20 LAB — MYOCARDIAL PERFUSION IMAGING
LV dias vol: 103 mL (ref 46–106)
LV sys vol: 54 mL
Peak HR: 76 {beats}/min
Rest HR: 52 {beats}/min
SDS: 1
SRS: 1
SSS: 2
TID: 1.16

## 2019-07-20 MED ORDER — REGADENOSON 0.4 MG/5ML IV SOLN
0.4000 mg | Freq: Once | INTRAVENOUS | Status: AC
  Administered 2019-07-20: 0.4 mg via INTRAVENOUS

## 2019-07-20 MED ORDER — SYNTHROID 25 MCG PO TABS: ORAL_TABLET | ORAL | 0 refills | Status: DC

## 2019-07-20 MED ORDER — TECHNETIUM TC 99M TETROFOSMIN IV KIT
31.8000 | PACK | Freq: Once | INTRAVENOUS | Status: AC | PRN
  Administered 2019-07-20: 31.8 via INTRAVENOUS
  Filled 2019-07-20: qty 32

## 2019-07-20 MED ORDER — TECHNETIUM TC 99M TETROFOSMIN IV KIT
10.5000 | PACK | Freq: Once | INTRAVENOUS | Status: AC | PRN
  Administered 2019-07-20: 10.5 via INTRAVENOUS
  Filled 2019-07-20: qty 11

## 2019-07-20 NOTE — Telephone Encounter (Signed)
Pt called states pharmacy says no response fr refill request of :   SYNTHROID 25 MCG tablet [[id number]]   Order Details Dose, Route, Frequency: As Directed  Dispense Quantity: 90 tablet Refills: 1       Sig: TAKE 1 TABLET BY MOUTH DAILY BEFORE BREAKFAST.      Start Date: 01/22/19 End Date: --  Written Date: 01/22/19 Expiration Date: 01/22/20    Diagnosis Association: Hypothyroidism, unspecified type (E03.9)  Original Order:  SYNTHROID 25 MCG tablet [[id number]]  Providers  ---  (advised pt refill request sent to Dr. Raliegh Scarlet who is no longer PCP nor @ [facility]) sending request to med asst for refill review & if approved send order to  :  [place], [place]  [address] [place] [address]  Phone:  [phone] Fax:  [phone]   --Dion Body

## 2019-07-23 ENCOUNTER — Telehealth: Payer: Self-pay

## 2019-07-23 DIAGNOSIS — I428 Other cardiomyopathies: Secondary | ICD-10-CM

## 2019-07-23 NOTE — Telephone Encounter (Signed)
-----   Message from Minus Breeding, MD sent at 07/21/2019  9:36 AM EDT ----- No evidence of ischemia or infarct.  Her EF might be a little lower and we need to check an echocardiogram.   Call [name] with the results and send results to Lorrene Reid, PA-C

## 2019-08-03 DIAGNOSIS — K219 Gastro-esophageal reflux disease without esophagitis: Secondary | ICD-10-CM | POA: Diagnosis not present

## 2019-08-08 DIAGNOSIS — L820 Inflamed seborrheic keratosis: Secondary | ICD-10-CM | POA: Diagnosis not present

## 2019-08-08 DIAGNOSIS — Z08 Encounter for follow-up examination after completed treatment for malignant neoplasm: Secondary | ICD-10-CM | POA: Diagnosis not present

## 2019-08-08 DIAGNOSIS — L818 Other specified disorders of pigmentation: Secondary | ICD-10-CM | POA: Diagnosis not present

## 2019-08-08 DIAGNOSIS — D1801 Hemangioma of skin and subcutaneous tissue: Secondary | ICD-10-CM | POA: Diagnosis not present

## 2019-08-08 DIAGNOSIS — L821 Other seborrheic keratosis: Secondary | ICD-10-CM | POA: Diagnosis not present

## 2019-08-08 DIAGNOSIS — Z85828 Personal history of other malignant neoplasm of skin: Secondary | ICD-10-CM | POA: Diagnosis not present

## 2019-08-14 ENCOUNTER — Ambulatory Visit (HOSPITAL_COMMUNITY): Payer: Medicare Other | Attending: Cardiology

## 2019-08-14 ENCOUNTER — Other Ambulatory Visit: Payer: Self-pay

## 2019-08-14 DIAGNOSIS — I428 Other cardiomyopathies: Secondary | ICD-10-CM | POA: Insufficient documentation

## 2019-08-17 ENCOUNTER — Ambulatory Visit: Payer: Medicare Other | Admitting: Cardiology

## 2019-09-06 ENCOUNTER — Other Ambulatory Visit: Payer: Self-pay | Admitting: Cardiology

## 2019-09-20 ENCOUNTER — Ambulatory Visit: Payer: Medicare Other | Admitting: Physician Assistant

## 2019-10-12 ENCOUNTER — Other Ambulatory Visit: Payer: Self-pay | Admitting: Oncology

## 2019-10-12 DIAGNOSIS — Z1231 Encounter for screening mammogram for malignant neoplasm of breast: Secondary | ICD-10-CM

## 2019-10-17 DIAGNOSIS — R072 Precordial pain: Secondary | ICD-10-CM | POA: Insufficient documentation

## 2019-10-17 NOTE — Progress Notes (Signed)
Cardiology Office Note   Date:  10/18/2019   ID:  Kimberly Merritt, DOB Jun 02, 1940, MRN [id number]  PCP:  Lorrene Reid, PA-C  Cardiologist:   Minus Breeding, MD   Chief Complaint  Patient presents with  . Chest Pain      History of Present Illness: Kimberly Merritt is a 79 y.o. female who presents  for follow up of cardiomyopathy. she has been noted to have an ejection fraction of 30%. This was brought to attention after she was found to have frequent premature ventricular contractions. Cardiac catheterization demonstrated nonobstructive and small vessel disease.   EF was 55% on echo with moderate TR in April 2018.  At the last visit she had chest pain.  However, she had a negative Lexiscan Myoview.   Her EF was well preserved on echo.    Since I last saw her she has had no new cardiovascular complaints.  She has had some leg pain particularly in her left lower leg.  She has had a little mild edema and she is put on compression stockings.  She says that the chest pain she was having was taking care of by taking Prilosec and seeing her gastroenterologist.  She is not having any palpitations, presyncope or syncope.  She is not having any shortness of breath, PND or orthopnea.  She worked in the yard and has been doing that quite a bit.   Past Medical History:  Diagnosis Date  . Allergy   . CAD (coronary artery disease)    Nonobstructive. LAD 30% stenosis, small PDA of the circumflex 70% stenosis, right coronary artery 40% stenosis. Catheterization September 2015  . DEPRESSION 08/29/2006  . Diverticulosis   . GERD 08/29/2006  . HEPATIC CYST 08/02/2007  . Hypertension   . HYPOTHYROIDISM 08/29/2006  . LIVER HEMANGIOMA 03/03/2010  . LOW BACK PAIN 08/29/2006  . 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 MUSCLE SPASM 03/03/2010  . Nonischemic cardiomyopathy (HCC)    EF 30%  . OSTEOARTHRITIS 08/29/2006  . OSTEOPENIA 11/19/2008  . PARESTHESIA 03/05/2009  . PVC's (premature ventricular  contractions)   . Scoliosis   . VARICOSE VEINS, LOWER EXTREMITIES 08/29/2006    Past Surgical History:  Procedure Laterality Date  . ABDOMINAL HYSTERECTOMY  1981  . APPENDECTOMY    . BACK SURGERY  2010,2006   lumb fusion  . BUNIONECTOMY     both  . BUNIONECTOMY Right 1985  . CARPAL TUNNEL RELEASE     both  . CARPAL TUNNEL RELEASE Right 1992  . CARPAL TUNNEL RELEASE Left 1992  . CATARACT EXTRACTION     both  . CATARACT EXTRACTION Right 2008  . CATARACT EXTRACTION Left 2008  . HAMMER TOE SURGERY Left 1987  . HAMMER TOE SURGERY Right 1986  . LEFT AND RIGHT HEART CATHETERIZATION WITH CORONARY ANGIOGRAM N/A 10/04/2013   Procedure: LEFT AND RIGHT HEART CATHETERIZATION WITH CORONARY ANGIOGRAM;  Surgeon: Leonie Man, MD;  Location: [facility] CATH LAB;  Service: Cardiovascular;  Laterality: N/A;  . LUMBAR FUSION  2006  . LUMBAR FUSION  2010  . OOPHORECTOMY  1967  . OVARIAN CYST REMOVAL    . ROTATOR CUFF REPAIR Right 2013  . SHOULDER ARTHROSCOPY  02/09/2011   Procedure: ARTHROSCOPY SHOULDER;  Surgeon: Hessie Dibble, MD;  Location: [place];  Service: Orthopedics;  Laterality: Right;  right shoulder arthroscopy subacromial decompression with rotator cuff repair  . TONSILLECTOMY       Current Outpatient Medications  Medication Sig Dispense  Refill  . carvedilol (COREG) 12.5 MG tablet TAKE 1 TABLET BY MOUTH 2 TIMES DAILY. 180 tablet 3  . Cholecalciferol (VITAMIN D3) 50 MCG (2000 UT) capsule Take 1 capsule by mouth daily.     . cromolyn (NASALCROM) 5.2 MG/ACT nasal spray Place 1 spray into both nostrils 4 (four) times daily.    . fexofenadine (ALLEGRA) 180 MG tablet Take 180 mg by mouth daily.    . Glucosamine-Chondroitin 1500-1200 MG/30ML LIQD Take 1 tablet by mouth daily.     Marland Kitchen losartan (COZAAR) 25 MG tablet TAKE 1 TABLET BY MOUTH DAILY. 90 tablet 2  . pantoprazole (PROTONIX) 40 MG tablet Take 40 mg by mouth daily.    Vladimir Faster Glycol-Propyl Glycol (SYSTANE ULTRA)  0.4-0.3 % SOLN Apply to eye. 2 drops in each eye 3 to 4 times daily    . SALINE NASAL SPRAY NA Place into the nose. 2 sprays each nostril every 4 hours    . sucralfate (CARAFATE) 1 g tablet Take 1 g by mouth daily as needed.     Marland Kitchen SYNTHROID 25 MCG tablet TAKE 1 TABLET BY MOUTH DAILY BEFORE BREAKFAST.**NEEDS APT FOR FURTHER REFILLS** 90 tablet 0   No current facility-administered medications for this visit.    Allergies:   Acetaminophen, Alendronate sodium, Codeine, Contrast media [iodinated diagnostic agents], Diflunisal, Doxycycline, Flexeril [cyclobenzaprine], Nabumetone, Oxybutynin, Raloxifene, Sulfa antibiotics, Symbicort [budesonide-formoterol fumarate], Tramadol hcl, Tramadol hcl, Diclofenac, and Latex    ROS:  Please see the history of present illness.   Otherwise, review of systems are positive for none.   All other systems are reviewed and negative.    PHYSICAL EXAM: VS:  BP 120/62   Pulse (!) 58   Temp (!) 97.5 F (36.4 C)   Ht 5\' 2"  (1.575 m)   Wt 131 lb 3.2 oz (59.5 kg)   SpO2 98%   BMI 24.00 kg/m  , BMI Body mass index is 24 kg/m. GENERAL:  Well appearing NECK:  No jugular venous distention, waveform within normal limits, carotid upstroke brisk and symmetric, no bruits, no thyromegaly LUNGS:  Clear to auscultation bilaterally CHEST:  Unremarkable HEART:  PMI not displaced or sustained,S1 and S2 within normal limits, no S3, no S4, no clicks, no rubs, no murmurs ABD:  Flat, positive bowel sounds normal in frequency in pitch, no bruits, no rebound, no guarding, no midline pulsatile mass, no hepatomegaly, no splenomegaly EXT:  2 plus pulses throughout, no edema, no cyanosis no clubbing   EKG:  EKG is not ordered today.   Recent Labs: 10/23/2018: TSH 1.660 06/12/2019: ALT 16; BUN 16; Creatinine, Ser 0.84; Hemoglobin 11.8; Platelets 305; Potassium 4.4; Sodium 138    Lipid Panel    Component Value Date/Time   CHOL 183 10/23/2018 1110   TRIG 52 10/23/2018 1110   HDL  74 10/23/2018 1110   CHOLHDL 2.5 10/23/2018 1110   CHOLHDL 2 06/08/2011 0830   VLDL 6.4 06/08/2011 0830   LDLCALC 99 10/23/2018 1110   LDLDIRECT 106 (H) 10/23/2018 1110   LDLDIRECT 131.0 02/05/2008 0931      Wt Readings from Last 3 Encounters:  10/18/19 131 lb 3.2 oz (59.5 kg)  07/20/19 133 lb (60.3 kg)  07/17/19 133 lb 6.4 oz (60.5 kg)      Other studies Reviewed: Additional studies/ records that were reviewed today include:   Echo and Lexiscan Myoview.. Review of the above records demonstrates:  Please see elsewhere in the note.     ASSESSMENT AND PLAN:  CARDIOMYPATHY:  Her last ejection fraction was 60%.    No change in therapy.  She tolerates the meds as listed.  CHEST PAIN:  She had a negative Lexiscan Myoview.  This improved with GI therapy.  No change in therapy.  HTN:       The blood pressure is at target.  No change in therapy.   PVCs:    She is having symptoms with this.  No change in therapy.  TR:     This was mild on echo.  No change in therapy.   COVID EDUCATION: She has had her vaccine.  Current medicines are reviewed at length with the patient today.  The patient does not have concerns regarding medicines.  The following changes have been made:   None  Labs/ tests ordered today include: None  No orders of the defined types were placed in this encounter.    Disposition:   FU with me in 12 months.    Signed, Minus Breeding, MD  10/18/2019 12:25 PM    [facility]

## 2019-10-18 ENCOUNTER — Ambulatory Visit: Payer: Medicare Other | Admitting: Cardiology

## 2019-10-18 ENCOUNTER — Encounter: Payer: Self-pay | Admitting: Cardiology

## 2019-10-18 ENCOUNTER — Telehealth: Payer: Self-pay | Admitting: Physician Assistant

## 2019-10-18 ENCOUNTER — Other Ambulatory Visit: Payer: Self-pay

## 2019-10-18 ENCOUNTER — Other Ambulatory Visit: Payer: Self-pay | Admitting: Physician Assistant

## 2019-10-18 VITALS — BP 120/62 | HR 58 | Temp 97.5°F | Ht 62.0 in | Wt 131.2 lb

## 2019-10-18 DIAGNOSIS — R072 Precordial pain: Secondary | ICD-10-CM

## 2019-10-18 DIAGNOSIS — E039 Hypothyroidism, unspecified: Secondary | ICD-10-CM

## 2019-10-18 DIAGNOSIS — I10 Essential (primary) hypertension: Secondary | ICD-10-CM | POA: Diagnosis not present

## 2019-10-18 DIAGNOSIS — I493 Ventricular premature depolarization: Secondary | ICD-10-CM

## 2019-10-18 NOTE — Telephone Encounter (Signed)
Please call patient to schedule apt for further refills. AS, CMA 

## 2019-10-18 NOTE — Patient Instructions (Signed)

## 2019-11-13 ENCOUNTER — Other Ambulatory Visit: Payer: Self-pay

## 2019-11-13 DIAGNOSIS — I83892 Varicose veins of left lower extremities with other complications: Secondary | ICD-10-CM

## 2019-11-13 DIAGNOSIS — I2510 Atherosclerotic heart disease of native coronary artery without angina pectoris: Secondary | ICD-10-CM

## 2019-11-14 ENCOUNTER — Encounter: Payer: Self-pay | Admitting: Vascular Surgery

## 2019-11-14 ENCOUNTER — Ambulatory Visit (HOSPITAL_COMMUNITY)
Admission: RE | Admit: 2019-11-14 | Discharge: 2019-11-14 | Disposition: A | Discharge status: Home / Self Care | Payer: Medicare Other | Source: Ambulatory Visit | Attending: Vascular Surgery | Admitting: Vascular Surgery

## 2019-11-14 ENCOUNTER — Other Ambulatory Visit: Payer: Self-pay

## 2019-11-14 ENCOUNTER — Ambulatory Visit (INDEPENDENT_AMBULATORY_CARE_PROVIDER_SITE_OTHER)
Admission: RE | Admit: 2019-11-14 | Discharge: 2019-11-14 | Disposition: A | Discharge status: Home / Self Care | Payer: Medicare Other | Source: Ambulatory Visit | Attending: Vascular Surgery | Admitting: Vascular Surgery

## 2019-11-14 ENCOUNTER — Ambulatory Visit: Payer: Medicare Other | Admitting: Vascular Surgery

## 2019-11-14 VITALS — BP 138/83 | HR 60 | Temp 98.0°F | Resp 20 | Ht 62.0 in | Wt 131.0 lb

## 2019-11-14 DIAGNOSIS — I83892 Varicose veins of left lower extremities with other complications: Secondary | ICD-10-CM

## 2019-11-14 DIAGNOSIS — I8393 Asymptomatic varicose veins of bilateral lower extremities: Secondary | ICD-10-CM

## 2019-11-14 DIAGNOSIS — I2510 Atherosclerotic heart disease of native coronary artery without angina pectoris: Secondary | ICD-10-CM | POA: Insufficient documentation

## 2019-11-14 DIAGNOSIS — I83813 Varicose veins of bilateral lower extremities with pain: Secondary | ICD-10-CM | POA: Diagnosis not present

## 2019-11-14 DIAGNOSIS — I872 Venous insufficiency (chronic) (peripheral): Secondary | ICD-10-CM | POA: Diagnosis not present
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
## 2019-11-15 ENCOUNTER — Ambulatory Visit: Payer: Medicare Other

## 2019-11-15 ENCOUNTER — Other Ambulatory Visit: Payer: Self-pay | Admitting: *Deleted

## 2019-11-15 DIAGNOSIS — I872 Venous insufficiency (chronic) (peripheral): Secondary | ICD-10-CM

## 2019-12-04 ENCOUNTER — Inpatient Hospital Stay: Payer: Medicare Other | Attending: Oncology

## 2019-12-04 ENCOUNTER — Inpatient Hospital Stay: Payer: Medicare Other

## 2019-12-04 ENCOUNTER — Ambulatory Visit: Payer: Medicare Other

## 2019-12-04 ENCOUNTER — Other Ambulatory Visit: Payer: Self-pay

## 2019-12-04 ENCOUNTER — Other Ambulatory Visit: Payer: Medicare Other

## 2019-12-04 VITALS — BP 136/70 | HR 69 | Temp 97.8°F | Resp 18 | Wt 131.5 lb

## 2019-12-04 DIAGNOSIS — M899 Disorder of bone, unspecified: Secondary | ICD-10-CM

## 2019-12-04 DIAGNOSIS — R053 Chronic cough: Secondary | ICD-10-CM

## 2019-12-04 DIAGNOSIS — I429 Cardiomyopathy, unspecified: Secondary | ICD-10-CM | POA: Diagnosis not present

## 2019-12-04 DIAGNOSIS — D472 Monoclonal gammopathy: Secondary | ICD-10-CM

## 2019-12-04 DIAGNOSIS — D801 Nonfamilial hypogammaglobulinemia: Secondary | ICD-10-CM | POA: Diagnosis not present

## 2019-12-04 DIAGNOSIS — K130 Diseases of lips: Secondary | ICD-10-CM

## 2019-12-04 LAB — COMPREHENSIVE METABOLIC PANEL
ALT: 17 U/L (ref 0–44)
AST: 21 U/L (ref 15–41)
Albumin: 3.7 g/dL (ref 3.5–5.0)
Alkaline Phosphatase: 77 U/L (ref 38–126)
Anion gap: 9 (ref 5–15)
BUN: 17 mg/dL (ref 8–23)
CO2: 26 mmol/L (ref 22–32)
Calcium: 9.1 mg/dL (ref 8.9–10.3)
Chloride: 100 mmol/L (ref 98–111)
Creatinine, Ser: 0.79 mg/dL (ref 0.44–1.00)
GFR, Estimated: >60 mL/min (ref >60)
Glucose, Bld: 96 mg/dL (ref 70–99)
Potassium: 4.3 mmol/L (ref 3.5–5.1)
Sodium: 135 mmol/L (ref 135–145)
Total Bilirubin: 0.4 mg/dL (ref 0.3–1.2)
Total Protein: 7.2 g/dL (ref 6.5–8.1)

## 2019-12-04 LAB — CBC WITH DIFFERENTIAL/PLATELET
Abs Immature Granulocytes: 0.03 10*3/uL (ref 0.00–0.07)
Basophils Absolute: 0.1 10*3/uL (ref 0.0–0.1)
Basophils Relative: 1 %
Eosinophils Absolute: 0.2 10*3/uL (ref 0.0–0.5)
Eosinophils Relative: 2 %
HCT: 38.2 % (ref 36.0–46.0)
Hemoglobin: 12.6 g/dL (ref 12.0–15.0)
Immature Granulocytes: 0 %
Lymphocytes Relative: 39 %
Lymphs Abs: 3.5 10*3/uL (ref 0.7–4.0)
MCH: 30.8 pg (ref 26.0–34.0)
MCHC: 33 g/dL (ref 30.0–36.0)
MCV: 93.4 fL (ref 80.0–100.0)
Monocytes Absolute: 1.2 10*3/uL — ABNORMAL HIGH (ref 0.1–1.0)
Monocytes Relative: 13 %
Neutro Abs: 4 10*3/uL (ref 1.7–7.7)
Neutrophils Relative %: 45 %
Platelets: 305 10*3/uL (ref 150–400)
RBC: 4.09 MIL/uL (ref 3.87–5.11)
RDW: 12 % (ref 11.5–15.5)
WBC: 8.9 10*3/uL (ref 4.0–10.5)
nRBC: 0 % (ref 0.0–0.2)

## 2019-12-04 MED ORDER — IMMUNE GLOBULIN (HUMAN) 20 GM/200ML IV SOLN
1.0000 g/kg | Freq: Once | INTRAVENOUS | Status: AC
  Administered 2019-12-04: 60 g via INTRAVENOUS
  Filled 2019-12-04: qty 200

## 2019-12-04 NOTE — Progress Notes (Signed)
Per [place] in pharmacy, advised to run IVIG at max rate of 480mg /kg/hr based on a primary immunodeficiency diagnosis.

## 2019-12-04 NOTE — Patient Instructions (Signed)
Immune Globulin Injection What is this medicine? IMMUNE GLOBULIN (im MUNE GLOB yoo lin) helps to prevent or reduce the severity of certain infections in patients who are at risk. This medicine is collected from the pooled blood of many donors. It is used to treat immune system problems, thrombocytopenia, and Kawasaki syndrome. This medicine may be used for other purposes; ask your health care provider or pharmacist if you have questions. COMMON BRAND NAME(S): ASCENIV, Baygam, BIVIGAM, Carimune, Carimune NF, cutaquig, Cuvitru, Flebogamma, Flebogamma DIF, GamaSTAN, GamaSTAN S/D, Gamimune N, Gammagard, Gammagard S/D, Gammaked, Gammaplex, Gammar-P IV, Gamunex, Gamunex-C, Hizentra, Iveegam, Iveegam EN, Octagam, Panglobulin, Panglobulin NF, panzyga, Polygam S/D, Privigen, Sandoglobulin, Venoglobulin-S, Vigam, Vivaglobulin, Xembify What should I tell my health care provider before I take this medicine? They need to know if you have any of these conditions:  diabetes  extremely low or no immune antibodies in the blood  heart disease  history of blood clots  hyperprolinemia  infection in the blood, sepsis  kidney disease  recently received or scheduled to receive a vaccination  an unusual or allergic reaction to human immune globulin, albumin, maltose, sucrose, other medicines, foods, dyes, or preservatives  pregnant or trying to get pregnant  breast-feeding How should I use this medicine? This medicine is for injection into a muscle or infusion into a vein or skin. It is usually given by a health care professional in a hospital or clinic setting. In rare cases, some brands of this medicine might be given at home. You will be taught how to give this medicine. Use exactly as directed. Take your medicine at regular intervals. Do not take your medicine more often than directed. Talk to your pediatrician regarding the use of this medicine in children. While this drug may be prescribed for selected  conditions, precautions do apply. Overdosage: If you think you have taken too much of this medicine contact a poison control center or emergency room at once. NOTE: This medicine is only for you. Do not share this medicine with others. What if I miss a dose? It is important not to miss your dose. Call your doctor or health care professional if you are unable to keep an appointment. If you give yourself the medicine and you miss a dose, take it as soon as you can. If it is almost time for your next dose, take only that dose. Do not take double or extra doses. What may interact with this medicine?  aspirin and aspirin-like medicines  cisplatin  cyclosporine  medicines for infection like acyclovir, adefovir, amphotericin B, bacitracin, cidofovir, foscarnet, ganciclovir, gentamicin, pentamidine, vancomycin  NSAIDS, medicines for pain and inflammation, like ibuprofen or naproxen  pamidronate  vaccines  zoledronic acid This list may not describe all possible interactions. Give your health care provider a list of all the medicines, herbs, non-prescription drugs, or dietary supplements you use. Also tell them if you smoke, drink alcohol, or use illegal drugs. Some items may interact with your medicine. What should I watch for while using this medicine? Your condition will be monitored carefully while you are receiving this medicine. This medicine is made from pooled blood donations of many different people. It may be possible to pass an infection in this medicine. However, the donors are screened for infections and all products are tested for HIV and hepatitis. The medicine is treated to kill most or all bacteria and viruses. Talk to your doctor about the risks and benefits of this medicine. Do not have vaccinations for at least   14 days before, or until at least 3 months after receiving this medicine. What side effects may I notice from receiving this medicine? Side effects that you should report  to your doctor or health care professional as soon as possible:  allergic reactions like skin rash, itching or hives, swelling of the face, lips, or tongue  blue colored lips or skin  breathing problems  chest pain or tightness  fever  signs and symptoms of aseptic meningitis such as stiff neck; sensitivity to light; headache; drowsiness; fever; nausea; vomiting; rash  signs and symptoms of a blood clot such as chest pain; shortness of breath; pain, swelling, or warmth in the leg  signs and symptoms of hemolytic anemia such as fast heartbeat; tiredness; dark yellow or brown urine; or yellowing of the eyes or skin  signs and symptoms of kidney injury like trouble passing urine or change in the amount of urine  sudden weight gain  swelling of the ankles, feet, hands Side effects that usually do not require medical attention (report to your doctor or health care professional if they continue or are bothersome):  diarrhea  flushing  headache  increased sweating  joint pain  muscle cramps  muscle pain  nausea  pain, redness, or irritation at site where injected  tiredness This list may not describe all possible side effects. Call your doctor for medical advice about side effects. You may report side effects to FDA at 1-800-FDA-1088. Where should I keep my medicine? Keep out of the reach of children. This drug is usually given in a hospital or clinic and will not be stored at home. In rare cases, some brands of this medicine may be given at home. If you are using this medicine at home, you will be instructed on how to store this medicine. Throw away any unused medicine after the expiration date on the label. NOTE: This sheet is a summary. It may not cover all possible information. If you have questions about this medicine, talk to your doctor, pharmacist, or health care provider.  2020 Elsevier/Gold Standard (2018-08-23 12:51:14)  

## 2019-12-05 LAB — MULTIPLE MYELOMA PANEL, SERUM
Albumin SerPl Elph-Mcnc: 3.8 g/dL (ref 2.9–4.4)
Albumin/Glob SerPl: 1.2 (ref 0.7–1.7)
Alpha 1: 0.2 g/dL (ref 0.0–0.4)
Alpha2 Glob SerPl Elph-Mcnc: 0.7 g/dL (ref 0.4–1.0)
B-Globulin SerPl Elph-Mcnc: 2 g/dL — ABNORMAL HIGH (ref 0.7–1.3)
Gamma Glob SerPl Elph-Mcnc: 0.3 g/dL — ABNORMAL LOW (ref 0.4–1.8)
Globulin, Total: 3.3 g/dL (ref 2.2–3.9)
IgA: 14 mg/dL — ABNORMAL LOW (ref 64–422)
IgG (Immunoglobin G), Serum: 368 mg/dL — ABNORMAL LOW (ref 586–1602)
IgM (Immunoglobulin M), Srm: 1687 mg/dL — ABNORMAL HIGH (ref 26–217)
M Protein SerPl Elph-Mcnc: 1.3 g/dL — ABNORMAL HIGH
Total Protein ELP: 7.1 g/dL (ref 6.0–8.5)

## 2019-12-11 DIAGNOSIS — H353122 Nonexudative age-related macular degeneration, left eye, intermediate dry stage: Secondary | ICD-10-CM | POA: Diagnosis not present

## 2019-12-11 DIAGNOSIS — H52202 Unspecified astigmatism, left eye: Secondary | ICD-10-CM | POA: Diagnosis not present

## 2019-12-11 DIAGNOSIS — Z961 Presence of intraocular lens: Secondary | ICD-10-CM | POA: Diagnosis not present

## 2019-12-14 DIAGNOSIS — M25512 Pain in left shoulder: Secondary | ICD-10-CM | POA: Diagnosis not present

## 2019-12-18 ENCOUNTER — Encounter (INDEPENDENT_AMBULATORY_CARE_PROVIDER_SITE_OTHER): Payer: Medicare Other | Admitting: Ophthalmology

## 2020-01-09 ENCOUNTER — Encounter (INDEPENDENT_AMBULATORY_CARE_PROVIDER_SITE_OTHER): Payer: Self-pay | Admitting: Ophthalmology

## 2020-01-09 ENCOUNTER — Ambulatory Visit (INDEPENDENT_AMBULATORY_CARE_PROVIDER_SITE_OTHER): Payer: Medicare Other | Admitting: Ophthalmology

## 2020-01-09 ENCOUNTER — Other Ambulatory Visit: Payer: Self-pay

## 2020-01-09 DIAGNOSIS — H353132 Nonexudative age-related macular degeneration, bilateral, intermediate dry stage: Secondary | ICD-10-CM | POA: Diagnosis not present

## 2020-01-09 DIAGNOSIS — H4312 Vitreous hemorrhage, left eye: Secondary | ICD-10-CM | POA: Diagnosis not present

## 2020-01-09 DIAGNOSIS — H35363 Drusen (degenerative) of macula, bilateral: Secondary | ICD-10-CM | POA: Diagnosis not present

## 2020-01-09 DIAGNOSIS — H43811 Vitreous degeneration, right eye: Secondary | ICD-10-CM | POA: Insufficient documentation

## 2020-01-09 NOTE — Assessment & Plan Note (Signed)

## 2020-01-09 NOTE — Patient Instructions (Signed)
Patient instructed to contact the office promptly new onset visual acuity declines or distortions

## 2020-01-09 NOTE — Progress Notes (Signed)
01/09/2020     CHIEF COMPLAINT Patient presents for Retina Follow Up   HISTORY OF PRESENT ILLNESS: Kimberly Merritt is a 79 y.o. female who presents to the clinic today for:   HPI    Retina Follow Up    Patient presents with  Dry AMD.  In both eyes.  Severity is moderate.  Duration of 1 year.  Since onset it is stable.  I, the attending physician,  performed the HPI with the patient and updated documentation appropriately.          Comments    1 Year Dry AMD f\u OU. OCT  Pt states she has not noticed any changes in vision. Pt recently saw Dr. Gershon Crane in Oct.       Last edited by Tilda Franco on 01/09/2020 10:36 AM. (History)      Referring physician: Lorrene Reid, PA-C [address],  [place] [id number]  HISTORICAL INFORMATION:   Selected notes from the MEDICAL RECORD NUMBER    Lab Results  Component Value Date   HGBA1C 5.6 10/23/2018     CURRENT MEDICATIONS: Current Outpatient Medications (Ophthalmic Drugs)  Medication Sig  . Polyethyl Glycol-Propyl Glycol (SYSTANE ULTRA) 0.4-0.3 % SOLN Apply to eye. 2 drops in each eye 3 to 4 times daily   No current facility-administered medications for this visit. (Ophthalmic Drugs)   Current Outpatient Medications (Other)  Medication Sig  . carvedilol (COREG) 12.5 MG tablet TAKE 1 TABLET BY MOUTH 2 TIMES DAILY.  Marland Kitchen Cholecalciferol (VITAMIN D3) 50 MCG (2000 UT) capsule Take 1 capsule by mouth daily.   . cromolyn (NASALCROM) 5.2 MG/ACT nasal spray Place 1 spray into both nostrils 4 (four) times daily.  . fexofenadine (ALLEGRA) 180 MG tablet Take 180 mg by mouth daily.  . Glucosamine-Chondroitin 1500-1200 MG/30ML LIQD Take 1 tablet by mouth daily.   Marland Kitchen losartan (COZAAR) 25 MG tablet TAKE 1 TABLET BY MOUTH DAILY.  . pantoprazole (PROTONIX) 40 MG tablet Take 40 mg by mouth daily.  Marland Kitchen SALINE NASAL SPRAY NA Place into the nose. 2 sprays each nostril every 4 hours  . sucralfate (CARAFATE) 1 g tablet Take 1  g by mouth daily as needed.   Marland Kitchen SYNTHROID 25 MCG tablet TAKE 1 TABLET BY MOUTH DAILY BEFORE BREAKFAST.**NEEDS APT FOR FURTHER REFILLS**   No current facility-administered medications for this visit. (Other)      REVIEW OF SYSTEMS:    ALLERGIES Allergies  Allergen Reactions  . Acetaminophen Rash    REACTION: high dose - tremor Per pt - rash on chest and heart palpitations  . Alendronate Sodium     Stomach upset.   . Codeine     May interfere with heart condition    . Contrast Media [Iodinated Diagnostic Agents]     Syncope.   . Diflunisal     Stomach pain.    . Doxycycline     n/v  . Flexeril [Cyclobenzaprine]     Abdominal pain.    . Nabumetone     Abdominal pain; dark, tarry stool.    . Oxybutynin     Pt does not remember.    . Raloxifene     Unknown.    . Sulfa Antibiotics Nausea Only    Syncope.    . Symbicort [Budesonide-Formoterol Fumarate]     "Jittery-ness."   . Tramadol Hcl     Numbness.    . Tramadol Hcl Itching    Numbness.    Marland Kitchen  Diclofenac Palpitations    Dark, tarry stool.    . Latex Rash    PAST MEDICAL HISTORY Past Medical History:  Diagnosis Date  . Allergy   . CAD (coronary artery disease)    Nonobstructive. LAD 30% stenosis, small PDA of the circumflex 70% stenosis, right coronary artery 40% stenosis. Catheterization September 2015  . DEPRESSION 08/29/2006  . Diverticulosis   . GERD 08/29/2006  . HEPATIC CYST 08/02/2007  . Hypertension   . HYPOTHYROIDISM 08/29/2006  . LIVER HEMANGIOMA 03/03/2010  . LOW BACK PAIN 08/29/2006  . 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 MUSCLE SPASM 03/03/2010  . Nonischemic cardiomyopathy (HCC)    EF 30%  . OSTEOARTHRITIS 08/29/2006  . OSTEOPENIA 11/19/2008  . PARESTHESIA 03/05/2009  . PVC's (premature ventricular contractions)   . Scoliosis   . 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 ABDOMINAL HYSTERECTOMY  1981  . APPENDECTOMY    . BACK SURGERY   2010,2006   lumb fusion  . BUNIONECTOMY     both  . BUNIONECTOMY Right 1985  . CARPAL TUNNEL RELEASE     both  . CARPAL TUNNEL RELEASE Right 1992  . CARPAL TUNNEL RELEASE Left 1992  . CATARACT EXTRACTION     both  . CATARACT EXTRACTION Right 2008  . CATARACT EXTRACTION Left 2008  . HAMMER TOE SURGERY Left 1987  . HAMMER TOE SURGERY Right 1986  . LEFT AND RIGHT HEART CATHETERIZATION WITH CORONARY ANGIOGRAM N/A 10/04/2013   Procedure: LEFT AND RIGHT HEART CATHETERIZATION WITH CORONARY ANGIOGRAM;  Surgeon: Leonie Man, MD;  Location: [facility] CATH LAB;  Service: Cardiovascular;  Laterality: N/A;  . LUMBAR FUSION  2006  . LUMBAR FUSION  2010  . OOPHORECTOMY  1967  . OVARIAN CYST REMOVAL    . ROTATOR CUFF REPAIR Right 2013  . SHOULDER ARTHROSCOPY  02/09/2011   Procedure: ARTHROSCOPY SHOULDER;  Surgeon: Hessie Dibble, MD;  Location: [facility];  Service: Orthopedics;  Laterality: Right;  right shoulder arthroscopy subacromial decompression with rotator cuff repair  . TONSILLECTOMY      FAMILY HISTORY Family History  Problem Relation Age of Onset  . Stroke Mother   . Cancer Father   . Emphysema Father   . Hypertension Other   . Breast cancer Neg Hx     SOCIAL HISTORY Social History   Tobacco Use  . Smoking status: Never Smoker  . Smokeless tobacco: Never Used  Vaping Use  . Vaping Use: Never used  Substance Use Topics  . Alcohol use: No    Alcohol/week: 0.0 standard drinks  . Drug use: No         OPHTHALMIC EXAM:  Base Eye Exam    Visual Acuity (Snellen - Linear)      Right Left   Dist [place] 20/25 + 20/60 -1   Dist ph [place]  20/40       Tonometry (Tonopen, 10:45 AM)      Right Left   Pressure 9 12       Pupils      Pupils Dark Light Shape React APD   Right PERRL 3 2 Round Brisk None   Left PERRL 3 2 Round Brisk None       Visual Fields (Counting fingers)      Left Right    Full Full       Neuro/Psych    Oriented x3: Yes   Mood/Affect:  Normal  Dilation    Both eyes: 1.0% Mydriacyl, 2.5% Phenylephrine @ 10:45 AM        Slit Lamp and Fundus Exam    External Exam      Right Left   External Normal Normal       Slit Lamp Exam      Right Left   Lids/Lashes Normal Normal   Conjunctiva/Sclera White and quiet White and quiet   Cornea Clear Clear   Anterior Chamber Deep and quiet Deep and quiet   Iris Round and reactive Round and reactive   Lens Centered posterior chamber intraocular lens Centered posterior chamber intraocular lens   Anterior Vitreous Normal Normal       Fundus Exam      Right Left   Posterior Vitreous Posterior vitreous detachment Posterior vitreous detachment   Disc Normal Normal   C/D Ratio 0.2 0.2   Macula Early age related macular degeneration, Hard drusen Soft drusen, Early age related macular degeneration   Vessels Normal Normal   Periphery Normal Normal          IMAGING AND PROCEDURES  Imaging and Procedures for 01/09/20  OCT, Retina - OU - Both Eyes       Right Eye Quality was good. Scan locations included subfoveal. Central Foveal Thickness: 277. Progression has been stable. Findings include abnormal foveal contour, no SRF.   Left Eye Quality was good. Scan locations included subfoveal. Central Foveal Thickness: 273. Progression has been stable. Findings include abnormal foveal contour, retinal drusen , no IRF, no SRF.                 ASSESSMENT/PLAN:  Intermediate stage nonexudative age-related macular degeneration of both eyes The nature of age--related macular degeneration was discussed with the patient as well as the distinction between dry and wet types. Checking an Amsler Grid daily with advice to return immediately should a distortion develop, was given to the patient. The patient 's smoking status now and in the past was determined and advice based on the AREDS study was provided regarding the consumption of antioxidant supplements. AREDS 2 vitamin  formulation was recommended. Consumption of dark leafy vegetables and fresh fruits of various colors was recommended. Treatment modalities for wet macular degeneration particularly the use of intravitreal injections of anti-blood vessel growth factors was discussed with the patient. Avastin, Lucentis, and Eylea are the available options. On occasion, therapy includes the use of photodynamic therapy and thermal laser. Stressed to the patient do not rub eyes.  Patient was advised to check Amsler Grid daily and return immediately if changes are noted. Instructions on using the grid were given to the patient. All patient questions were answered.  Posterior vitreous detachment of right eye   The nature of posterior vitreous detachment was discussed with the patient as well as its physiology, its age prevalence, and its possible implication regarding retinal breaks and detachment.  An informational brochure was given to the patient.  All the patient's questions were answered.  The patient was asked to return if new or different flashes or floaters develops.   Patient was instructed to contact office immediately if any changes were noticed. I explained to the patient that vitreous inside the eye is similar to jello inside a bowl. As the jello melts it can start to pull away from the bowl, similarly the vitreous throughout our lives can begin to pull away from the retina. That process is called a posterior vitreous detachment. In some cases, the vitreous can  tug hard enough on the retina to form a retinal tear. I discussed with the patient the signs and symptoms of a retinal detachment.  Do not rub the eye.      ICD-10-CM   1. Intermediate stage nonexudative age-related macular degeneration of both eyes  H35.3132 OCT, Retina - OU - Both Eyes  2. Posterior vitreous detachment of right eye  H43.811   3. Degenerative retinal drusen, both eyes  H35.363   4. Vitreous hemorrhage of left eye (HCC)  H43.12      1.  2.  3.  Ophthalmic Meds Ordered this visit:  No orders of the defined types were placed in this encounter.      Return in about 1 year (around 01/08/2021) for DILATE OU, OCT.  Patient Instructions  Patient instructed to contact the office promptly new onset visual acuity declines or distortions    Explained the diagnoses, plan, and follow up with the patient and they expressed understanding.  Patient expressed understanding of the importance of proper follow up care.   Clent Demark Rowen Hur M.D. Diseases & Surgery of the Retina and Vitreous [facility] [place] 01/09/20     Abbreviations: M myopia (nearsighted); A astigmatism; H hyperopia (farsighted); P presbyopia; Mrx spectacle prescription;  CTL contact lenses; OD right eye; OS left eye; OU both eyes  XT exotropia; ET esotropia; PEK punctate epithelial keratitis; PEE punctate epithelial erosions; DES dry eye syndrome; MGD meibomian gland dysfunction; ATs artificial tears; PFAT's preservative free artificial tears; Gandy nuclear sclerotic cataract; PSC posterior subcapsular cataract; ERM epi-retinal membrane; PVD posterior vitreous detachment; RD retinal detachment; DM diabetes mellitus; DR diabetic retinopathy; NPDR non-proliferative diabetic retinopathy; PDR proliferative diabetic retinopathy; CSME clinically significant macular edema; DME diabetic macular edema; dbh dot blot hemorrhages; CWS cotton wool spot; POAG primary open angle glaucoma; C/D cup-to-disc ratio; HVF humphrey visual field; GVF goldmann visual field; OCT optical coherence tomography; IOP intraocular pressure; BRVO Branch retinal vein occlusion; CRVO central retinal vein occlusion; CRAO central retinal artery occlusion; BRAO branch retinal artery occlusion; RT retinal tear; SB scleral buckle; PPV pars plana vitrectomy; VH Vitreous hemorrhage; PRP panretinal laser photocoagulation; IVK intravitreal kenalog; VMT vitreomacular traction; MH Macular hole;  NVD  neovascularization of the disc; NVE neovascularization elsewhere; AREDS age related eye disease study; ARMD age related macular degeneration; POAG primary open angle glaucoma; EBMD epithelial/anterior basement membrane dystrophy; ACIOL anterior chamber intraocular lens; IOL intraocular lens; PCIOL posterior chamber intraocular lens; Phaco/IOL phacoemulsification with intraocular lens placement; Garber photorefractive keratectomy; LASIK laser assisted in situ keratomileusis; HTN hypertension; DM diabetes mellitus; COPD chronic obstructive pulmonary disease

## 2020-01-09 NOTE — Assessment & Plan Note (Signed)

## 2020-01-16 ENCOUNTER — Encounter: Payer: Self-pay | Admitting: Physician Assistant

## 2020-01-16 ENCOUNTER — Other Ambulatory Visit: Payer: Self-pay

## 2020-01-16 ENCOUNTER — Ambulatory Visit (INDEPENDENT_AMBULATORY_CARE_PROVIDER_SITE_OTHER): Payer: Medicare Other | Admitting: Physician Assistant

## 2020-01-16 VITALS — BP 132/68 | HR 73 | Ht 62.0 in | Wt 135.4 lb

## 2020-01-16 DIAGNOSIS — E039 Hypothyroidism, unspecified: Secondary | ICD-10-CM

## 2020-01-16 DIAGNOSIS — I208 Other forms of angina pectoris: Secondary | ICD-10-CM | POA: Insufficient documentation

## 2020-01-16 DIAGNOSIS — G9332 Myalgic encephalomyelitis/chronic fatigue syndrome: Secondary | ICD-10-CM | POA: Insufficient documentation

## 2020-01-16 DIAGNOSIS — R0600 Dyspnea, unspecified: Secondary | ICD-10-CM

## 2020-01-16 DIAGNOSIS — M542 Cervicalgia: Secondary | ICD-10-CM | POA: Diagnosis not present

## 2020-01-16 DIAGNOSIS — R0609 Other forms of dyspnea: Secondary | ICD-10-CM

## 2020-01-16 DIAGNOSIS — I10 Essential (primary) hypertension: Secondary | ICD-10-CM | POA: Diagnosis not present

## 2020-01-16 DIAGNOSIS — R5382 Chronic fatigue, unspecified: Secondary | ICD-10-CM | POA: Insufficient documentation

## 2020-01-16 DIAGNOSIS — K449 Diaphragmatic hernia without obstruction or gangrene: Secondary | ICD-10-CM | POA: Insufficient documentation

## 2020-01-16 NOTE — Progress Notes (Signed)
Established Patient Office Visit  Subjective:  Patient ID: Kimberly Merritt, female    DOB: [birth year]  Age: 79 y.o. MRN: [id number]  CC:  Chief Complaint  Patient presents with  . Thyroid Problem    HPI Kimberly Merritt presents for follow up on hypothyroid and hypertension. Patient reports is seeing Orthopedics (Dr. Rhona Raider) for left shoulder rotator cuff problem. Also had a cervical xray which revealed significant DDD and reports has been experiencing some numbness of arms/hands. Has seen neurosurgery several years ago.  Hypothyroidism: Denies fatigue, heat/cold intolerance or weight changes.  Has been on the same dose of levothyroxine for quite some time.  HTN: Pt denies new onset chest pain, palpitations, dizziness or increased lower extremity swelling. States has been experiencing shortness of breath with exertion which started after her Covid booster vaccine several weeks ago. Denies chest pain with activity or at rest. No weight changes. Taking medication as directed without side effects. Checks BP at home. Reports her blood pressure reading today in the morning was 137/59 and rechecked it later and was 111/54.  Has been trying to stay hydrated.   Past Medical History:  Diagnosis Date  . Allergy   . CAD (coronary artery disease)    Nonobstructive. LAD 30% stenosis, small PDA of the circumflex 70% stenosis, right coronary artery 40% stenosis. Catheterization September 2015  . DEPRESSION 08/29/2006  . Diverticulosis   . GERD 08/29/2006  . HEPATIC CYST 08/02/2007  . Hypertension   . HYPOTHYROIDISM 08/29/2006  . LIVER HEMANGIOMA 03/03/2010  . LOW BACK PAIN 08/29/2006  . 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 MUSCLE SPASM 03/03/2010  . Nonischemic cardiomyopathy (HCC)    EF 30%  . OSTEOARTHRITIS 08/29/2006  . OSTEOPENIA 11/19/2008  . PARESTHESIA 03/05/2009  . PVC's (premature ventricular contractions)   . Scoliosis   . VARICOSE VEINS, LOWER EXTREMITIES 08/29/2006    Past Surgical  History:  Procedure Laterality Date  . ABDOMINAL HYSTERECTOMY  1981  . APPENDECTOMY    . BACK SURGERY  2010,2006   lumb fusion  . BUNIONECTOMY     both  . BUNIONECTOMY Right 1985  . CARPAL TUNNEL RELEASE     both  . CARPAL TUNNEL RELEASE Right 1992  . CARPAL TUNNEL RELEASE Left 1992  . CATARACT EXTRACTION     both  . CATARACT EXTRACTION Right 2008  . CATARACT EXTRACTION Left 2008  . HAMMER TOE SURGERY Left 1987  . HAMMER TOE SURGERY Right 1986  . LEFT AND RIGHT HEART CATHETERIZATION WITH CORONARY ANGIOGRAM N/A 10/04/2013   Procedure: LEFT AND RIGHT HEART CATHETERIZATION WITH CORONARY ANGIOGRAM;  Surgeon: Leonie Man, MD;  Location: [facility] CATH LAB;  Service: Cardiovascular;  Laterality: N/A;  . LUMBAR FUSION  2006  . LUMBAR FUSION  2010  . OOPHORECTOMY  1967  . OVARIAN CYST REMOVAL    . ROTATOR CUFF REPAIR Right 2013  . SHOULDER ARTHROSCOPY  02/09/2011   Procedure: ARTHROSCOPY SHOULDER;  Surgeon: Hessie Dibble, MD;  Location: [facility];  Service: Orthopedics;  Laterality: Right;  right shoulder arthroscopy subacromial decompression with rotator cuff repair  . TONSILLECTOMY      Family History  Problem Relation Age of Onset  . Stroke Mother   . Cancer Father   . Emphysema Father   . Hypertension Other   . Breast cancer Neg Hx     Social History   Socioeconomic History  . Marital status: Widowed    Spouse name: Not on file  .  Number of children: Not on file  . Years of education: Not on file  . Highest education level: Not on file  Occupational History  . Occupation: Retired  Tobacco Use  . Smoking status: Never Smoker  . Smokeless tobacco: Never Used  Vaping Use  . Vaping Use: Never used  Substance and Sexual Activity  . Alcohol use: No    Alcohol/week: 0.0 standard drinks  . Drug use: No  . Sexual activity: Not Currently  Other Topics Concern  . Not on file  Social History Narrative   Lives alone.     Social Determinants of Health    Financial Resource Strain: Not on file  Food Insecurity: Not on file  Transportation Needs: Not on file  Physical Activity: Not on file  Stress: Not on file  Social Connections: Not on file  Intimate Partner Violence: Not on file    Outpatient Medications Prior to Visit  Medication Sig Dispense Refill  . carvedilol (COREG) 12.5 MG tablet TAKE 1 TABLET BY MOUTH 2 TIMES DAILY. 180 tablet 3  . Cholecalciferol (VITAMIN D3) 50 MCG (2000 UT) capsule Take 1 capsule by mouth daily.     . cromolyn (NASALCROM) 5.2 MG/ACT nasal spray Place 1 spray into both nostrils 4 (four) times daily.    . fexofenadine (ALLEGRA) 180 MG tablet Take 180 mg by mouth daily.    . Glucosamine-Chondroitin 1500-1200 MG/30ML LIQD Take 1 tablet by mouth daily.     Marland Kitchen losartan (COZAAR) 25 MG tablet TAKE 1 TABLET BY MOUTH DAILY. 90 tablet 2  . pantoprazole (PROTONIX) 40 MG tablet Take 40 mg by mouth daily.    Vladimir Faster Glycol-Propyl Glycol (SYSTANE ULTRA) 0.4-0.3 % SOLN Apply to eye. 2 drops in each eye 3 to 4 times daily    . SALINE NASAL SPRAY NA Place into the nose. 2 sprays each nostril every 4 hours    . sucralfate (CARAFATE) 1 g tablet Take 1 g by mouth daily as needed.     Marland Kitchen SYNTHROID 25 MCG tablet TAKE 1 TABLET BY MOUTH DAILY BEFORE BREAKFAST.**NEEDS APT FOR FURTHER REFILLS** 90 tablet 0   No facility-administered medications prior to visit.    Allergies  Allergen Reactions  . Acetaminophen Rash    REACTION: high dose - tremor Per pt - rash on chest and heart palpitations  . Alendronate Sodium     Stomach upset.   . Codeine     May interfere with heart condition    . Diflunisal     Stomach pain.    . Doxycycline     n/v  . Flexeril [Cyclobenzaprine]     Abdominal pain.    . Iodinated Diagnostic Agents Other (See Comments)    Syncope.   . Lisinopril Other (See Comments)  . Nabumetone     Abdominal pain; dark, tarry stool.    . Other Other (See Comments)  . Oxybutynin     Pt does not remember.     . Raloxifene     Unknown.    . Sulfa Antibiotics Nausea Only    Syncope.    . Symbicort [Budesonide-Formoterol Fumarate]     "Jittery-ness."   . Tramadol Hcl     Numbness.    . Tramadol Hcl Itching    Numbness.    . Diclofenac Palpitations    Dark, tarry stool.    . Latex Rash    ROS Review of Systems A fourteen system review of systems was performed and found to be  positive as per HPI.  Objective:    Physical Exam General:  Well Developed, well nourished, appropriate for stated age.  Neuro:  Alert and oriented,  extra-ocular muscles intact  HEENT:  Normocephalic, atraumatic, neck supple Skin:  no gross rash, warm, pink. Cardiac:  RRR, S1 S2 Respiratory:  ECTA B/L, Not using accessory muscles, speaking in full sentences- unlabored. Vascular:  Ext warm, no cyanosis apprec.; Trace edema (wearing compression socks) Psych:  No HI/SI, judgement and insight good, Euthymic mood. Full Affect.  BP 132/68   Pulse 73   Ht '5\' 2"'  (1.575 m)   Wt 135 lb 6.4 oz (61.4 kg)   SpO2 98%   BMI 24.76 kg/m  Wt Readings from Last 3 Encounters:  01/16/20 135 lb 6.4 oz (61.4 kg)  12/04/19 131 lb 8 oz (59.6 kg)  11/14/19 131 lb (59.4 kg)     Health Maintenance Due  Topic Date Due  . Hepatitis C Screening  Never done  . COVID-19 Vaccine (3 - Booster for Pfizer series) 10/04/2019    There are no preventive care reminders to display for this patient.  Lab Results  Component Value Date   TSH 1.660 10/23/2018   Lab Results  Component Value Date   WBC 8.9 12/04/2019   HGB 12.6 12/04/2019   HCT 38.2 12/04/2019   MCV 93.4 12/04/2019   PLT 305 12/04/2019   Lab Results  Component Value Date   NA 135 12/04/2019   K 4.3 12/04/2019   CHLORIDE 102 11/04/2016   CO2 26 12/04/2019   GLUCOSE 96 12/04/2019   BUN 17 12/04/2019   CREATININE 0.79 12/04/2019   BILITOT 0.4 12/04/2019   ALKPHOS 77 12/04/2019   AST 21 12/04/2019   ALT 17 12/04/2019   PROT 7.2 12/04/2019   ALBUMIN 3.7  12/04/2019   CALCIUM 9.1 12/04/2019   ANIONGAP 9 12/04/2019   EGFR 69 (L) 11/04/2016   GFR 72.58 08/21/2013   Lab Results  Component Value Date   CHOL 183 10/23/2018   Lab Results  Component Value Date   HDL 74 10/23/2018   Lab Results  Component Value Date   LDLCALC 99 10/23/2018   Lab Results  Component Value Date   TRIG 52 10/23/2018   Lab Results  Component Value Date   CHOLHDL 2.5 10/23/2018   Lab Results  Component Value Date   HGBA1C 5.6 10/23/2018      Assessment & Plan:   Problem List Items Addressed This Visit      Cardiovascular and Mediastinum   Hypertension - Primary (Chronic)     Endocrine   Hypothyroidism (acquired) (Chronic)    Other Visit Diagnoses    Neck pain       Shortness of breath after COVID-19 vaccination         HTN: - BP stable. - Continue current medication regimen. - Continue ambulatory BP and pulse monitoring. - Recommend to stay well hydrated. - Advised to schedule lab visit for fasting blood work including CMP for medication monitoring. - Will continue to monitor alongside cardiology.  Hypothyroidism: - Last TSH 1.660, wnl - Advised to schedule lab visit for fasting blood work including TSH. Pending results, will send additional refills of Levothyroxine or make medication adjustments if indicated.  Neck pain: -Patient plans to discuss with Dr. Rhona Raider (Orthopedic) neck pain and possible referral to neurosurgery for further evaluation and treatment. Advised patient to let me know if referral needs to be place to location of her choice. Patient verbalized  understanding.  -Imaging studies not available in epic for review. Cervical DDD likely contributing to radiculopathy symptoms.  Dyspnea with exertion: -Discussed with patient possibly side effect from vaccine. On exam no significant volume overload, adventitious sounds or arrhyhtmia noted. If symptoms fail to improve or worsen recommend further evaluation with chest xray  or follow up with cardiology sooner. -Echocardiogram 08/14/2019: LVEF 60-65%, wnl -Advised to schedule lab visit to evaluate thyroid, electrolytes, renal and hepatic function.  No orders of the defined types were placed in this encounter.   Follow-up: Return in about 6 months (around 07/16/2020) for MCW; FBW in1-2 weeks.   Note:  This note was prepared with assistance of Dragon voice recognition software. Occasional wrong-word or sound-a-like substitutions may have occurred due to the inherent limitations of voice recognition software.  Lorrene Reid, PA-C

## 2020-01-16 NOTE — Patient Instructions (Addendum)
Cervical Radiculopathy  Cervical radiculopathy means that a nerve in the neck (a cervical nerve) is pinched or bruised. This can happen because of an injury to the cervical spine (vertebrae) in the neck, or as a normal part of getting older. This can cause pain or loss of feeling (numbness) that runs from your neck all the way down to your arm and fingers. Often, this condition gets better with rest. Treatment may be needed if the condition does not get better. What are the causes?  A neck injury.  A bulging disk in your spine.  Muscle movements that you cannot control (muscle spasms).  Tight muscles in your neck due to overuse.  Arthritis.  Breakdown in the bones and joints of the spine (spondylosis) due to getting older.  Bone spurs that form near the nerves in the neck. What are the signs or symptoms?  Pain. The pain may: ? Run from the neck to the arm and hand. ? Be very bad or irritating. ? Be worse when you move your neck.  Loss of feeling or tingling in your arm or hand.  Weakness in your arm or hand, in very bad cases. How is this treated? In many cases, treatment is not needed for this condition. With rest, the condition often gets better over time. If treatment is needed, options may include:  Wearing a soft neck collar (cervical collar) for short periods of time, as told by your doctor.  Doing exercises (physical therapy) to strengthen your neck muscles.  Taking medicines.  Having shots (injections) in your spine, in very bad cases.  Having surgery. This may be needed if other treatments do not help. The type of surgery that is used depends on the cause of your condition. Follow these instructions at home: If you have a soft neck collar:  Wear it as told by your doctor. Remove it only as told by your doctor.  Ask your doctor if you can remove the collar for cleaning and bathing. If you are allowed to remove the collar for cleaning or bathing: ? Follow  instructions from your doctor about how to remove the collar safely. ? Clean the collar by wiping it with mild soap and water and drying it completely. ? Take out any removable pads in the collar every 1-2 days. Wash them by hand with soap and water. Let them air-dry completely before you put them back in the collar. ? Check your skin under the collar for redness or sores. If you see any, tell your doctor. Managing pain      Take over-the-counter and prescription medicines only as told by your doctor.  If told, put ice on the painful area. ? If you have a soft neck collar, remove it as told by your doctor. ? Put ice in a plastic bag. ? Place a towel between your skin and the bag. ? Leave the ice on for 20 minutes, 2-3 times a day.  If using ice does not help, you can try using heat. Use the heat source that your doctor recommends, such as a moist heat pack or a heating pad. ? Place a towel between your skin and the heat source. ? Leave the heat on for 20-30 minutes. ? Remove the heat if your skin turns bright red. This is very important if you are unable to feel pain, heat, or cold. You may have a greater risk of getting burned.  You may try a gentle neck and shoulder rub (massage). Activity  Rest as needed.  Return to your normal activities as told by your doctor. Ask your doctor what activities are safe for you.  Do exercises as told by your doctor or physical therapist.  Do not lift anything that is heavier than 10 lb (4.5 kg) until your doctor tells you that it is safe. General instructions  Use a flat pillow when you sleep.  Do not drive while wearing a soft neck collar. If you do not have a soft neck collar, ask your doctor if it is safe to drive while your neck heals.  Ask your doctor if the medicine prescribed to you requires you to avoid driving or using heavy machinery.  Do not use any products that contain nicotine or tobacco, such as cigarettes, e-cigarettes, and  chewing tobacco. These can delay healing. If you need help quitting, ask your doctor.  Keep all follow-up visits as told by your doctor. This is important. Contact a doctor if:  Your condition does not get better with treatment. Get help right away if:  Your pain gets worse and is not helped with medicine.  You lose feeling or feel weak in your hand, arm, face, or leg.  You have a high fever.  You have a stiff neck.  You cannot control when you poop or pee (have incontinence).  You have trouble with walking, balance, or talking. Summary  Cervical radiculopathy means that a nerve in the neck is pinched or bruised.  A nerve can get pinched from a bulging disk, arthritis, an injury to the neck, or other causes.  Symptoms include pain, tingling, or loss of feeling that goes from the neck into the arm or hand.  Weakness in your arm or hand can happen in very bad cases.  Treatment may include resting, wearing a soft neck collar, and doing exercises. You might need to take medicines for pain. In very bad cases, shots or surgery may be needed. This information is not intended to replace advice given to you by your health care provider. Make sure you discuss any questions you have with your health care provider. Document Revised: 12/09/2017 Document Reviewed: 12/09/2017 Elsevier Patient Education  [place] DASH stands for "Dietary Approaches to Stop Hypertension." The DASH eating plan is a healthy eating plan that has been shown to reduce high blood pressure (hypertension). It may also reduce your risk for type 2 diabetes, heart disease, and stroke. The DASH eating plan may also help with weight loss. What are tips for following this plan?  General guidelines  Avoid eating more than 2,300 mg (milligrams) of salt (sodium) a day. If you have hypertension, you may need to reduce your sodium intake to 1,500 mg a day.  Limit alcohol intake to no more than  1 drink a day for nonpregnant women and 2 drinks a day for men. One drink equals 12 oz of beer, 5 oz of wine, or 1 oz of hard liquor.  Work with your health care provider to maintain a healthy body weight or to lose weight. Ask what an ideal weight is for you.  Get at least 30 minutes of exercise that causes your heart to beat faster (aerobic exercise) most days of the week. Activities may include walking, swimming, or biking.  Work with your health care provider or diet and nutrition specialist (dietitian) to adjust your eating plan to your individual calorie needs. Reading food labels   Check food labels for the amount  of sodium per serving. Choose foods with less than 5 percent of the Daily Value of sodium. Generally, foods with less than 300 mg of sodium per serving fit into this eating plan.  To find whole grains, look for the word "whole" as the first word in the ingredient list. Shopping  Buy products labeled as "low-sodium" or "no salt added."  Buy fresh foods. Avoid canned foods and premade or frozen meals. Cooking  Avoid adding salt when cooking. Use salt-free seasonings or herbs instead of table salt or sea salt. Check with your health care provider or pharmacist before using salt substitutes.  Do not fry foods. Cook foods using healthy methods such as baking, boiling, grilling, and broiling instead.  Cook with heart-healthy oils, such as olive, canola, soybean, or sunflower oil. Meal planning  Eat a balanced diet that includes: ? 5 or more servings of fruits and vegetables each day. At each meal, try to fill half of your plate with fruits and vegetables. ? Up to 6-8 servings of whole grains each day. ? Less than 6 oz of lean meat, poultry, or fish each day. A 3-oz serving of meat is about the same size as a deck of cards. One egg equals 1 oz. ? 2 servings of low-fat dairy each day. ? A serving of nuts, seeds, or beans 5 times each week. ? Heart-healthy fats. Healthy fats  called Omega-3 fatty acids are found in foods such as flaxseeds and coldwater fish, like sardines, salmon, and mackerel.  Limit how much you eat of the following: ? Canned or prepackaged foods. ? Food that is high in trans fat, such as fried foods. ? Food that is high in saturated fat, such as fatty meat. ? Sweets, desserts, sugary drinks, and other foods with added sugar. ? Full-fat dairy products.  Do not salt foods before eating.  Try to eat at least 2 vegetarian meals each week.  Eat more home-cooked food and less restaurant, buffet, and fast food.  When eating at a restaurant, ask that your food be prepared with less salt or no salt, if possible. What foods are recommended? The items listed may not be a complete list. Talk with your dietitian about what dietary choices are best for you. Grains Whole-grain or whole-wheat bread. Whole-grain or whole-wheat pasta. Brown rice. [place]. Bulgur. Whole-grain and low-sodium cereals. Pita bread. Low-fat, low-sodium crackers. Whole-wheat flour tortillas. Vegetables Fresh or frozen vegetables (raw, steamed, roasted, or grilled). Low-sodium or reduced-sodium tomato and vegetable juice. Low-sodium or reduced-sodium tomato sauce and tomato paste. Low-sodium or reduced-sodium canned vegetables. Fruits All fresh, dried, or frozen fruit. Canned fruit in natural juice (without added sugar). Meat and other protein foods Skinless chicken or [place]. Ground chicken or [place]. Pork with fat trimmed off. Fish and seafood. Egg whites. Dried beans, peas, or lentils. Unsalted nuts, nut butters, and seeds. Unsalted canned beans. Lean cuts of beef with fat trimmed off. Low-sodium, lean deli meat. Dairy Low-fat (1%) or fat-free (skim) milk. Fat-free, low-fat, or reduced-fat cheeses. Nonfat, low-sodium ricotta or cottage cheese. Low-fat or nonfat yogurt. Low-fat, low-sodium cheese. Fats and oils Soft margarine without trans fats. Vegetable oil. Low-fat,  reduced-fat, or light mayonnaise and salad dressings (reduced-sodium). Canola, safflower, olive, soybean, and sunflower oils. Avocado. Seasoning and other foods Herbs. Spices. Seasoning mixes without salt. Unsalted popcorn and pretzels. Fat-free sweets. What foods are not recommended? The items listed may not be a complete list. Talk with your dietitian about what dietary choices are best for  you. Grains Baked goods made with fat, such as croissants, muffins, or some breads. Dry pasta or rice meal packs. Vegetables Creamed or fried vegetables. Vegetables in a cheese sauce. Regular canned vegetables (not low-sodium or reduced-sodium). Regular canned tomato sauce and paste (not low-sodium or reduced-sodium). Regular tomato and vegetable juice (not low-sodium or reduced-sodium). Angie Fava. Olives. Fruits Canned fruit in a light or heavy syrup. Fried fruit. Fruit in cream or butter sauce. Meat and other protein foods Fatty cuts of meat. Ribs. Fried meat. Berniece Salines. Sausage. Bologna and other processed lunch meats. Salami. Fatback. Hotdogs. Bratwurst. Salted nuts and seeds. Canned beans with added salt. Canned or smoked fish. Whole eggs or egg yolks. Chicken or [place] with skin. Dairy Whole or 2% milk, cream, and half-and-half. Whole or full-fat cream cheese. Whole-fat or sweetened yogurt. Full-fat cheese. Nondairy creamers. Whipped toppings. Processed cheese and cheese spreads. Fats and oils Butter. Stick margarine. Lard. Shortening. Ghee. Bacon fat. Tropical oils, such as coconut, palm kernel, or palm oil. Seasoning and other foods Salted popcorn and pretzels. Onion salt, garlic salt, seasoned salt, table salt, and sea salt. Worcestershire sauce. Tartar sauce. Barbecue sauce. Teriyaki sauce. Soy sauce, including reduced-sodium. Steak sauce. Canned and packaged gravies. Fish sauce. Oyster sauce. Cocktail sauce. Horseradish that you find on the shelf. Ketchup. Mustard. Meat flavorings and tenderizers.  Bouillon cubes. Hot sauce and Tabasco sauce. Premade or packaged marinades. Premade or packaged taco seasonings. Relishes. Regular salad dressings. Where to find more information:  National Heart, Lung, and [place]: https://wilson-eaton.com/  American Heart Association: www.heart.org Summary  The DASH eating plan is a healthy eating plan that has been shown to reduce high blood pressure (hypertension). It may also reduce your risk for type 2 diabetes, heart disease, and stroke.  With the DASH eating plan, you should limit salt (sodium) intake to 2,300 mg a day. If you have hypertension, you may need to reduce your sodium intake to 1,500 mg a day.  When on the DASH eating plan, aim to eat more fresh fruits and vegetables, whole grains, lean proteins, low-fat dairy, and heart-healthy fats.  Work with your health care provider or diet and nutrition specialist (dietitian) to adjust your eating plan to your individual calorie needs. This information is not intended to replace advice given to you by your health care provider. Make sure you discuss any questions you have with your health care provider. Document Revised: 12/31/2016 Document Reviewed: 01/12/2016 Elsevier Patient Education  2020 [employer].

## 2020-01-18 ENCOUNTER — Other Ambulatory Visit: Payer: Self-pay | Admitting: Physician Assistant

## 2020-01-18 DIAGNOSIS — E039 Hypothyroidism, unspecified: Secondary | ICD-10-CM

## 2020-01-22 DIAGNOSIS — M47812 Spondylosis without myelopathy or radiculopathy, cervical region: Secondary | ICD-10-CM | POA: Diagnosis not present

## 2020-01-23 ENCOUNTER — Other Ambulatory Visit: Payer: Self-pay

## 2020-01-23 ENCOUNTER — Other Ambulatory Visit: Payer: Medicare Other

## 2020-01-23 DIAGNOSIS — E785 Hyperlipidemia, unspecified: Secondary | ICD-10-CM

## 2020-01-23 DIAGNOSIS — Z8639 Personal history of other endocrine, nutritional and metabolic disease: Secondary | ICD-10-CM | POA: Diagnosis not present

## 2020-01-23 DIAGNOSIS — E039 Hypothyroidism, unspecified: Secondary | ICD-10-CM

## 2020-01-23 DIAGNOSIS — I10 Essential (primary) hypertension: Secondary | ICD-10-CM

## 2020-01-23 DIAGNOSIS — Z0000 Encounter for general adult medical examination without abnormal findings: Secondary | ICD-10-CM

## 2020-01-24 LAB — CBC WITH DIFFERENTIAL/PLATELET
Basophils Absolute: 0.1 10*3/uL (ref 0.0–0.2)
Basos: 1 %
EOS (ABSOLUTE): 0.1 10*3/uL (ref 0.0–0.4)
Eos: 2 %
Hematocrit: 36.7 % (ref 34.0–46.6)
Hemoglobin: 12.4 g/dL (ref 11.1–15.9)
Immature Grans (Abs): 0 10*3/uL (ref 0.0–0.1)
Immature Granulocytes: 0 %
Lymphocytes Absolute: 3.5 10*3/uL — ABNORMAL HIGH (ref 0.7–3.1)
Lymphs: 43 %
MCH: 31.5 pg (ref 26.6–33.0)
MCHC: 33.8 g/dL (ref 31.5–35.7)
MCV: 93 fL (ref 79–97)
Monocytes Absolute: 0.9 10*3/uL (ref 0.1–0.9)
Monocytes: 11 %
Neutrophils Absolute: 3.5 10*3/uL (ref 1.4–7.0)
Neutrophils: 43 %
Platelets: 343 10*3/uL (ref 150–450)
RBC: 3.94 x10E6/uL (ref 3.77–5.28)
RDW: 11.2 % — ABNORMAL LOW (ref 11.7–15.4)
WBC: 8.2 10*3/uL (ref 3.4–10.8)

## 2020-01-24 LAB — COMPREHENSIVE METABOLIC PANEL
ALT: 17 IU/L (ref 0–32)
AST: 22 IU/L (ref 0–40)
Albumin/Globulin Ratio: 1.3 (ref 1.2–2.2)
Albumin: 4.1 g/dL (ref 3.7–4.7)
Alkaline Phosphatase: 77 IU/L (ref 44–121)
BUN/Creatinine Ratio: 24 (ref 12–28)
BUN: 21 mg/dL (ref 8–27)
Bilirubin Total: 0.5 mg/dL (ref 0.0–1.2)
CO2: 26 mmol/L (ref 20–29)
Calcium: 9 mg/dL (ref 8.7–10.3)
Chloride: 96 mmol/L (ref 96–106)
Creatinine, Ser: 0.86 mg/dL (ref 0.57–1.00)
GFR calc Af Amer: 74 mL/min/{1.73_m2} (ref >59)
GFR calc non Af Amer: 64 mL/min/{1.73_m2} (ref >59)
Globulin, Total: 3.1 g/dL (ref 1.5–4.5)
Glucose: 90 mg/dL (ref 65–99)
Potassium: 4.8 mmol/L (ref 3.5–5.2)
Sodium: 132 mmol/L — ABNORMAL LOW (ref 134–144)
Total Protein: 7.2 g/dL (ref 6.0–8.5)

## 2020-01-24 LAB — LIPID PANEL
Chol/HDL Ratio: 2.7 ratio (ref 0.0–4.4)
Cholesterol, Total: 185 mg/dL (ref 100–199)
HDL: 68 mg/dL (ref >39)
LDL Chol Calc (NIH): 109 mg/dL — ABNORMAL HIGH (ref 0–99)
Triglycerides: 37 mg/dL (ref 0–149)
VLDL Cholesterol Cal: 8 mg/dL (ref 5–40)

## 2020-01-24 LAB — HEMOGLOBIN A1C
Est. average glucose Bld gHb Est-mCnc: 100 mg/dL
Hgb A1c MFr Bld: 5.1 % (ref 4.8–5.6)

## 2020-01-24 LAB — TSH: TSH: 1.74 u[IU]/mL (ref 0.450–4.500)

## 2020-02-05 ENCOUNTER — Telehealth: Payer: Self-pay | Admitting: Physician Assistant

## 2020-02-05 NOTE — Telephone Encounter (Signed)
Patient was seen before Christmas for labs and would like to know the results of the labs. [phone], thanks.

## 2020-02-05 NOTE — Telephone Encounter (Signed)
Patient called office back and has been made aware of the results and verbalized understanding. Patient is transferring care and will follow up with the new provider on 02/08/19. AS, CMA

## 2020-02-05 NOTE — Telephone Encounter (Signed)
I have attempted to call patient several times and can not get through. The message for the number left says "the call can not be completed" and then line disconnects. Will sent lab results via mail. AS, CMA

## 2020-02-08 ENCOUNTER — Other Ambulatory Visit: Payer: Self-pay

## 2020-02-08 ENCOUNTER — Ambulatory Visit: Payer: Medicare Other | Attending: Internal Medicine | Admitting: Internal Medicine

## 2020-02-08 ENCOUNTER — Encounter: Payer: Self-pay | Admitting: Internal Medicine

## 2020-02-08 VITALS — BP 154/72 | HR 66 | Ht 62.0 in | Wt 130.8 lb

## 2020-02-08 DIAGNOSIS — E871 Hypo-osmolality and hyponatremia: Secondary | ICD-10-CM

## 2020-02-08 DIAGNOSIS — Z7689 Persons encountering health services in other specified circumstances: Secondary | ICD-10-CM

## 2020-02-08 DIAGNOSIS — E039 Hypothyroidism, unspecified: Secondary | ICD-10-CM | POA: Diagnosis not present

## 2020-02-08 DIAGNOSIS — R0981 Nasal congestion: Secondary | ICD-10-CM

## 2020-02-08 DIAGNOSIS — Z87898 Personal history of other specified conditions: Secondary | ICD-10-CM

## 2020-02-08 DIAGNOSIS — I10 Essential (primary) hypertension: Secondary | ICD-10-CM | POA: Diagnosis not present

## 2020-02-08 NOTE — Progress Notes (Signed)
Having pain in left leg and neck.  Having sinus pressure.

## 2020-02-08 NOTE — Progress Notes (Signed)
Patient ID: Kimberly Merritt, female    DOB: January 10, 1941  MRN: [id number]  CC: New Patient (Initial Visit)   Subjective: Kimberly Merritt is a 80 y.o. 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 Jana Hakim, prediabetes, age-related macular degeneration, chronic venous insufficiency  Previous PCP was Dr. Harlow Mares who left.  She was then assigned to [place].  Changed because she wanted an MD. she is followed by several specialists including Dr. Scot Dock for chronic venous insufficiency, cardiology Dr. Percival Spanish, oncology for MGUS  HYPERTENSION/ICM Currently taking: see medication list.  She is on carvedilol and Cozaar Med Adherence: [x]  Yes    []  No Medication side effects: []  Yes    [x]  No Adherence with salt restriction: [x]  Yes    []  No Home Monitoring?: [x]  Yes    []  No Monitoring Frequency: several times a wk Home BP results range: for past several days BP running higher.  Usually blood pressure 130s/70s before meds in the mornings.  She thinks blood pressure is running a little higher because she is dealing with some pain in her neck due to arthritis for home she sees orthopedics.  Blood pressure at home this morning was 137/72 SOB? []  Yes    [x]  No Chest Pain?: []  Yes    [x]  No Leg swelling?: [x]  Yes  Due to venous insuff Headaches?: []  Yes    [x]  No Dizziness? []  Yes    [x]  No Comments:   Seeing Dr. Molli Hazard at [facility] for pain in neck due to DJD.  She also has osteoarthritis in her hands.  PreDM:  No longer in range for prediabetes based on last 2 A1c that I see in the system.Marland Kitchen Appetite not good.  Does her own cooking. Indepentent in ADLs.  She walks unassisted.  She still drives.  She has not had any falls recently.  She is widowed.  She lives alone.  Does not have any children.  Complains of having Little  sinus congestion today.  No rhinorrhea or discolored mucus from the nose.  She has not had any fever.  Slight feeling of pressure over the maxillary sinuses.  HM:  Complete COVID series and flu shot  Past medical, surgical, social and family history reviewed. Patient Active Problem List   Diagnosis Date Noted  . Chronic fatigue syndrome 01/16/2020  . Diaphragmatic hernia 01/16/2020  . Stable angina ([place]) 01/16/2020  . Intermediate stage nonexudative age-related macular degeneration of both eyes 01/09/2020  . Posterior vitreous detachment of right eye 01/09/2020  . Degenerative retinal drusen, both eyes 01/09/2020  . Vitreous hemorrhage of left eye ([place]) 01/09/2020  . Precordial pain 10/17/2019  . 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  . Estrogen deficiency 02/20/2019  . Macular degeneration of left eye 02/20/2019  . Counseled about COVID-19 virus infection/  vaccine 02/20/2019  . Osteopenia after menopause 12/07/2018  . h/o foot Bone spur- of the calcium type 12/07/2018  . Atherosclerotic cardiovascular disease 12/07/2018  . Family history of abdominal aortic aneurysm (AAA)- father, elder 12/07/2018  . 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 Hyperlipidemia 03/09/2018  . Immunocompromised patient ([place]) 12/01/2017  . Chronic pain of right knee-since age 40 per patient 04/12/2017  . Prediabetes 04/12/2017  . Vitamin D insufficiency 04/12/2017  . CAD (coronary artery disease) 04/05/2017  . Hypertension 04/05/2017  . Atherosclerosis of aorta ([place]) 04/05/2017  .  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 History of colonic polyps 04/05/2017  . Scoliosis 04/05/2017  . Ventricular premature depolarization 04/05/2017  . Benign colonic polyp- found in 07/2011- told f/up Dr Watt Climes 5 yrs 04/05/2017  . disabled status- since age 47 due to RA in [place] 04/05/2017  . Environmental and seasonal allergies 04/05/2017  . History of hypoglycemia 04/05/2017  . Post-menopausal 04/05/2017  .  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 PVC's (premature ventricular contractions) 05/18/2016  . Chronic cough 10/23/2014  . Nonischemic cardiomyopathy ([place]) 11/29/2013  . Bradycardia 09/25/2013  . Lip lesion 08/21/2013  . Diverticulosis of colon without hemorrhage 05/02/2013  . Paresthesia 06/20/2012  . Well adult exam 08/31/2011  . Cough 08/10/2011  . Sinusitis, acute frontal 02/15/2011  . Shoulder pain, right 07/09/2010  . LIVER HEMANGIOMA 03/03/2010  . 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 MUSCLE SPASM 03/03/2010  . Herpes labialis 12/01/2009  . Disorder of bone and cartilage 11/19/2008  . HEPATIC CYST 08/02/2007  . Allergic rhinitis 04/25/2007  . HOARSENESS 04/25/2007  . Hypothyroidism (acquired) 08/29/2006  . 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 Gastroesophageal reflux disease 08/29/2006  . Generalized osteoarthritis 08/29/2006  . LOW BACK PAIN 08/29/2006  . Pain in Soft Tissues of Limb 08/29/2006     Current Outpatient Medications on File Prior to Visit  Medication Sig Dispense Refill  . carvedilol (COREG) 12.5 MG tablet TAKE 1 TABLET BY MOUTH 2 TIMES DAILY. 180 tablet 3  . Cholecalciferol (VITAMIN D3) 50 MCG (2000 UT) capsule Take 1 capsule by mouth daily.    . cromolyn (NASALCROM) 5.2 MG/ACT nasal spray Place 1 spray into both nostrils 4 (four) times daily.    . fexofenadine (ALLEGRA) 180 MG tablet Take 180 mg by mouth daily.    . Glucosamine-Chondroitin 1500-1200 MG/30ML LIQD Take 1 tablet by mouth daily.    Marland Kitchen losartan (COZAAR) 25 MG tablet TAKE 1 TABLET BY MOUTH DAILY. 90 tablet 2  . pantoprazole (PROTONIX) 40 MG tablet Take 40 mg by mouth daily.    Vladimir Faster Glycol-Propyl Glycol 0.4-0.3 % SOLN Apply to eye. 2 drops in each eye 3 to 4 times daily    . SALINE NASAL SPRAY NA Place into the nose. 2 sprays each nostril every 4 hours    . sucralfate (CARAFATE) 1 g tablet Take 1 g by mouth daily as needed.     Marland Kitchen SYNTHROID 25 MCG tablet TAKE 1 TABLET BY  MOUTH DAILY BEFORE BREAKFAST 90 tablet 0   No current facility-administered medications on file prior to visit.    Allergies  Allergen Reactions  . Acetaminophen Rash    REACTION: high dose - tremor Per pt - rash on chest and heart palpitations  . Alendronate Sodium     Stomach upset.   . Codeine     May interfere with heart condition    . Diflunisal     Stomach pain.    . Doxycycline     n/v  . Flexeril [Cyclobenzaprine]     Abdominal pain.    . Iodinated Diagnostic Agents Other (See Comments)    Syncope.   . Lisinopril Other (See Comments)  . Nabumetone     Abdominal pain; dark, tarry stool.    . Other Other (See Comments)  . Oxybutynin     Pt does not remember.    . Raloxifene     Unknown.    . Sulfa Antibiotics Nausea Only    Syncope.    . Symbicort [Budesonide-Formoterol Fumarate]     "  Jittery-ness."   . Tramadol Hcl     Numbness.    . Tramadol Hcl Itching    Numbness.    . Diclofenac Palpitations    Dark, tarry stool.    . Latex Rash    Social History   Socioeconomic History  . Marital status: Widowed    Spouse name: Not on file  . Number of children: 0  . Years of education: Not on file  . Highest education level: Not on file  Occupational History  . Occupation: Retired  Tobacco Use  . Smoking status: Never Smoker  . Smokeless tobacco: Never Used  Vaping Use  . Vaping Use: Never used  Substance and Sexual Activity  . Alcohol use: No    Alcohol/week: 0.0 standard drinks  . Drug use: No  . Sexual activity: Not Currently  Other Topics Concern  . Not on file  Social History Narrative   Lives alone.     Social Determinants of Health   Financial Resource Strain: Not on file  Food Insecurity: Not on file  Transportation Needs: Not on file  Physical Activity: Not on file  Stress: Not on file  Social Connections: Not on file  Intimate Partner Violence: Not on file    Family History  Problem Relation Age of Onset  . Stroke Mother   .  Cancer Father   . Emphysema Father   . Hypertension Other   . Breast cancer Neg Hx     Past Surgical History:  Procedure Laterality Date  . ABDOMINAL HYSTERECTOMY  1981  . APPENDECTOMY    . BACK SURGERY  2010,2006   lumb fusion  . BUNIONECTOMY     both  . BUNIONECTOMY Right 1985  . CARPAL TUNNEL RELEASE     both  . CARPAL TUNNEL RELEASE Right 1992  . CARPAL TUNNEL RELEASE Left 1992  . CATARACT EXTRACTION     both  . CATARACT EXTRACTION Right 2008  . CATARACT EXTRACTION Left 2008  . HAMMER TOE SURGERY Left 1987  . HAMMER TOE SURGERY Right 1986  . LEFT AND RIGHT HEART CATHETERIZATION WITH CORONARY ANGIOGRAM N/A 10/04/2013   Procedure: LEFT AND RIGHT HEART CATHETERIZATION WITH CORONARY ANGIOGRAM;  Surgeon: Leonie Man, MD;  [facility] C[facility] LAB;  Service: Cardiovascular;  Laterality: N/A;  . LUMBAR FUSION  2006  . LUMBAR FUSION  2010  . OOPHORECTOMY  1967  . OVARIAN CYST REMOVAL    . ROTATOR CUFF REPAIR Right 2013  . SHOULDER ARTHROSCOPY  02/09/2011   Procedure: ARTHROSCOPY SHOULDER;  Surgeon: Hessie Dibble, MD;  Location: [facility];  Service: Orthopedics;  Laterality: Right;  right shoulder arthroscopy subacromial decompression with rotator cuff repair  . TONSILLECTOMY      ROS: Review of Systems Negative except as stated above  PHYSICAL EXAM: BP (!) 154/72   Pulse 66   Ht 5\' 2"  (1.575 m)   Wt 130 lb 12.8 oz (59.3 kg)   SpO2 97%   BMI 23.92 kg/m   Wt Readings from Last 3 Encounters:  02/08/20 130 lb 12.8 oz (59.3 kg)  01/16/20 135 lb 6.4 oz (61.4 kg)  12/04/19 131 lb 8 oz (59.6 kg)  Repeat blood pressure was BP 160/70  Physical Exam  General appearance -pleasant elderly Caucasian female in NAD Mental status - normal mood, behavior, speech, dress, motor activity, and thought processes Nose -slight enlargement of the nasal turbinates.  Nasal mucosa dry.   Mouth - mucous membranes moist, pharynx normal  without lesions Neck - no significant  adenopathy.  No thyromegaly Chest - clear to auscultation, no wheezes, rales or rhonchi, symmetric air entry Heart - normal rate, regular rhythm, normal S1, S2, no murmurs, rubs, clicks or gallops Musculoskeletal -enlargement of PIP joints Extremities -she is wearing compression socks.  Legs tender to touch  CMP Latest Ref Rng & Units 01/23/2020 12/04/2019 06/12/2019  Glucose 65 - 99 mg/dL 90 96 92  BUN 8 - 27 mg/dL 21 17 16   Creatinine 0.57 - 1.00 mg/dL 0.86 0.79 0.84  Sodium 134 - 144 mmol/L 132(L) 135 138  Potassium 3.5 - 5.2 mmol/L 4.8 4.3 4.4  Chloride 96 - 106 mmol/L 96 100 101  CO2 20 - 29 mmol/L 26 26 28   Calcium 8.7 - 10.3 mg/dL 9.0 9.1 8.8(L)  Total Protein 6.0 - 8.5 g/dL 7.2 7.2 7.0  Total Bilirubin 0.0 - 1.2 mg/dL 0.5 0.4 0.5  Alkaline Phos 44 - 121 IU/L 77 77 76  AST 0 - 40 IU/L 22 21 17   ALT 0 - 32 IU/L 17 17 16    Lipid Panel     Component Value Date/Time   CHOL 185 01/23/2020 0822   TRIG 37 01/23/2020 0822   HDL 68 01/23/2020 0822   CHOLHDL 2.7 01/23/2020 0822   CHOLHDL 2 06/08/2011 0830   VLDL 6.4 06/08/2011 0830   LDLCALC 109 (H) 01/23/2020 0822   LDLDIRECT 106 (H) 10/23/2018 1110   LDLDIRECT 131.0 02/05/2008 0931    CBC    Component Value Date/Time   WBC 8.2 01/23/2020 0822   WBC 8.9 12/04/2019 1042   RBC 3.94 01/23/2020 0822   RBC 4.09 12/04/2019 1042   HGB 12.4 01/23/2020 0822   HGB 12.2 11/04/2016 1115   HCT 36.7 01/23/2020 0822   HCT 36.8 11/04/2016 1115   PLT 343 01/23/2020 0822   MCV 93 01/23/2020 0822   MCV 94.4 11/04/2016 1115   MCH 31.5 01/23/2020 0822   MCH 30.8 12/04/2019 1042   MCHC 33.8 01/23/2020 0822   MCHC 33.0 12/04/2019 1042   RDW 11.2 (L) 01/23/2020 0822   RDW 12.5 11/04/2016 1115   LYMPHSABS 3.5 (H) 01/23/2020 0822   LYMPHSABS 2.6 11/04/2016 1115   MONOABS 1.2 (H) 12/04/2019 1042   MONOABS 0.9 11/04/2016 1115   EOSABS 0.1 01/23/2020 0822   BASOSABS 0.1 01/23/2020 0822   BASOSABS 0.0 11/04/2016 1115    ASSESSMENT AND  PLAN:  1. Encounter to establish care Chart reviewed.  2. Essential hypertension Patient reports blood pressure has been running good at home until the past several days with systolic blood pressure in the 130s.  Blood pressure higher today.  We discussed having her come back to see our clinical pharmacist in about 2 weeks for repeat blood pressure check or increasing the Cozaar to 50 mg.  Patient states that Dr. Percival Spanish her cardiologist has been keeping an eye on her blood pressure for quite some time and she will follow-up with him first.  3. Sinus congestion She will continue the Allegra and Nasalcrom nasal spray.  Advised that she can also use some Vicks vapor rub.  4. Acquired hypothyroidism Recent TSH normal.  Continue current dose of Synthroid  5. History of prediabetes Last 2 A1c's were normal.  Encourage healthy eating habits.  6. Hyponatremia Sodium on last blood test done the latter part of last month was slightly low at 132.  Previous readings were okay.  She is not on a diuretic.  Declined repeat chemistry at  this time.  Prefers to have his recheck on follow-up visit.    Patient was given the opportunity to ask questions.  Patient verbalized understanding of the plan and was able to repeat key elements of the plan.   No orders of the defined types were placed in this encounter.    Requested Prescriptions    No prescriptions requested or ordered in this encounter    Return in about 4 months (around 06/07/2020).  Karle Plumber, MD, FACP

## 2020-02-12 DIAGNOSIS — M5412 Radiculopathy, cervical region: Secondary | ICD-10-CM | POA: Diagnosis not present

## 2020-02-12 DIAGNOSIS — M5030 Other cervical disc degeneration, unspecified cervical region: Secondary | ICD-10-CM | POA: Diagnosis not present

## 2020-02-14 ENCOUNTER — Telehealth: Payer: Self-pay | Admitting: Physician Assistant

## 2020-02-14 NOTE — Telephone Encounter (Signed)
patient stopped by office, asked if she could have her lab results from 01/23/20. Printed and given to patient.

## 2020-02-21 ENCOUNTER — Ambulatory Visit (INDEPENDENT_AMBULATORY_CARE_PROVIDER_SITE_OTHER): Payer: Medicare Other | Admitting: Vascular Surgery

## 2020-02-21 ENCOUNTER — Encounter (HOSPITAL_COMMUNITY): Payer: Medicare Other

## 2020-02-26 DIAGNOSIS — M5412 Radiculopathy, cervical region: Secondary | ICD-10-CM | POA: Diagnosis not present

## 2020-02-26 DIAGNOSIS — M5030 Other cervical disc degeneration, unspecified cervical region: Secondary | ICD-10-CM | POA: Diagnosis not present

## 2020-02-28 DIAGNOSIS — M5030 Other cervical disc degeneration, unspecified cervical region: Secondary | ICD-10-CM | POA: Diagnosis not present

## 2020-02-28 DIAGNOSIS — M5412 Radiculopathy, cervical region: Secondary | ICD-10-CM | POA: Diagnosis not present

## 2020-03-04 DIAGNOSIS — M5412 Radiculopathy, cervical region: Secondary | ICD-10-CM | POA: Diagnosis not present

## 2020-03-04 DIAGNOSIS — M5030 Other cervical disc degeneration, unspecified cervical region: Secondary | ICD-10-CM | POA: Diagnosis not present

## 2020-03-06 DIAGNOSIS — M5412 Radiculopathy, cervical region: Secondary | ICD-10-CM | POA: Diagnosis not present

## 2020-03-06 DIAGNOSIS — M5030 Other cervical disc degeneration, unspecified cervical region: Secondary | ICD-10-CM | POA: Diagnosis not present

## 2020-03-11 DIAGNOSIS — M5412 Radiculopathy, cervical region: Secondary | ICD-10-CM | POA: Diagnosis not present

## 2020-03-11 DIAGNOSIS — M5030 Other cervical disc degeneration, unspecified cervical region: Secondary | ICD-10-CM | POA: Diagnosis not present

## 2020-03-13 DIAGNOSIS — M5412 Radiculopathy, cervical region: Secondary | ICD-10-CM | POA: Diagnosis not present

## 2020-03-13 DIAGNOSIS — M5030 Other cervical disc degeneration, unspecified cervical region: Secondary | ICD-10-CM | POA: Diagnosis not present

## 2020-03-15 DIAGNOSIS — M25512 Pain in left shoulder: Secondary | ICD-10-CM | POA: Diagnosis not present

## 2020-03-17 ENCOUNTER — Other Ambulatory Visit (HOSPITAL_COMMUNITY): Payer: Self-pay | Admitting: Orthopaedic Surgery

## 2020-03-17 ENCOUNTER — Other Ambulatory Visit: Payer: Self-pay

## 2020-03-17 ENCOUNTER — Ambulatory Visit (HOSPITAL_COMMUNITY)
Admission: RE | Admit: 2020-03-17 | Discharge: 2020-03-17 | Disposition: A | Discharge status: Home / Self Care | Payer: Medicare Other | Source: Ambulatory Visit | Attending: Orthopaedic Surgery | Admitting: Orthopaedic Surgery

## 2020-03-17 DIAGNOSIS — M79662 Pain in left lower leg: Secondary | ICD-10-CM

## 2020-03-17 DIAGNOSIS — R600 Localized edema: Secondary | ICD-10-CM

## 2020-03-18 DIAGNOSIS — M5030 Other cervical disc degeneration, unspecified cervical region: Secondary | ICD-10-CM | POA: Diagnosis not present

## 2020-03-18 DIAGNOSIS — M5412 Radiculopathy, cervical region: Secondary | ICD-10-CM | POA: Diagnosis not present

## 2020-03-18 DIAGNOSIS — M7542 Impingement syndrome of left shoulder: Secondary | ICD-10-CM | POA: Diagnosis not present

## 2020-03-20 DIAGNOSIS — M5030 Other cervical disc degeneration, unspecified cervical region: Secondary | ICD-10-CM | POA: Diagnosis not present

## 2020-03-20 DIAGNOSIS — M5412 Radiculopathy, cervical region: Secondary | ICD-10-CM | POA: Diagnosis not present

## 2020-03-20 DIAGNOSIS — M7542 Impingement syndrome of left shoulder: Secondary | ICD-10-CM | POA: Diagnosis not present

## 2020-03-27 DIAGNOSIS — M7542 Impingement syndrome of left shoulder: Secondary | ICD-10-CM | POA: Diagnosis not present

## 2020-03-27 DIAGNOSIS — M5030 Other cervical disc degeneration, unspecified cervical region: Secondary | ICD-10-CM | POA: Diagnosis not present

## 2020-03-27 DIAGNOSIS — M5412 Radiculopathy, cervical region: Secondary | ICD-10-CM | POA: Diagnosis not present

## 2020-04-01 DIAGNOSIS — M7542 Impingement syndrome of left shoulder: Secondary | ICD-10-CM | POA: Diagnosis not present

## 2020-04-01 DIAGNOSIS — M5030 Other cervical disc degeneration, unspecified cervical region: Secondary | ICD-10-CM | POA: Diagnosis not present

## 2020-04-01 DIAGNOSIS — M5412 Radiculopathy, cervical region: Secondary | ICD-10-CM | POA: Diagnosis not present

## 2020-04-03 ENCOUNTER — Encounter: Payer: Self-pay | Admitting: Vascular Surgery

## 2020-04-03 ENCOUNTER — Ambulatory Visit: Payer: Medicare Other | Admitting: Vascular Surgery

## 2020-04-03 ENCOUNTER — Other Ambulatory Visit: Payer: Self-pay

## 2020-04-03 ENCOUNTER — Ambulatory Visit (HOSPITAL_COMMUNITY)
Admission: RE | Admit: 2020-04-03 | Discharge: 2020-04-03 | Disposition: A | Discharge status: Home / Self Care | Payer: Medicare Other | Source: Ambulatory Visit | Attending: Vascular Surgery | Admitting: Vascular Surgery

## 2020-04-03 VITALS — BP 128/72 | HR 71 | Temp 97.5°F | Resp 14 | Ht 62.0 in | Wt 127.0 lb

## 2020-04-03 DIAGNOSIS — I872 Venous insufficiency (chronic) (peripheral): Secondary | ICD-10-CM

## 2020-04-03 DIAGNOSIS — M5030 Other cervical disc degeneration, unspecified cervical region: Secondary | ICD-10-CM | POA: Diagnosis not present

## 2020-04-03 DIAGNOSIS — M5412 Radiculopathy, cervical region: Secondary | ICD-10-CM | POA: Diagnosis not present

## 2020-04-03 DIAGNOSIS — I83813 Varicose veins of bilateral lower extremities with pain: Secondary | ICD-10-CM | POA: Diagnosis not present

## 2020-04-03 DIAGNOSIS — I8393 Asymptomatic varicose veins of bilateral lower extremities: Secondary | ICD-10-CM

## 2020-04-03 DIAGNOSIS — M7542 Impingement syndrome of left shoulder: Secondary | ICD-10-CM | POA: Diagnosis not present

## 2020-04-03 NOTE — Progress Notes (Signed)
REASON FOR VISIT:   Follow-up of chronic venous insufficiency  MEDICAL ISSUES:   CHRONIC VENOUS INSUFFICIENCY WITH PAINFUL VARICOSE VEINS: This patient does have some superficial and deep venous reflux on the left.  The reflux on the left is limited to a a very short segment in the proximal thigh which is tortuous.  Thus I do not think this is amenable to laser angioplasty.  We have discussed the importance of intermittent leg elevation and the proper positioning for this.  We are going to try to fit her for some thigh-high compression stockings with a milder gradient as this may be more comfortable.  I encouraged her to avoid prolonged sitting and standing.  We discussed the importance of exercise.  With respect to her sciatica she does not like to take ibuprofen.  I have suggested rest and elevation.  I'll see her back as needed.   HPI:   Kimberly Merritt is a pleasant 80 y.o. female who I saw in consultation on 11/14/2019 with left lower extremity pain and swelling.  She had evidence of chronic venous insufficiency with painful varicose veins.  She had CAEP C4a venous disease.  On exam she had some dilated varicose veins bilaterally.  Of note she had palpable femoral and pedal pulses bilaterally.  She had a DVT study at the last visit which showed no evidence of DVT.  She did not have a reflux study.  She comes in for 68-month follow-up visit no reflux study.   On my history today she continues to have pain in her left leg.  However she states that the pain begins in her lower leg and extends up the back of her leg to her hip.  Sounds like she may be having sciatica.  She does have some aching pain in her legs aggravated by standing and sitting and relieved with elevation.  She has been wearing her thigh-high stocking which helps her symptoms some.  She did have previous laser ablation of the left great saphenous vein by Dr. Kellie Simmering in the past.  Past Medical History:  Diagnosis Date  .  Allergy   . CAD (coronary artery disease)    Nonobstructive. LAD 30% stenosis, small PDA of the circumflex 70% stenosis, right coronary artery 40% stenosis. Catheterization September 2015  . DEPRESSION 08/29/2006  . Diverticulosis   . GERD 08/29/2006  . HEPATIC CYST 08/02/2007  . Hypertension   . HYPOTHYROIDISM 08/29/2006  . LIVER HEMANGIOMA 03/03/2010  . LOW BACK PAIN 08/29/2006  . 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 MUSCLE SPASM 03/03/2010  . Nonischemic cardiomyopathy ([facility])    EF 30%  . OSTEOARTHRITIS 08/29/2006  . OSTEOPENIA 11/19/2008  . PARESTHESIA 03/05/2009  . PVC's (premature ventricular contractions)   . Scoliosis   . VARICOSE VEINS, LOWER EXTREMITIES 08/29/2006    Family History  Problem Relation Age of Onset  . Stroke Mother   . Cancer Father   . Emphysema Father   . Hypertension Other   . Breast cancer Neg Hx     SOCIAL HISTORY: Social History   Tobacco Use  . Smoking status: Never Smoker  . Smokeless tobacco: Never Used  Substance Use Topics  . Alcohol use: No    Alcohol/week: 0.0 standard drinks    Allergies  Allergen Reactions  . Acetaminophen Rash    REACTION: high dose - tremor Per pt - rash on chest and heart palpitations  . Alendronate Sodium     Stomach upset.   . Codeine  May interfere with heart condition    . Diflunisal     Stomach pain.    . Doxycycline     n/v  . Flexeril [Cyclobenzaprine]     Abdominal pain.    . Iodinated Diagnostic Agents Other (See Comments)    Syncope.   . Lisinopril Other (See Comments)  . Nabumetone     Abdominal pain; dark, tarry stool.    . Other Other (See Comments)  . Oxybutynin     Pt does not remember.    . Raloxifene     Unknown.    . Sulfa Antibiotics Nausea Only    Syncope.    . Symbicort [Budesonide-Formoterol Fumarate]     "Jittery-ness."   . Tramadol Hcl     Numbness.    . Tramadol Hcl Itching    Numbness.    . Diclofenac Palpitations    Dark, tarry stool.    . Latex Rash    Current  Outpatient Medications  Medication Sig Dispense Refill  . carvedilol (COREG) 12.5 MG tablet TAKE 1 TABLET BY MOUTH 2 TIMES DAILY. 180 tablet 3  . Cholecalciferol (VITAMIN D3) 50 MCG (2000 UT) capsule Take 1 capsule by mouth daily.    . cromolyn (NASALCROM) 5.2 MG/ACT nasal spray Place 1 spray into both nostrils 4 (four) times daily.    . fexofenadine (ALLEGRA) 180 MG tablet Take 180 mg by mouth daily.    . Glucosamine-Chondroitin 1500-1200 MG/30ML LIQD Take 1 tablet by mouth daily.    Marland Kitchen losartan (COZAAR) 25 MG tablet TAKE 1 TABLET BY MOUTH DAILY. 90 tablet 2  . pantoprazole (PROTONIX) 40 MG tablet Take 40 mg by mouth daily.    Vladimir Faster Glycol-Propyl Glycol 0.4-0.3 % SOLN Apply to eye. 2 drops in each eye 3 to 4 times daily    . SALINE NASAL SPRAY NA Place into the nose. 2 sprays each nostril every 4 hours    . sucralfate (CARAFATE) 1 g tablet Take 1 g by mouth daily as needed.     Marland Kitchen SYNTHROID 25 MCG tablet TAKE 1 TABLET BY MOUTH DAILY BEFORE BREAKFAST 90 tablet 0   No current facility-administered medications for this visit.    REVIEW OF SYSTEMS:  [X]  denotes positive finding, [ ]  denotes negative finding Cardiac  Comments:  Chest pain or chest pressure:    Shortness of breath upon exertion:    Short of breath when lying flat:    Irregular heart rhythm:        Vascular    Pain in calf, thigh, or hip brought on by ambulation: x   Pain in feet at night that wakes you up from your sleep:     Blood clot in your veins:    Leg swelling:  x       Pulmonary    Oxygen at home:    Productive cough:     Wheezing:         Neurologic    Sudden weakness in arms or legs:     Sudden numbness in arms or legs:     Sudden onset of difficulty speaking or slurred speech:    Temporary loss of vision in one eye:     Problems with dizziness:         Gastrointestinal    Blood in stool:     Vomited blood:         Genitourinary    Burning when urinating:     Blood in urine:  Psychiatric    Major depression:         Hematologic    Bleeding problems:    Problems with blood clotting too easily:        Skin    Rashes or ulcers:        Constitutional    Fever or chills:     PHYSICAL EXAM:   Vitals:   04/03/20 1454  BP: 128/72  Pulse: 71  Resp: 14  Temp: (!) 97.5 F (36.4 C)  TempSrc: Temporal  SpO2: 98%  Weight: 57.6 kg  Height: 5\' 2"  (1.575 m)    GENERAL: The patient is a well-nourished female, in no acute distress. The vital signs are documented above. CARDIAC: There is a regular rate and rhythm.  VASCULAR: I do not detect carotid bruits. She has palpable dorsalis pedis pulses. She has varicose veins of both lower extremities. I looked at her proximal great saphenous vein myself with the SonoSite.  This is likely a branch from the saphenofemoral junction.  It is a fairly short segment and I do not think this is amenable to laser angioplasty. PULMONARY: There is good air exchange bilaterally without wheezing or rales.  MUSCULOSKELETAL: There are no major deformities or cyanosis. NEUROLOGIC: No focal weakness or paresthesias are detected. SKIN: There are no ulcers or rashes noted. PSYCHIATRIC: The patient has a normal affect.  DATA:    VENOUS REFLUX STUDY: I have independently interpreted her venous reflux study of the left lower extremity.  There is no evidence of DVT or superficial venous thrombosis.  There is deep venous reflux involving the common femoral vein.  There is reflux in the superficial system that involves the saphenofemoral junction and the great saphenous vein in the proximal thigh.  The vein here is noted to be tortuous with diameters ranging from 0.42-0.91 cm.  The saphenous vein exits the fascia in the mid thigh.  A total of 39 minutes was spent on this visit. 15 minutes was face to face time. More than 50% of the time was spent on counseling and coordinating with the patient.    Deitra [facility] [phone]

## 2020-04-08 DIAGNOSIS — M7542 Impingement syndrome of left shoulder: Secondary | ICD-10-CM | POA: Diagnosis not present

## 2020-04-08 DIAGNOSIS — M5030 Other cervical disc degeneration, unspecified cervical region: Secondary | ICD-10-CM | POA: Diagnosis not present

## 2020-04-08 DIAGNOSIS — M5412 Radiculopathy, cervical region: Secondary | ICD-10-CM | POA: Diagnosis not present

## 2020-04-10 DIAGNOSIS — M7542 Impingement syndrome of left shoulder: Secondary | ICD-10-CM | POA: Diagnosis not present

## 2020-04-10 DIAGNOSIS — M5030 Other cervical disc degeneration, unspecified cervical region: Secondary | ICD-10-CM | POA: Diagnosis not present

## 2020-04-10 DIAGNOSIS — M5412 Radiculopathy, cervical region: Secondary | ICD-10-CM | POA: Diagnosis not present

## 2020-04-15 DIAGNOSIS — M7542 Impingement syndrome of left shoulder: Secondary | ICD-10-CM | POA: Diagnosis not present

## 2020-04-15 DIAGNOSIS — M5412 Radiculopathy, cervical region: Secondary | ICD-10-CM | POA: Diagnosis not present

## 2020-04-15 DIAGNOSIS — M5030 Other cervical disc degeneration, unspecified cervical region: Secondary | ICD-10-CM | POA: Diagnosis not present

## 2020-04-16 DIAGNOSIS — M25512 Pain in left shoulder: Secondary | ICD-10-CM | POA: Diagnosis not present

## 2020-04-16 DIAGNOSIS — M79605 Pain in left leg: Secondary | ICD-10-CM | POA: Diagnosis not present

## 2020-04-17 DIAGNOSIS — M5412 Radiculopathy, cervical region: Secondary | ICD-10-CM | POA: Diagnosis not present

## 2020-04-17 DIAGNOSIS — M7542 Impingement syndrome of left shoulder: Secondary | ICD-10-CM | POA: Diagnosis not present

## 2020-04-17 DIAGNOSIS — M5030 Other cervical disc degeneration, unspecified cervical region: Secondary | ICD-10-CM | POA: Diagnosis not present

## 2020-04-22 DIAGNOSIS — M5416 Radiculopathy, lumbar region: Secondary | ICD-10-CM | POA: Diagnosis not present

## 2020-04-23 ENCOUNTER — Ambulatory Visit: Payer: Medicare Other | Attending: Physician Assistant | Admitting: Physician Assistant

## 2020-04-23 ENCOUNTER — Other Ambulatory Visit: Payer: Self-pay | Admitting: Cardiology

## 2020-04-23 ENCOUNTER — Other Ambulatory Visit: Payer: Self-pay

## 2020-04-23 DIAGNOSIS — E039 Hypothyroidism, unspecified: Secondary | ICD-10-CM

## 2020-04-23 MED ORDER — SYNTHROID 25 MCG PO TABS: ORAL_TABLET | ORAL | 1 refills | Status: DC

## 2020-04-23 NOTE — Progress Notes (Signed)
Virtual Visit via Telephone Note  I connected with Kimberly Merritt on 04/23/20 at 11:10 AM EDT by telephone and verified that I am speaking with the correct person using two identifiers.  Location: Patient: Home Provider: [facility] office   I discussed the limitations, risks, security and privacy concerns of performing an evaluation and management service by telephone and the availability of in person appointments. I also discussed with the patient that there may be a patient responsible charge related to this service. The patient expressed understanding and agreed to proceed.   History of Present Illness:  Patient needs Rf on Synthroid.  Last TSH 01/2020 and normal.    Blood pressure at home this morning 119/61, pulse 65, temp 97.6, weight 129    Observations/Objective:  NAD.  A&Ox3   Assessment and Plan: 1. Hypothyroidism, unspecified type DAW - SYNTHROID 25 MCG tablet; TAKE 1 TABLET BY MOUTH DAILY BEFORE BREAKFAST  Dispense: 90 tablet; Refill: 1    Follow Up Instructions: See PCP next month as planned   I discussed the assessment and treatment plan with the patient. The patient was provided an opportunity to ask questions and all were answered. The patient agreed with the plan and demonstrated an understanding of the instructions.   The patient was advised to call back or seek an in-person evaluation if the symptoms worsen or if the condition fails to improve as anticipated.  I provided 9 minutes of non-face-to-face time during this encounter.   Freeman Caldron, PA-C  Patient ID: Kimberly Merritt, female   DOB: Jun 04, 1940, 80 y.o.   MRN: [id number]

## 2020-04-29 DIAGNOSIS — M5416 Radiculopathy, lumbar region: Secondary | ICD-10-CM | POA: Diagnosis not present

## 2020-05-01 DIAGNOSIS — M5416 Radiculopathy, lumbar region: Secondary | ICD-10-CM | POA: Diagnosis not present

## 2020-05-13 ENCOUNTER — Encounter: Payer: Self-pay | Admitting: Internal Medicine

## 2020-05-13 ENCOUNTER — Ambulatory Visit: Payer: Medicare Other | Attending: Internal Medicine | Admitting: Internal Medicine

## 2020-05-13 ENCOUNTER — Other Ambulatory Visit: Payer: Self-pay

## 2020-05-13 VITALS — BP 122/66 | HR 65 | Temp 97.6°F

## 2020-05-13 DIAGNOSIS — R252 Cramp and spasm: Secondary | ICD-10-CM

## 2020-05-13 DIAGNOSIS — R300 Dysuria: Secondary | ICD-10-CM

## 2020-05-13 DIAGNOSIS — J301 Allergic rhinitis due to pollen: Secondary | ICD-10-CM | POA: Diagnosis not present

## 2020-05-13 DIAGNOSIS — R5383 Other fatigue: Secondary | ICD-10-CM | POA: Diagnosis not present

## 2020-05-13 DIAGNOSIS — E039 Hypothyroidism, unspecified: Secondary | ICD-10-CM | POA: Diagnosis not present

## 2020-05-13 DIAGNOSIS — M5416 Radiculopathy, lumbar region: Secondary | ICD-10-CM | POA: Diagnosis not present

## 2020-05-13 NOTE — Progress Notes (Signed)
Pt states she has been having muscle cramps in her legs

## 2020-05-13 NOTE — Progress Notes (Signed)
Virtual Visit via Telephone Note  I connected with Kimberly Merritt on 05/13/2020 at 4:29 p.m by telephone and verified that I am speaking with the correct person using two identifiers  Location: Patient: home Provider: office  Participants: Myself Patient CMA: Kimberly Merritt    I discussed the limitations, risks, security and privacy concerns of performing an evaluation and management service by telephone and the availability of in person appointments. I also discussed with the patient that there may be a patient responsible charge related to this service. The patient expressed understanding and agreed to proceed.   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 Scot Dock), OA.  Last saw me 02/2020.  Today's visit is for chronic disease management.  Patient complains of feeling tired with no energy.  Going on prior to pollen season but wonders if it is her allergies.  She has been having running nose, itchy eyes, sneezing.  On Claritin and cromolyn nasal spray.  Tried Claritin D for a few days but did not find it helpful so she went back to taking regular Claritin.  Tried Allerga 1 mth ago and did not find that helpful.  She was seen the allergist Dr. Donneta Romberg up until 2020 and was receiving allergy shots.  After a while she felt the shots were not helping much either.   -taking Synthroid daily as prescribed Getting in her 3 meals a day Getting in adequate sleep but waking more often to urinate.  Had a little bit of dysuria yesterday.  Drank some cranberry juice.  No dysuria today.  No fever recently  C/o muscle cramps at nights in her thighs and calf.  No problems during the day when she is up and walking.  She is not on statin therapy.  Outpatient Encounter Medications as of 05/13/2020  Medication  Sig  . carvedilol (COREG) 12.5 MG tablet TAKE 1 TABLET BY MOUTH 2 TIMES DAILY.  Marland Kitchen Cholecalciferol (VITAMIN D3) 50 MCG (2000 UT) capsule Take 1 capsule by mouth daily.  . cromolyn (NASALCROM) 5.2 MG/ACT nasal spray Place 1 spray into both nostrils 4 (four) times daily.  . fexofenadine (ALLEGRA) 180 MG tablet Take 180 mg by mouth daily.  . Glucosamine-Chondroitin 1500-1200 MG/30ML LIQD Take 1 tablet by mouth daily.  Marland Kitchen losartan (COZAAR) 25 MG tablet TAKE 1 TABLET BY MOUTH DAILY.  . pantoprazole (PROTONIX) 40 MG tablet Take 40 mg by mouth daily.  Vladimir Faster Glycol-Propyl Glycol 0.4-0.3 % SOLN Apply to eye. 2 drops in each eye 3 to 4 times daily  . SALINE NASAL SPRAY NA Place into the nose. 2 sprays each nostril every 4 hours  . sucralfate (CARAFATE) 1 g tablet Take 1 g by mouth daily as needed.   Marland Kitchen SYNTHROID 25 MCG tablet TAKE 1 TABLET BY MOUTH DAILY BEFORE BREAKFAST   No facility-administered encounter medications on file as of 05/13/2020.      Observations/Objective: Results for orders placed or performed in visit on 01/23/20  TSH  Result Value Ref Range   TSH 1.740 0.450 - 4.500 uIU/mL  Lipid panel  Result Value Ref Range   Cholesterol, Total 185 100 - 199 mg/dL   Triglycerides 37 0 - 149 mg/dL   HDL 68 >39 mg/dL   VLDL Cholesterol Cal 8 5 - 40 mg/dL   LDL Chol Calc (NIH) 109 (H) 0 - 99  mg/dL   Chol/HDL Ratio 2.7 0.0 - 4.4 ratio  Hemoglobin A1c  Result Value Ref Range   Hgb A1c MFr Bld 5.1 4.8 - 5.6 %   Est. average glucose Bld gHb Est-mCnc 100 mg/dL  Comprehensive metabolic panel  Result Value Ref Range   Glucose 90 65 - 99 mg/dL   BUN 21 8 - 27 mg/dL   Creatinine, Ser 0.86 0.57 - 1.00 mg/dL   GFR calc non Af Amer 64 >59 mL/min/1.73   GFR calc Af Amer 74 >59 mL/min/1.73   BUN/Creatinine Ratio 24 12 - 28   Sodium 132 (L) 134 - 144 mmol/L   Potassium 4.8 3.5 - 5.2 mmol/L   Chloride 96 96 - 106 mmol/L   CO2 26 20 - 29 mmol/L   Calcium 9.0 8.7 - 10.3 mg/dL   Total Protein  7.2 6.0 - 8.5 g/dL   Albumin 4.1 3.7 - 4.7 g/dL   Globulin, Total 3.1 1.5 - 4.5 g/dL   Albumin/Globulin Ratio 1.3 1.2 - 2.2   Bilirubin Total 0.5 0.0 - 1.2 mg/dL   Alkaline Phosphatase 77 44 - 121 IU/L   AST 22 0 - 40 IU/L   ALT 17 0 - 32 IU/L  CBC with Differential/Platelet  Result Value Ref Range   WBC 8.2 3.4 - 10.8 x10E3/uL   RBC 3.94 3.77 - 5.28 x10E6/uL   Hemoglobin 12.4 11.1 - 15.9 g/dL   Hematocrit 36.7 34.0 - 46.6 %   MCV 93 79 - 97 fL   MCH 31.5 26.6 - 33.0 pg   MCHC 33.8 31.5 - 35.7 g/dL   RDW 11.2 (L) 11.7 - 15.4 %   Platelets 343 150 - 450 x10E3/uL   Neutrophils 43 Not Estab. %   Lymphs 43 Not Estab. %   Monocytes 11 Not Estab. %   Eos 2 Not Estab. %   Basos 1 Not Estab. %   Neutrophils Absolute 3.5 1.4 - 7.0 x10E3/uL   Lymphocytes Absolute 3.5 (H) 0.7 - 3.1 x10E3/uL   Monocytes Absolute 0.9 0.1 - 0.9 x10E3/uL   EOS (ABSOLUTE) 0.1 0.0 - 0.4 x10E3/uL   Basophils Absolute 0.1 0.0 - 0.2 x10E3/uL   Immature Granulocytes 0 Not Estab. %   Immature Grans (Abs) 0.0 0.0 - 0.1 x10E3/uL     Assessment and Plan: 1. Fatigue, unspecified type Of questionable etiology.  She is eating and sleeping well.  We will check blood count and thyroid level - CBC; Future - TSH+T4F+T3Free; Future  2. Seasonal allergic rhinitis due to pollen Discussed referring her back to an allergist which she is agreeable to do.  In the meantime I recommend trying an allergy nasal spray like Flonase or Nasacort.  However patient states she has been on steroid nasal spray in the past and did not find it helpful. - Ambulatory referral to Allergy  3. Acquired hypothyroidism Continue Synthroid - TSH+T4F+T3Free; Future  4. Dysuria She will come to the lab tomorrow to give a urine sample - Urinalysis, Routine w reflex microscopic; Future  5. Muscle cramp, nocturnal Check electrolyte levels.  She is not on statin therapy.  She wonders whether it is the Cozaar.  However I have looked it up and cramps  are not a side effect of the medication.  Given her age I am a bit reluctant to put her on muscle relaxant as it can cause drowsiness and increased risk for falls.  Patient lives alone.  I recommend drinking a little bit of tonic water about  twice a week to see if it would help decrease the cramps. - Basic Metabolic Panel; Future   Follow Up Instructions: 7 wks for AWV   I discussed the assessment and treatment plan with the patient. The patient was provided an opportunity to ask questions and all were answered. The patient agreed with the plan and demonstrated an understanding of the instructions.   The patient was advised to call back or seek an in-person evaluation if the symptoms worsen or if the condition fails to improve as anticipated.  I  Spent 28 minutes on this telephone encounter  Karle Plumber, MD

## 2020-05-14 ENCOUNTER — Ambulatory Visit: Payer: Medicare Other | Attending: Internal Medicine

## 2020-05-14 ENCOUNTER — Other Ambulatory Visit: Payer: Self-pay

## 2020-05-14 DIAGNOSIS — R5383 Other fatigue: Secondary | ICD-10-CM

## 2020-05-14 DIAGNOSIS — R300 Dysuria: Secondary | ICD-10-CM

## 2020-05-14 DIAGNOSIS — R252 Cramp and spasm: Secondary | ICD-10-CM

## 2020-05-14 DIAGNOSIS — E039 Hypothyroidism, unspecified: Secondary | ICD-10-CM

## 2020-05-15 DIAGNOSIS — M5416 Radiculopathy, lumbar region: Secondary | ICD-10-CM | POA: Diagnosis not present

## 2020-05-15 LAB — BASIC METABOLIC PANEL
BUN/Creatinine Ratio: 21 (ref 12–28)
BUN: 19 mg/dL (ref 8–27)
CO2: 22 mmol/L (ref 20–29)
Calcium: 9 mg/dL (ref 8.7–10.3)
Chloride: 91 mmol/L — ABNORMAL LOW (ref 96–106)
Creatinine, Ser: 0.92 mg/dL (ref 0.57–1.00)
Glucose: 119 mg/dL — ABNORMAL HIGH (ref 65–99)
Potassium: 5.3 mmol/L — ABNORMAL HIGH (ref 3.5–5.2)
Sodium: 129 mmol/L — ABNORMAL LOW (ref 134–144)
eGFR: 63 mL/min/{1.73_m2} (ref >59)

## 2020-05-15 LAB — URINALYSIS, ROUTINE W REFLEX MICROSCOPIC
Bilirubin, UA: NEGATIVE
Glucose, UA: NEGATIVE
Ketones, UA: NEGATIVE
Leukocytes,UA: NEGATIVE
Nitrite, UA: NEGATIVE
Protein,UA: NEGATIVE
RBC, UA: NEGATIVE
Specific Gravity, UA: 1.01 (ref 1.005–1.030)
Urobilinogen, Ur: 0.2 mg/dL (ref 0.2–1.0)
pH, UA: 6 (ref 5.0–7.5)

## 2020-05-15 LAB — TSH+T4F+T3FREE
Free T4: 1.45 ng/dL (ref 0.82–1.77)
T3, Free: 2.2 pg/mL (ref 2.0–4.4)
TSH: 1.09 u[IU]/mL (ref 0.450–4.500)

## 2020-05-15 LAB — CBC
Hematocrit: 35.8 % (ref 34.0–46.6)
Hemoglobin: 11.5 g/dL (ref 11.1–15.9)
MCH: 30.1 pg (ref 26.6–33.0)
MCHC: 32.1 g/dL (ref 31.5–35.7)
MCV: 94 fL (ref 79–97)
Platelets: 380 10*3/uL (ref 150–450)
RBC: 3.82 x10E6/uL (ref 3.77–5.28)
RDW: 10.9 % — ABNORMAL LOW (ref 11.7–15.4)
WBC: 10.1 10*3/uL (ref 3.4–10.8)

## 2020-05-16 ENCOUNTER — Telehealth: Payer: Self-pay | Admitting: Internal Medicine

## 2020-05-16 DIAGNOSIS — E871 Hypo-osmolality and hyponatremia: Secondary | ICD-10-CM

## 2020-05-16 DIAGNOSIS — E875 Hyperkalemia: Secondary | ICD-10-CM

## 2020-05-16 DIAGNOSIS — D649 Anemia, unspecified: Secondary | ICD-10-CM

## 2020-05-16 MED ORDER — CIPROFLOXACIN HCL 500 MG PO TABS: 500.0000 mg | ORAL_TABLET | Freq: Two times a day (BID) | ORAL | 0 refills | Status: DC

## 2020-05-16 NOTE — Telephone Encounter (Signed)
PC placed today to discuss lab results. Na level low at 129 and K+ level mildly elev at 5.3 Hb mildly low at 11.5.  Previous was 12.4 Thyroid level nl UA negative for UTI. Patient is not on potassium supplement.  She tells me that recently she has been eating a lot of banana and has stopped the salt substitute. She denies any lower extremity edema or dyspnea on exertion. Denies any vomiting or diarrhea over the past several days. Calculated serum osmolality is in the 270s which I low.  Differential diagnosis for the low sodium include adrenal insufficiency, SIADH, hypothyroid but thyroid level is normal.  I recommend fluid restriction to about 1 L a day.  Advised to cut back on potassium rich foods.  I listed several of them for her including oranges, orange juices, banana, cantaloupe, mangoes, milk.  Return to the lab in 1 week to have electrolytes rechecked.  We will do additional studies at that time including urine sodium and serum osmolality.  I will also check iron studies given the slightly low hemoglobin level.  Patient reports that she has had increased burning on urination over the past 24 hours flank pain on one side.  She thinks she has a UTI that has developed since she did the urinalysis 2 days ago.  I told her that I will send a prescription to her pharmacy for some antibiotics.

## 2020-05-19 ENCOUNTER — Other Ambulatory Visit: Payer: Self-pay | Admitting: Oncology

## 2020-05-20 ENCOUNTER — Telehealth: Payer: Self-pay | Admitting: Oncology

## 2020-05-20 NOTE — Telephone Encounter (Signed)
Cancelled lab and made appt virtual per 4/18 sch msg. Pt is aware, but feels she may still need labs in May. Transferred call to nurse so she can discuss whether a lab is needed or not.

## 2020-05-21 ENCOUNTER — Ambulatory Visit: Payer: Medicare Other | Attending: Internal Medicine

## 2020-05-21 ENCOUNTER — Other Ambulatory Visit: Payer: Self-pay

## 2020-05-21 ENCOUNTER — Telehealth: Payer: Self-pay | Admitting: Internal Medicine

## 2020-05-21 DIAGNOSIS — D649 Anemia, unspecified: Secondary | ICD-10-CM | POA: Diagnosis not present

## 2020-05-21 DIAGNOSIS — E875 Hyperkalemia: Secondary | ICD-10-CM

## 2020-05-21 DIAGNOSIS — E871 Hypo-osmolality and hyponatremia: Secondary | ICD-10-CM

## 2020-05-21 DIAGNOSIS — D472 Monoclonal gammopathy: Secondary | ICD-10-CM | POA: Diagnosis not present

## 2020-05-21 DIAGNOSIS — M25561 Pain in right knee: Secondary | ICD-10-CM | POA: Diagnosis not present

## 2020-05-21 NOTE — Telephone Encounter (Signed)
-----  Message from Rennis Harding, RN sent at 05/20/2020 10:02 AM EDT ----- Regarding: Labs 4/20 Hi Dr. Wynetta Emery -  Sending this message on behalf of this mutual patient.  Patient is scheduled for labs at your clinic tomorrow, 4/20.    Patient requesting to see if we can add labs to this appointment that she will need prior to her follow up with Dr. Jana Hakim in May.    Labs needed/requested would be CBC with Diff, CMP, and Multiple Myeloma Panel.   If this is not possible, we are happy to bring patient in our clinic to have labs drawn.     Thanks so much for your time in reviewing.   Warmly,   Kathlee Nations, RN with Dr. Jana Hakim

## 2020-05-24 ENCOUNTER — Telehealth: Payer: Self-pay | Admitting: Internal Medicine

## 2020-05-24 DIAGNOSIS — R7989 Other specified abnormal findings of blood chemistry: Secondary | ICD-10-CM

## 2020-05-24 LAB — COMPREHENSIVE METABOLIC PANEL
ALT: 14 IU/L (ref 0–32)
AST: 13 IU/L (ref 0–40)
Albumin/Globulin Ratio: 1.3 (ref 1.2–2.2)
Albumin: 4 g/dL (ref 3.7–4.7)
Alkaline Phosphatase: 91 IU/L (ref 44–121)
BUN/Creatinine Ratio: 22 (ref 12–28)
BUN: 20 mg/dL (ref 8–27)
Bilirubin Total: 0.3 mg/dL (ref 0.0–1.2)
CO2: 23 mmol/L (ref 20–29)
Calcium: 9.1 mg/dL (ref 8.7–10.3)
Chloride: 95 mmol/L — ABNORMAL LOW (ref 96–106)
Creatinine, Ser: 0.93 mg/dL (ref 0.57–1.00)
Globulin, Total: 3 g/dL (ref 1.5–4.5)
Glucose: 108 mg/dL — ABNORMAL HIGH (ref 65–99)
Potassium: 4.8 mmol/L (ref 3.5–5.2)
Sodium: 136 mmol/L (ref 134–144)
Total Protein: 7 g/dL (ref 6.0–8.5)
eGFR: 62 mL/min/{1.73_m2} (ref >59)

## 2020-05-24 LAB — MULTIPLE MYELOMA PANEL, SERUM
Albumin SerPl Elph-Mcnc: 3.6 g/dL (ref 2.9–4.4)
Albumin/Glob SerPl: 1.1 (ref 0.7–1.7)
Alpha 1: 0.3 g/dL (ref 0.0–0.4)
Alpha2 Glob SerPl Elph-Mcnc: 0.7 g/dL (ref 0.4–1.0)
B-Globulin SerPl Elph-Mcnc: 2.1 g/dL — ABNORMAL HIGH (ref 0.7–1.3)
Gamma Glob SerPl Elph-Mcnc: 0.3 g/dL — ABNORMAL LOW (ref 0.4–1.8)
Globulin, Total: 3.4 g/dL (ref 2.2–3.9)
IgA/Immunoglobulin A, Serum: 15 mg/dL — ABNORMAL LOW (ref 64–422)
IgG (Immunoglobin G), Serum: 400 mg/dL — ABNORMAL LOW (ref 586–1602)
IgM (Immunoglobulin M), Srm: 1911 mg/dL — ABNORMAL HIGH (ref 26–217)
M Protein SerPl Elph-Mcnc: 1.4 g/dL — ABNORMAL HIGH

## 2020-05-24 LAB — OSMOLALITY: Osmolality Meas: 284 mOsmol/kg (ref 280–301)

## 2020-05-24 LAB — CORTISOL-AM, BLOOD: Cortisol - AM: 5.5 ug/dL — ABNORMAL LOW (ref 6.2–19.4)

## 2020-05-24 LAB — SODIUM, URINE, RANDOM: Sodium, Ur: 23 mmol/L

## 2020-05-24 LAB — IRON,TIBC AND FERRITIN PANEL
Ferritin: 65 ng/mL (ref 15–150)
Iron Saturation: 19 % (ref 15–55)
Iron: 57 ug/dL (ref 27–139)
Total Iron Binding Capacity: 302 ug/dL (ref 250–450)
UIBC: 245 ug/dL (ref 118–369)

## 2020-05-24 NOTE — Telephone Encounter (Signed)
PC placed to pt this afternoon to go over lab results. Pt informed that her potassium and sodium level have normalized.  Iron level is in the low normal range.  Looks like anemia of chronic disease plus or minus early iron deficiency.  Cortisol level is low.  Cortisol level was checked because patient had complained of fatigue, nausea and had low sodium/elevated potassium.  Patient states she still feels fatigue.  Advised patient that I will refer her to an endocrinologist because of the low cortisol level.  She should speak with her oncologist Dr. Jana Hakim about the iron level and whether he would recommend she takes iron supplement.  I have sent him a message letting him know to have a look at patient's lab results including the myeloma panel that he requested be added to the labs that were done.  In regards to her allergies, she states that she has an appointment with the allergist in June.  In the meantime she tried Zyrtec over-the-counter.  She states she did not tolerate it in the past but this time she is tolerating it okay and wanted to let me know that.

## 2020-05-26 ENCOUNTER — Other Ambulatory Visit: Payer: Self-pay | Admitting: Oncology

## 2020-05-26 DIAGNOSIS — D472 Monoclonal gammopathy: Secondary | ICD-10-CM

## 2020-05-26 DIAGNOSIS — M5416 Radiculopathy, lumbar region: Secondary | ICD-10-CM | POA: Diagnosis not present

## 2020-05-27 ENCOUNTER — Telehealth: Payer: Self-pay

## 2020-05-27 NOTE — Telephone Encounter (Signed)
Copied from [place] [id number]. Topic: General - Other >> May 16, 2020 12:02 PM Pawlus, Brayton Layman A wrote: Reason for CRM: Pt stated Dr Wynetta Emery was supposed to send her in an antibiotic today (practice is currently closed?) please follow up on Monday. No notes or recent prescriptions in the system. Please advise.

## 2020-05-27 NOTE — Telephone Encounter (Signed)
Contacted [facility] and spoke to [place] and per Ronalee Belts they did receive an antibiotic for Cipro pt picked up on 4/15. Contacted pt and pt states she has picked up rx and has already finished course

## 2020-05-28 ENCOUNTER — Telehealth: Payer: Self-pay | Admitting: Oncology

## 2020-05-28 NOTE — Telephone Encounter (Signed)
Scheduled appt per 4/25sch msg. Pt aware.  

## 2020-05-29 ENCOUNTER — Other Ambulatory Visit: Payer: Self-pay | Admitting: Oncology

## 2020-05-29 DIAGNOSIS — D472 Monoclonal gammopathy: Secondary | ICD-10-CM

## 2020-06-09 ENCOUNTER — Other Ambulatory Visit: Payer: Self-pay | Admitting: Cardiology

## 2020-06-10 ENCOUNTER — Ambulatory Visit: Payer: Medicare Other | Admitting: Internal Medicine

## 2020-06-16 ENCOUNTER — Other Ambulatory Visit: Payer: Medicare Other

## 2020-06-18 ENCOUNTER — Ambulatory Visit: Payer: Medicare Other | Admitting: Oncology

## 2020-06-18 NOTE — Progress Notes (Signed)
Kimberly Merritt  [phone] [phone]     ID: Kimberly Merritt DOB: 16-Jul-1940  MR#: [id number]  CBS#:[id number]  Patient Care Team: Ladell Pier, MD as PCP - General (Internal Medicine) Minus Breeding, MD as PCP - Cardiology (Cardiology) Melrose Nakayama, MD as Attending Physician (Orthopedic Surgery) Nayzeth Altman, Virgie Dad, MD (Hematology and Oncology) Clarene Essex, MD (Gastroenterology) Rutherford Guys, MD as Attending Physician (Ophthalmology) Jovita Gamma, MD as Consulting Physician (Neurosurgery) Minus Breeding, MD as Consulting Physician (Cardiology) Levy Sjogren, MD as Referring Physician (Dermatology) Irine Seal, MD as Attending Physician (Urology) Mosetta Anis, MD as Referring Physician (Allergy) Elam Dutch, MD as Consulting Physician (Vascular Surgery) Hurman Horn, MD as Consulting Physician (Ophthalmology) OTHER MD: Minus Breeding MD, Marshell Garfinkel MD  CHIEF COMPLAINT: IgM M-GUS/ low-grade lymphoproliferative process  CURRENT TREATMENT: IVIG as needed; McLain returns today for follow-up of Kimberly Merritt low-grade myeloproliferative disorder.   We are following Kimberly Merritt M-protein and Kimberly Merritt total Merritt, with today's results pending  Results for Kimberly Merritt (MRN [id number]) as of 06/18/2019 11:30  Ref. Merritt 11/24/2017 10:44 05/31/2018 10:43 11/30/2018 10:32 03/15/2019 10:58 06/12/2019 11:05  IgM (Immunoglobulin M), Srm Latest Ref Merritt: 26 - 217 mg/dL 1,432 (H) 1,754 (H) 1,598 (H) 1,712 (H) 1,695 (H)  Results for Kimberly Merritt (MRN [id number]) as of 06/18/2019 11:30  Ref. Merritt 11/24/2017 10:44 05/31/2018 10:43 11/30/2018 10:32 03/15/2019 10:58 06/12/2019 11:05  Merritt (Immunoglobin G), Serum Latest Ref Merritt: 586 - 1,602 mg/dL 372 (L) 494 (L) 341 (L) 623 415 (L)     REVIEW OF SYSTEMS: Kimberly Merritt did okay through the winter.  Kimberly Merritt did have a fall in January where Kimberly Merritt tripped over some 2 by  fours.  Kimberly Merritt had a viral episode as well.  Kimberly Merritt has had no fevers, no drenching sweats, no adenopathy, and no bleeding.  Kimberly Merritt does feel very fatigued, more than usual.  Kimberly Merritt is able to do all Kimberly Merritt house work and also does some gardening.  A detailed review of systems was otherwise stable   COVID 19 VACCINATION STATUS: fully vaccinated [employer]), with booster 10/2019   HISTORY PRESENT ILLNESS::  from the earlier summary note:  "Kimberly Merritt" Merritt is a 80 y/o [place] woman I evaluated originally in October of 2004 for a monoclonal gammopathy. At that time Kimberly Merritt had a white cell count of 7.2, hemoglobin 12.8, MCV 94.5, and platelets 298,000. Protein workup showed an IgM kappa paraprotein of 0.6 g, with a total Merritt slightly depressed at 556, but a normal IgA at 82. Because an IgM paraprotein can be associated with a low grade lymphoproliferative process (Waldenstrm's macroglobulinemia) Kimberly Merritt underwent bone marrow biopsy 11/27/2002, which showed ([id number]) a normal cellular marrow with trilineage hematopoiesis and 5% plasma cells. The peripheral blood film showed no significant abnormalities and on the biopsy para-spicular lymphoid aggregates were not noted. 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  The patient was followed with observation alone through May of 2009, with no evidence of progression in Kimberly Merritt IgM kappa clone. As of 06/03/2007 Kimberly Merritt total IgM spike was 0.83, Kimberly Merritt Merritt was 490 and Kimberly Merritt IgA was 36. The patient was released from followup here at that time.  More recently, as the 10 year anniversary of Kimberly Merritt original bone marrow biopsy past, Dr. Dorann Lodge of referred the patient back for reassessment and possible bone marrow rebiopsy. Kimberly Merritt was scheduled here for lab work 09/25/2013. However when Kimberly Merritt had Kimberly Merritt  blood drawn Kimberly Merritt complained of some shortness of breath and Kimberly Merritt pulse was checked. It was 33. The patient was referred to the emergency room where Kimberly Merritt was evaluated  by Dr. Percival Spanish who felt the patient's true heart rate was higher (Kimberly Merritt has ectopic beats which generated a week pulse and may have been missed) since in the emergency room EKG Kimberly Merritt rate was 98 with normal sinus rhythm. Troponins were negative. BNP was slightly elevated.  Dr. Percival Spanish set the patient up for an echocardiogram 09/27/2013 and this showed an ejection fraction of 30-35%. There was diffuse hypokinesis. Kimberly Merritt underwent right and left heart catheterization 10/04/2013 which showed no significant obstructions. The study did confirm an ejection fraction of roughly 35% with global hypokinesis. Kimberly Merritt is followed by Dr. Percival Spanish for Kimberly Merritt new diagnosis of non-ischemic cardiomyopathy.  Kimberly Merritt subsequent history is as detailed below   PAST MEDICAL HISTORY: Past Medical History:  Diagnosis Date  . Allergy   . CAD (coronary artery disease)    Nonobstructive. LAD 30% stenosis, small PDA of the circumflex 70% stenosis, right coronary artery 40% stenosis. Catheterization September 2015  . DEPRESSION 08/29/2006  . Diverticulosis   . GERD 08/29/2006  . HEPATIC CYST 08/02/2007  . Hypertension   . HYPOTHYROIDISM 08/29/2006  . LIVER HEMANGIOMA 03/03/2010  . LOW BACK PAIN 08/29/2006  . 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 MUSCLE SPASM 03/03/2010  . Nonischemic cardiomyopathy (HCC)    EF 30%  . OSTEOARTHRITIS 08/29/2006  . OSTEOPENIA 11/19/2008  . PARESTHESIA 03/05/2009  . PVC's (premature ventricular contractions)   . Scoliosis   . VARICOSE VEINS, LOWER EXTREMITIES 08/29/2006    PAST SURGICAL HISTORY: Past Surgical History:  Procedure Laterality Date  . ABDOMINAL HYSTERECTOMY  1981  . APPENDECTOMY    . BACK SURGERY  2010,2006   lumb fusion  . BUNIONECTOMY     both  . BUNIONECTOMY Right 1985  . CARPAL TUNNEL RELEASE     both  . CARPAL TUNNEL RELEASE Right 1992  . CARPAL TUNNEL RELEASE Left 1992  . CATARACT EXTRACTION     both  . CATARACT EXTRACTION Right 2008  . CATARACT EXTRACTION Left 2008  . HAMMER  TOE SURGERY Left 1987  . HAMMER TOE SURGERY Right 1986  . LEFT AND RIGHT HEART CATHETERIZATION WITH CORONARY ANGIOGRAM N/A 10/04/2013   Procedure: LEFT AND RIGHT HEART CATHETERIZATION WITH CORONARY ANGIOGRAM;  Surgeon: Leonie Man, MD;  Location: [facility] CATH LAB;  Service: Cardiovascular;  Laterality: N/A;  . LUMBAR FUSION  2006  . LUMBAR FUSION  2010  . OOPHORECTOMY  1967  . OVARIAN CYST REMOVAL    . ROTATOR CUFF REPAIR Right 2013  . SHOULDER ARTHROSCOPY  02/09/2011   Procedure: ARTHROSCOPY SHOULDER;  Surgeon: Hessie Dibble, MD;  Location: [place];  Service: Orthopedics;  Laterality: Right;  right shoulder arthroscopy subacromial decompression with rotator cuff repair  . TONSILLECTOMY      FAMILY HISTORY Family History  Problem Relation Age of Onset  . Stroke Mother   . Cancer Father   . Emphysema Father   . Hypertension Other   . Breast cancer Neg Hx    the patient's father died at the age of 40 from lung cancer in the setting of tobacco abuse. [name] mother died at the age of 33 following a stroke. The patient had one brother, who is severely retarded and died from pneumonia at the age of 72. There were no sisters. There is no history of blood problems or  cancer in the family to the patient's knowledge   GYNECOLOGIC HISTORY:  No LMP recorded. Patient is postmenopausal. Menarche age 48. The patient is GX P0. Kimberly Merritt stopped having periods in 04-15-1978 and took hormone replacement for almost 20 years.   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 Kimberly Merritt husband died in 1991/04/15 from a myocardial infarction.  Kimberly Merritt tells me Kimberly Merritt has no family.  Kimberly Merritt lives by herself, in [place], with one cat for company.    ADVANCED DIRECTIVES: Kimberly Merritt healthcare power of attorney is Charm Barges who can be reached at [phone] (cell), or [phone] (home).  Also if Baldo Ash had an acute problem Kimberly Merritt would want [place] to contact Kimberly Merritt neighbor and third  cousin Andrey Cota at [place]: Social History   Tobacco Use  . Smoking status: Never Smoker  . Smokeless tobacco: Never Used  Vaping Use  . Vaping Use: Never used  Substance Use Topics  . Alcohol use: No    Alcohol/week: 0.0 standard drinks  . Drug use: No     Colonoscopy: Apr 14, 2012  PAP: 04/15/2011  Bone density: 11/2018, -2.4  Lipid panel:  Allergies  Allergen Reactions  . Acetaminophen Rash    REACTION: high dose - tremor Per pt - rash on chest and heart palpitations  . Alendronate Sodium     Stomach upset.   . Codeine     May interfere with heart condition    . Diflunisal     Stomach pain.    . Doxycycline     n/v  . Flexeril [Cyclobenzaprine]     Abdominal pain.    . Iodinated Diagnostic Agents Other (See Comments)    Syncope.   . Lisinopril Other (See Comments)  . Nabumetone     Abdominal pain; dark, tarry stool.    . Other Other (See Comments)  . Oxybutynin     Pt does not remember.    . Raloxifene     Unknown.    . Sulfa Antibiotics Nausea Only    Syncope.    . Symbicort [Budesonide-Formoterol Fumarate]     "Jittery-ness."   . Tramadol Hcl     Numbness.    . Tramadol Hcl Itching    Numbness.    . Diclofenac Palpitations    Dark, tarry stool.    . Latex Rash    Current Outpatient Medications  Medication Sig Dispense Refill  . carvedilol (COREG) 12.5 MG tablet TAKE 1 TABLET BY MOUTH 2 TIMES DAILY. 180 tablet 3  . Cholecalciferol (VITAMIN D3) 50 MCG (2000 UT) capsule Take 1 capsule by mouth daily.    . ciprofloxacin (CIPRO) 500 MG tablet Take 1 tablet (500 mg total) by mouth 2 (two) times daily. 6 tablet 0  . cromolyn (NASALCROM) 5.2 MG/ACT nasal spray Place 1 spray into both nostrils 4 (four) times daily.    . fexofenadine (ALLEGRA) 180 MG tablet Take 180 mg by mouth daily.    . Glucosamine-Chondroitin 1500-1200 MG/30ML LIQD Take 1 tablet by mouth daily.    Marland Kitchen losartan (COZAAR) 25 MG tablet TAKE 1 TABLET BY MOUTH DAILY. 90 tablet 2   . pantoprazole (PROTONIX) 40 MG tablet Take 40 mg by mouth daily.    Vladimir Faster Glycol-Propyl Glycol 0.4-0.3 % SOLN Apply to eye. 2 drops in each eye 3 to 4 times daily    . SALINE NASAL SPRAY NA Place into the nose. 2 sprays each nostril every 4  hours    . sucralfate (CARAFATE) 1 g tablet Take 1 g by mouth daily as needed.     Marland Kitchen SYNTHROID 25 MCG tablet TAKE 1 TABLET BY MOUTH DAILY BEFORE BREAKFAST 90 tablet 1   No current facility-administered medications for this visit.    OBJECTIVE: white woman who appears stated age  29:   06/19/20 1236  BP: 129/66  Pulse: 70  Resp: 17  Temp: 98.1 F (36.7 C)  SpO2: 100%     Body mass index is 23.94 kg/m.    ECOG FS:2 - Symptomatic, <50% confined to bed  Sclerae unicteric, EOMs intact Wearing a mask No cervical or supraclavicular adenopathy Lungs no rales or rhonchi Heart regular rate and rhythm Abd soft, nontender, positive bowel sounds MSK no focal spinal tenderness, no upper extremity lymphedema Neuro: nonfocal, well oriented, appropriate affect Breasts: I do not palpate a mass in either breast.  Both axillae are benign.   LAB RESULTS:  CMP     Component Value Date/Time   NA 136 05/21/2020 1357   NA 136 11/04/2016 1115   K 4.8 05/21/2020 1357   K 4.8 11/04/2016 1115   CL 95 (L) 05/21/2020 1357   CO2 23 05/21/2020 1357   CO2 27 11/04/2016 1115   GLUCOSE 108 (H) 05/21/2020 1357   GLUCOSE 96 12/04/2019 1042   GLUCOSE 95 11/04/2016 1115   BUN 20 05/21/2020 1357   BUN 16.7 11/04/2016 1115   CREATININE 0.93 05/21/2020 1357   CREATININE 0.8 11/04/2016 1115   CALCIUM 9.1 05/21/2020 1357   CALCIUM 9.0 11/04/2016 1115   PROT 7.0 05/21/2020 1357   PROT 6.6 11/04/2016 1115   ALBUMIN 4.0 05/21/2020 1357   ALBUMIN 3.5 11/04/2016 1115   AST 13 05/21/2020 1357   AST 23 11/04/2016 1115   ALT 14 05/21/2020 1357   ALT 18 11/04/2016 1115   ALKPHOS 91 05/21/2020 1357   ALKPHOS 82 11/04/2016 1115   BILITOT 0.3 05/21/2020 1357    BILITOT 0.40 11/04/2016 1115   GFRNONAA 64 01/23/2020 0822   GFRNONAA >60 12/04/2019 1042   GFRNONAA 69 10/02/2013 1234   GFRAA 74 01/23/2020 0822   GFRAA 80 10/02/2013 1234    I No results found for: SPEP  Lab Results  Component Value Date   WBC 8.3 06/19/2020   NEUTROABS 3.8 06/19/2020   HGB 11.5 (L) 06/19/2020   HCT 35.2 (L) 06/19/2020   MCV 94.1 06/19/2020   PLT 312 06/19/2020      Chemistry      Component Value Date/Time   NA 136 05/21/2020 1357   NA 136 11/04/2016 1115   K 4.8 05/21/2020 1357   K 4.8 11/04/2016 1115   CL 95 (L) 05/21/2020 1357   CO2 23 05/21/2020 1357   CO2 27 11/04/2016 1115   BUN 20 05/21/2020 1357   BUN 16.7 11/04/2016 1115   CREATININE 0.93 05/21/2020 1357   CREATININE 0.8 11/04/2016 1115   GLU 89 04/08/2016 0000      Component Value Date/Time   CALCIUM 9.1 05/21/2020 1357   CALCIUM 9.0 11/04/2016 1115   ALKPHOS 91 05/21/2020 1357   ALKPHOS 82 11/04/2016 1115   AST 13 05/21/2020 1357   AST 23 11/04/2016 1115   ALT 14 05/21/2020 1357   ALT 18 11/04/2016 1115   BILITOT 0.3 05/21/2020 1357   BILITOT 0.40 11/04/2016 1115       No results found for: LABCA2  No components found for: ALPFX902  No results for input(s):  INR in the last 168 hours.  Urinalysis    Component Value Date/Time   COLORURINE LT. YELLOW 06/08/2011 0830   APPEARANCEUR Clear 05/14/2020 1519   LABSPEC 1.010 06/08/2011 0830   PHURINE 6.5 06/08/2011 0830   GLUCOSEU Negative 05/14/2020 1519   GLUCOSEU NEGATIVE 06/08/2011 0830   HGBUR NEGATIVE 06/08/2011 0830   BILIRUBINUR Negative 05/14/2020 1519   KETONESUR NEGATIVE 06/08/2011 0830   PROTEINUR Negative 05/14/2020 1519   PROTEINUR NEGATIVE 04/19/2008 1216   UROBILINOGEN 0.2 10/23/2018 1047   UROBILINOGEN 0.2 06/08/2011 0830   NITRITE Negative 05/14/2020 1519   NITRITE NEGATIVE 06/08/2011 0830   LEUKOCYTESUR Negative 05/14/2020 1519    STUDIES: No results found.   ASSESSMENT: 80 y.o. 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 When it causes amyloidosis of the heart, which is quite rare, the pattern is restrictive. 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 In case of an infection, IVIG supplementation should be considered  (a) received IVIG October 2016  (b) repeated IVIG October 2018  (c) repeated November 2019 and November 2020  (d) Evusheld May 2022   PLAN:  Kimberly Merritt is clinically very stable.  I do not have Kimberly Merritt lab work from today.  If Kimberly Merritt Merritt is very low we will proceed to in the infusion.  We discussed the fact that Kimberly Merritt is very unlikely to respond to active immunity given Kimberly Merritt B-cell problems.  I think Kimberly Merritt is a good candidate for passive immunity with a view shelled.  We discussed that and Kimberly Merritt is agreeable.  We will try to get those injections next week.  Kimberly Merritt is behind on mammography and I have entered an order for Kimberly Merritt to have Kimberly Merritt screening mammography sometime in August or September  Kimberly Merritt will have Kimberly Merritt next set of labs in October.  Once I get those results I will give Kimberly Merritt a call and likely set Kimberly Merritt up for IVIG which we usually do in the fall sometime before the beginning of winter  Kimberly Merritt knows to call for any other issue that may develop before the next visit.  Total encounter time 25 minutes.*  This patient is a 80 y.o. female that meets the FDA criteria for Emergency Use Authorization of tixagevimab/cilgavimab for pre-exposure prophylaxis of COVID-19 disease. Pt meets following criteria:  Age >12 yr and weight > 40kg  Not currently infected with SARS-CoV-2 and has no known recent exposure to an individual infected with SARS-CoV-2 AND o Who has moderate to severe immune  compromise due to a medical condition or receipt of immunosuppressive medications or treatments and may not mount an adequate immune response to COVID-19 vaccination or  o Vaccination with any available COVID-19 vaccine, according to the approved or authorized schedule, is not recommended due to a history of severe adverse reaction (e.g., severe allergic reaction) to a COVID-19 vaccine(s) and/or COVID-19 vaccine component(s).  o Patient meets the following definition of mod-severe immune compromised status: 2. Received B-cell depleting therapies within the last 6 months and age < 73yr and 6. Other actively treated hematologic malignancies or severe congenital immunodeficiency syndromes  I have spoken and communicated the following to the patient or parent/caregiver regarding COVID monoclonal antibody treatment:  1. FDA has authorized the emergency  use of tixagevimab/cilgavimab for the pre-exposure prophylaxis of COVID-19 in patients with moderate-severe immunocompromised status, who meet above EUA criteria.  2. The significant known and potential risks and benefits of COVID monoclonal antibody, and the extent to which such potential risks and benefits are unknown.  3. Information on available alternative treatments and the risks and benefits of those alternatives, including clinical trials.  4. The patient or parent/caregiver has the option to accept or refuse COVID monoclonal antibody treatment.  After reviewing this information with the patient, agree to receive tixagevimab/cilgavimab  Chauncey Cruel, MD, 06/19/2020, 1:14 PM    Geryl Dohn, Virgie Dad, MD  06/19/20 1:14 PM Medical Oncology and Hematology [facility] [place], [place] [address] Tel. [phone]    Fax. [phone]    I, Wilburn Mylar, am acting as scribe for Dr. Virgie Dad. Melvern Ramone.  I, Lurline Del MD, have reviewed the above documentation for accuracy and completeness, and I agree  with the above.   *Total Encounter Time as defined by the Centers for Medicare and Medicaid Services includes, in addition to the face-to-face time of a patient visit (documented in the note above) non-face-to-face time: obtaining and reviewing outside history, ordering and reviewing medications, tests or procedures, care coordination (communications with other health care professionals or caregivers) and documentation in the medical record.

## 2020-06-19 ENCOUNTER — Telehealth: Payer: Self-pay | Admitting: Oncology

## 2020-06-19 ENCOUNTER — Inpatient Hospital Stay: Payer: Medicare Other | Attending: Oncology | Admitting: Oncology

## 2020-06-19 ENCOUNTER — Inpatient Hospital Stay: Payer: Medicare Other

## 2020-06-19 ENCOUNTER — Other Ambulatory Visit: Payer: Self-pay

## 2020-06-19 ENCOUNTER — Other Ambulatory Visit: Payer: Self-pay | Admitting: Adult Health

## 2020-06-19 VITALS — BP 129/66 | HR 70 | Temp 98.1°F | Resp 17 | Wt 130.9 lb

## 2020-06-19 DIAGNOSIS — Z298 Encounter for other specified prophylactic measures: Secondary | ICD-10-CM | POA: Diagnosis not present

## 2020-06-19 DIAGNOSIS — D472 Monoclonal gammopathy: Secondary | ICD-10-CM

## 2020-06-19 DIAGNOSIS — D801 Nonfamilial hypogammaglobulinemia: Secondary | ICD-10-CM | POA: Diagnosis not present

## 2020-06-19 DIAGNOSIS — I429 Cardiomyopathy, unspecified: Secondary | ICD-10-CM | POA: Diagnosis not present

## 2020-06-19 DIAGNOSIS — M899 Disorder of bone, unspecified: Secondary | ICD-10-CM

## 2020-06-19 LAB — CBC WITH DIFFERENTIAL/PLATELET
Abs Immature Granulocytes: 0.03 10*3/uL (ref 0.00–0.07)
Basophils Absolute: 0.1 10*3/uL (ref 0.0–0.1)
Basophils Relative: 1 %
Eosinophils Absolute: 0.2 10*3/uL (ref 0.0–0.5)
Eosinophils Relative: 2 %
HCT: 35.2 % — ABNORMAL LOW (ref 36.0–46.0)
Hemoglobin: 11.5 g/dL — ABNORMAL LOW (ref 12.0–15.0)
Immature Granulocytes: 0 %
Lymphocytes Relative: 38 %
Lymphs Abs: 3.1 10*3/uL (ref 0.7–4.0)
MCH: 30.7 pg (ref 26.0–34.0)
MCHC: 32.7 g/dL (ref 30.0–36.0)
MCV: 94.1 fL (ref 80.0–100.0)
Monocytes Absolute: 1 10*3/uL (ref 0.1–1.0)
Monocytes Relative: 13 %
Neutro Abs: 3.8 10*3/uL (ref 1.7–7.7)
Neutrophils Relative %: 46 %
Platelets: 312 10*3/uL (ref 150–400)
RBC: 3.74 MIL/uL — ABNORMAL LOW (ref 3.87–5.11)
RDW: 12.2 % (ref 11.5–15.5)
WBC: 8.3 10*3/uL (ref 4.0–10.5)
nRBC: 0 % (ref 0.0–0.2)

## 2020-06-19 LAB — RETICULOCYTES
Immature Retic Fract: 10.8 % (ref 2.3–15.9)
RBC.: 3.75 MIL/uL — ABNORMAL LOW (ref 3.87–5.11)
Retic Count, Absolute: 43.1 10*3/uL (ref 19.0–186.0)
Retic Ct Pct: 1.2 % (ref 0.4–3.1)

## 2020-06-19 LAB — SEDIMENTATION RATE: Sed Rate: 57 mm/h — ABNORMAL HIGH (ref 0–22)

## 2020-06-19 LAB — SAVE SMEAR(SSMR), FOR PROVIDER SLIDE REVIEW

## 2020-06-19 NOTE — Telephone Encounter (Signed)
Scheduled per 5/19 los. Called pt and left a msg

## 2020-06-19 NOTE — Telephone Encounter (Signed)
Scheduled appointment per 05/19 los. Patient is aware. 

## 2020-06-20 LAB — ANTINUCLEAR ANTIBODIES, IFA: ANA Ab, IFA: NEGATIVE

## 2020-06-20 LAB — KAPPA/LAMBDA LIGHT CHAINS
Kappa free light chain: 18 mg/L (ref 3.3–19.4)
Kappa, lambda light chain ratio: 4.19 — ABNORMAL HIGH (ref 0.26–1.65)
Lambda free light chains: 4.3 mg/L — ABNORMAL LOW (ref 5.7–26.3)

## 2020-06-23 ENCOUNTER — Ambulatory Visit: Payer: Medicare Other

## 2020-06-26 ENCOUNTER — Inpatient Hospital Stay: Payer: Medicare Other

## 2020-06-26 ENCOUNTER — Other Ambulatory Visit: Payer: Self-pay | Admitting: Oncology

## 2020-06-26 ENCOUNTER — Other Ambulatory Visit: Payer: Self-pay

## 2020-06-26 DIAGNOSIS — I429 Cardiomyopathy, unspecified: Secondary | ICD-10-CM | POA: Diagnosis not present

## 2020-06-26 DIAGNOSIS — Z298 Encounter for other specified prophylactic measures: Secondary | ICD-10-CM | POA: Diagnosis not present

## 2020-06-26 DIAGNOSIS — D472 Monoclonal gammopathy: Secondary | ICD-10-CM

## 2020-06-26 DIAGNOSIS — D801 Nonfamilial hypogammaglobulinemia: Secondary | ICD-10-CM | POA: Diagnosis not present

## 2020-06-26 MED ORDER — CILGAVIMAB (PART OF EVUSHELD) INJECTION
300.0000 mg | Freq: Once | INTRAMUSCULAR | Status: AC
  Administered 2020-06-26: 300 mg via INTRAMUSCULAR
  Filled 2020-06-26: qty 3

## 2020-06-26 MED ORDER — TIXAGEVIMAB (PART OF EVUSHELD) INJECTION
300.0000 mg | Freq: Once | INTRAMUSCULAR | Status: AC
  Administered 2020-06-26: 300 mg via INTRAMUSCULAR
  Filled 2020-06-26: qty 3

## 2020-06-26 NOTE — Progress Notes (Signed)
Pt monitored 1 hour post Evusheild administration, pt tolerated tx well. VSS and pt discharged home.

## 2020-07-08 ENCOUNTER — Ambulatory Visit: Payer: Self-pay | Admitting: Allergy & Immunology

## 2020-07-17 DIAGNOSIS — K219 Gastro-esophageal reflux disease without esophagitis: Secondary | ICD-10-CM | POA: Diagnosis not present

## 2020-07-17 DIAGNOSIS — K5730 Diverticulosis of large intestine without perforation or abscess without bleeding: Secondary | ICD-10-CM | POA: Diagnosis not present

## 2020-07-22 ENCOUNTER — Other Ambulatory Visit: Payer: Self-pay

## 2020-07-22 ENCOUNTER — Encounter: Payer: Self-pay | Admitting: Internal Medicine

## 2020-07-22 ENCOUNTER — Ambulatory Visit: Payer: Medicare Other | Attending: Internal Medicine | Admitting: Internal Medicine

## 2020-07-22 VITALS — BP 137/74 | HR 62 | Resp 16 | Ht 64.0 in | Wt 130.4 lb

## 2020-07-22 DIAGNOSIS — N3946 Mixed incontinence: Secondary | ICD-10-CM

## 2020-07-22 DIAGNOSIS — Z0000 Encounter for general adult medical examination without abnormal findings: Secondary | ICD-10-CM

## 2020-07-22 DIAGNOSIS — M8580 Other specified disorders of bone density and structure, unspecified site: Secondary | ICD-10-CM | POA: Diagnosis not present

## 2020-07-22 DIAGNOSIS — Z780 Asymptomatic menopausal state: Secondary | ICD-10-CM | POA: Diagnosis not present

## 2020-07-22 DIAGNOSIS — G3184 Mild cognitive impairment, so stated: Secondary | ICD-10-CM | POA: Diagnosis not present

## 2020-07-22 DIAGNOSIS — Z7189 Other specified counseling: Secondary | ICD-10-CM

## 2020-07-22 DIAGNOSIS — Z5320 Procedure and treatment not carried out because of patient's decision for unspecified reasons: Secondary | ICD-10-CM | POA: Diagnosis not present

## 2020-07-22 NOTE — Patient Instructions (Signed)
Please consider giving [place] a copy of your medical advance directive or giving her a copy to your oncologist.  To help maintain healthy memory, I recommend healthy eating habits, regular exercise and getting an adequate sleep at night.  Other things that can help is trying to maintain close relationships with family and friends, playing word games, listening to interesting lectures or podcast.

## 2020-07-22 NOTE — Progress Notes (Signed)
Subjective:   Kimberly Merritt is a 80 y.o. female who presents for Medicare Annual (Subsequent) preventive examination. Patient with history of HTN, ICM, nonobstructive CAD (cath 2015), PVC, congenital TR, acquired hypothyroidism, Ig M MGUS followed by Dr. Jana Hakim, prediabetes, age-related macular degeneration, chronic venous insufficiency  Review of Systems           Objective:    Today's Vitals   07/22/20 1032  BP: 137/74  Pulse: 62  Resp: 16  SpO2: 97%  Weight: 130 lb 6.4 oz (59.1 kg)  Height: 5\' 4"  (1.626 m)   Body mass index is 22.38 kg/m.  General: Pleasant elderly female in NAD.  She is well kept, clothing clean Psych: Patient is oriented x3.  She answers questions appropriately. Ears: Both ear canal and tympanic membranes within normal limits. Chest: Clear bilaterally on auscultation. CVS: Regular rate and rhythm. Ext: No lower extremity edema. Advanced Directives 07/22/2020 12/16/2016 11/13/2015 11/12/2014 10/04/2013 09/25/2013 02/03/2011  Does Patient Have a Medical Advance Directive? No No No No No No Patient does not have advance directive  Would patient like information on creating a medical advance directive? No - Patient declined - - - No - patient declined information No - patient declined information -  Information in chart above taken by my CMA.  However, on my personal discussion with pt, pt confirms that she does indeed have a Medical Advance Directive and had a HPA.  Her HPA, who is her close friend, resigned because this person is dealing with health issues of her own. Pt wants to appoint someone else but she has no other close friend and no family in the area.  She is widowed and does not have any children.  No nieces or nephews.   Current Medications (verified) Outpatient Encounter Medications as of 07/22/2020  Medication Sig   carvedilol (COREG) 12.5 MG tablet TAKE 1 TABLET BY MOUTH 2 TIMES DAILY.   Cholecalciferol (VITAMIN D3) 50 MCG (2000 UT) capsule  Take 1 capsule by mouth daily.   ciprofloxacin (CIPRO) 500 MG tablet Take 1 tablet (500 mg total) by mouth 2 (two) times daily. (Patient not taking: Reported on 07/22/2020)   cromolyn (NASALCROM) 5.2 MG/ACT nasal spray Place 1 spray into both nostrils 4 (four) times daily.   fexofenadine (ALLEGRA) 180 MG tablet Take 180 mg by mouth daily.   Glucosamine-Chondroitin 1500-1200 MG/30ML LIQD Take 1 tablet by mouth daily.   losartan (COZAAR) 25 MG tablet TAKE 1 TABLET BY MOUTH DAILY.   pantoprazole (PROTONIX) 40 MG tablet Take 40 mg by mouth daily.   Polyethyl Glycol-Propyl Glycol 0.4-0.3 % SOLN Apply to eye. 2 drops in each eye 3 to 4 times daily   SALINE NASAL SPRAY NA Place into the nose. 2 sprays each nostril every 4 hours   sucralfate (CARAFATE) 1 g tablet Take 1 g by mouth daily as needed.    SYNTHROID 25 MCG tablet TAKE 1 TABLET BY MOUTH DAILY BEFORE BREAKFAST   No facility-administered encounter medications on file as of 07/22/2020.    Allergies (verified) Acetaminophen, Alendronate sodium, Codeine, Diflunisal, Doxycycline, Flexeril [cyclobenzaprine], Iodinated diagnostic agents, Lisinopril, Nabumetone, Other, Oxybutynin, Raloxifene, Sulfa antibiotics, Symbicort [budesonide-formoterol fumarate], Tramadol hcl, Tramadol hcl, Diclofenac, and Latex   History: Past Medical History:  Diagnosis Date   Allergy    CAD (coronary artery disease)    Nonobstructive. LAD 30% stenosis, small PDA of the circumflex 70% stenosis, right coronary artery 40% stenosis. Catheterization September 2015   DEPRESSION 08/29/2006   Diverticulosis  GERD 08/29/2006   HEPATIC CYST 08/02/2007   Hypertension    HYPOTHYROIDISM 08/29/2006   LIVER HEMANGIOMA 03/03/2010   LOW BACK PAIN 08/29/2006   MONOCLONAL GAMMOPATHY 03/03/2010   MUSCLE SPASM 03/03/2010   Nonischemic cardiomyopathy ([place])    EF 30%   OSTEOARTHRITIS 08/29/2006   OSTEOPENIA 11/19/2008   PARESTHESIA 03/05/2009   PVC's (premature ventricular contractions)     Scoliosis    VARICOSE VEINS, LOWER EXTREMITIES 08/29/2006   Past Surgical History:  Procedure Laterality Date   ABDOMINAL HYSTERECTOMY  1981   APPENDECTOMY     BACK SURGERY  2010,2006   lumb fusion   BUNIONECTOMY     both   BUNIONECTOMY Right 1985   CARPAL TUNNEL RELEASE     both   CARPAL TUNNEL RELEASE Right 1992   CARPAL TUNNEL RELEASE Left 1992   CATARACT EXTRACTION     both   CATARACT EXTRACTION Right 2008   CATARACT EXTRACTION Left 2008   HAMMER TOE SURGERY Left 1987   HAMMER TOE SURGERY Right 1986   LEFT AND RIGHT HEART CATHETERIZATION WITH CORONARY ANGIOGRAM N/A 10/04/2013   Procedure: LEFT AND RIGHT HEART CATHETERIZATION WITH CORONARY ANGIOGRAM;  Surgeon: Leonie Man, MD;  Location: [facility] C[facility] LAB;  Service: Cardiovascular;  Laterality: N/A;   LUMBAR FUSION  2006   LUMBAR FUSION  2010   OOPHORECTOMY  1967   OVARIAN CYST REMOVAL     ROTATOR CUFF REPAIR Right 2013   SHOULDER ARTHROSCOPY  02/09/2011   Procedure: ARTHROSCOPY SHOULDER;  Surgeon: Hessie Dibble, MD;  Location: [place];  Service: Orthopedics;  Laterality: Right;  right shoulder arthroscopy subacromial decompression with rotator cuff repair   TONSILLECTOMY     Family History  Problem Relation Age of Onset   Stroke Mother    Cancer Father    Emphysema Father    Hypertension Other    Breast cancer Neg Hx    Social History   Socioeconomic History   Marital status: Widowed    Spouse name: Not on file   Number of children: 0   Years of education: Not on file   Highest education level: Not on file  Occupational History   Occupation: Retired  Tobacco Use   Smoking status: Never   Smokeless tobacco: Never  Vaping Use   Vaping Use: Never used  Substance and Sexual Activity   Alcohol use: No    Alcohol/week: 0.0 standard drinks   Drug use: No   Sexual activity: Not Currently  Other Topics Concern   Not on file  Social History Narrative   Lives alone.     Social Determinants of  Health   Financial Resource Strain: Not on file  Food Insecurity: Not on file  Transportation Needs: Not on file  Physical Activity: Not on file  Stress: Not on file  Social Connections: Not on file    Habits:  Pt does not smoke, drink ETOH beverages.  No street drug use.  Clinical Intake: Pain : No/denies pain   Diabetes: No  Activities of Daily Living In your present state of health, do you have any difficulty performing the following activities: 07/22/2020 01/16/2020  Hearing? N N  Vision? N N  Difficulty concentrating or making decisions? N N  Walking or climbing stairs? N N  Dressing or bathing? N N  Doing errands, shopping? N N  Preparing Food and eating ? N -  Using the Toilet? N -  In the past six months, have  you accidently leaked urine? Y -  Do you have problems with loss of bowel control? N -  Managing your Medications? N -  Managing your Finances? N -  Housekeeping or managing your Housekeeping? N -  Some recent data might be hidden  Occasional leakage of urine that started a few yrs after having back surgery in 2010.  Not able to hold urine until she gets to bathroom. Also wakes at nights sometimes with a wet pad.  Sometimes leakage with coughing and laughing Wears incontinence pads.   Patient Care Team: Ladell Pier, MD as PCP - General (Internal Medicine) Minus Breeding, MD as PCP - Cardiology (Cardiology) Melrose Nakayama, MD as Attending Physician (Orthopedic Surgery) Magrinat, Virgie Dad, MD (Hematology and Oncology) Clarene Essex, MD (Gastroenterology) Rutherford Guys, MD as Attending Physician (Ophthalmology) Jovita Gamma, MD as Consulting Physician (Neurosurgery) Minus Breeding, MD as Consulting Physician (Cardiology) Levy Sjogren, MD as Referring Physician (Dermatology) Irine Seal, MD as Attending Physician (Urology) Mosetta Anis, MD as Referring Physician (Allergy) Elam Dutch, MD as Consulting Physician (Vascular  Surgery) Hurman Horn, MD as Consulting Physician (Ophthalmology)  Indicate any recent Medical Services you may have received from other than [facility] providers in the past year (date may be approximate).     Assessment:   This is a routine wellness examination for [place].  Hearing/Vision screen Whisper test:  whisper test normal Vision Screening   Right eye Left eye Both eyes  Without correction 20 50 20 50 20 30  With correction       Dietary issues and exercise activities discussed: -garden for exercise.  [place]  She does some walking there. -eating better compared to several wks ago.  Was having some issues with GERD.  Her oncologist recommended probiotics and Protonix.  Doing better with this combo.    Goals Addressed   None   Depression Screen PHQ 2/9 Scores 07/22/2020 02/08/2020 01/16/2020 06/20/2019 02/20/2019 12/07/2018 10/23/2018  PHQ - 2 Score 1 0 1 0 0 0 0  PHQ- 9 Score - - 2 2 1 1  0    Fall Risk Fall Risk  07/22/2020 05/13/2020 02/08/2020 01/16/2020 12/07/2018  Falls in the past year? 1 0 0 0 0  Number falls in past yr: 0 0 0 - 0  Injury with Fall? 0 0 0 - 0  Risk for fall due to : No Fall Risks - - - -  Follow up - - - Falls evaluation completed Falls evaluation completed  [place] 02/2020 hitting head. Stepped up on 4x4 to go into her shed.  When leaving the shed, she step down backwards, lose balance and fell backward.  No falls since then.  FALL RISK PREVENTION PERTAINING TO THE HOME:  Any stairs in or around the home? No  If so, are there any without handrails?  NA Home free of loose throw rugs in walkways, pet beds, electrical cords, etc? Yes  Adequate lighting in your home to reduce risk of falls? Yes   ASSISTIVE DEVICES UTILIZED TO PREVENT FALLS:  Life alert? No .  She does not feel she need it. Use of a cane, walker or w/c? No  Grab bars in the bathroom? No .  Does not feel she needs it. Shower chair or bench in shower? Yes   Elevated toilet seat or a handicapped toilet? No   TIMED UP AND GO:  Was the test performed? Yes .  Length of time  to ambulate 10 feet: 10 sec.   Gait slow and steady without use of assistive device  Cognitive Function: MMSE - Mini Mental State Exam 07/22/2020  Orientation to time 5  Orientation to Place 5  Registration 3  Attention/ Calculation 0  Recall 3  Language- name 2 objects 2  Language- repeat 1  Language- follow 3 step command 3  Language- read & follow direction 1  Write a sentence 1  Copy design 0  Total score 24  Admits that her memory is not what it was 10 yrs ago.  Feels it got worse with isolation caused by COVID pandemic.  Also because of her weakened immune system, she avoids crowds.  Not involved in any civic groups or church groups. Inquires what she can do to help prevent memory decline.   6CIT Screen 05/30/2018  What Year? 0 points  What month? 0 points  What time? 0 points  Count back from 20 0 points  Months in reverse 0 points  Repeat phrase 4 points  Total Score 4    Immunizations Immunization History  Administered Date(s) Administered   H1N1 02/13/2008   Influenza Split 11/23/2011, 10/30/2013, 10/09/2014   Influenza Whole 02/02/2005, 11/10/2007, 11/27/2008, 12/01/2009   Influenza, High Dose Seasonal PF 10/09/2014, 11/16/2017, 11/09/2018   Influenza-Unspecified 11/01/2012, 10/30/2013, 11/09/2018, 10/18/2019   PFIZER(Purple Top)SARS-COV-2 Vaccination 03/09/2019, 04/03/2019, 10/30/2019   Pneumococcal Conjugate-13 04/18/2013   Pneumococcal Polysaccharide-23 12/02/2005   Td 12/08/2004   Tdap 04/12/2017    TDAP status: Up to date  Flu Vaccine status: Up to date  Pneumococcal vaccine status: Up to date  Covid-19 vaccine status: Completed vaccines  Qualifies for Shingles Vaccine? Yes   Zostavax completed No   Shingrix Completed?: No.    Education has been provided regarding the importance of this vaccine. Patient has been advised to call  insurance company to determine out of pocket expense if they have not yet received this vaccine. Advised may also receive vaccine at local pharmacy or Health Dept. Verbalized acceptance and understanding.  Screening Tests Health Maintenance  Topic Date Due   Zoster Vaccines- Shingrix (1 of 2) Never done   COVID-19 Vaccine (4 - Booster for Pfizer series) 01/29/2020   INFLUENZA VACCINE  09/01/2020   TETANUS/TDAP  04/13/2027   DEXA SCAN  Completed   PNA vac Low Risk Adult  Completed   HPV VACCINES  Aged Out    Health Maintenance  Health Maintenance Due  Topic Date Due   Zoster Vaccines- Shingrix (1 of 2) Never done   COVID-19 Vaccine (4 - Booster for Pfizer series) 01/29/2020    Colorectal cancer screening: No longer required.   Mammogram status: No longer required due to however her cancer doctor ordered one..  Will have it done 09/2020  Bone Density status: Completed 11/2018. Results reflect: Bone density results: OSTEOPENIA. Repeat every 5 years.  Lung Cancer Screening: (Low Dose CT Chest recommended if Age 20-80 years, 30 pack-year currently smoking OR have quit w/in 15years.) does not qualify.   Lung Cancer Screening Referral: NA  Additional Screening:  Hepatitis C Screening: does qualify; Completed No.  Pt declined  Vision Screening: Recommended annual ophthalmology exams for early detection of glaucoma and other disorders of the eye. Is the patient up to date with their annual eye exam?  Yes  Who is the provider or what is the name of the office in which the patient attends annual eye exams? See list of providers above If pt is not established with  a provider, would they like to be referred to a provider to establish care?  NA .   Dental Screening: Recommended annual dental exams for proper oral hygiene.  Has not had dental visit since [place] pandemic.  Her previous dentist retired.  Community Resource Referral / Chronic Care Management: CRR required this visit?  No    CCM required this visit?  No      Plan:    1. Encounter for Medicare annual wellness exam   2. Advance directive discussed with patient Advised and encourage patient to provide a copy of her medical advance directives to [place] or to her oncologist so that a copy would be on her record.  Since she does not have any relatives in the area, should she be hospitalized it would be important to have her medical advance directive in the chart to know what her wishes would be in terms of end-of-life care.  3. Mild cognitive impairment -She reports mild issues with memory but she is still quite functional and independent in her ADLs.  Discussed the importance of healthy eating habits, regular exercise, adequate sleep and helping to maintain good mental health.  Suggested things like listening to music, doing word puzzles and talking with friends and family on the phone.  4. Mixed stress and urge urinary incontinence Does not want any med at this time.  Will continue using pads.  Discussed bladder retraining methods including timed voiding at nights.  5. Screening for hepatitis C declined Patient declines.  6. Osteopenia after menopause On vitamin D. I forgot to inform her of the need to take calcium supplement also at least 600 mg twice a day.  Phone call placed to patient on 07/23/20.  I left a voicemail message informing her of the need to take calcium supplement daily also.  I have personally reviewed and noted the following in the patient's chart:   Medical and social history Use of alcohol, tobacco or illicit drugs  Current medications and supplements including opioid prescriptions.  Functional ability and status Nutritional status Physical activity Advanced directives List of other physicians Hospitalizations, surgeries, and ER visits in previous 12 months Vitals Screenings to include cognitive, depression, and falls Referrals and appointments  In addition, I have reviewed and  discussed with patient certain preventive protocols, quality metrics, and best practice recommendations. A written personalized care plan for preventive services as well as general preventive health recommendations were provided to patient.     Karle Plumber, MD   07/22/2020

## 2020-07-23 ENCOUNTER — Telehealth: Payer: Self-pay | Admitting: Internal Medicine

## 2020-07-23 DIAGNOSIS — G3184 Mild cognitive impairment, so stated: Secondary | ICD-10-CM | POA: Insufficient documentation

## 2020-07-23 DIAGNOSIS — N3946 Mixed incontinence: Secondary | ICD-10-CM | POA: Insufficient documentation

## 2020-07-23 NOTE — Telephone Encounter (Signed)
Pt had medicare wellness visit with dr Wynetta Emery yesterday and per pt Dr Alain Marion took her off of calcium due to atherosclerosis this was several years ago. Pt is also taking heart and tsh medication. The patient bone density shown osteopenia while she was in her 89 and per pt no major changes. Please advise

## 2020-07-23 NOTE — Telephone Encounter (Signed)
Will forward to provider  

## 2020-07-24 NOTE — Telephone Encounter (Signed)
Returned pt call and went over provider response pt is aware and doesn't have any questions or concerns

## 2020-07-29 ENCOUNTER — Encounter: Payer: Self-pay | Admitting: Endocrinology

## 2020-07-29 ENCOUNTER — Other Ambulatory Visit: Payer: Self-pay

## 2020-07-29 ENCOUNTER — Ambulatory Visit (INDEPENDENT_AMBULATORY_CARE_PROVIDER_SITE_OTHER): Payer: Medicare Other | Admitting: Endocrinology

## 2020-07-29 DIAGNOSIS — E2740 Unspecified adrenocortical insufficiency: Secondary | ICD-10-CM

## 2020-07-29 DIAGNOSIS — R7989 Other specified abnormal findings of blood chemistry: Secondary | ICD-10-CM

## 2020-07-29 LAB — CORTISOL
Cortisol, Plasma: 8.7 ug/dL
Cortisol, Plasma: 32.5 ug/dL

## 2020-07-29 MED ORDER — COSYNTROPIN 0.25 MG IJ SOLR
0.2500 mg | Freq: Once | INTRAMUSCULAR | Status: AC
  Administered 2020-07-29: 0.25 mg via INTRAVENOUS

## 2020-07-29 NOTE — Patient Instructions (Signed)
Blood tests are requested for you today.  We'll let you know about the results.  I would be happy to see you back here as needed.

## 2020-07-29 NOTE — Progress Notes (Signed)
Subjective:    Patient ID: Kimberly Merritt, female    DOB: February 29, 1940, 80 y.o.   MRN: [id number]  HPI Pt is referred by Dr Wynetta Emery, for low cortisol level.  Pt was noted to have low cortisol level in early 2022  no h/o abdominal or brain injury.  No h/o seizures, amyloidosis, tuberculosis, or diabetes.  No h/o ketoconazole, rifampin, or dilantin.  Main symptom is fatigue Past Medical History:  Diagnosis Date   Allergy    CAD (coronary artery disease)    Nonobstructive. LAD 30% stenosis, small PDA of the circumflex 70% stenosis, right coronary artery 40% stenosis. 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/03/2010   MUSCLE SPASM 03/03/2010   Nonischemic cardiomyopathy ([place])    EF 30%   OSTEOARTHRITIS 08/29/2006   OSTEOPENIA 11/19/2008   PARESTHESIA 03/05/2009   PVC's (premature ventricular contractions)    Scoliosis    VARICOSE VEINS, LOWER EXTREMITIES 08/29/2006    Past Surgical History:  Procedure Laterality Date   ABDOMINAL HYSTERECTOMY  1981   APPENDECTOMY     BACK SURGERY  2010,2006   lumb fusion   BUNIONECTOMY     both   BUNIONECTOMY Right 1985   CARPAL TUNNEL RELEASE     both   CARPAL TUNNEL RELEASE Right 1992   CARPAL TUNNEL RELEASE Left 1992   CATARACT EXTRACTION     both   CATARACT EXTRACTION Right 2008   CATARACT EXTRACTION Left 2008   HAMMER TOE SURGERY Left 1987   HAMMER TOE SURGERY Right 1986   LEFT AND RIGHT HEART CATHETERIZATION WITH CORONARY ANGIOGRAM N/A 10/04/2013   Procedure: LEFT AND RIGHT HEART CATHETERIZATION WITH CORONARY ANGIOGRAM;  Surgeon: Leonie Man, MD;  Location: [facility] CATH LAB;  Service: Cardiovascular;  Laterality: N/A;   LUMBAR FUSION  2006   LUMBAR FUSION  2010   OOPHORECTOMY  1967   OVARIAN CYST REMOVAL     ROTATOR CUFF REPAIR Right 2013    SHOULDER ARTHROSCOPY  02/09/2011   Procedure: ARTHROSCOPY SHOULDER;  Surgeon: Hessie Dibble, MD;  Location: [place];  Service: Orthopedics;  Laterality: Right;  right shoulder arthroscopy subacromial decompression with rotator cuff repair   TONSILLECTOMY      Social History   Socioeconomic History   Marital status: Widowed    Spouse name: Not on file   Number of children: 0   Years of education: Not on file   Highest education level: Not on file  Occupational History   Occupation: Retired  Tobacco Use   Smoking status: Never   Smokeless tobacco: Never  Vaping Use   Vaping Use: Never used  Substance and Sexual Activity   Alcohol use: No    Alcohol/week: 0.0 standard drinks   Drug use: No   Sexual activity: Not Currently  Other Topics Concern   Not on file  Social History Narrative   Lives alone.     Social Determinants of Health   Financial Resource Strain: Not on file  Food Insecurity: Not on file  Transportation Needs: Not on file  Physical Activity: Not on file  Stress: Not on file  Social Connections: Not on file  Intimate Partner Violence: Not on file    Current Outpatient Medications on File Prior to Visit  Medication Sig  Dispense Refill   carvedilol (COREG) 12.5 MG tablet TAKE 1 TABLET BY MOUTH 2 TIMES DAILY. 180 tablet 3   Cholecalciferol (VITAMIN D3) 50 MCG (2000 UT) capsule Take 1 capsule by mouth daily.     cromolyn (NASALCROM) 5.2 MG/ACT nasal spray Place 1 spray into both nostrils 4 (four) times daily.     fexofenadine (ALLEGRA) 180 MG tablet Take 180 mg by mouth daily.     Glucosamine-Chondroitin 1500-1200 MG/30ML LIQD Take 1 tablet by mouth daily.     losartan (COZAAR) 25 MG tablet TAKE 1 TABLET BY MOUTH DAILY. 90 tablet 2   pantoprazole (PROTONIX) 40 MG tablet Take 40 mg by mouth daily.     Polyethyl Glycol-Propyl Glycol 0.4-0.3 % SOLN Apply to eye. 2 drops in each eye 3 to 4 times daily     SALINE NASAL SPRAY NA Place into the  nose. 2 sprays each nostril every 4 hours     sucralfate (CARAFATE) 1 g tablet Take 1 g by mouth daily as needed.      SYNTHROID 25 MCG tablet TAKE 1 TABLET BY MOUTH DAILY BEFORE BREAKFAST 90 tablet 1   No current facility-administered medications on file prior to visit.    Allergies  Allergen Reactions   Acetaminophen Rash    REACTION: high dose - tremor Per pt - rash on chest and heart palpitations   Alendronate Sodium     Stomach upset.    Codeine     May interfere with heart condition     Diflunisal     Stomach pain.     Doxycycline     n/v   Flexeril [Cyclobenzaprine]     Abdominal pain.     Iodinated Diagnostic Agents Other (See Comments)    Syncope.    Lisinopril Other (See Comments)   Nabumetone     Abdominal pain; dark, tarry stool.     Other Other (See Comments)   Oxybutynin     Pt does not remember.     Raloxifene     Unknown.     Sulfa Antibiotics Nausea Only    Syncope.     Symbicort [Budesonide-Formoterol Fumarate]     "Jittery-ness."    Tramadol Hcl     Numbness.     Tramadol Hcl Itching    Numbness.     Diclofenac Palpitations    Dark, tarry stool.     Latex Rash    Family History  Problem Relation Age of Onset   Stroke Mother    Cancer Father    Emphysema Father    Hypertension Other    Breast cancer Neg Hx     BP (!) 142/70   Pulse 65   Ht 5\' 4"  (1.626 m)   Wt 130 lb 3.2 oz (59.1 kg)   SpO2 96%   BMI 22.35 kg/m     Review of Systems Denies weight loss, dizziness, n/v, abd pain, cold intolerance, vitiligo, and change in skin tone.      Objective:   Physical Exam VS: see vs page GEN: no distress HEAD: head: no deformity eyes: no periorbital swelling, no proptosis external nose and ears are normal.  NECK: supple, thyroid is not enlarged.   CHEST WALL: no deformity LUNGS: clear to auscultation CV: reg rate and rhythm, no murmur.  MUSCULOSKELETAL: gait is normal and steady.   EXTEMITIES: (chronic) deformities of all fingers.   1+ bilat leg edema.   NEURO:  readily moves all 4's.  sensation is intact to  touch on all 4's.   SKIN:  Normal texture and temperature.  No rash or suspicious lesion is visible.  No vitiligo.   NODES:  None palpable at the neck.   PSYCH: alert, well-oriented.  Does not appear anxious nor depressed.     Lab Results  Component Value Date   HGBA1C 5.1 01/23/2020   Lab Results  Component Value Date   TSH 1.090 05/14/2020   T3TOTAL 94 10/23/2018    CT (2014) adrenal are normal  I have reviewed outside records, and summarized: Pt was noted to have low cortisol, and referred here. Last steroid injection was 3/22  ACTH stimulation test is done: baseline cortisol level=9 then Cosyntropin 250 mcg is given im 45 minutes later, cortisol level=33 (normal response)    Assessment & Plan:  Low cortisol, new to me.  Adrenal insuff is excluded.  No medication is needed Fatigue, not adrenal related.   Patient Instructions  Blood tests are requested for you today.  We'll let you know about the results.  I would be happy to see you back here as needed.

## 2020-08-01 LAB — ACTH: C206 ACTH: 8 pg/mL (ref 6–50)

## 2020-08-20 DIAGNOSIS — X32XXXD Exposure to sunlight, subsequent encounter: Secondary | ICD-10-CM | POA: Diagnosis not present

## 2020-08-20 DIAGNOSIS — D225 Melanocytic nevi of trunk: Secondary | ICD-10-CM | POA: Diagnosis not present

## 2020-08-20 DIAGNOSIS — Z1283 Encounter for screening for malignant neoplasm of skin: Secondary | ICD-10-CM | POA: Diagnosis not present

## 2020-08-20 DIAGNOSIS — Z85828 Personal history of other malignant neoplasm of skin: Secondary | ICD-10-CM | POA: Diagnosis not present

## 2020-08-20 DIAGNOSIS — Z08 Encounter for follow-up examination after completed treatment for malignant neoplasm: Secondary | ICD-10-CM | POA: Diagnosis not present

## 2020-08-20 DIAGNOSIS — L570 Actinic keratosis: Secondary | ICD-10-CM | POA: Diagnosis not present

## 2020-08-20 DIAGNOSIS — L820 Inflamed seborrheic keratosis: Secondary | ICD-10-CM | POA: Diagnosis not present

## 2020-08-21 ENCOUNTER — Other Ambulatory Visit: Payer: Self-pay | Admitting: Oncology

## 2020-08-21 DIAGNOSIS — Z1231 Encounter for screening mammogram for malignant neoplasm of breast: Secondary | ICD-10-CM

## 2020-09-17 DIAGNOSIS — M67912 Unspecified disorder of synovium and tendon, left shoulder: Secondary | ICD-10-CM | POA: Diagnosis not present

## 2020-09-17 DIAGNOSIS — M7061 Trochanteric bursitis, right hip: Secondary | ICD-10-CM | POA: Diagnosis not present

## 2020-10-07 ENCOUNTER — Encounter: Payer: Self-pay | Admitting: Allergy & Immunology

## 2020-10-07 ENCOUNTER — Other Ambulatory Visit: Payer: Self-pay

## 2020-10-07 ENCOUNTER — Ambulatory Visit: Payer: Medicare Other | Admitting: Allergy & Immunology

## 2020-10-07 VITALS — BP 134/76 | HR 64 | Temp 97.6°F | Resp 16 | Ht 64.0 in | Wt 128.5 lb

## 2020-10-07 DIAGNOSIS — K9049 Malabsorption due to intolerance, not elsewhere classified: Secondary | ICD-10-CM

## 2020-10-07 DIAGNOSIS — J310 Chronic rhinitis: Secondary | ICD-10-CM

## 2020-10-07 DIAGNOSIS — D472 Monoclonal gammopathy: Secondary | ICD-10-CM

## 2020-10-07 MED ORDER — LEVOCETIRIZINE DIHYDROCHLORIDE 5 MG PO TABS: 5.0000 mg | ORAL_TABLET | Freq: Every evening | ORAL | 5 refills | Status: DC

## 2020-10-07 MED ORDER — AZELASTINE HCL 0.1 % NA SOLN: NASAL | 5 refills | Status: DC

## 2020-10-07 NOTE — Progress Notes (Signed)
NEW PATIENT  Date of Service/Encounter:  10/07/20  Consult requested by: Ladell Pier, MD   Assessment:   Chronic rhinitis - with testing only positive to grasses  Adverse food reaction (maple syrup) - no testing done today for that   MGUS  Inadequate response to the COVID19 vaccine  Food intolerance (soy) - with negative testing in the past but clear clinical reactions  Plan/Recommendations:   1. Chronic rhinitis - Testing today showed: grasses - Copy of test results provided.  - Avoidance measures provided. - Continue with:  Nasalchrom twice daily after nasal saline rinses. - Start taking:  alternating antihistamines (we will send in Xyzal '5mg'$  daily) and Astelin (azelastine) 2 sprays per nostril 1-2 times daily as needed - You can alternate your antihistamine monthly to see if this helps to improve the efficacy.   2. Return in about 3 months (around 01/06/2021).   This note in its entirety was forwarded to the Provider who requested this consultation.  Subjective:   Kimberly Merritt is a 80 y.o. female presenting today for evaluation of  Chief Complaint  Patient presents with   Kimberly Merritt has a history of the following: Patient Active Problem List   Diagnosis Date Noted   Low serum cortisol level ([place]) 07/29/2020   Mixed stress and urge urinary incontinence 07/23/2020   Mild cognitive impairment 07/23/2020   Chronic fatigue syndrome 01/16/2020   Diaphragmatic hernia 01/16/2020   Stable angina ([place]) 01/16/2020   Intermediate stage nonexudative age-related macular degeneration of both eyes 01/09/2020   Posterior vitreous detachment of right eye 01/09/2020   Degenerative retinal drusen, both eyes 01/09/2020   Vitreous hemorrhage of left eye ([place]) 01/09/2020   Precordial pain 10/17/2019   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   Estrogen deficiency 02/20/2019   Macular degeneration of left eye 02/20/2019    Counseled about COVID-19 virus infection/  vaccine 02/20/2019   Osteopenia after menopause 12/07/2018   h/o foot Bone spur- of the calcium type 12/07/2018   Atherosclerotic cardiovascular disease 12/07/2018   Family history of abdominal aortic aneurysm (AAA)- father, elder 12/07/2018   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   Hyperlipidemia 03/09/2018   Immunocompromised patient ([place]) 12/01/2017   Chronic pain of right knee-since age 51 per patient 04/12/2017   Prediabetes 04/12/2017   Vitamin D insufficiency 04/12/2017   CAD (coronary artery disease) 04/05/2017   Hypertension 04/05/2017   Atherosclerosis of aorta ([place]) 04/05/2017   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   History of colonic polyps 04/05/2017   Scoliosis 04/05/2017   Ventricular premature depolarization 04/05/2017   Benign colonic polyp- found in 07/2011- told f/up Dr Watt Climes 5 yrs 04/05/2017   disabled status- since age 39 due to RA in [place] 04/05/2017   Environmental and seasonal allergies 04/05/2017   History of hypoglycemia 04/05/2017   Post-menopausal 04/05/2017   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[place]) 11/26/2016   PVC's (premature ventricular contractions) 05/18/2016   Chronic cough 10/23/2014   Nonischemic cardiomyopathy ([place]) 11/29/2013   Bradycardia 09/25/2013   Lip lesion 08/21/2013   Diverticulosis of colon without hemorrhage 05/02/2013   Paresthesia 06/20/2012   Well adult exam 08/31/2011   Cough 08/10/2011   Sinusitis, acute frontal 02/15/2011   Shoulder pain, right 07/09/2010   LIVER HEMANGIOMA 03/03/2010   MONOCLONAL GAMMOPATHY 03/03/2010   MUSCLE SPASM 03/03/2010   Herpes labialis 12/01/2009   Disorder of bone and cartilage 11/19/2008   HEPATIC CYST 08/02/2007   Allergic rhinitis 04/25/2007   HOARSENESS 04/25/2007  Hypothyroidism (acquired) 08/29/2006   VARICOSE VEINS, LOWER EXTREMITIES 08/29/2006   Gastroesophageal reflux disease 08/29/2006   Generalized osteoarthritis 08/29/2006   LOW BACK PAIN 08/29/2006    Pain in Soft Tissues of Limb 08/29/2006    History obtained from: chart review and patient.  Kimberly Merritt was referred by Ladell Pier, MD.     Kimberly Merritt is a 80 y.o. female presenting for an evaluation of environmental allergies .   Allergic Rhinitis Symptom History: She was having a lot of problems back in July. She was on Claritin at the time without relief. She was changed to cetirizine  and referred to [place] for an evaluation. Patient thinks that the change to the cetirizine did help some. It seemed to be delayed. It might be 2-32 hours later before it kicks in. The ragweed has been giving her a lot problems.  She was actually on allergy shots since age 19. She remembers a particular day when she was going on a walk with her mother at age 72 and she ended up blacking out waking up amongst the pines. She was on allergy shots ever since that. She stopped her shots back in March 2020. She did not feel that she was getting any major relief. They were also not following COVID19 protocols at the office.   She was in [place] from 1976 through 1996. She was on shots there as well and she was doing well with that. She was getting home injections and then she started seeing Dr. Donneta Romberg. The last time that he tested was in 2014.   Since being off of the allergy shots, she has done the Claritin with a lot of congestion prompting her to take Claritin 12 hours with decongestant. She has been on Nasalchrom 2-4 times per day.  She was vaccinated against COVID19 and she did not end up having protection against [place]. She is on Evushield which she received once. She also received IVIG once annually, although at some point she might need it more frequently. She typically gets that around the beginning of November.   Otherwise, there is no history of other atopic diseases, including asthma, food allergies, drug allergies, stinging insect allergies, eczema, urticaria, or contact dermatitis. There is no  significant infectious history. Vaccinations are up to date.    Past Medical History: Patient Active Problem List   Diagnosis Date Noted   Low serum cortisol level ([place]) 07/29/2020   Mixed stress and urge urinary incontinence 07/23/2020   Mild cognitive impairment 07/23/2020   Chronic fatigue syndrome 01/16/2020   Diaphragmatic hernia 01/16/2020   Stable angina ([place]) 01/16/2020   Intermediate stage nonexudative age-related macular degeneration of both eyes 01/09/2020   Posterior vitreous detachment of right eye 01/09/2020   Degenerative retinal drusen, both eyes 01/09/2020   Vitreous hemorrhage of left eye ([place]) 01/09/2020   Precordial pain 10/17/2019   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   Estrogen deficiency 02/20/2019   Macular degeneration of left eye 02/20/2019   Counseled about COVID-19 virus infection/  vaccine 02/20/2019   Osteopenia after menopause 12/07/2018   h/o foot Bone spur- of the calcium type 12/07/2018   Atherosclerotic cardiovascular disease 12/07/2018   Family history of abdominal aortic aneurysm (AAA)- father, elder 12/07/2018   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   Hyperlipidemia 03/09/2018   Immunocompromised patient ([place]) 12/01/2017   Chronic pain of right knee-since age 71 per patient 04/12/2017   Prediabetes 04/12/2017   Vitamin D insufficiency 04/12/2017   CAD (coronary artery disease) 04/05/2017  Hypertension 04/05/2017   Atherosclerosis of aorta ([place]) 04/05/2017   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   History of colonic polyps 04/05/2017   Scoliosis 04/05/2017   Ventricular premature depolarization 04/05/2017   Benign colonic polyp- found in 07/2011- told f/up Dr Watt Climes 5 yrs 04/05/2017   disabled status- since age 64 due to RA in [place] 04/05/2017   Environmental and seasonal allergies 04/05/2017   History of hypoglycemia 04/05/2017   Post-menopausal 04/05/2017   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[place]) 11/26/2016   PVC's (premature ventricular  contractions) 05/18/2016   Chronic cough 10/23/2014   Nonischemic cardiomyopathy ([place]) 11/29/2013   Bradycardia 09/25/2013   Lip lesion 08/21/2013   Diverticulosis of colon without hemorrhage 05/02/2013   Paresthesia 06/20/2012   Well adult exam 08/31/2011   Cough 08/10/2011   Sinusitis, acute frontal 02/15/2011   Shoulder pain, right 07/09/2010   LIVER HEMANGIOMA 03/03/2010   MONOCLONAL GAMMOPATHY 03/03/2010   MUSCLE SPASM 03/03/2010   Herpes labialis 12/01/2009   Disorder of bone and cartilage 11/19/2008   HEPATIC CYST 08/02/2007   Allergic rhinitis 04/25/2007   HOARSENESS 04/25/2007   Hypothyroidism (acquired) 08/29/2006   VARICOSE VEINS, LOWER EXTREMITIES 08/29/2006   Gastroesophageal reflux disease 08/29/2006   Generalized osteoarthritis 08/29/2006   LOW BACK PAIN 08/29/2006   Pain in Soft Tissues of Limb 08/29/2006    Medication List:  Allergies as of 10/07/2020       Reactions   Acetaminophen Rash   REACTION: high dose - tremor Per pt - rash on chest and heart palpitations   Alendronate Sodium    Stomach upset.    Codeine    May interfere with heart condition     Diflunisal    Stomach pain.     Doxycycline    n/v   Flexeril [cyclobenzaprine]    Abdominal pain.     Iodinated Diagnostic Agents Other (See Comments)   Syncope.    Lisinopril Other (See Comments)   Nabumetone    Abdominal pain; dark, tarry stool.     Other Other (See Comments)   Oxybutynin    Pt does not remember.     Raloxifene    Unknown.     Sulfa Antibiotics Nausea Only   Syncope.     Symbicort [budesonide-formoterol Fumarate]    "Jittery-ness."    Tramadol Hcl    Numbness.     Tramadol Hcl Itching   Numbness.     Diclofenac Palpitations   Dark, tarry stool.     Latex Rash        Medication List        Accurate as of October 07, 2020 11:59 PM. If you have any questions, ask your nurse or doctor.          STOP taking these medications    dexlansoprazole 60 MG  capsule Commonly known as: DEXILANT Stopped by: Valentina Shaggy, MD   fexofenadine 180 MG tablet Commonly known as: ALLEGRA Stopped by: Valentina Shaggy, MD       TAKE these medications    azelastine 0.1 % nasal spray Commonly known as: ASTELIN 2 sprays per nostril 1-2 times daily as needed Started by: Valentina Shaggy, MD   carvedilol 12.5 MG tablet Commonly known as: COREG TAKE 1 TABLET BY MOUTH 2 TIMES DAILY.   cetirizine 10 MG tablet Commonly known as: ZYRTEC Take 10 mg by mouth daily.   cromolyn 5.2 MG/ACT nasal spray Commonly known as: NASALCROM Place 1 spray into both nostrils 4 (four) times daily.  Glucosamine-Chondroitin 1500-1200 MG/30ML Liqd Take 1 tablet by mouth daily.   levocetirizine 5 MG tablet Commonly known as: XYZAL Take 1 tablet (5 mg total) by mouth every evening. Started by: Valentina Shaggy, MD   losartan 25 MG tablet Commonly known as: COZAAR TAKE 1 TABLET BY MOUTH DAILY.   pantoprazole 40 MG tablet Commonly known as: PROTONIX Take 40 mg by mouth daily.   Polyethyl Glycol-Propyl Glycol 0.4-0.3 % Soln Apply to eye. 2 drops in each eye 3 to 4 times daily   SALINE NASAL SPRAY NA Place into the nose. 2 sprays each nostril every 4 hours   sucralfate 1 g tablet Commonly known as: CARAFATE Take 1 g by mouth daily as needed.   Synthroid 25 MCG tablet Generic drug: levothyroxine TAKE 1 TABLET BY MOUTH DAILY BEFORE BREAKFAST   Vitamin D3 50 MCG (2000 UT) capsule Take 1 capsule by mouth daily.        Birth History: non-contributory  Developmental History: non-contributory  Past Surgical History: Past Surgical History:  Procedure Laterality Date   ABDOMINAL HYSTERECTOMY  1981   APPENDECTOMY     BACK SURGERY  2010,2006   lumb fusion   BUNIONECTOMY     both   BUNIONECTOMY Right 1985   CARPAL TUNNEL RELEASE     both   CARPAL TUNNEL RELEASE Right 1992   CARPAL TUNNEL RELEASE Left 1992   CATARACT EXTRACTION      both   CATARACT EXTRACTION Right 2008   CATARACT EXTRACTION Left 2008   HAMMER TOE SURGERY Left 1987   HAMMER TOE SURGERY Right 1986   LEFT AND RIGHT HEART CATHETERIZATION WITH CORONARY ANGIOGRAM N/A 10/04/2013   Procedure: LEFT AND RIGHT HEART CATHETERIZATION WITH CORONARY ANGIOGRAM;  Surgeon: Leonie Man, MD;  Location: [facility] CATH LAB;  Service: Cardiovascular;  Laterality: N/A;   LUMBAR FUSION  2006   LUMBAR FUSION  2010   OOPHORECTOMY  1967   OVARIAN CYST REMOVAL     ROTATOR CUFF REPAIR Right 2013   SHOULDER ARTHROSCOPY  02/09/2011   Procedure: ARTHROSCOPY SHOULDER;  Surgeon: Hessie Dibble, MD;  Location: [facility];  Service: Orthopedics;  Laterality: Right;  right shoulder arthroscopy subacromial decompression with rotator cuff repair   TONSILLECTOMY       Family History: Family History  Problem Relation Age of Onset   Stroke Mother    Cancer Father    Emphysema Father    Hypertension Other    Breast cancer Neg Hx      Social History: Toniyah lives at home by her self. She lives in a house that was built in 1967.  She was widowed shortly before moving to [place] in the 1990s. There is linoleum in the main living areas and hardwoods in the bedrooms.There is one cat inside of the home. There are dust mite coverings on the pillows. There are no dust mite coverings on the bedding. She is retired.  She used to work as a Materials engineer.   Review of Systems  Constitutional: Negative.  Negative for chills, fever, malaise/fatigue and weight loss.  HENT: Negative.  Negative for congestion, ear discharge, ear pain and sinus pain.   Eyes:  Negative for pain, discharge and redness.  Respiratory:  Negative for cough, sputum production, shortness of breath and wheezing.   Cardiovascular: Negative.  Negative for chest pain and palpitations.  Gastrointestinal:  Negative for abdominal pain, constipation, diarrhea, heartburn, nausea and vomiting.  Skin: Negative.   Negative  for itching and rash.  Neurological:  Negative for dizziness and headaches.  Endo/Heme/Allergies:  Negative for environmental allergies. Does not bruise/bleed easily.      Objective:   Blood pressure 134/76, pulse 64, temperature 97.6 F (36.4 C), temperature source Temporal, resp. rate 16, height '5\' 4"'$  (1.626 m), weight 128 lb 8 oz (58.3 kg), SpO2 98 %. Body mass index is 22.06 kg/m.   Physical Exam:   Physical Exam Vitals reviewed.  Constitutional:      Appearance: She is well-developed.  HENT:     Head: Normocephalic and atraumatic.     Right Ear: Tympanic membrane, ear canal and external ear normal. No drainage, swelling or tenderness. Tympanic membrane is not injected, scarred, erythematous, retracted or bulging.     Left Ear: Tympanic membrane, ear canal and external ear normal. No drainage, swelling or tenderness. Tympanic membrane is not injected, scarred, erythematous, retracted or bulging.     Nose: No nasal deformity, septal deviation, mucosal edema or rhinorrhea.     Right Turbinates: Enlarged, swollen and pale.     Left Turbinates: Enlarged, swollen and pale.     Right Sinus: No maxillary sinus tenderness or frontal sinus tenderness.     Left Sinus: No maxillary sinus tenderness or frontal sinus tenderness.     Mouth/Throat:     Mouth: Mucous membranes are not pale and not dry.     Pharynx: Uvula midline.  Eyes:     General:        Right eye: No discharge.        Left eye: No discharge.     Conjunctiva/sclera: Conjunctivae normal.     Right eye: Right conjunctiva is not injected. No chemosis.    Left eye: Left conjunctiva is not injected. No chemosis.    Pupils: Pupils are equal, round, and reactive to light.  Cardiovascular:     Rate and Rhythm: Normal rate and regular rhythm.     Heart sounds: Normal heart sounds.  Pulmonary:     Effort: Pulmonary effort is normal. No tachypnea, accessory muscle usage or respiratory distress.     Breath sounds:  Normal breath sounds. No wheezing, rhonchi or rales.     Comments: Moving air well in all lung fields.  No increased work of breathing. Chest:     Chest wall: No tenderness.  Abdominal:     Tenderness: There is no abdominal tenderness. There is no guarding or rebound.  Lymphadenopathy:     Head:     Right side of head: No submandibular, tonsillar or occipital adenopathy.     Left side of head: No submandibular, tonsillar or occipital adenopathy.     Cervical: No cervical adenopathy.  Skin:    General: Skin is warm.     Capillary Refill: Capillary refill takes less than 2 seconds.     Coloration: Skin is not pale.     Findings: No abrasion, erythema, petechiae or rash. Rash is not papular, urticarial or vesicular.     Comments: No eczematous or urticarial lesions noted.  Neurological:     Mental Status: She is alert.  Psychiatric:        Behavior: Behavior is cooperative.     Diagnostic studies:   Allergy Studies:     Airborne Adult Perc - 10/07/20 1450     Time Antigen Placed 1451    Allergen Manufacturer Lavella Hammock    Location Back    Number of Test 59    Panel 1 Select  1. Control-Buffer 50% Glycerol Negative    2. Control-Histamine 1 mg/ml 2+    3. Albumin saline Negative    4. [place] Negative    5. [place] Negative    6. Johnson Negative    7. Candor Blue Negative    8. Meadow Fescue Negative    9. Perennial Rye Negative    10. Sweet Vernal Negative    11. Timothy Negative    12. Cocklebur Negative    13. Burweed Marshelder Negative    14. Ragweed, short Negative    15. Ragweed, Giant Negative    16. Plantain,  English Negative    17. Lamb's Quarters Negative    18. Sheep Sorrell Negative    19. Rough Pigweed Negative    20. Marsh Elder, Rough Negative    21. Mugwort, Common Negative    22. Ash mix Negative    23. Birch mix Negative    24. Beech American Negative    25. Box, Elder Negative    26. Cedar, red Negative    27. Cottonwood, [place] Negative     28. Elm mix Negative    29. Hickory Negative    30. Maple mix Negative    31. [name], [place] mix Negative    32. Pecan Pollen Negative    33. Pine mix Negative    34. Sycamore Eastern Negative    35. [place], Black Pollen Negative    36. Alternaria alternata Negative    37. Cladosporium Herbarum Negative    38. Aspergillus mix Negative    39. Penicillium mix Negative    40. Bipolaris sorokiniana (Helminthosporium) Negative    41. Drechslera spicifera (Curvularia) Negative    42. Mucor plumbeus Negative    43. Fusarium moniliforme Negative    44. Aureobasidium pullulans (pullulara) Negative    45. Rhizopus oryzae Negative    46. Botrytis cinera Negative    47. Epicoccum nigrum Negative    48. Phoma betae Negative    49. Candida Albicans Negative    50. Trichophyton mentagrophytes Negative    51. Mite, D Farinae  5,000 AU/ml Negative    52. Mite, D Pteronyssinus  5,000 AU/ml Negative    53. Cat Hair 10,000 BAU/ml Negative    54.  Dog Epithelia Negative    55. Mixed Feathers Negative    56. Horse Epithelia Negative    57. Cockroach, German Negative    58. Mouse Negative    59. Tobacco Leaf Negative             Intradermal - 10/07/20 1610     Time Antigen Placed 1555    Allergen Manufacturer Lavella Hammock    Location Arm    Number of Test 15    Intradermal Select    Control Negative    Guatemala Negative    Johnson Negative    7 Grass 1+    Ragweed mix Negative    Weed mix Negative    Tree mix Negative    Mold 1 Negative    Mold 2 Negative    Mold 3 Negative    Mold 4 Negative    Cat Negative    Dog Negative    Cockroach Negative    Mite mix Negative             Allergy testing results were read and interpreted by myself, documented by clinical staff.         Salvatore Marvel, MD [facility]

## 2020-10-07 NOTE — Patient Instructions (Addendum)
1. Chronic rhinitis - Testing today showed: grasses - Copy of test results provided.  - Avoidance measures provided. - Continue with:  Nasalchrom twice daily after nasal saline rinses. - Start taking:  alternating antihistamines (we will send in Xyzal '5mg'$  daily) and Astelin (azelastine) 2 sprays per nostril 1-2 times daily as needed - You can alternate your antihistamine monthly to see if this helps to improve the efficacy.   2. Return in about 3 months (around 01/06/2021).    Please inform us of any Emergency Department visits, hospitalizations, or changes in symptoms. Call us before going to the ED for breathing or allergy symptoms since we might be able to fit you in for a sick visit. Feel free to contact us anytime with any questions, problems, or concerns.  It was a pleasure to meet you today!  Websites that have reliable patient information: 1. American Academy of Asthma, Allergy, and Immunology: www.aaaai.org 2. Food Allergy Research and Education (FARE): foodallergy.org 3. Mothers of Asthmatics: http://www.asthmacommunitynetwork.org 4. American College of Allergy, Asthma, and Immunology: www.acaai.org   COVID-19 Vaccine Information can be found at: ShippingScam.co.uk For questions related to vaccine distribution or appointments, please email [email or web address] or call [phone].   We realize that you might be concerned about having an allergic reaction to the COVID19 vaccines. To help with that concern, WE ARE OFFERING THE COVID19 VACCINES IN OUR OFFICE! Ask the front desk for dates!     "Like" [place] on Facebook and Instagram for our latest updates!      A healthy democracy works best when [employer] participate! Make sure you are registered to vote! If you have moved or changed any of your contact information, you will need to get this updated before voting!  In some cases, you MAY be able to register to vote  online: [email or web address]     Reducing Pollen Exposure  The American Academy of Allergy, Asthma and Immunology suggests the following steps to reduce your exposure to pollen during allergy seasons.    Do not hang sheets or clothing out to dry; pollen may collect on these items. Do not mow lawns or spend time around freshly cut grass; mowing stirs up pollen. Keep windows closed at night.  Keep car windows closed while driving. Minimize morning activities outdoors, a time when pollen counts are usually at their highest. Stay indoors as much as possible when pollen counts or humidity is high and on windy days when pollen tends to remain in the air longer. Use air conditioning when possible.  Many air conditioners have filters that trap the pollen spores. Use a HEPA room air filter to remove pollen form the indoor air you breathe.

## 2020-10-08 ENCOUNTER — Encounter: Payer: Self-pay | Admitting: Allergy & Immunology

## 2020-10-10 NOTE — Addendum Note (Signed)
Addended by: Larence Penning on: 10/10/2020 08:22 AM   Modules accepted: Orders

## 2020-10-13 ENCOUNTER — Other Ambulatory Visit: Payer: Self-pay | Admitting: Physician Assistant

## 2020-10-13 DIAGNOSIS — E039 Hypothyroidism, unspecified: Secondary | ICD-10-CM

## 2020-10-14 ENCOUNTER — Ambulatory Visit
Admission: RE | Admit: 2020-10-14 | Discharge: 2020-10-14 | Disposition: A | Discharge status: Home / Self Care | Payer: Medicare Other | Source: Ambulatory Visit | Attending: Oncology | Admitting: Oncology

## 2020-10-14 ENCOUNTER — Other Ambulatory Visit: Payer: Self-pay

## 2020-10-14 DIAGNOSIS — Z1231 Encounter for screening mammogram for malignant neoplasm of breast: Secondary | ICD-10-CM | POA: Diagnosis not present

## 2020-10-19 NOTE — Progress Notes (Signed)
Cardiology Office Note   Date:  10/21/2020   ID:  Kimberly Merritt, Kimberly Merritt 1940-04-04, MRN [id number]  PCP:  Ladell Pier, MD  Cardiologist:   Minus Breeding, MD   Chief Complaint  Patient presents with   Palpitations       History of Present Illness: Kimberly Merritt is a 80 y.o. female who presents  for follow up of cardiomyopathy. she has been noted to have an ejection fraction of 30%. This was brought to attention after she was found to have frequent premature ventricular contractions. Cardiac catheterization demonstrated nonobstructive and small vessel disease.   EF was 55% on echo with moderate TR in April 2018.  At the last visit she had chest pain.  However, she had a negative Lexiscan Myoview.   Her EF was well preserved on echo.    Since I last saw her she has had no acute complaints.  She did just have an injection for some joint pain.  This is limited her a little bit.  However, she is not having any new cardiovascular complaints other than rare palpitations.   Past Medical History:  Diagnosis Date   Allergy    CAD (coronary artery disease)    Nonobstructive. LAD 30% stenosis, small PDA of the circumflex 70% stenosis, right coronary artery 40% stenosis. 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/03/2010   MUSCLE SPASM 03/03/2010   Nonischemic cardiomyopathy ([place])    EF 30%   OSTEOARTHRITIS 08/29/2006   OSTEOPENIA 11/19/2008   PARESTHESIA 03/05/2009   PVC's (premature ventricular contractions)    Scoliosis    VARICOSE VEINS, LOWER EXTREMITIES 08/29/2006    Past Surgical History:  Procedure Laterality Date   ABDOMINAL HYSTERECTOMY  1981   APPENDECTOMY     BACK SURGERY  2010,2006   lumb fusion   BUNIONECTOMY     both   BUNIONECTOMY Right 1985   CARPAL  TUNNEL RELEASE     both   CARPAL TUNNEL RELEASE Right 1992   CARPAL TUNNEL RELEASE Left 1992   CATARACT EXTRACTION     both   CATARACT EXTRACTION Right 2008   CATARACT EXTRACTION Left 2008   HAMMER TOE SURGERY Left 1987   HAMMER TOE SURGERY Right 1986   LEFT AND RIGHT HEART CATHETERIZATION WITH CORONARY ANGIOGRAM N/A 10/04/2013   Procedure: LEFT AND RIGHT HEART CATHETERIZATION WITH CORONARY ANGIOGRAM;  Surgeon: Leonie Man, MD;  Location[facility] CATH LAB;  Service: Cardiovascular;  Laterality: N/A;   LUMBAR FUSION  2006   LUMBAR FUSION  2010   OOPHORECTOMY  1967   OVARIAN CYST REMOVAL     ROTATOR CUFF REPAIR Right 2013   SHOULDER ARTHROSCOPY  02/09/2011   Procedure: ARTHROSCOPY SHOULDER;  Surgeon: Hessie Dibble, MD;  Location: [place];  Service: Orthopedics;  Laterality: Right;  right shoulder arthroscopy subacromial decompression with rotator cuff repair   TONSILLECTOMY       Current Outpatient Medications  Medication Sig Dispense Refill   azelastine (ASTELIN) 0.1 % nasal spray 2 sprays per nostril 1-2 times daily as needed 30 mL 5   carvedilol (COREG) 12.5 MG tablet TAKE 1 TABLET BY MOUTH 2 TIMES DAILY. 180 tablet 3   cetirizine (ZYRTEC) 10 MG tablet Take 10 mg by mouth daily.  Cholecalciferol (VITAMIN D3) 50 MCG (2000 UT) capsule Take 1 capsule by mouth daily.     cromolyn (NASALCROM) 5.2 MG/ACT nasal spray Place 1 spray into both nostrils 4 (four) times daily.     Glucosamine-Chondroitin 1500-1200 MG/30ML LIQD Take 1 tablet by mouth daily.     losartan (COZAAR) 25 MG tablet TAKE 1 TABLET BY MOUTH DAILY. 90 tablet 2   pantoprazole (PROTONIX) 40 MG tablet Take 40 mg by mouth daily.     Polyethyl Glycol-Propyl Glycol 0.4-0.3 % SOLN Apply to eye. 2 drops in each eye 3 to 4 times daily     SALINE NASAL SPRAY NA Place into the nose. 2 sprays each nostril every 4 hours     sucralfate (CARAFATE) 1 g tablet Take 1 g by mouth daily as needed.      SYNTHROID 25 MCG  tablet TAKE 1 TABLET BY MOUTH DAILY BEFORE BREAKFAST 90 tablet 1   levocetirizine (XYZAL) 5 MG tablet Take 1 tablet (5 mg total) by mouth every evening. (Patient not taking: Reported on 10/21/2020) 30 tablet 5   No current facility-administered medications for this visit.    Allergies:   Acetaminophen, Alendronate sodium, Codeine, Diflunisal, Doxycycline, Flexeril [cyclobenzaprine], Iodinated diagnostic agents, Lisinopril, Nabumetone, Other, Oxybutynin, Raloxifene, Sulfa antibiotics, Symbicort [budesonide-formoterol fumarate], Tramadol hcl, Tramadol hcl, Diclofenac, and Latex    ROS:  Please see the history of present illness.   Otherwise, review of systems are positive for none.   All other systems are reviewed and negative.    PHYSICAL EXAM: VS:  BP (!) 148/76   Pulse 67   Ht '5\' 4"'$  (1.626 m)   Wt 129 lb 12.8 oz (58.9 kg)   SpO2 97%   BMI 22.28 kg/m  , BMI Body mass index is 22.28 kg/m. GENERAL:  Well appearing NECK:  No jugular venous distention, waveform within normal limits, carotid upstroke brisk and symmetric, no bruits, no thyromegaly LUNGS:  Clear to auscultation bilaterally CHEST:  Unremarkable HEART:  PMI not displaced or sustained,S1 and S2 within normal limits, no S3, no S4, no clicks, no rubs, no murmurs ABD:  Flat, positive bowel sounds normal in frequency in pitch, no bruits, no rebound, no guarding, no midline pulsatile mass, no hepatomegaly, no splenomegaly EXT:  2 plus pulses throughout, no edema, no cyanosis no clubbing   EKG:  EKG is  ordered today. Sinus rhythm, rate 67, axis within normal limits, intervals within normal limits, no acute ST-T wave changes.  Recent Labs: 05/14/2020: TSH 1.090 05/21/2020: ALT 14; BUN 20; Creatinine, Ser 0.93; Potassium 4.8; Sodium 136 06/19/2020: Hemoglobin 11.5; Platelets 312    Lipid Panel    Component Value Date/Time   CHOL 185 01/23/2020 0822   TRIG 37 01/23/2020 0822   HDL 68 01/23/2020 0822   CHOLHDL 2.7 01/23/2020  0822   CHOLHDL 2 06/08/2011 0830   VLDL 6.4 06/08/2011 0830   LDLCALC 109 (H) 01/23/2020 0822   LDLDIRECT 106 (H) 10/23/2018 1110   LDLDIRECT 131.0 02/05/2008 0931      Wt Readings from Last 3 Encounters:  10/21/20 129 lb 12.8 oz (58.9 kg)  10/07/20 128 lb 8 oz (58.3 kg)  07/29/20 130 lb 3.2 oz (59.1 kg)      Other studies Reviewed: Additional studies/ records that were reviewed today include:   Labs Review of the above records demonstrates:  Please see elsewhere in the note.     ASSESSMENT AND PLAN:   CHEST PAIN:  She had a negative Lexiscan Myoview  in 2021.  She had no change in symptoms.  No further testing is indicated.  HTN:       The blood pressure is at target at home.    It came down on repeat in the office.  No change in therapy.    PVCs:   She is not having any symptoms related to these other than some mild occasional palpitations.  TR:     This was mild on echo last year.  No follow-up is indicated.     Current medicines are reviewed at length with the patient today.  The patient does not have concerns regarding medicines.  The following changes have been made:   None  Labs/ tests ordered today include:  None  Orders Placed This Encounter  Procedures   EKG 12-Lead      Disposition:   FU with me in 12 months.    Signed, Minus Breeding, MD  10/21/2020 3:29 PM    [facility]

## 2020-10-20 DIAGNOSIS — M7061 Trochanteric bursitis, right hip: Secondary | ICD-10-CM | POA: Diagnosis not present

## 2020-10-21 ENCOUNTER — Other Ambulatory Visit: Payer: Self-pay

## 2020-10-21 ENCOUNTER — Ambulatory Visit: Payer: Medicare Other | Admitting: Cardiology

## 2020-10-21 ENCOUNTER — Encounter: Payer: Self-pay | Admitting: Cardiology

## 2020-10-21 VITALS — BP 148/76 | HR 67 | Ht 64.0 in | Wt 129.8 lb

## 2020-10-21 DIAGNOSIS — I493 Ventricular premature depolarization: Secondary | ICD-10-CM

## 2020-10-21 DIAGNOSIS — I10 Essential (primary) hypertension: Secondary | ICD-10-CM

## 2020-10-21 DIAGNOSIS — R072 Precordial pain: Secondary | ICD-10-CM

## 2020-10-21 DIAGNOSIS — Q228 Other congenital malformations of tricuspid valve: Secondary | ICD-10-CM

## 2020-10-21 NOTE — Patient Instructions (Signed)

## 2020-11-12 IMAGING — MG DIGITAL SCREENING BILAT W/ CAD
4 series · 4 of 4 positions shown · non-contrast
Comparison: Previous exam(s).

CLINICAL DATA: Screening.

EXAM:
DIGITAL SCREENING BILATERAL MAMMOGRAM WITH CAD

[L MLO]
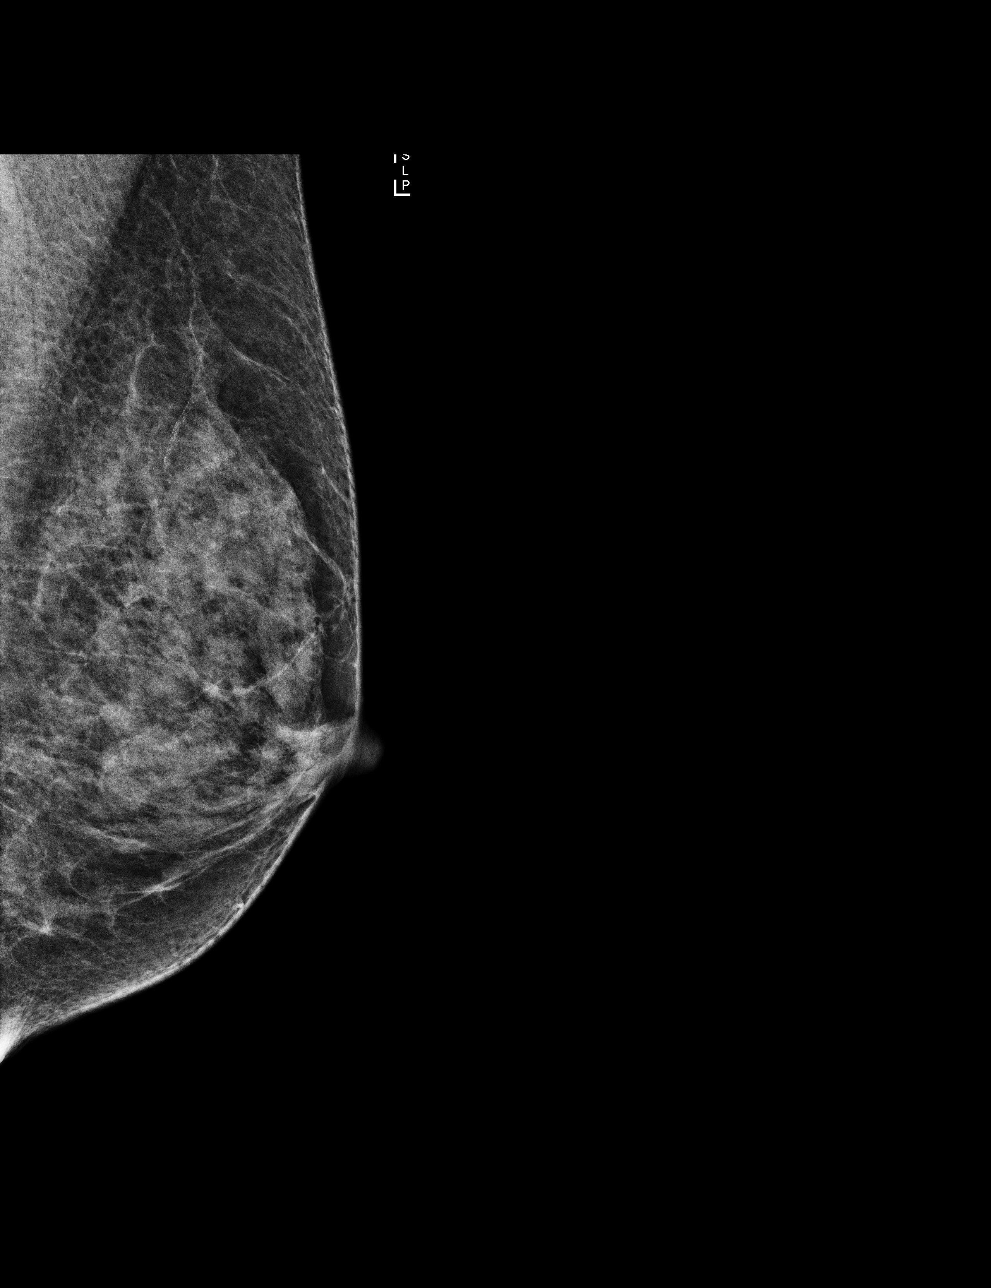

[L CC]
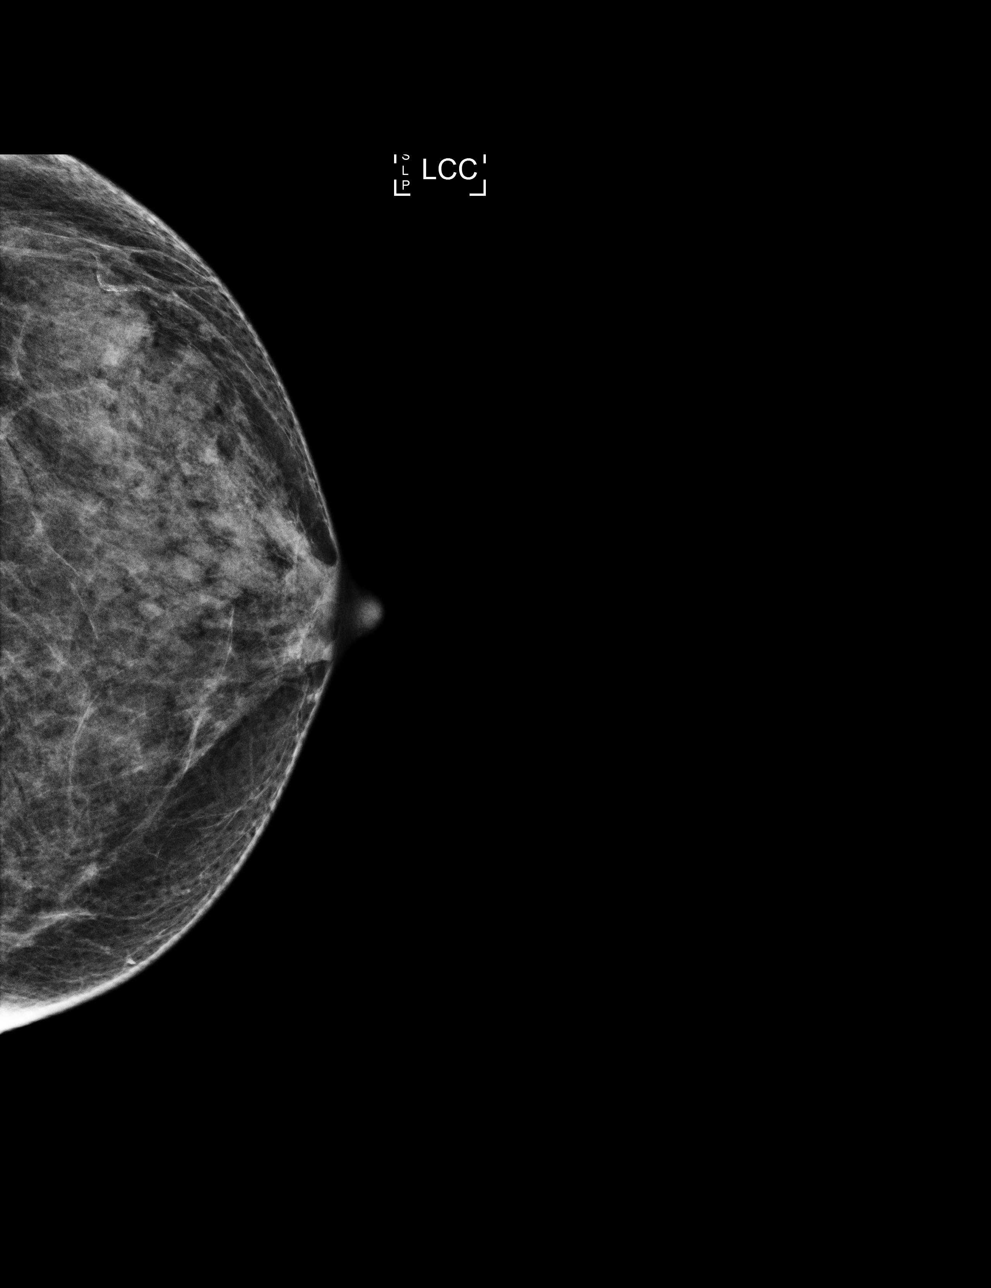

[R MLO]
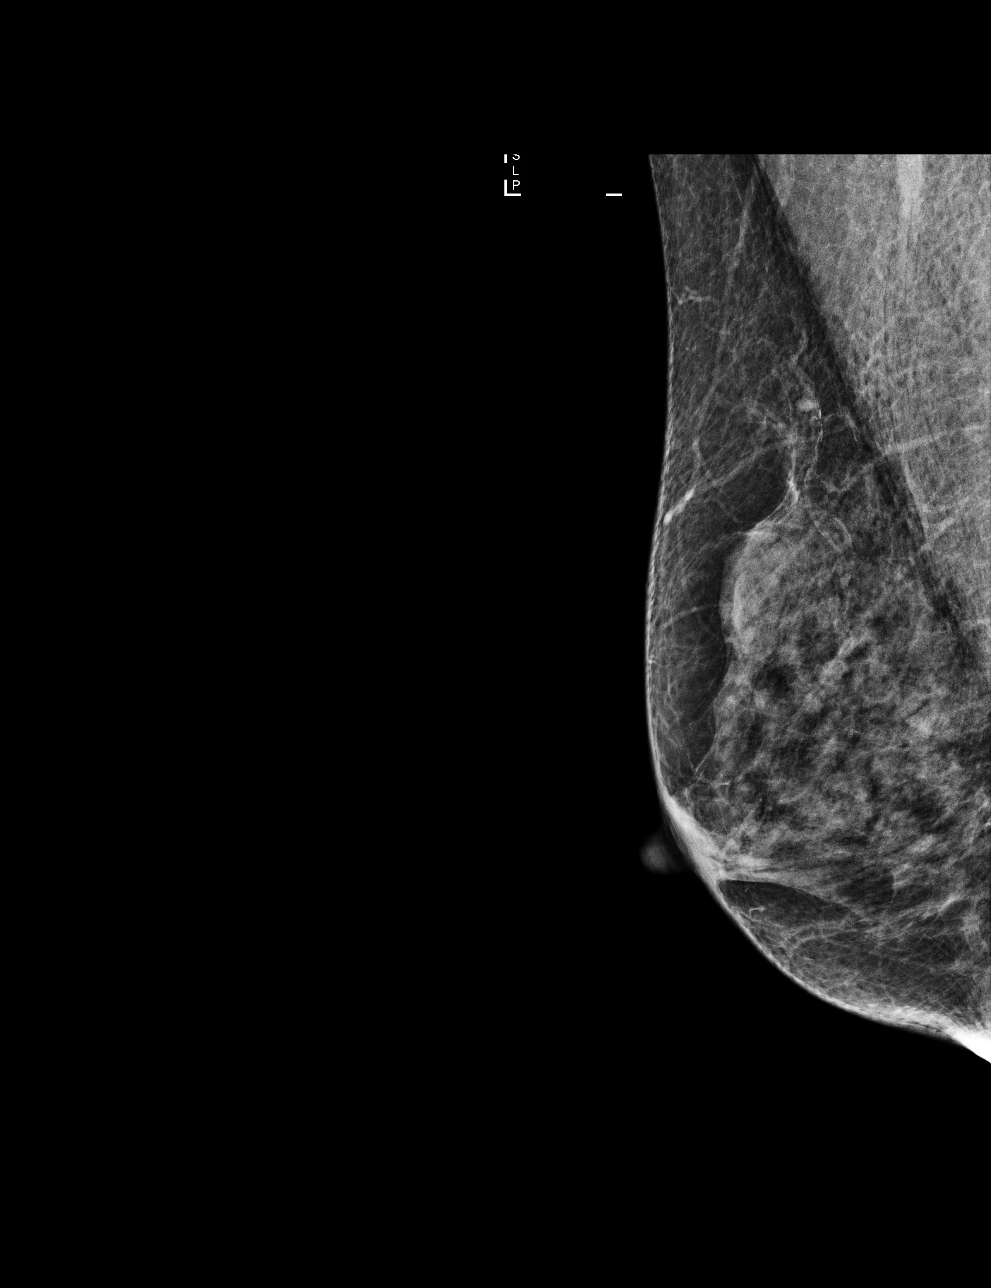

[R CC]
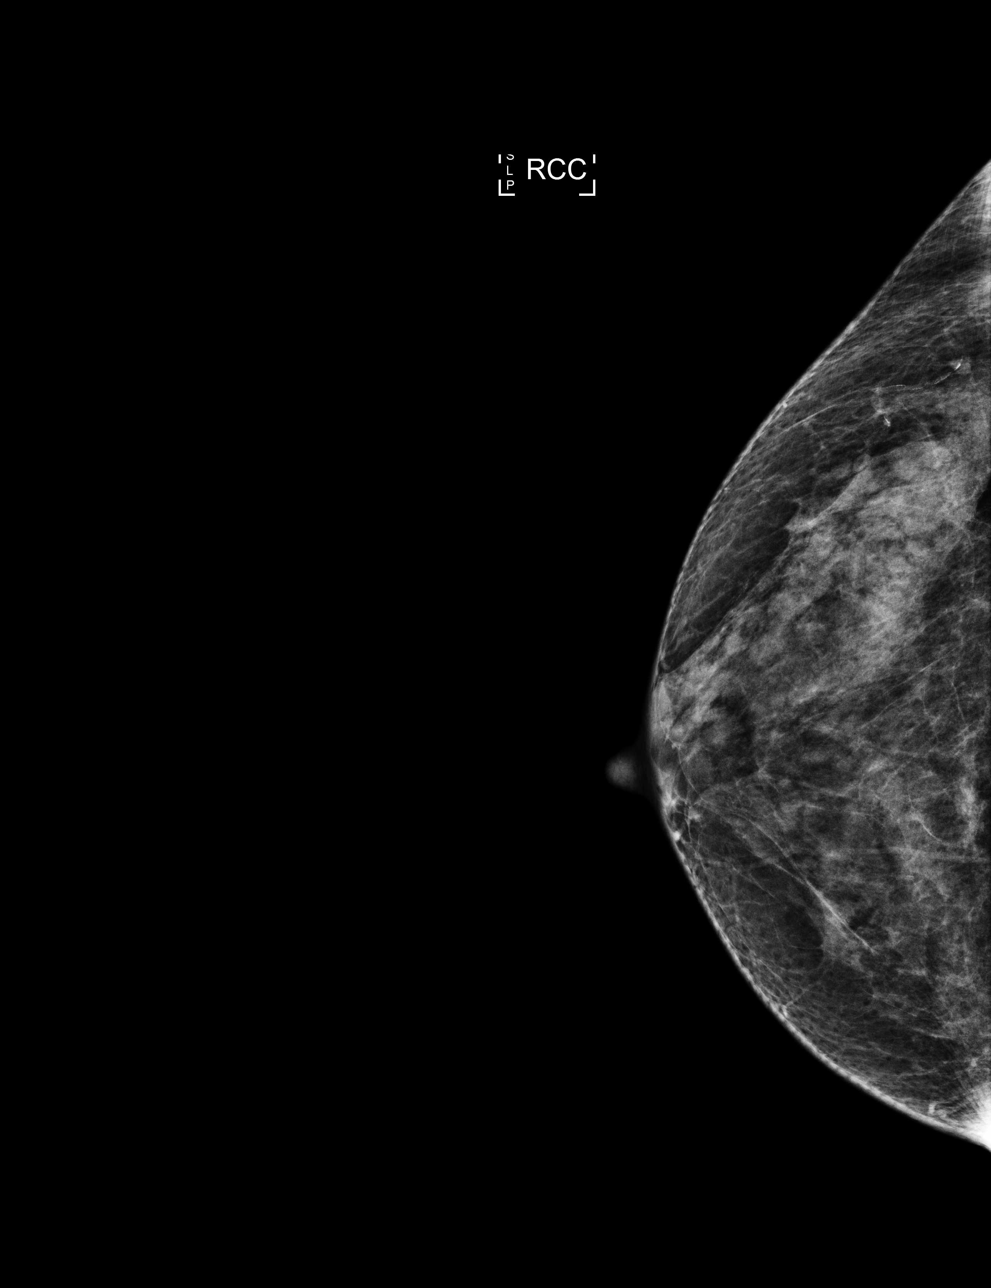

[4 of 4 positions shown; findings below may reference images not displayed]

ACR Breast Density Category c: The breast tissue is heterogeneously
dense, which may obscure small masses.
FINDINGS: There are no findings suspicious for malignancy. Images were
processed with CAD.
IMPRESSION: No mammographic evidence of malignancy. A result letter of this
screening mammogram will be mailed directly to the patient.

RECOMMENDATION:
Screening mammogram in one year. (Code:[id number]-[id number])

BI-RADS CATEGORY  1: Negative.

## 2020-11-18 ENCOUNTER — Telehealth: Payer: Self-pay | Admitting: Oncology

## 2020-11-18 NOTE — Telephone Encounter (Signed)
Scheduled per sch msg. Called and spoke with patient. Confirmed appt  

## 2020-11-19 ENCOUNTER — Other Ambulatory Visit: Payer: Self-pay

## 2020-11-19 ENCOUNTER — Inpatient Hospital Stay: Payer: Medicare Other | Attending: Oncology

## 2020-11-19 DIAGNOSIS — D472 Monoclonal gammopathy: Secondary | ICD-10-CM | POA: Insufficient documentation

## 2020-11-19 LAB — CBC WITH DIFFERENTIAL/PLATELET
Abs Immature Granulocytes: 0.02 10*3/uL (ref 0.00–0.07)
Basophils Absolute: 0.1 10*3/uL (ref 0.0–0.1)
Basophils Relative: 1 %
Eosinophils Absolute: 0.1 10*3/uL (ref 0.0–0.5)
Eosinophils Relative: 1 %
HCT: 37.5 % (ref 36.0–46.0)
Hemoglobin: 12 g/dL (ref 12.0–15.0)
Immature Granulocytes: 0 %
Lymphocytes Relative: 43 %
Lymphs Abs: 3 10*3/uL (ref 0.7–4.0)
MCH: 29.9 pg (ref 26.0–34.0)
MCHC: 32 g/dL (ref 30.0–36.0)
MCV: 93.3 fL (ref 80.0–100.0)
Monocytes Absolute: 1.1 10*3/uL — ABNORMAL HIGH (ref 0.1–1.0)
Monocytes Relative: 15 %
Neutro Abs: 2.8 10*3/uL (ref 1.7–7.7)
Neutrophils Relative %: 40 %
Platelets: 345 10*3/uL (ref 150–400)
RBC: 4.02 MIL/uL (ref 3.87–5.11)
RDW: 12.2 % (ref 11.5–15.5)
WBC: 7.1 10*3/uL (ref 4.0–10.5)
nRBC: 0 % (ref 0.0–0.2)

## 2020-11-19 LAB — COMPREHENSIVE METABOLIC PANEL
ALT: 17 U/L (ref 0–44)
AST: 20 U/L (ref 15–41)
Albumin: 3.6 g/dL (ref 3.5–5.0)
Alkaline Phosphatase: 70 U/L (ref 38–126)
Anion gap: 8 (ref 5–15)
BUN: 16 mg/dL (ref 8–23)
CO2: 29 mmol/L (ref 22–32)
Calcium: 9.2 mg/dL (ref 8.9–10.3)
Chloride: 102 mmol/L (ref 98–111)
Creatinine, Ser: 0.84 mg/dL (ref 0.44–1.00)
GFR, Estimated: >60 mL/min (ref >60)
Glucose, Bld: 80 mg/dL (ref 70–99)
Potassium: 4.3 mmol/L (ref 3.5–5.1)
Sodium: 139 mmol/L (ref 135–145)
Total Bilirubin: 0.4 mg/dL (ref 0.3–1.2)
Total Protein: 7 g/dL (ref 6.5–8.1)

## 2020-11-21 ENCOUNTER — Ambulatory Visit: Payer: Medicare Other | Attending: Internal Medicine | Admitting: Internal Medicine

## 2020-11-21 ENCOUNTER — Other Ambulatory Visit: Payer: Self-pay

## 2020-11-21 ENCOUNTER — Encounter: Payer: Self-pay | Admitting: Internal Medicine

## 2020-11-21 VITALS — BP 152/76 | HR 63 | Resp 16 | Wt 133.0 lb

## 2020-11-21 DIAGNOSIS — I10 Essential (primary) hypertension: Secondary | ICD-10-CM | POA: Diagnosis not present

## 2020-11-21 DIAGNOSIS — Z789 Other specified health status: Secondary | ICD-10-CM | POA: Insufficient documentation

## 2020-11-21 DIAGNOSIS — D649 Anemia, unspecified: Secondary | ICD-10-CM

## 2020-11-21 DIAGNOSIS — I2510 Atherosclerotic heart disease of native coronary artery without angina pectoris: Secondary | ICD-10-CM

## 2020-11-21 LAB — MULTIPLE MYELOMA PANEL, SERUM
Albumin SerPl Elph-Mcnc: 3.3 g/dL (ref 2.9–4.4)
Albumin/Glob SerPl: 1.2 (ref 0.7–1.7)
Alpha 1: 0.2 g/dL (ref 0.0–0.4)
Alpha2 Glob SerPl Elph-Mcnc: 0.6 g/dL (ref 0.4–1.0)
B-Globulin SerPl Elph-Mcnc: 1.7 g/dL — ABNORMAL HIGH (ref 0.7–1.3)
Gamma Glob SerPl Elph-Mcnc: 0.2 g/dL — ABNORMAL LOW (ref 0.4–1.8)
Globulin, Total: 2.8 g/dL (ref 2.2–3.9)
IgA: 14 mg/dL — ABNORMAL LOW (ref 64–422)
IgG (Immunoglobin G), Serum: 318 mg/dL — ABNORMAL LOW (ref 586–1602)
IgM (Immunoglobulin M), Srm: 1693 mg/dL — ABNORMAL HIGH (ref 26–217)
M Protein SerPl Elph-Mcnc: 1.1 g/dL — ABNORMAL HIGH
Total Protein ELP: 6.1 g/dL (ref 6.0–8.5)

## 2020-11-21 NOTE — Progress Notes (Signed)
Not   Patient ID: Kimberly Merritt, female    DOB: September 25, 1940  MRN: [id number]  CC: Hypertension   Subjective: Kimberly Merritt is a 80 y.o. 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 Loanne Drilling for the low cortisol level.  He did not feel that she has adrenal insufficiency.  Subsequent chemistry revealed normal sodium and potassium level.  I also reviewed her other recent labs that was ordered by her oncologist.  The anemia has improved.  HTN/CAD:  compliant with Coreg and Cozaar Checks BP intermittently.  BP yesterday was 122/68 and runs good at home.  She states it is always a little elevated when she comes to the doctor's office. Occasional SOB. Occasional precordial CP and palpitations.  Reports she does not get overly concern about the occasional palpitations.  She saw her cardiologist Dr. Percival Spanish the end of last month.  He did not make any changes to her medications.  Had steriod inj to RT hip 10/20/20 by Dr. Ninfa Meeker.  Currently on Prednisone pack for inflammation in the neck.  Has degenerative disc in neck.  Does some stretching exercise  Has a living well and brought a copy for our records.  Patient states she does not want to be a DNR but would not want her life prolonged by extraordinary means or by artificial nutrition or hydration if her condition is determined to be terminal and or incurable or if she is diagnosed as being in a persistent vegetative state.  HM:  had Fluzone Quad 11/07/2020 at [facility].  Declines shingles vaccine Patient Active Problem List   Diagnosis Date Noted   Low serum cortisol level 07/29/2020   Mixed stress and urge urinary incontinence 07/23/2020   Mild cognitive  impairment 07/23/2020   Chronic fatigue syndrome 01/16/2020   Diaphragmatic hernia 01/16/2020   Stable angina ([place]) 01/16/2020   Intermediate stage nonexudative age-related macular degeneration of both eyes 01/09/2020   Posterior vitreous detachment of right eye 01/09/2020   Degenerative retinal drusen, both eyes 01/09/2020   Vitreous hemorrhage of left eye ([place]) 01/09/2020   Precordial pain 10/17/2019   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   Estrogen deficiency 02/20/2019   Macular degeneration of left eye 02/20/2019   Counseled about COVID-19 virus infection/  vaccine 02/20/2019   Osteopenia after menopause 12/07/2018   h/o foot Bone spur- of the calcium type 12/07/2018   Atherosclerotic cardiovascular disease 12/07/2018   Family history of abdominal aortic aneurysm (AAA)- father, elder 12/07/2018   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   Hyperlipidemia 03/09/2018   Immunocompromised patient ([place]) 12/01/2017   Chronic pain of right knee-since age 44 per patient 04/12/2017   Prediabetes 04/12/2017   Vitamin D insufficiency 04/12/2017   CAD (coronary artery disease) 04/05/2017   Hypertension 04/05/2017   Atherosclerosis of aorta ([place]) 04/05/2017   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   History of colonic polyps 04/05/2017   Scoliosis 04/05/2017   Ventricular premature depolarization 04/05/2017   Benign colonic polyp- found in 07/2011- told f/up Dr Watt Climes 5 yrs 04/05/2017   disabled status- since age 35 due to RA in [place] 04/05/2017   Environmental and seasonal allergies 04/05/2017   History of hypoglycemia 04/05/2017   Post-menopausal 04/05/2017   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   PVC's (premature ventricular contractions) 05/18/2016  Chronic cough 10/23/2014   Nonischemic cardiomyopathy ([place]) 11/29/2013   Bradycardia 09/25/2013   Lip lesion 08/21/2013   Diverticulosis of colon without hemorrhage 05/02/2013   Paresthesia 06/20/2012   Well adult exam 08/31/2011    Cough 08/10/2011   Sinusitis, acute frontal 02/15/2011   Shoulder pain, right 07/09/2010   LIVER HEMANGIOMA 03/03/2010   MONOCLONAL GAMMOPATHY 03/03/2010   MUSCLE SPASM 03/03/2010   Herpes labialis 12/01/2009   Disorder of bone and cartilage 11/19/2008   HEPATIC CYST 08/02/2007   Allergic rhinitis 04/25/2007   HOARSENESS 04/25/2007   Hypothyroidism (acquired) 08/29/2006   VARICOSE VEINS, LOWER EXTREMITIES 08/29/2006   Gastroesophageal reflux disease 08/29/2006   Generalized osteoarthritis 08/29/2006   LOW BACK PAIN 08/29/2006   Pain in Soft Tissues of Limb 08/29/2006     Current Outpatient Medications on File Prior to Visit  Medication Sig Dispense Refill   azelastine (ASTELIN) 0.1 % nasal spray 2 sprays per nostril 1-2 times daily as needed (Patient not taking: Reported on 11/21/2020) 30 mL 5   carvedilol (COREG) 12.5 MG tablet TAKE 1 TABLET BY MOUTH 2 TIMES DAILY. 180 tablet 3   cetirizine (ZYRTEC) 10 MG tablet Take 10 mg by mouth daily.     Cholecalciferol (VITAMIN D3) 50 MCG (2000 UT) capsule Take 1 capsule by mouth daily.     cromolyn (NASALCROM) 5.2 MG/ACT nasal spray Place 1 spray into both nostrils 4 (four) times daily.     Glucosamine-Chondroitin 1500-1200 MG/30ML LIQD Take 1 tablet by mouth daily.     levocetirizine (XYZAL) 5 MG tablet Take 1 tablet (5 mg total) by mouth every evening. (Patient not taking: No sig reported) 30 tablet 5   losartan (COZAAR) 25 MG tablet TAKE 1 TABLET BY MOUTH DAILY. 90 tablet 2   pantoprazole (PROTONIX) 40 MG tablet Take 40 mg by mouth daily.     Polyethyl Glycol-Propyl Glycol 0.4-0.3 % SOLN Apply to eye. 2 drops in each eye 3 to 4 times daily     SALINE NASAL SPRAY NA Place into the nose. 2 sprays each nostril every 4 hours (Patient not taking: Reported on 11/21/2020)     sucralfate (CARAFATE) 1 g tablet Take 1 g by mouth daily as needed.      SYNTHROID 25 MCG tablet TAKE 1 TABLET BY MOUTH DAILY BEFORE BREAKFAST 90 tablet 1   No current  facility-administered medications on file prior to visit.    Allergies  Allergen Reactions   Acetaminophen Rash    REACTION: high dose - tremor Per pt - rash on chest and heart palpitations   Alendronate Sodium     Stomach upset.    Codeine     May interfere with heart condition     Diflunisal     Stomach pain.     Doxycycline     n/v   Flexeril [Cyclobenzaprine]     Abdominal pain.     Iodinated Diagnostic Agents Other (See Comments)    Syncope.    Lisinopril Other (See Comments)   Nabumetone     Abdominal pain; dark, tarry stool.     Other Other (See Comments)   Oxybutynin     Pt does not remember.     Raloxifene     Unknown.     Sulfa Antibiotics Nausea Only    Syncope.     Symbicort [Budesonide-Formoterol Fumarate]     "Jittery-ness."    Tramadol Hcl     Numbness.     Tramadol Hcl Itching  Numbness.     Diclofenac Palpitations    Dark, tarry stool.     Latex Rash    Social History   Socioeconomic History   Marital status: Widowed    Spouse name: Not on file   Number of children: 0   Years of education: Not on file   Highest education level: Not on file  Occupational History   Occupation: Retired  Tobacco Use   Smoking status: Never   Smokeless tobacco: Never  Vaping Use   Vaping Use: Never used  Substance and Sexual Activity   Alcohol use: No    Alcohol/week: 0.0 standard drinks   Drug use: No   Sexual activity: Not Currently  Other Topics Concern   Not on file  Social History Narrative   Lives alone.     Social Determinants of Health   Financial Resource Strain: Not on file  Food Insecurity: Not on file  Transportation Needs: Not on file  Physical Activity: Not on file  Stress: Not on file  Social Connections: Not on file  Intimate Partner Violence: Not on file    Family History  Problem Relation Age of Onset   Stroke Mother    Cancer Father    Emphysema Father    Hypertension Other    Breast cancer Neg Hx     Past Surgical  History:  Procedure Laterality Date   ABDOMINAL HYSTERECTOMY  1981   APPENDECTOMY     BACK SURGERY  2010,2006   lumb fusion   BUNIONECTOMY     both   BUNIONECTOMY Right [place]     both   CARPAL TUNNEL RELEASE Right 1992   CARPAL TUNNEL RELEASE Left 1992   CATARACT EXTRACTION     both   CATARACT EXTRACTION Right 2008   CATARACT EXTRACTION Left 2008   HAMMER TOE SURGERY Left 1987   HAMMER TOE SURGERY Right 1986   LEFT AND RIGHT HEART CATHETERIZATION WITH CORONARY ANGIOGRAM N/A 10/04/2013   Procedure: LEFT AND RIGHT HEART CATHETERIZATION WITH CORONARY ANGIOGRAM;  Surgeon: Leonie Man, MD;  Location[facility] CATH LAB;  Service: Cardiovascular;  Laterality: N/A;   LUMBAR FUSION  2006   LUMBAR FUSION  2010   OOPHORECTOMY  1967   OVARIAN CYST REMOVAL     ROTATOR CUFF REPAIR Right 2013   SHOULDER ARTHROSCOPY  02/09/2011   Procedure: ARTHROSCOPY SHOULDER;  Surgeon: Hessie Dibble, MD;  Location: [place];  Service: Orthopedics;  Laterality: Right;  right shoulder arthroscopy subacromial decompression with rotator cuff repair   TONSILLECTOMY      ROS: Review of Systems Negative except as stated above  PHYSICAL EXAM: BP (!) 152/76   Pulse 63   Resp 16   Wt 133 lb (60.3 kg)   SpO2 97%   BMI 22.83 kg/m   Wt Readings from Last 3 Encounters:  11/21/20 133 lb (60.3 kg)  10/21/20 129 lb 12.8 oz (58.9 kg)  10/07/20 128 lb 8 oz (58.3 kg)  BP 150/80  Physical Exam  General appearance - alert, well appearing,   Pleasant elderly Caucasian femaleand in no distress Mental status - normal mood, behavior, speech, dress, motor activity, and thought processes Neck -no cervical lymphadenopathy or masses. Chest - clear to auscultation, no wheezes, rales or rhonchi, symmetric air entry Heart - normal rate, regular rhythm, normal S1, S2, no murmurs, rubs, clicks or gallops Extremities - peripheral pulses normal, no pedal edema, no clubbing or cyanosis MSK:  She has significant arthritic deformities of the PIP and DIP joints of the fingers.  CMP Latest Ref Rng & Units 11/19/2020 05/21/2020 05/14/2020  Glucose 70 - 99 mg/dL 80 108(H) 119(H)  BUN 8 - 23 mg/dL 16 20 19   Creatinine 0.44 - 1.00 mg/dL 0.84 0.93 0.92  Sodium 135 - 145 mmol/L 139 136 129(L)  Potassium 3.5 - 5.1 mmol/L 4.3 4.8 5.3(H)  Chloride 98 - 111 mmol/L 102 95(L) 91(L)  CO2 22 - 32 mmol/L 29 23 22   Calcium 8.9 - 10.3 mg/dL 9.2 9.1 9.0  Total Protein 6.5 - 8.1 g/dL 7.0 7.0 -  Total Bilirubin 0.3 - 1.2 mg/dL 0.4 0.3 -  Alkaline Phos 38 - 126 U/L 70 91 -  AST 15 - 41 U/L 20 13 -  ALT 0 - 44 U/L 17 14 -   Lipid Panel     Component Value Date/Time   CHOL 185 01/23/2020 0822   TRIG 37 01/23/2020 0822   HDL 68 01/23/2020 0822   CHOLHDL 2.7 01/23/2020 0822   CHOLHDL 2 06/08/2011 0830   VLDL 6.4 06/08/2011 0830   LDLCALC 109 (H) 01/23/2020 0822   LDLDIRECT 106 (H) 10/23/2018 1110   LDLDIRECT 131.0 02/05/2008 0931    CBC    Component Value Date/Time   WBC 7.1 11/19/2020 1019   RBC 4.02 11/19/2020 1019   HGB 12.0 11/19/2020 1019   HGB 11.5 05/14/2020 1520   HGB 12.2 11/04/2016 1115   HCT 37.5 11/19/2020 1019   HCT 35.8 05/14/2020 1520   HCT 36.8 11/04/2016 1115   PLT 345 11/19/2020 1019   PLT 380 05/14/2020 1520   MCV 93.3 11/19/2020 1019   MCV 94 05/14/2020 1520   MCV 94.4 11/04/2016 1115   MCH 29.9 11/19/2020 1019   MCHC 32.0 11/19/2020 1019   RDW 12.2 11/19/2020 1019   RDW 10.9 (L) 05/14/2020 1520   RDW 12.5 11/04/2016 1115   LYMPHSABS 3.0 11/19/2020 1019   LYMPHSABS 3.5 (H) 01/23/2020 0822   LYMPHSABS 2.6 11/04/2016 1115   MONOABS 1.1 (H) 11/19/2020 1019   MONOABS 0.9 11/04/2016 1115   EOSABS 0.1 11/19/2020 1019   EOSABS 0.1 01/23/2020 0822   BASOSABS 0.1 11/19/2020 1019   BASOSABS 0.1 01/23/2020 0822   BASOSABS 0.0 11/04/2016 1115    ASSESSMENT AND PLAN: 1. Essential hypertension Not at goal but patient reports readings have been good at home.  No  changes made in medications.  Continue low-salt diet.  2. Normocytic anemia Improved.  3. CAD in native artery Stable.  Continue carvedilol.  4. Living will in place I have sent a copy of her living will to be scanned into the record.    Patient was given the opportunity to ask questions.  Patient verbalized understanding of the plan and was able to repeat key elements of the plan.   No orders of the defined types were placed in this encounter.    Requested Prescriptions    No prescriptions requested or ordered in this encounter    Return in about 4 months (around 03/24/2021).  Karle Plumber, MD, FACP

## 2020-11-24 ENCOUNTER — Encounter: Payer: Self-pay | Admitting: Oncology

## 2020-11-26 ENCOUNTER — Ambulatory Visit: Payer: Medicare Other | Admitting: Oncology

## 2020-12-04 ENCOUNTER — Encounter: Payer: Self-pay | Admitting: Adult Health

## 2020-12-04 ENCOUNTER — Other Ambulatory Visit: Payer: Self-pay | Admitting: Physician Assistant

## 2020-12-04 ENCOUNTER — Inpatient Hospital Stay: Payer: Medicare Other | Attending: Oncology | Admitting: Adult Health

## 2020-12-04 ENCOUNTER — Inpatient Hospital Stay: Payer: Medicare Other

## 2020-12-04 ENCOUNTER — Other Ambulatory Visit: Payer: Self-pay

## 2020-12-04 VITALS — BP 164/70 | HR 74 | Temp 98.8°F | Resp 18

## 2020-12-04 VITALS — BP 141/63 | HR 63 | Temp 97.2°F | Resp 18 | Ht 64.0 in | Wt 133.4 lb

## 2020-12-04 DIAGNOSIS — D801 Nonfamilial hypogammaglobulinemia: Secondary | ICD-10-CM | POA: Diagnosis not present

## 2020-12-04 DIAGNOSIS — D472 Monoclonal gammopathy: Secondary | ICD-10-CM

## 2020-12-04 DIAGNOSIS — K130 Diseases of lips: Secondary | ICD-10-CM

## 2020-12-04 DIAGNOSIS — I509 Heart failure, unspecified: Secondary | ICD-10-CM | POA: Insufficient documentation

## 2020-12-04 DIAGNOSIS — R52 Pain, unspecified: Secondary | ICD-10-CM

## 2020-12-04 DIAGNOSIS — I428 Other cardiomyopathies: Secondary | ICD-10-CM | POA: Diagnosis not present

## 2020-12-04 DIAGNOSIS — R053 Chronic cough: Secondary | ICD-10-CM

## 2020-12-04 MED ORDER — EPINEPHRINE 0.3 MG/0.3ML IJ SOAJ: 0.3000 mg | Freq: Once | INTRAMUSCULAR | Status: DC | PRN

## 2020-12-04 MED ORDER — METHYLPREDNISOLONE SODIUM SUCC 125 MG IJ SOLR: 125.0000 mg | Freq: Once | INTRAMUSCULAR | Status: DC | PRN

## 2020-12-04 MED ORDER — DIPHENHYDRAMINE HCL 50 MG/ML IJ SOLN: 50.0000 mg | Freq: Once | INTRAMUSCULAR | Status: DC | PRN

## 2020-12-04 MED ORDER — ALBUTEROL SULFATE HFA 108 (90 BASE) MCG/ACT IN AERS: 2.0000 | INHALATION_SPRAY | Freq: Once | RESPIRATORY_TRACT | Status: DC | PRN

## 2020-12-04 MED ORDER — CILGAVIMAB (PART OF EVUSHELD) INJECTION
300.0000 mg | Freq: Once | INTRAMUSCULAR | Status: AC
  Administered 2020-12-04: 300 mg via INTRAMUSCULAR
  Filled 2020-12-04: qty 3

## 2020-12-04 MED ORDER — DIPHENHYDRAMINE HCL 12.5 MG/5ML PO ELIX
12.5000 mg | ORAL_SOLUTION | Freq: Once | ORAL | Status: AC
  Administered 2020-12-04: 12.5 mg via ORAL
  Filled 2020-12-04: qty 5

## 2020-12-04 MED ORDER — TIXAGEVIMAB (PART OF EVUSHELD) INJECTION
300.0000 mg | Freq: Once | INTRAMUSCULAR | Status: AC
  Administered 2020-12-04: 300 mg via INTRAMUSCULAR
  Filled 2020-12-04: qty 3

## 2020-12-04 MED ORDER — MORPHINE SULFATE (PF) 2 MG/ML IV SOLN
0.5000 mg | Freq: Once | INTRAVENOUS | Status: AC
  Administered 2020-12-04: 0.5 mg via INTRAVENOUS
  Filled 2020-12-04: qty 1

## 2020-12-04 MED ORDER — IMMUNE GLOBULIN (HUMAN) 10 GM/100ML IV SOLN
1.0000 g/kg | Freq: Once | INTRAVENOUS | Status: AC
  Administered 2020-12-04: 60 g via INTRAVENOUS
  Filled 2020-12-04: qty 200

## 2020-12-04 NOTE — Progress Notes (Signed)
Called to infusion center for patient experiencing body aches, chills, and headache while receiving IVIG and Evusheld. Dose was reduced by RN and headache significantly improved. Patient continues to experience body aches and chills. On my exam patient is neurologically intact, has stable vitals and no rigors. Patient admits to previous feelings in the past with treatment and reports symptoms resolved with morphine. She tells me she is unable to take tylenol and ibuprofen. She already received benadryl as a pre medication. Patient will be given low dose of morphine 0.5mg  and RN will continue to monitor. Patient agreeable to try reaming IVIG at lower rate.

## 2020-12-04 NOTE — Patient Instructions (Signed)
[facility]  Discharge Instructions: Thank you for choosing [facility] to provide your oncology and hematology care.   If you have a lab appointment with the Cancer Center, please go directly to the Cancer Center and check in at the registration area.   Wear comfortable clothing and clothing appropriate for easy access to any Portacath or PICC line.   We strive to give you quality time with your provider. You may need to reschedule your appointment if you arrive late (15 or more minutes).  Arriving late affects you and other patients whose appointments are after yours.  Also, if you miss three or more appointments without notifying the office, you may be dismissed from the clinic at the provider's discretion.      For prescription refill requests, have your pharmacy contact our office and allow 72 hours for refills to be completed.    Today you received the following chemotherapy and/or immunotherapy agents IVIG      To help prevent nausea and vomiting after your treatment, we encourage you to take your nausea medication as directed.  BELOW ARE SYMPTOMS THAT SHOULD BE REPORTED IMMEDIATELY: *FEVER GREATER THAN 100.4 F (38 C) OR HIGHER *CHILLS OR SWEATING *NAUSEA AND VOMITING THAT IS NOT CONTROLLED WITH YOUR NAUSEA MEDICATION *UNUSUAL SHORTNESS OF BREATH *UNUSUAL BRUISING OR BLEEDING *URINARY PROBLEMS (pain or burning when urinating, or frequent urination) *BOWEL PROBLEMS (unusual diarrhea, constipation, pain near the anus) TENDERNESS IN MOUTH AND THROAT WITH OR WITHOUT PRESENCE OF ULCERS (sore throat, sores in mouth, or a toothache) UNUSUAL RASH, SWELLING OR PAIN  UNUSUAL VAGINAL DISCHARGE OR ITCHING   Items with * indicate a potential emergency and should be followed up as soon as possible or go to the Emergency Department if any problems should occur.  Please show the CHEMOTHERAPY ALERT CARD or IMMUNOTHERAPY ALERT CARD at check-in to the  Emergency Department and triage nurse.  Should you have questions after your visit or need to cancel or reschedule your appointment, please contact [facility]  Dept: [phone]  and follow the prompts.  Office hours are 8:00 a.m. to 4:30 p.m. Monday - Friday. Please note that voicemails left after 4:00 p.m. may not be returned until the following business day.  We are closed weekends and major holidays. You have access to a nurse at all times for urgent questions. Please call the main number to the clinic Dept: [phone] and follow the prompts.   For any non-urgent questions, you may also contact your provider using MyChart. We now offer e-Visits for anyone 18 and older to request care online for non-urgent symptoms. For details visit [email or web address].   Also download the MyChart app! Go to the app store, search "MyChart", open the app, select , and log in with your MyChart username and password.  Due to Covid, a mask is required upon entering the hospital/clinic. If you do not have a mask, one will be given to you upon arrival. For doctor visits, patients may have 1 support person aged 18 or older with them. For treatment visits, patients cannot have anyone with them due to current Covid guidelines and our immunocompromised population.   

## 2020-12-04 NOTE — Progress Notes (Signed)
1635  PT c/o of pain in shoulders and neck; rate decreased to previous rate.  1637 Pt c/o pain in shoulders, neck and a headache.  She states it is getting worse.    Infusion stopped.  BP rechecked.  Verline Lema, [place] notified.  1701 Morphine given as ordered.  1706  Pt reports almost all pain in shoulders and neck has resolved.  Headache has resolved.  IVIG restarted at 145 ml/ hr.

## 2020-12-04 NOTE — Progress Notes (Signed)
[place]  Telephone:([phone]) [phone] Fax:(336) [phone]     ID: SHUNNA MIKAELIAN DOB: May 06, 1940  MR#: [id number]  FTD#:[id number]  Patient Care Team: Ladell Pier, MD as PCP - General (Internal Medicine) Minus Breeding, MD as PCP - Cardiology (Cardiology) Melrose Nakayama, MD as Attending Physician (Orthopedic Surgery) Magrinat, Virgie Dad, MD (Hematology and Oncology) Clarene Essex, MD (Gastroenterology) Rutherford Guys, MD as Attending Physician (Ophthalmology) Jovita Gamma, MD as Consulting Physician (Neurosurgery) Minus Breeding, MD as Consulting Physician (Cardiology) Levy Sjogren, MD as Referring Physician (Dermatology) Irine Seal, MD as Attending Physician (Urology) Mosetta Anis, MD as Referring Physician (Allergy) Elam Dutch, MD as Consulting Physician (Vascular Surgery) Hurman Horn, MD as Consulting Physician (Ophthalmology) OTHER MD: Minus Breeding MD, Marshell Garfinkel MD  CHIEF COMPLAINT: IgM M-GUS/ low-grade lymphoproliferative process  CURRENT TREATMENT: IVIG as needed; Brice Prairie returns today for 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   We are following her M-protein and her total IgG.  Her labs from last week are stable and noted below.  Results for BREYON, BLASS (MRN [id number]) as of 12/04/2020 11:42  Ref. Range 03/15/2019 10:58 06/12/2019 11:05 12/04/2019 10:41 05/21/2020 13:57 11/19/2020 10:18  IgG (Immunoglobin G), Serum Latest Ref Range: 586 - 1,602 mg/dL 623 415 (L) 368 (L) 400 (L) 318 (L)  Results for SAVINA, OLSHEFSKI (MRN [id number]) as of 12/04/2020 11:47  Ref. Range 11/04/2016 11:15 03/29/2017 10:35 07/27/2017 10:46 11/24/2017 10:44 05/31/2018 10:43 11/30/2018 10:32 03/15/2019 10:58 06/12/2019 11:05 12/04/2019 10:41 05/21/2020 13:57 11/19/2020 10:18  M Protein SerPl Elph-Mcnc Latest Ref Range: Not Observed g/dL 0.8 (H) 0.9 (H) 0.8 (H) 0.9 (H) 1.0 (H) 1.0 (H) 1.1 (H) 1.1 (H)  1.3 (H) 1.4 (H) 1.1 (H)   Chariah has noted she has felt increasingly fatigued.  She was talking to her PCP who suggested it was anemia, however her most recent hemoglobin was 12.  Careena also has had increased anxiety related to her outside yard area having her personal items moved around.    Anthonette remains active.  She enjoys yard work, and gardening.  Right now she has been digging up her irises and dividing them.  She is also raking leaves.  She limits the time she is active due to her h/o heart failure, so she makes sure she is not overdoing it.    She notes she did have full cardiac work up about one year ago and has been doing well since.    She underwent mammogram on 10/18/2020 that showed no evidence of malignancy.    REVIEW OF SYSTEMS: Review of Systems  Constitutional:  Positive for fatigue. Negative for appetite change, chills, fever and unexpected weight change.  HENT:   Negative for hearing loss, lump/mass and trouble swallowing.   Eyes:  Negative for eye problems and icterus.  Respiratory:  Negative for chest tightness, cough and shortness of breath.   Cardiovascular:  Negative for chest pain, leg swelling and palpitations.  Gastrointestinal:  Positive for nausea (occasional at mid afternoon.  Sometimes after she eats). Negative for abdominal distention, abdominal pain, constipation, diarrhea and vomiting.  Endocrine: Negative for hot flashes.  Genitourinary:  Negative for difficulty urinating.   Musculoskeletal:  Negative for arthralgias.  Skin:  Negative for itching and rash.  Neurological:  Negative for dizziness, extremity weakness, headaches and numbness.  Hematological:  Negative for adenopathy. Does not bruise/bleed easily.  Psychiatric/Behavioral:  Negative for depression. The patient is nervous/anxious.     COVID  108 VACCINATION STATUS: fully vaccinated [employer]), with booster 10/2019   HISTORY PRESENT ILLNESS::  from the earlier summary note:  "Joycelyn Schmid" Blunt is a 80  y/o [place] woman I evaluated originally in October of 2004 for a monoclonal gammopathy. At that time she had a white cell count of 7.2, hemoglobin 12.8, MCV 94.5, and platelets 298,000. Protein workup showed an IgM kappa paraprotein of 0.6 g, with a total IgG slightly depressed at 556, but a normal IgA at 82. Because an IgM paraprotein can be associated with a low grade lymphoproliferative process (Waldenstrm's macroglobulinemia) [name] underwent bone marrow biopsy 11/27/2002, which showed ([id number]) a normal cellular marrow with trilineage hematopoiesis and 5% plasma cells. The peripheral blood film showed no significant abnormalities and on the biopsy para-spicular lymphoid aggregates were not noted. 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  The patient was followed with observation alone through May of 2009, with no evidence of progression in her IgM kappa clone. As of 06/03/2007 her total IgM spike was 0.83, her IgG was 490 and her IgA was 36. The patient was released from followup here at that time.  More recently, as the 10 year anniversary of her original bone marrow biopsy past, Dr. Dorann Lodge of referred the patient back for reassessment and possible bone marrow rebiopsy. Joycelyn Schmid was scheduled here for lab work 09/25/2013. However when she had her blood drawn she complained of some shortness of breath and her pulse was checked. It was 33. The patient was referred to the emergency room where she was evaluated by Dr. Percival Spanish who felt the patient's true heart rate was higher (she has ectopic beats which generated a week pulse and may have been missed) since in the emergency room EKG her rate was 98 with normal sinus rhythm. Troponins were negative. BNP was slightly elevated.  Dr. Percival Spanish set the patient up for an echocardiogram 09/27/2013 and this showed an ejection fraction of 30-35%. There was diffuse hypokinesis. She underwent right and left heart  catheterization 10/04/2013 which showed no significant obstructions. The study did confirm an ejection fraction of roughly 35% with global hypokinesis. She is followed by Dr. Percival Spanish for her new diagnosis of non-ischemic cardiomyopathy.  Her subsequent history is as detailed below   PAST MEDICAL HISTORY: Past Medical History:  Diagnosis Date   Allergy    CAD (coronary artery disease)    Nonobstructive. LAD 30% stenosis, small PDA of the circumflex 70% stenosis, right coronary artery 40% stenosis. 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/03/2010   MUSCLE SPASM 03/03/2010   Nonischemic cardiomyopathy ([place])    EF 30%   OSTEOARTHRITIS 08/29/2006   OSTEOPENIA 11/19/2008   PARESTHESIA 03/05/2009   PVC's (premature ventricular contractions)    Scoliosis    VARICOSE VEINS, LOWER EXTREMITIES 08/29/2006    PAST SURGICAL HISTORY: Past Surgical History:  Procedure Laterality Date   ABDOMINAL HYSTERECTOMY  1981   APPENDECTOMY     BACK SURGERY  2010,2006   lumb fusion   BUNIONECTOMY     both   BUNIONECTOMY Right 1985   CARPAL TUNNEL RELEASE     both   CARPAL TUNNEL RELEASE Right 1992   CARPAL TUNNEL RELEASE Left 1992   CATARACT EXTRACTION     both  CATARACT EXTRACTION Right 2006/04/25   CATARACT EXTRACTION Left 04-25-06   HAMMER TOE SURGERY Left 1987   HAMMER TOE SURGERY Right 1986   LEFT AND RIGHT HEART CATHETERIZATION WITH CORONARY ANGIOGRAM N/A 10/04/2013   Procedure: LEFT AND RIGHT HEART CATHETERIZATION WITH CORONARY ANGIOGRAM;  Surgeon: Leonie Man, MD;  Location[facility] C[facility] LAB;  Service: Cardiovascular;  Laterality: N/A;   LUMBAR FUSION  April 24, 2004   LUMBAR FUSION  Apr 24, 2008   OOPHORECTOMY  1967   OVARIAN CYST REMOVAL     ROTATOR CUFF REPAIR Right 04-25-2011   SHOULDER ARTHROSCOPY  02/09/2011   Procedure:  ARTHROSCOPY SHOULDER;  Surgeon: Hessie Dibble, MD;  Location: [facility];  Service: Orthopedics;  Laterality: Right;  right shoulder arthroscopy subacromial decompression with rotator cuff repair   TONSILLECTOMY      FAMILY HISTORY Family History  Problem Relation Age of Onset   Stroke Mother    Cancer Father    Emphysema Father    Hypertension Other    Breast cancer Neg Hx    the patient's father died at the age of 51 from lung cancer in the setting of tobacco abuse. [name] mother died at the age of 33 following a stroke. The patient had one brother, who is severely retarded and died from pneumonia at the age of 67. There were no sisters. There is no history of blood problems or cancer in the family to the patient's knowledge   GYNECOLOGIC HISTORY:  No LMP recorded. Patient is postmenopausal. Menarche age 75. The patient is GX P0. She stopped having periods in 04/25/1978 and took hormone replacement for almost 20 years.   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 Her husband died in 04-25-1991 from a myocardial infarction.  She tells me she has no family.  She lives by herself, in [place] acres, with one cat for company.    ADVANCED DIRECTIVES: Her healthcare power of attorney is Charm Barges who can be reached at [phone] (cell), or [phone] (home).  Also if Baldo Ash had an acute problem she would want [place] to contact her neighbor and third cousin Andrey Cota at [place]: Social History   Tobacco Use   Smoking status: Never   Smokeless tobacco: Never  Vaping Use   Vaping Use: Never used  Substance Use Topics   Alcohol use: No    Alcohol/week: 0.0 standard drinks   Drug use: No     Colonoscopy: 04-24-2012  PAP: 2011-04-25  Bone density: 11/2018, -2.4  Lipid panel:  Allergies  Allergen Reactions   Acetaminophen Rash    REACTION: high dose - tremor Per pt - rash on chest and heart palpitations    Alendronate Sodium     Stomach upset.    Codeine     May interfere with heart condition     Diflunisal     Stomach pain.     Doxycycline     n/v   Flexeril [Cyclobenzaprine]     Abdominal pain.     Iodinated Diagnostic Agents Other (See Comments)    Syncope.    Lisinopril Other (See Comments)   Nabumetone     Abdominal pain; dark, tarry stool.     Other Other (See Comments)   Oxybutynin     Pt does not remember.     Raloxifene     Unknown.     Sulfa Antibiotics Nausea Only    Syncope.  Symbicort [Budesonide-Formoterol Fumarate]     "Jittery-ness."    Tramadol Hcl     Numbness.     Tramadol Hcl Itching    Numbness.     Diclofenac Palpitations    Dark, tarry stool.     Latex Rash    Current Outpatient Medications  Medication Sig Dispense Refill   carvedilol (COREG) 12.5 MG tablet TAKE 1 TABLET BY MOUTH 2 TIMES DAILY. 180 tablet 3   cetirizine (ZYRTEC) 10 MG tablet Take 10 mg by mouth daily.     Cholecalciferol (VITAMIN D3) 50 MCG (2000 UT) capsule Take 1 capsule by mouth daily.     cromolyn (NASALCROM) 5.2 MG/ACT nasal spray Place 1 spray into both nostrils 4 (four) times daily.     Glucosamine-Chondroitin 1500-1200 MG/30ML LIQD Take 1 tablet by mouth daily.     losartan (COZAAR) 25 MG tablet TAKE 1 TABLET BY MOUTH DAILY. 90 tablet 2   pantoprazole (PROTONIX) 40 MG tablet Take 40 mg by mouth daily.     Polyethyl Glycol-Propyl Glycol 0.4-0.3 % SOLN Apply to eye. 2 drops in each eye 3 to 4 times daily     sucralfate (CARAFATE) 1 g tablet Take 1 g by mouth daily as needed.      SYNTHROID 25 MCG tablet TAKE 1 TABLET BY MOUTH DAILY BEFORE BREAKFAST 90 tablet 1   No current facility-administered medications for this visit.    OBJECTIVE:  Vitals:   12/04/20 1132  BP: (!) 141/63  Pulse: 63  Resp: 18  Temp: (!) 97.2 F (36.2 C)  SpO2: 98%     Body mass index is 22.9 kg/m.    ECOG FS:2 - Symptomatic, <50% confined to bed GENERAL: Patient is a well appearing  female in no acute distress HEENT:  Sclerae anicteric.  Mask in place. Neck is supple.  NODES:  No cervical, supraclavicular, or axillary lymphadenopathy palpated.  BREAST EXAM:  Deferred. LUNGS:  Clear to auscultation bilaterally.  No wheezes or rhonchi. HEART:  Regular rate and rhythm. No murmur appreciated. ABDOMEN:  Soft, nontender.  Positive, normoactive bowel sounds. No organomegaly palpated. MSK:  No focal spinal tenderness to palpation. Full range of motion bilaterally in the upper extremities. EXTREMITIES:  No peripheral edema.   SKIN:  Clear with no obvious rashes or skin changes. No nail dyscrasia. NEURO:  Nonfocal. Well oriented.  Appropriate affect.    LAB RESULTS:  CMP     Component Value Date/Time   NA 139 11/19/2020 1019   NA 136 05/21/2020 1357   NA 136 11/04/2016 1115   K 4.3 11/19/2020 1019   K 4.8 11/04/2016 1115   CL 102 11/19/2020 1019   CO2 29 11/19/2020 1019   CO2 27 11/04/2016 1115   GLUCOSE 80 11/19/2020 1019   GLUCOSE 95 11/04/2016 1115   BUN 16 11/19/2020 1019   BUN 20 05/21/2020 1357   BUN 16.7 11/04/2016 1115   CREATININE 0.84 11/19/2020 1019   CREATININE 0.8 11/04/2016 1115   CALCIUM 9.2 11/19/2020 1019   CALCIUM 9.0 11/04/2016 1115   PROT 7.0 11/19/2020 1019   PROT 7.0 05/21/2020 1357   PROT 6.6 11/04/2016 1115   ALBUMIN 3.6 11/19/2020 1019   ALBUMIN 4.0 05/21/2020 1357   ALBUMIN 3.5 11/04/2016 1115   AST 20 11/19/2020 1019   AST 23 11/04/2016 1115   ALT 17 11/19/2020 1019   ALT 18 11/04/2016 1115   ALKPHOS 70 11/19/2020 1019   ALKPHOS 82 11/04/2016 1115   BILITOT 0.4 11/19/2020  1019   BILITOT 0.3 05/21/2020 1357   BILITOT 0.40 11/04/2016 1115   GFRNONAA >60 11/19/2020 1019   GFRNONAA 69 10/02/2013 1234   GFRAA 74 01/23/2020 0822   GFRAA 80 10/02/2013 1234     No results found for: SPEP  Lab Results  Component Value Date   WBC 7.1 11/19/2020   NEUTROABS 2.8 11/19/2020   HGB 12.0 11/19/2020   HCT 37.5 11/19/2020   MCV  93.3 11/19/2020   PLT 345 11/19/2020      Chemistry      Component Value Date/Time   NA 139 11/19/2020 1019   NA 136 05/21/2020 1357   NA 136 11/04/2016 1115   K 4.3 11/19/2020 1019   K 4.8 11/04/2016 1115   CL 102 11/19/2020 1019   CO2 29 11/19/2020 1019   CO2 27 11/04/2016 1115   BUN 16 11/19/2020 1019   BUN 20 05/21/2020 1357   BUN 16.7 11/04/2016 1115   CREATININE 0.84 11/19/2020 1019   CREATININE 0.8 11/04/2016 1115   GLU 89 04/08/2016 0000      Component Value Date/Time   CALCIUM 9.2 11/19/2020 1019   CALCIUM 9.0 11/04/2016 1115   ALKPHOS 70 11/19/2020 1019   ALKPHOS 82 11/04/2016 1115   AST 20 11/19/2020 1019   AST 23 11/04/2016 1115   ALT 17 11/19/2020 1019   ALT 18 11/04/2016 1115   BILITOT 0.4 11/19/2020 1019   BILITOT 0.3 05/21/2020 1357   BILITOT 0.40 11/04/2016 1115       No results found for: LABCA2  No components found for: LABCA125  No results for input(s): INR in the last 168 hours.  Urinalysis    Component Value Date/Time   COLORURINE LT. YELLOW 06/08/2011 0830   APPEARANCEUR Clear 05/14/2020 1519   LABSPEC 1.010 06/08/2011 0830   PHURINE 6.5 06/08/2011 0830   GLUCOSEU Negative 05/14/2020 1519   GLUCOSEU NEGATIVE 06/08/2011 0830   HGBUR NEGATIVE 06/08/2011 0830   BILIRUBINUR Negative 05/14/2020 1519   KETONESUR NEGATIVE 06/08/2011 0830   PROTEINUR Negative 05/14/2020 1519   PROTEINUR NEGATIVE 04/19/2008 1216   UROBILINOGEN 0.2 10/23/2018 1047   UROBILINOGEN 0.2 06/08/2011 0830   NITRITE Negative 05/14/2020 1519   NITRITE NEGATIVE 06/08/2011 0830   LEUKOCYTESUR Negative 05/14/2020 1519    STUDIES: No results found.   ASSESSMENT: 80 y.o. 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 When it causes amyloidosis of the heart, which is quite rare, the  pattern is restrictive. 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 In case of an infection, IVIG supplementation should be considered  (a) received IVIG October 2016  (b) repeated IVIG October 2018  (c) repeated November 2019 and November 2020  (d) Evusheld May 2022  (E) IVIG and Evusheld 12/2020   PLAN:  Phyllis Ginger is here today for follow-up of her monoclonal gammopathy.  I reviewed her labs with her which are stable.  She has no signs of endorgan issues that are seen in multiple myeloma.  Her IgG level is 318.  I reviewed this with her.  She will receive IVIG today.  She received a view shield every 42-month  She will receive this today as well.  She will return in 6 months  for labs and follow-up with Dr. Chryl Heck and then again in 1 year.  She knows to call for any other issue that may develop before the next visit.  Total encounter time 20 minutes.Wilber Bihari, NP 12/04/20 3:58 PM Medical Oncology and Hematology [facility] [place], [place] [address] Tel. [phone]    Fax. [phone]   *Total Encounter Time as defined by the Centers for Medicare and Medicaid Services includes, in addition to the face-to-face time of a patient visit (documented in the note above) non-face-to-face time: obtaining and reviewing outside history, ordering and reviewing medications, tests or procedures, care coordination (communications with other health care professionals or caregivers) and documentation in the medical record.

## 2021-01-08 ENCOUNTER — Encounter (INDEPENDENT_AMBULATORY_CARE_PROVIDER_SITE_OTHER): Payer: Medicare Other | Admitting: Ophthalmology

## 2021-01-13 ENCOUNTER — Ambulatory Visit: Payer: Medicare Other | Admitting: Allergy & Immunology

## 2021-01-19 ENCOUNTER — Encounter (INDEPENDENT_AMBULATORY_CARE_PROVIDER_SITE_OTHER): Payer: Medicare Other | Admitting: Ophthalmology

## 2021-02-03 DIAGNOSIS — H524 Presbyopia: Secondary | ICD-10-CM | POA: Diagnosis not present

## 2021-02-03 DIAGNOSIS — H04123 Dry eye syndrome of bilateral lacrimal glands: Secondary | ICD-10-CM | POA: Diagnosis not present

## 2021-02-03 DIAGNOSIS — H52203 Unspecified astigmatism, bilateral: Secondary | ICD-10-CM | POA: Diagnosis not present

## 2021-02-03 DIAGNOSIS — Z961 Presence of intraocular lens: Secondary | ICD-10-CM | POA: Diagnosis not present

## 2021-02-03 DIAGNOSIS — H5202 Hypermetropia, left eye: Secondary | ICD-10-CM | POA: Diagnosis not present

## 2021-02-03 DIAGNOSIS — H353122 Nonexudative age-related macular degeneration, left eye, intermediate dry stage: Secondary | ICD-10-CM | POA: Diagnosis not present

## 2021-02-05 ENCOUNTER — Ambulatory Visit: Payer: Self-pay

## 2021-02-05 NOTE — Telephone Encounter (Signed)
°  Chief Complaint: sinus congestion Symptoms: congestion, pressure, nasal discharge, cough Frequency: 1 week Pertinent Negatives: Patient denies fever, SOB Disposition: [] ED /[] Urgent Care (no appt availability in office) / [] Appointment(In office/virtual)/ []  [facility]/ [] Home Care/ [] Refused Recommended Disposition /[x]  Mobile Bus/ []  Follow-up with PCP Additional Notes: Pt has been taking OTC meds and nasal rinses for symptoms but not improving. No available appts in office until 02/26/21. Pt was advised to be seen at mobile unit and was given location and directions to get there. Care advice given and pt verbalized understanding. No other questions/concerns noted.      Reason for Disposition  [1] Using nasal washes and pain medicine > 24 hours AND [2] sinus pain (around cheekbone or eye) persists  Answer Assessment - Initial Assessment Questions 1. LOCATION: "Where does it hurt?"      Under eyes and around nasal 2. ONSET: "When did the sinus pain start?"  (e.g., hours, days)      1 week 3. SEVERITY: "How bad is the pain?"   (Scale 1-10; mild, moderate or severe)   - MILD (1-3): doesn't interfere with normal activities    - MODERATE (4-7): interferes with normal activities (e.g., work or school) or awakens from sleep   - SEVERE (8-10): excruciating pain and patient unable to do any normal activities        mild 5. NASAL CONGESTION: "Is the nose blocked?" If Yes, ask: "Can you open it or must you breathe through your mouth?"     yes 6. NASAL DISCHARGE: "Do you have discharge from your nose?" If so ask, "What color?"     Yes, yellow 7. FEVER: "Do you have a fever?" If Yes, ask: "What is it, how was it measured, and when did it start?"      NO 8. OTHER SYMPTOMS: "Do you have any other symptoms?" (e.g., sore throat, cough, earache, difficulty breathing)     Congestion, cough, chest tightness  Protocols used: Sinus Pain or Congestion-A-AH

## 2021-02-19 ENCOUNTER — Ambulatory Visit: Payer: Medicare Other | Admitting: Allergy & Immunology

## 2021-02-19 ENCOUNTER — Encounter: Payer: Self-pay | Admitting: Allergy & Immunology

## 2021-02-19 ENCOUNTER — Other Ambulatory Visit: Payer: Self-pay

## 2021-02-19 VITALS — BP 120/82 | HR 63 | Temp 97.7°F | Resp 16 | Ht 64.0 in | Wt 132.2 lb

## 2021-02-19 DIAGNOSIS — K9049 Malabsorption due to intolerance, not elsewhere classified: Secondary | ICD-10-CM | POA: Diagnosis not present

## 2021-02-19 DIAGNOSIS — D472 Monoclonal gammopathy: Secondary | ICD-10-CM

## 2021-02-19 DIAGNOSIS — J310 Chronic rhinitis: Secondary | ICD-10-CM

## 2021-02-19 MED ORDER — CARBINOXAMINE MALEATE 4 MG PO TABS: 4.0000 mg | ORAL_TABLET | Freq: Four times a day (QID) | ORAL | 5 refills | Status: DC | PRN

## 2021-02-19 NOTE — Progress Notes (Signed)
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  2. Return in about 6 months (around 08/19/2021).   Subjective:   Kimberly Merritt is a 81 y.o. female presenting today for follow up of  Chief Complaint  Patient presents with   Allergic Rhinitis     No issues    Sinus Problem    In novemeber the 3rd had an infusion at [facility] long. She has MRV can later had a cold, then the flu and then another cold. She used generic sudafed to help with pressure.  Her PCP [place] [facility] [place] [facility] [place]     Kimberly Merritt has a history of the following: Patient Active Problem List   Diagnosis Date Noted   Living will in place 11/21/2020   Low serum cortisol level 07/29/2020   Mixed stress and urge urinary incontinence 07/23/2020   Mild cognitive impairment 07/23/2020   Chronic fatigue syndrome 01/16/2020   Diaphragmatic hernia 01/16/2020   Stable angina ([place]) 01/16/2020   Intermediate stage nonexudative age-related macular degeneration of both eyes 01/09/2020   Posterior vitreous detachment of right eye 01/09/2020   Degenerative retinal drusen, both eyes 01/09/2020   Vitreous hemorrhage of left eye ([place]) 01/09/2020   Precordial pain 10/17/2019   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   Estrogen deficiency 02/20/2019   Macular  degeneration of left eye 02/20/2019   Counseled about COVID-19 virus infection/  vaccine 02/20/2019   Osteopenia after menopause 12/07/2018   h/o foot Bone spur- of the calcium type 12/07/2018   Atherosclerotic cardiovascular disease 12/07/2018   Family history of abdominal aortic aneurysm (AAA)- father, elder 12/07/2018   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   Hyperlipidemia 03/09/2018   Immunocompromised patient ([place]) 12/01/2017   Chronic pain of right knee-since age 27 per patient 04/12/2017   Prediabetes 04/12/2017   Vitamin D insufficiency 04/12/2017   CAD (coronary artery disease) 04/05/2017   Hypertension 04/05/2017   Atherosclerosis of aorta ([place]) 04/05/2017   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   History of colonic polyps 04/05/2017   Scoliosis 04/05/2017   Ventricular premature depolarization 04/05/2017   Benign colonic polyp- found in 07/2011- told f/up Dr Watt Climes 5 yrs 04/05/2017   disabled status- since age 62 due to RA in [place] 04/05/2017   Environmental and seasonal allergies 04/05/2017   History of hypoglycemia 04/05/2017   Post-menopausal 04/05/2017   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[place]) 11/26/2016   PVC's (premature ventricular contractions) 05/18/2016   Chronic cough 10/23/2014   Nonischemic cardiomyopathy ([place]) 11/29/2013   Bradycardia 09/25/2013   Lip lesion 08/21/2013   Diverticulosis of colon without hemorrhage 05/02/2013   Paresthesia 06/20/2012   Well adult exam 08/31/2011   Cough 08/10/2011   Sinusitis, acute frontal 02/15/2011   Shoulder pain, right 07/09/2010   LIVER HEMANGIOMA 03/03/2010   MONOCLONAL GAMMOPATHY 03/03/2010  MUSCLE SPASM 03/03/2010   Herpes labialis 12/01/2009   Disorder of bone and cartilage 11/19/2008   HEPATIC CYST 08/02/2007   Allergic rhinitis 04/25/2007   HOARSENESS 04/25/2007   Hypothyroidism (acquired) 08/29/2006   VARICOSE VEINS, LOWER EXTREMITIES 08/29/2006   Gastroesophageal reflux disease 08/29/2006   Generalized osteoarthritis  08/29/2006   LOW BACK PAIN 08/29/2006   Pain in Soft Tissues of Limb 08/29/2006    History obtained from: chart review and patient.  Kimberly Merritt is a 81 y.o. female presenting for a follow up visit.  She was last seen as a new patient in September 2022.  At that time, she underwent testing for chronic rhinitis and was positive only to grass.  We continued with Nasalcrom twice daily after nasal saline rinses and started her on alternating antihistamines as well as Astelin.  Since last visit, she thinks that she has done well. Back in November she had RSV and then she had a number of colds and influenza. She just is unable to get rid of these things. She remains on the cetirizine and the Astelin. She was on levocetirizine, which she did not experience relief from compared to the cetirizine. She does have some postnasal drip with this regimen. She is now trying to get over another cold. She has been using vaporizer which has helped with the cough. She has not required the use of systemic steroid or antibiotics.   She did get IVIG in November and [place] in November. Her infusion takes a "good portion of the day", starting around 11:30 and she was discharged at 5pm. She had to slow it down because she was not tolerating the higher rate of infusion.  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  This is her main manifestation of her symptoms.  Her Christmas was rather lonely.  She stayed at home with her cat who is 28 years old.  She had seen some cousins for Thanksgiving, but she has no other close family nearby.  Otherwise, there have been no changes to her past medical history, surgical history, family history, or social history.    Review of Systems  Constitutional:  Positive for malaise/fatigue. Negative for fever and weight loss.  HENT: Negative.  Negative for congestion, ear discharge and ear pain.   Eyes:  Negative for pain, discharge and redness.   Respiratory:  Negative for cough, sputum production, shortness of breath and wheezing.   Cardiovascular: Negative.  Negative for chest pain and palpitations.  Gastrointestinal:  Negative for abdominal pain, constipation, diarrhea, heartburn, nausea and vomiting.  Skin: Negative.  Negative for itching and rash.  Neurological:  Negative for dizziness and headaches.  Endo/Heme/Allergies:  Negative for environmental allergies. Does not bruise/bleed easily.      Objective:   Blood pressure 120/82, pulse 63, temperature 97.7 F (36.5 C), resp. rate 16, height 5\' 4"  (1.626 m), weight 132 lb 3.2 oz (60 kg), SpO2 98 %. Body mass index is 22.69 kg/m.   Physical Exam:  Physical Exam Vitals reviewed.  Constitutional:      Appearance: She is well-developed.  HENT:     Head: Normocephalic and atraumatic.     Right Ear: Tympanic membrane, ear canal and external ear normal. No drainage, swelling or tenderness. Tympanic membrane is not injected, scarred, erythematous, retracted or bulging.     Left Ear: Tympanic membrane, ear canal and external ear normal. No drainage, swelling or tenderness. Tympanic membrane is not injected, scarred, erythematous, retracted  or bulging.     Nose: Rhinorrhea present. No nasal deformity, septal deviation or mucosal edema.     Right Turbinates: Enlarged, swollen and pale.     Left Turbinates: Enlarged, swollen and pale.     Right Sinus: No maxillary sinus tenderness or frontal sinus tenderness.     Left Sinus: No maxillary sinus tenderness or frontal sinus tenderness.     Mouth/Throat:     Mouth: Mucous membranes are not pale and not dry.     Pharynx: Uvula midline.     Comments: There is some mild cobblestoning present.  Eyes:     General:        Right eye: No discharge.        Left eye: No discharge.     Conjunctiva/sclera: Conjunctivae normal.     Right eye: Right conjunctiva is not injected. No chemosis.    Left eye: Left conjunctiva is not injected. No  chemosis.    Pupils: Pupils are equal, round, and reactive to light.  Cardiovascular:     Rate and Rhythm: Normal rate and regular rhythm.     Heart sounds: Normal heart sounds.  Pulmonary:     Effort: Pulmonary effort is normal. No tachypnea, accessory muscle usage or respiratory distress.     Breath sounds: Normal breath sounds. No wheezing, rhonchi or rales.     Comments: Moving air well in all lung fields.  No increased work of breathing. Chest:     Chest wall: No tenderness.  Lymphadenopathy:     Head:     Right side of head: No submandibular, tonsillar or occipital adenopathy.     Left side of head: No submandibular, tonsillar or occipital adenopathy.     Cervical: No cervical adenopathy.  Skin:    General: Skin is warm.     Capillary Refill: Capillary refill takes less than 2 seconds.     Coloration: Skin is not pale.     Findings: No abrasion, erythema, petechiae or rash. Rash is not papular, urticarial or vesicular.     Comments: No eczematous or urticarial lesions noted.  Neurological:     Mental Status: She is alert.  Psychiatric:        Behavior: Behavior is cooperative.     Diagnostic studies: none       Salvatore Marvel, MD  [facility] [place] [facility] [place]

## 2021-02-19 NOTE — Patient Instructions (Addendum)
1. 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  2. Return in about 6 months (around 08/19/2021).    Please inform us of any Emergency Department visits, hospitalizations, or changes in symptoms. Call us before going to the ED for breathing or allergy symptoms since we might be able to fit you in for a sick visit. Feel free to contact us anytime with any questions, problems, or concerns.  It was a pleasure to see you again today!  Websites that have reliable patient information: 1. American Academy of Asthma, Allergy, and Immunology: www.aaaai.org 2. Food Allergy Research and Education (FARE): foodallergy.org 3. Mothers of Asthmatics: http://www.asthmacommunitynetwork.org 4. American College of Allergy, Asthma, and Immunology: www.acaai.org   COVID-19 Vaccine Information can be found at: ShippingScam.co.uk For questions related to vaccine distribution or appointments, please email [email or web address] or call [phone].   We realize that you might be concerned about having an allergic reaction to the COVID19 vaccines. To help with that concern, WE ARE OFFERING THE COVID19 VACCINES IN OUR OFFICE! Ask the front desk for dates!     Like [place] on [place] and Instagram for our latest updates!      A healthy democracy works best when [employer] participate! Make sure you are registered to vote! If you have moved or changed any of your contact information, you will need to get this updated before voting!  In some cases, you MAY be able to register to vote online: [email or web address]

## 2021-02-23 ENCOUNTER — Telehealth: Payer: Self-pay | Admitting: Allergy & Immunology

## 2021-02-23 NOTE — Telephone Encounter (Signed)
Jillann called in and states that the Carbinoxamine Maleate is not working for her and states her sinus pressure has gotten worse.  Kabella went back to Zyrtec and she found that is working for her and her symptoms started to go away.

## 2021-02-24 NOTE — Telephone Encounter (Signed)
Happy for the update.   Thanks!   Salvatore Marvel, MD [facility] [place] [facility] [place]

## 2021-03-03 ENCOUNTER — Ambulatory Visit (INDEPENDENT_AMBULATORY_CARE_PROVIDER_SITE_OTHER): Payer: Medicare Other | Admitting: Ophthalmology

## 2021-03-03 ENCOUNTER — Other Ambulatory Visit: Payer: Self-pay

## 2021-03-03 ENCOUNTER — Encounter (INDEPENDENT_AMBULATORY_CARE_PROVIDER_SITE_OTHER): Payer: Self-pay | Admitting: Ophthalmology

## 2021-03-03 DIAGNOSIS — H353132 Nonexudative age-related macular degeneration, bilateral, intermediate dry stage: Secondary | ICD-10-CM | POA: Diagnosis not present

## 2021-03-03 NOTE — Assessment & Plan Note (Addendum)
The nature of age--related macular degeneration was discussed with the patient as well as the distinction between dry and wet types. Checking an Amsler Grid daily with advice to return immediately should a distortion develop, was given to the patient. The patient 's smoking status now and in the past was determined and advice based on the AREDS study was provided regarding the consumption of antioxidant supplements. AREDS 2 vitamin formulation was recommended. Consumption of dark leafy vegetables and fresh fruits of various colors was recommended. Treatment modalities for wet macular degeneration particularly the use of intravitreal injections of anti-blood vessel growth factors was discussed with the patient. Avastin, Lucentis, and Eylea are the available options. On occasion, therapy includes the use of photodynamic therapy and thermal laser. Stressed to the patient do not rub eyes.  Patient was advised to check Amsler Grid daily and return immediately if changes are noted. Instructions on using the grid were given to the patient. All patient questions were answered.The nature of age--related macular degeneration was discussed with the patient as well as the distinction between dry and wet types. Checking an Amsler Grid daily with advice to return immediately should a distortion develop, was given to the patient. The patient 's smoking status now and in the past was determined and advice based on the AREDS study was provided regarding the consumption of antioxidant supplements. AREDS 2 vitamin formulation was recommended. Consumption of dark leafy vegetables and fresh fruits of various colors was recommended. Treatment modalities for wet macular degeneration particularly the use of intravitreal injections of anti-blood vessel growth factors was discussed with the patient. Avastin, Lucentis, and Eylea are the available options. On occasion, therapy includes the use of photodynamic therapy and thermal laser.  Stressed to the patient do not rub eyes.  Patient was advised to check Amsler Grid daily and return immediately if changes are noted. Instructions on using the grid were given to the patient. All patient questions were answered.No signs of CNVM clinically or by OCT continue to observe

## 2021-03-03 NOTE — Progress Notes (Signed)
03/03/2021     CHIEF COMPLAINT Patient presents for  Chief Complaint  Patient presents with   Retina Follow Up      HISTORY OF PRESENT ILLNESS: Kimberly Merritt is a 81 y.o. female who presents to the clinic today for:   HPI     Retina Follow Up           Diagnosis: Dry AMD   Laterality: both eyes   Onset: 1 year ago   Severity: mild   Duration: 1 year   Course: gradually worsening         Comments   1 year fu ou and oct  Pt states VA OU stable since last visit. Pt denies FOL, floaters, or ocular pain OU.   Pt states, "Some days my vision is worse than others. I have a lot of problems with dry eye and it makes my vision very blurred. I use Restasis twice a day."        Last edited by Kendra Opitz, COA on 03/03/2021  2:08 PM.      Referring physician: Rutherford Guys, [place],  [place] [address]  HISTORICAL INFORMATION:   Selected notes from the MEDICAL RECORD NUMBER    Lab Results  Component Value Date   HGBA1C 5.1 01/23/2020     CURRENT MEDICATIONS: Current Outpatient Medications (Ophthalmic Drugs)  Medication Sig   RESTASIS 0.05 % ophthalmic emulsion 1 drop 2 (two) times daily.   No current facility-administered medications for this visit. (Ophthalmic Drugs)   Current Outpatient Medications (Other)  Medication Sig   Carbinoxamine Maleate 4 MG TABS Take 1 tablet (4 mg total) by mouth every 6 (six) hours as needed.   carvedilol (COREG) 12.5 MG tablet TAKE 1 TABLET BY MOUTH 2 TIMES DAILY.   cetirizine (ZYRTEC) 10 MG tablet Take 10 mg by mouth daily.   Cholecalciferol (VITAMIN D3) 50 MCG (2000 UT) capsule Take 1 capsule by mouth daily.   cromolyn (NASALCROM) 5.2 MG/ACT nasal spray Place 1 spray into both nostrils 4 (four) times daily.   Glucosamine-Chondroitin 1500-1200 MG/30ML LIQD Take 1 tablet by mouth daily.   losartan (COZAAR) 25 MG tablet TAKE 1 TABLET BY MOUTH DAILY.   pantoprazole (PROTONIX) 40 MG tablet Take 40 mg  by mouth daily.   sucralfate (CARAFATE) 1 g tablet Take 1 g by mouth daily as needed.    SYNTHROID 25 MCG tablet TAKE 1 TABLET BY MOUTH DAILY BEFORE BREAKFAST   No current facility-administered medications for this visit. (Other)      REVIEW OF SYSTEMS: ROS   Negative for: Constitutional, Gastrointestinal, Neurological, Skin, Genitourinary, Musculoskeletal, HENT, Endocrine, Cardiovascular, Eyes, Respiratory, Psychiatric, Allergic/Imm, Heme/Lymph Last edited by Hurman Horn, MD on 03/03/2021  2:48 PM.       ALLERGIES Allergies  Allergen Reactions   Acetaminophen Rash    REACTION: high dose - tremor Per pt - rash on chest and heart palpitations   Alendronate Sodium     Stomach upset.    Codeine     May interfere with heart condition     Diflunisal     Stomach pain.     Doxycycline     n/v   Flexeril [Cyclobenzaprine]     Abdominal pain.     Iodinated Contrast Media Other (See Comments)    Syncope.    Lisinopril Other (See Comments)   Nabumetone     Abdominal pain; dark, tarry stool.     Other Other (  See Comments)   Oxybutynin     Pt does not remember.     Raloxifene     Unknown.     Sulfa Antibiotics Nausea Only    Syncope.     Symbicort [Budesonide-Formoterol Fumarate]     "Jittery-ness."    Tramadol Hcl     Numbness.     Tramadol Hcl Itching    Numbness.     Diclofenac Palpitations    Dark, tarry stool.     Latex Rash    PAST MEDICAL HISTORY Past Medical History:  Diagnosis Date   Allergy    CAD (coronary artery disease)    Nonobstructive. LAD 30% stenosis, small PDA of the circumflex 70% stenosis, right coronary artery 40% stenosis. 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/03/2010   MUSCLE SPASM 03/03/2010   Nonischemic cardiomyopathy ([place])    EF 30%    OSTEOARTHRITIS 08/29/2006   OSTEOPENIA 11/19/2008   PARESTHESIA 03/05/2009   PVC's (premature ventricular contractions)    Scoliosis    VARICOSE VEINS, LOWER EXTREMITIES 08/29/2006   Past Surgical History:  Procedure Laterality Date   ABDOMINAL HYSTERECTOMY  1981   APPENDECTOMY     BACK SURGERY  2010,2006   lumb fusion   BUNIONECTOMY     both   BUNIONECTOMY Right 1985   CARPAL TUNNEL RELEASE     both   CARPAL TUNNEL RELEASE Right 1992   CARPAL TUNNEL RELEASE Left 1992   CATARACT EXTRACTION     both   CATARACT EXTRACTION Right 2008   CATARACT EXTRACTION Left 2008   HAMMER TOE SURGERY Left 1987   HAMMER TOE SURGERY Right 1986   LEFT AND RIGHT HEART CATHETERIZATION WITH CORONARY ANGIOGRAM N/A 10/04/2013   Procedure: LEFT AND RIGHT HEART CATHETERIZATION WITH CORONARY ANGIOGRAM;  Surgeon: Leonie Man, MD;  [facility];  Service: Cardiovascular;  Laterality: N/A;   LUMBAR FUSION  2006   LUMBAR FUSION  2010   OOPHORECTOMY  1967   OVARIAN CYST REMOVAL     ROTATOR CUFF REPAIR Right 2013   SHOULDER ARTHROSCOPY  02/09/2011   Procedure: ARTHROSCOPY SHOULDER;  Surgeon: Hessie Dibble, MD;  Location: [facility];  Service: Orthopedics;  Laterality: Right;  right shoulder arthroscopy subacromial decompression with rotator cuff repair   TONSILLECTOMY      FAMILY HISTORY Family History  Problem Relation Age of Onset   Stroke Mother    Cancer Father    Emphysema Father    Hypertension Other    Breast cancer Neg Hx     SOCIAL HISTORY Social History   Tobacco Use   Smoking status: Never   Smokeless tobacco: Never  Vaping Use   Vaping Use: Never used  Substance Use Topics   Alcohol use: No    Alcohol/week: 0.0 standard drinks   Drug use: No         OPHTHALMIC EXAM:  Base Eye Exam     Visual Acuity (ETDRS)       Right Left   Dist [place] 20/25 20/30 -2   Dist ph [place]  NI         Tonometry (Tonopen, 2:13 PM)       Right Left   Pressure 16 16  Pupils       Pupils Dark Light Shape React APD   Right PERRL 3 2 Round Brisk None   Left PERRL 3 2 Round Brisk None         Visual Fields (Counting fingers)       Left Right    Full Full         Extraocular Movement       Right Left    Full, Ortho Full, Ortho         Neuro/Psych     Oriented x3: Yes   Mood/Affect: Normal         Dilation     Both eyes: 1.0% Mydriacyl, 2.5% Phenylephrine @ 2:12 PM           Slit Lamp and Fundus Exam     External Exam       Right Left   External Normal Normal         Slit Lamp Exam       Right Left   Lids/Lashes Normal Normal   Conjunctiva/Sclera White and quiet White and quiet   Cornea Clear Clear   Anterior Chamber Deep and quiet Deep and quiet   Iris Round and reactive Round and reactive   Lens Centered posterior chamber intraocular lens Centered posterior chamber intraocular lens   Anterior Vitreous Normal Normal         Fundus Exam       Right Left   Posterior Vitreous Posterior vitreous detachment Posterior vitreous detachment   Disc Normal Normal   C/D Ratio 0.2 0.2   Macula Early age related macular degeneration, Hard drusen Soft drusen, Early age related macular degeneration,    Vessels Normal Normal   Periphery Normal Normal            IMAGING AND PROCEDURES  Imaging and Procedures for 03/03/21  OCT, Retina - OU - Both Eyes       Right Eye Quality was good. Scan locations included subfoveal. Central Foveal Thickness: 278. Progression has been stable. Findings include abnormal foveal contour, no SRF.   Left Eye Quality was good. Scan locations included subfoveal. Central Foveal Thickness: 273. Progression has been stable. Findings include abnormal foveal contour, retinal drusen , no IRF, no SRF.   Notes No signs of active CNVM OU by OCT             ASSESSMENT/PLAN:  Intermediate stage nonexudative age-related macular degeneration of both eyes The nature of  age--related macular degeneration was discussed with the patient as well as the distinction between dry and wet types. Checking an Amsler Grid daily with advice to return immediately should a distortion develop, was given to the patient. The patient 's smoking status now and in the past was determined and advice based on the AREDS study was provided regarding the consumption of antioxidant supplements. AREDS 2 vitamin formulation was recommended. Consumption of dark leafy vegetables and fresh fruits of various colors was recommended. Treatment modalities for wet macular degeneration particularly the use of intravitreal injections of anti-blood vessel growth factors was discussed with the patient. Avastin, Lucentis, and Eylea are the available options. On occasion, therapy includes the use of photodynamic therapy and thermal laser. Stressed to the patient do not rub eyes.  Patient was advised to check Amsler Grid daily and return immediately if changes are noted. Instructions on using the grid were given to the patient. All patient questions were answered.The nature of age--related macular degeneration was discussed with the  patient as well as the distinction between dry and wet types. Checking an Amsler Grid daily with advice to return immediately should a distortion develop, was given to the patient. The patient 's smoking status now and in the past was determined and advice based on the AREDS study was provided regarding the consumption of antioxidant supplements. AREDS 2 vitamin formulation was recommended. Consumption of dark leafy vegetables and fresh fruits of various colors was recommended. Treatment modalities for wet macular degeneration particularly the use of intravitreal injections of anti-blood vessel growth factors was discussed with the patient. Avastin, Lucentis, and Eylea are the available options. On occasion, therapy includes the use of photodynamic therapy and thermal laser. Stressed to the  patient do not rub eyes.  Patient was advised to check Amsler Grid daily and return immediately if changes are noted. Instructions on using the grid were given to the patient. All patient questions were answered.No signs of CNVM clinically or by OCT continue to observe     ICD-10-CM   1. Intermediate stage nonexudative age-related macular degeneration of both eyes  H35.3132 OCT, Retina - OU - Both Eyes      1.  Stable overall OU,  2.  No signs of active CNVM OU  3.  Ophthalmic Meds Ordered this visit:  No orders of the defined types were placed in this encounter.      Return in about 1 year (around 03/03/2022) for DILATE OU, COLOR FP, OCT.  There are no Patient Instructions on file for this visit.   Explained the diagnoses, plan, and follow up with the patient and they expressed understanding.  Patient expressed understanding of the importance of proper follow up care.   Clent Demark Jurrell Royster M.D. Diseases & Surgery of the Retina and Vitreous [facility] 03/03/21     Abbreviations: M myopia (nearsighted); A astigmatism; H hyperopia (farsighted); P presbyopia; Mrx spectacle prescription;  CTL contact lenses; OD right eye; OS left eye; OU both eyes  XT exotropia; ET esotropia; PEK punctate epithelial keratitis; PEE punctate epithelial erosions; DES dry eye syndrome; MGD meibomian gland dysfunction; ATs artificial tears; PFAT's preservative free artificial tears; [place] nuclear sclerotic cataract; PSC posterior subcapsular cataract; ERM epi-retinal membrane; PVD posterior vitreous detachment; RD retinal detachment; DM diabetes mellitus; DR diabetic retinopathy; NPDR non-proliferative diabetic retinopathy; PDR proliferative diabetic retinopathy; CSME clinically significant macular edema; DME diabetic macular edema; dbh dot blot hemorrhages; CWS cotton wool spot; POAG primary open angle glaucoma; C/D cup-to-disc ratio; HVF humphrey visual field; GVF goldmann visual field; OCT  optical coherence tomography; IOP intraocular pressure; BRVO Branch retinal vein occlusion; CRVO central retinal vein occlusion; CRAO central retinal artery occlusion; BRAO branch retinal artery occlusion; RT retinal tear; SB scleral buckle; PPV pars plana vitrectomy; VH Vitreous hemorrhage; PRP panretinal laser photocoagulation; IVK intravitreal kenalog; VMT vitreomacular traction; MH Macular hole;  NVD neovascularization of the disc; NVE neovascularization elsewhere; AREDS age related eye disease study; ARMD age related macular degeneration; POAG primary open angle glaucoma; EBMD epithelial/anterior basement membrane dystrophy; ACIOL anterior chamber intraocular lens; IOL intraocular lens; PCIOL posterior chamber intraocular lens; Phaco/IOL phacoemulsification with intraocular lens placement; [place] photorefractive keratectomy; LASIK laser assisted in situ keratomileusis; HTN hypertension; DM diabetes mellitus; COPD chronic obstructive pulmonary disease

## 2021-03-04 ENCOUNTER — Other Ambulatory Visit: Payer: Self-pay | Admitting: Cardiology

## 2021-03-11 DIAGNOSIS — M25512 Pain in left shoulder: Secondary | ICD-10-CM | POA: Diagnosis not present

## 2021-03-11 DIAGNOSIS — M25551 Pain in right hip: Secondary | ICD-10-CM | POA: Diagnosis not present

## 2021-03-12 ENCOUNTER — Other Ambulatory Visit: Payer: Self-pay | Admitting: Orthopaedic Surgery

## 2021-03-12 DIAGNOSIS — M25512 Pain in left shoulder: Secondary | ICD-10-CM

## 2021-03-14 ENCOUNTER — Other Ambulatory Visit: Payer: Self-pay

## 2021-03-14 ENCOUNTER — Ambulatory Visit
Admission: RE | Admit: 2021-03-14 | Discharge: 2021-03-14 | Disposition: A | Discharge status: Home / Self Care | Payer: Medicare Other | Source: Ambulatory Visit | Attending: Orthopaedic Surgery | Admitting: Orthopaedic Surgery

## 2021-03-14 DIAGNOSIS — M25412 Effusion, left shoulder: Secondary | ICD-10-CM | POA: Diagnosis not present

## 2021-03-14 DIAGNOSIS — M19012 Primary osteoarthritis, left shoulder: Secondary | ICD-10-CM | POA: Diagnosis not present

## 2021-03-14 DIAGNOSIS — M25512 Pain in left shoulder: Secondary | ICD-10-CM

## 2021-03-18 DIAGNOSIS — M67912 Unspecified disorder of synovium and tendon, left shoulder: Secondary | ICD-10-CM | POA: Diagnosis not present

## 2021-03-18 DIAGNOSIS — M25512 Pain in left shoulder: Secondary | ICD-10-CM | POA: Diagnosis not present

## 2021-03-18 DIAGNOSIS — M19012 Primary osteoarthritis, left shoulder: Secondary | ICD-10-CM | POA: Diagnosis not present

## 2021-03-20 DIAGNOSIS — M7989 Other specified soft tissue disorders: Secondary | ICD-10-CM

## 2021-03-24 ENCOUNTER — Ambulatory Visit: Payer: Medicare Other | Admitting: Internal Medicine

## 2021-03-26 ENCOUNTER — Ambulatory Visit: Payer: Medicare Other | Admitting: Internal Medicine

## 2021-03-30 ENCOUNTER — Telehealth: Payer: Self-pay | Admitting: Cardiology

## 2021-03-30 NOTE — Telephone Encounter (Signed)
° ° °  Pre-operative Risk Assessment    Patient Name: Kimberly Merritt  DOB: 1940-10-06 MRN: [id number]      Request for Surgical Clearance    Procedure:  left should scop  Date of Surgery:  Clearance 04/30/21                                 Surgeon:  Dr. Melrose Nakayama [facility] or [facility] Name:  [facility]  Phone number:  [phone] Fax number:  [phone]   Type of Clearance Requested:   - Medical    Type of Anesthesia:  General    Additional requests/questions:   per [facility], pt is not sure if she is on any blood thinners but they will defer to cards if pt needs to held any meds prior procedure   Signed, Selinda Orion   03/30/2021, 4:22 PM

## 2021-03-31 ENCOUNTER — Ambulatory Visit: Payer: Medicare Other | Attending: Internal Medicine | Admitting: Internal Medicine

## 2021-03-31 ENCOUNTER — Encounter: Payer: Self-pay | Admitting: Internal Medicine

## 2021-03-31 VITALS — BP 169/92 | HR 64 | Resp 16 | Wt 134.2 lb

## 2021-03-31 DIAGNOSIS — I10 Essential (primary) hypertension: Secondary | ICD-10-CM | POA: Diagnosis not present

## 2021-03-31 DIAGNOSIS — I2510 Atherosclerotic heart disease of native coronary artery without angina pectoris: Secondary | ICD-10-CM | POA: Diagnosis not present

## 2021-03-31 DIAGNOSIS — M19012 Primary osteoarthritis, left shoulder: Secondary | ICD-10-CM

## 2021-03-31 DIAGNOSIS — E039 Hypothyroidism, unspecified: Secondary | ICD-10-CM

## 2021-03-31 NOTE — Progress Notes (Signed)
Patient ID: AKELA POCIUS, female    DOB: 1940/06/23  MRN: [id number]  CC: chronic ds management  Subjective: Kimberly Merritt is a 81 y.o. 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  On Coreg and Losartan Med Adherence: [x]  Yes    []  No Medication side effects: []  Yes    [x]  No Adherence with salt restriction: [x]  Yes    []  No Home Monitoring?: [x]  Yes    []  No Monitoring Frequency: daily.  She has a log with her Home BP results range: starting from this a.m going back 5 days: 115/62, 126/62, 116/62, 123/70, 126/58.  Afternoon readings: 103/59, 120/68, 114/59.  She uses an automated arm cuff SOB? [x]  Yes when pollen count is high   Chest Pain?: []  Yes    [x]  No Leg swelling?: []  Yes    [x]  No Headaches?: []  Yes    [x]  No Dizziness? []  Yes    []  No Comments: ate some salted salmon patties for breakfast this morning.  Will be having arthroscopic surgery on LT surgery 04/30/2020 by Dr. Shelly Coss.  Recent MRI 03/14/2021 showed arthritis and bone spurs. Had steroid inj to the shoulder but did not help much  Received call from Cardio PA. From their stand point she is good to move forward with the surgery. Pt reports no problems with anesthesia in past Compliant with Synthroid.  Last TSH was 05/2021 Had IVIG infusion 01/2021.  Will see Dr. Chryl Heck in 05/2021.  Dr. Gwenlyn Perking has retired.     Patient Active Problem List   Diagnosis Date Noted   Living will in place 11/21/2020   Low serum cortisol level 07/29/2020   Mixed stress and urge urinary incontinence 07/23/2020   Mild cognitive impairment 07/23/2020   Chronic fatigue syndrome 01/16/2020    Diaphragmatic hernia 01/16/2020   Stable angina ([place]) 01/16/2020   Intermediate stage nonexudative age-related macular degeneration of both eyes 01/09/2020   Posterior vitreous detachment of right eye 01/09/2020   Degenerative retinal drusen, both eyes 01/09/2020   Vitreous hemorrhage of left eye ([place]) 01/09/2020   Precordial pain 10/17/2019   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   Estrogen deficiency 02/20/2019   Macular degeneration of left eye 02/20/2019   Counseled about COVID-19 virus infection/  vaccine 02/20/2019   Osteopenia after menopause 12/07/2018   h/o foot Bone spur- of the calcium type 12/07/2018   Atherosclerotic cardiovascular disease 12/07/2018   Family history of abdominal aortic aneurysm (AAA)- father, elder 12/07/2018   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   Hyperlipidemia 03/09/2018   Immunocompromised patient ([place]) 12/01/2017   Chronic pain of right knee-since age 71 per patient 04/12/2017   Prediabetes 04/12/2017   Vitamin D insufficiency 04/12/2017   CAD (coronary artery disease) 04/05/2017   Hypertension 04/05/2017   Atherosclerosis of aorta ([place]) 04/05/2017   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   History of colonic polyps 04/05/2017   Scoliosis 04/05/2017   Ventricular premature depolarization 04/05/2017   Benign colonic polyp- found in 07/2011- told f/up Dr Watt Climes 5 yrs 04/05/2017   disabled status- since age 20 due to RA in [place] 04/05/2017   Environmental and seasonal allergies 04/05/2017   History of hypoglycemia 04/05/2017   Post-menopausal 04/05/2017   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[place]) 11/26/2016  PVC's (premature ventricular contractions) 05/18/2016   Chronic cough 10/23/2014   Nonischemic cardiomyopathy ([place]) 11/29/2013   Bradycardia 09/25/2013   Lip lesion 08/21/2013   Diverticulosis of colon without hemorrhage 05/02/2013   Paresthesia 06/20/2012   Well adult exam 08/31/2011   Cough 08/10/2011   Sinusitis, acute frontal 02/15/2011    Shoulder pain, right 07/09/2010   LIVER HEMANGIOMA 03/03/2010   MONOCLONAL GAMMOPATHY 03/03/2010   MUSCLE SPASM 03/03/2010   Herpes labialis 12/01/2009   Disorder of bone and cartilage 11/19/2008   HEPATIC CYST 08/02/2007   Allergic rhinitis 04/25/2007   HOARSENESS 04/25/2007   Hypothyroidism (acquired) 08/29/2006   VARICOSE VEINS, LOWER EXTREMITIES 08/29/2006   Gastroesophageal reflux disease 08/29/2006   Generalized osteoarthritis 08/29/2006   LOW BACK PAIN 08/29/2006   Pain in Soft Tissues of Limb 08/29/2006     Current Outpatient Medications on File Prior to Visit  Medication Sig Dispense Refill   Carbinoxamine Maleate 4 MG TABS Take 1 tablet (4 mg total) by mouth every 6 (six) hours as needed. 56 tablet 5   carvedilol (COREG) 12.5 MG tablet TAKE 1 TABLET BY MOUTH 2 TIMES DAILY. 180 tablet 3   cetirizine (ZYRTEC) 10 MG tablet Take 10 mg by mouth daily.     Cholecalciferol (VITAMIN D3) 50 MCG (2000 UT) capsule Take 1 capsule by mouth daily.     cromolyn (NASALCROM) 5.2 MG/ACT nasal spray Place 1 spray into both nostrils 4 (four) times daily.     Glucosamine-Chondroitin 1500-1200 MG/30ML LIQD Take 1 tablet by mouth daily.     losartan (COZAAR) 25 MG tablet TAKE 1 TABLET BY MOUTH DAILY. 90 tablet 2   pantoprazole (PROTONIX) 40 MG tablet Take 40 mg by mouth daily.     RESTASIS 0.05 % ophthalmic emulsion 1 drop 2 (two) times daily.     sucralfate (CARAFATE) 1 g tablet Take 1 g by mouth daily as needed.      SYNTHROID 25 MCG tablet TAKE 1 TABLET BY MOUTH DAILY BEFORE BREAKFAST 90 tablet 1   No current facility-administered medications on file prior to visit.    Allergies  Allergen Reactions   Acetaminophen Rash    REACTION: high dose - tremor Per pt - rash on chest and heart palpitations   Alendronate Sodium     Stomach upset.    Codeine     May interfere with heart condition     Diflunisal     Stomach pain.     Doxycycline     n/v   Flexeril [Cyclobenzaprine]      Abdominal pain.     Iodinated Contrast Media Other (See Comments)    Syncope.    Lisinopril Other (See Comments)   Nabumetone     Abdominal pain; dark, tarry stool.     Other Other (See Comments)   Oxybutynin     Pt does not remember.     Raloxifene     Unknown.     Sulfa Antibiotics Nausea Only    Syncope.     Symbicort [Budesonide-Formoterol Fumarate]     "Jittery-ness."    Tramadol Hcl     Numbness.     Tramadol Hcl Itching    Numbness.     Diclofenac Palpitations    Dark, tarry stool.     Latex Rash    Social History   Socioeconomic History   Marital status: Widowed    Spouse name: Not on file   Number of children: 0   Years of education: Not  on file   Highest education level: Not on file  Occupational History   Occupation: Retired  Tobacco Use   Smoking status: Never   Smokeless tobacco: Never  Vaping Use   Vaping Use: Never used  Substance and Sexual Activity   Alcohol use: No    Alcohol/week: 0.0 standard drinks   Drug use: No   Sexual activity: Not Currently  Other Topics Concern   Not on file  Social History Narrative   Lives alone.     Social Determinants of Health   Financial Resource Strain: Not on file  Food Insecurity: Not on file  Transportation Needs: Not on file  Physical Activity: Not on file  Stress: Not on file  Social Connections: Not on file  Intimate Partner Violence: Not on file    Family History  Problem Relation Age of Onset   Stroke Mother    Cancer Father    Emphysema Father    Hypertension Other    Breast cancer Neg Hx     Past Surgical History:  Procedure Laterality Date   ABDOMINAL HYSTERECTOMY  1981   APPENDECTOMY     BACK SURGERY  2010,2006   lumb fusion   BUNIONECTOMY     both   BUNIONECTOMY Right [place]     both   CARPAL TUNNEL RELEASE Right 1992   CARPAL TUNNEL RELEASE Left 1992   CATARACT EXTRACTION     both   CATARACT EXTRACTION Right 2008   CATARACT EXTRACTION Left 2008    HAMMER TOE SURGERY Left 1987   HAMMER TOE SURGERY Right 1986   LEFT AND RIGHT HEART CATHETERIZATION WITH CORONARY ANGIOGRAM N/A 10/04/2013   Procedure: LEFT AND RIGHT HEART CATHETERIZATION WITH CORONARY ANGIOGRAM;  Surgeon: Leonie Man, MD;  Location: [facility] CATH LAB;  Service: Cardiovascular;  Laterality: N/A;   LUMBAR FUSION  2006   LUMBAR FUSION  2010   OOPHORECTOMY  1967   OVARIAN CYST REMOVAL     ROTATOR CUFF REPAIR Right 2013   SHOULDER ARTHROSCOPY  02/09/2011   Procedure: ARTHROSCOPY SHOULDER;  Surgeon: Hessie Dibble, MD;  Location: [place];  Service: Orthopedics;  Laterality: Right;  right shoulder arthroscopy subacromial decompression with rotator cuff repair   TONSILLECTOMY      ROS: Review of Systems Negative except as stated above  PHYSICAL EXAM: BP (!) 169/92    Pulse 64    Resp 16    Wt 134 lb 3.2 oz (60.9 kg)    SpO2 98%    BMI 23.04 kg/m   Physical Exam  General appearance - alert, well appearing, pleasant elderly caucasian female and in no distress Mental status - normal mood, behavior, speech, dress, motor activity, and thought processes Neck - bilateral symmetric anterior adenopathy Chest - clear to auscultation, no wheezes, rales or rhonchi, symmetric air entry Heart - normal rate, regular rhythm, normal S1, S2, no murmurs, rubs, clicks or gallops Extremities - peripheral pulses normal, no pedal edema, no clubbing or cyanosis MSK: enlargement of PIP and DIP jts of hands  CMP Latest Ref Rng & Units 11/19/2020 05/21/2020 05/14/2020  Glucose 70 - 99 mg/dL 80 108(H) 119(H)  BUN 8 - 23 mg/dL 16 20 19   Creatinine 0.44 - 1.00 mg/dL 0.84 0.93 0.92  Sodium 135 - 145 mmol/L 139 136 129(L)  Potassium 3.5 - 5.1 mmol/L 4.3 4.8 5.3(H)  Chloride 98 - 111 mmol/L 102 95(L) 91(L)  CO2 22 - 32 mmol/L 29  23 22  Calcium 8.9 - 10.3 mg/dL 9.2 9.1 9.0  Total Protein 6.5 - 8.1 g/dL 7.0 7.0 -  Total Bilirubin 0.3 - 1.2 mg/dL 0.4 0.3 -  Alkaline Phos 38 - 126 U/L 70  91 -  AST 15 - 41 U/L 20 13 -  ALT 0 - 44 U/L 17 14 -   Lipid Panel     Component Value Date/Time   CHOL 185 01/23/2020 0822   TRIG 37 01/23/2020 0822   HDL 68 01/23/2020 0822   CHOLHDL 2.7 01/23/2020 0822   CHOLHDL 2 06/08/2011 0830   VLDL 6.4 06/08/2011 0830   LDLCALC 109 (H) 01/23/2020 0822   LDLDIRECT 106 (H) 10/23/2018 1110   LDLDIRECT 131.0 02/05/2008 0931    CBC    Component Value Date/Time   WBC 7.1 11/19/2020 1019   RBC 4.02 11/19/2020 1019   HGB 12.0 11/19/2020 1019   HGB 11.5 05/14/2020 1520   HGB 12.2 11/04/2016 1115   HCT 37.5 11/19/2020 1019   HCT 35.8 05/14/2020 1520   HCT 36.8 11/04/2016 1115   PLT 345 11/19/2020 1019   PLT 380 05/14/2020 1520   MCV 93.3 11/19/2020 1019   MCV 94 05/14/2020 1520   MCV 94.4 11/04/2016 1115   MCH 29.9 11/19/2020 1019   MCHC 32.0 11/19/2020 1019   RDW 12.2 11/19/2020 1019   RDW 10.9 (L) 05/14/2020 1520   RDW 12.5 11/04/2016 1115   LYMPHSABS 3.0 11/19/2020 1019   LYMPHSABS 3.5 (H) 01/23/2020 0822   LYMPHSABS 2.6 11/04/2016 1115   MONOABS 1.1 (H) 11/19/2020 1019   MONOABS 0.9 11/04/2016 1115   EOSABS 0.1 11/19/2020 1019   EOSABS 0.1 01/23/2020 0822   BASOSABS 0.1 11/19/2020 1019   BASOSABS 0.1 01/23/2020 0822   BASOSABS 0.0 11/04/2016 1115    ASSESSMENT AND PLAN: 1. Essential hypertension Patient's blood pressure today is much above goal compared to her home readings.  Increased reading today may be due to dietary indiscretions with salty foods this morning. Recommend that she continues to check blood pressure at home with goal being 130/80 or lower.  I will have her follow-up with clinical pharmacist in 2 to 3 weeks for recheck.  Advised to bring her home blood pressure device with her to that appointment.  2. CAD in native artery Stable.  Continue carvedilol  3. Osteoarthritis of left shoulder, unspecified osteoarthritis type Plan for arthroscopic surgery in 1 month.  We will aim to get her blood pressure better  before then.  4. Hypothyroidism, unspecified type Continue Synthroid.  Check TSH today. - TSH    Patient was given the opportunity to ask questions.  Patient verbalized understanding of the plan and was able to repeat key elements of the plan.   This documentation was completed using Radio producer.  Any transcriptional errors are unintentional.  Orders Placed This Encounter  Procedures   TSH     Requested Prescriptions    No prescriptions requested or ordered in this encounter    Return in about 4 months (around 07/29/2021) for Appt with [facility] in 2 wks for BP check for BP check.  Karle Plumber, MD, FACP

## 2021-03-31 NOTE — Telephone Encounter (Signed)
Notes faxed to surgeon. This phone note will be removed from the preop pool. Richardson Dopp, PA-C  03/31/2021 12:07 PM

## 2021-03-31 NOTE — Telephone Encounter (Signed)
° °  Name: Kimberly Merritt DOB: [birth year]  MRN: [id number]  Primary Cardiologist: Minus Breeding, MD  Chart reviewed as part of pre-operative protocol coverage.   Patient profile Nonischemic cardiomyopathy EF 30% >>improved to normal PVCs (monitor 10/2013: 27% burden) Nonobstructive coronary artery disease, small vessel disease Hypertension Mild tricuspid regurgitation  Prior CV studies Echocardiogram 08/14/2019: EF 60-65, mild MR, mild TR Myoview 07/20/2019: No ischemia or infarction Cardiac catheterization 10/04/2013: Proximal LAD 30; small L PDA 70 (not amenable to PCI); RCA mid 40  She was last seen by Dr. Percival Spanish in September 2022.  RCRI:  Perioperative Risk of Major Cardiac Event is (%): 0.4 (low risk) DASI:  Functional Capacity in METs is: 4.4 (functional status is fair )  Patient was contacted 03/31/2021 in reference to pre-operative risk assessment for pending surgery as outlined below.    Since last seen, Kimberly Merritt has done well without chest discomfort, syncope.  She did have RSV in January and was somewhat short of breath with this.  Her breathing has improved since then.  Of note, she does not take any antiplatelet medications or anticoagulation.  Recommendations: Based on ACC/AHA guidelines, the patient is at acceptable risk for the planned procedure and may proceed without further cardiovascular testing.    Please call with questions. Richardson Dopp, PA-C 03/31/2021, 12:06 PM

## 2021-03-31 NOTE — Patient Instructions (Signed)
We will have you follow-up with our clinical pharmacist in 2 weeks for repeat blood pressure check.  Continue to monitor your blood pressure and bring your readings with you.  I would also recommend bringing your home blood pressure device with you to that visit.

## 2021-04-01 LAB — TSH: TSH: 1.63 u[IU]/mL (ref 0.450–4.500)

## 2021-04-02 ENCOUNTER — Telehealth: Payer: Self-pay

## 2021-04-02 NOTE — Telephone Encounter (Signed)
Contacted pt to go over lab results pt is aware and doesn't have any questions or concerns 

## 2021-04-13 ENCOUNTER — Other Ambulatory Visit: Payer: Self-pay | Admitting: Physician Assistant

## 2021-04-13 ENCOUNTER — Other Ambulatory Visit: Payer: Self-pay | Admitting: Cardiology

## 2021-04-13 DIAGNOSIS — E039 Hypothyroidism, unspecified: Secondary | ICD-10-CM

## 2021-04-14 NOTE — Telephone Encounter (Signed)
Requested Prescriptions  ?Pending Prescriptions Disp Refills  ?? SYNTHROID 25 MCG tablet [Pharmacy Med Name: SYNTHROID 25 MCG TABLET 25 Tablet] 90 tablet 1  ?  Sig: TAKE 1 TABLET BY MOUTH DAILY BEFORE BREAKFAST  ?  ? Endocrinology:  Hypothyroid Agents Passed - 04/13/2021 10:31 AM  ?  ?  Passed - TSH in normal range and within 360 days  ?  TSH  ?Date Value Ref Range Status  ?03/31/2021 1.630 0.450 - 4.500 uIU/mL Final  ?   ?  ?  Passed - Valid encounter within last 12 months  ?  Recent Outpatient Visits   ?      ? 2 weeks ago Essential hypertension  ? [place] Ladell Pier, MD  ? 4 months ago Essential hypertension  ? [place] Ladell Pier, MD  ? 8 months ago Encounter for [employer] annual wellness exam  ? [place], [place]  ? 11 months ago Fatigue, unspecified type  ? [place] Ladell Pier, MD  ? 11 months ago Hypothyroidism, unspecified type  ? [place], [place], [place]  ?  ?  ?Future Appointments   ?        ? In 1 week Daisy Blossom, Jarome [place], [place]  ? In 3 months Ladell Pier, MD [place]  ? In 4 months Valentina Shaggy, MD [facility] [place]  ?  ? ?  ?  ?  ? ?

## 2021-04-15 DIAGNOSIS — M7061 Trochanteric bursitis, right hip: Secondary | ICD-10-CM | POA: Diagnosis not present

## 2021-04-23 ENCOUNTER — Ambulatory Visit: Payer: Medicare Other | Attending: Internal Medicine | Admitting: Pharmacist

## 2021-04-23 ENCOUNTER — Other Ambulatory Visit: Payer: Self-pay

## 2021-04-23 VITALS — BP 118/66

## 2021-04-23 DIAGNOSIS — I10 Essential (primary) hypertension: Secondary | ICD-10-CM

## 2021-04-23 NOTE — Progress Notes (Signed)
? ?  S:    ? ?No chief complaint on file. ? ? ?Kimberly Merritt is a 81 y.o. female who presents for hypertension evaluation, education, and management. 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 Patient was referred and last seen by Primary Care Provider, Dr. Wynetta Emery, on 03/31/21. ? ?Today, She arrives in good spirits and presents without assistance. Denies dizziness, headache, blurred vision, swelling.  ? ?Patient reports hypertension is longstanding.  ? ?Family/Social history:  ?-Fhx: stroke, cancer, HTN ?-Tobacco: never smoker ?-Alcohol: denies use  ? ?Medication adherence reported. Patient has taken BP medications today.  ? ?Current antihypertensives include:  carvedilol 12.5 mg BID, losartan 25 mg daily  ? ?Reported home BP readings:  ?-Brings log of her BP medication  ? ?Patient reported dietary habits:  ?-Reports partial compliance with sodium restriction ? ?Patient-reported exercise habits:  ?-Walks daily  ? ?O:  ?Today's Vitals  ? 04/23/21 1547  ?BP: 118/66  ? ?There is no height or weight on file to calculate BMI. ? ? ?Last 3 Office BP readings: ?BP Readings from Last 3 Encounters:  ?04/23/21 118/66  ?03/31/21 (!) 169/92  ?02/19/21 120/82  ? ?BMET ?   ?Component Value Date/Time  ? NA 139 11/19/2020 1019  ? NA 136 05/21/2020 1357  ? NA 136 11/04/2016 1115  ? K 4.3 11/19/2020 1019  ? K 4.8 11/04/2016 1115  ? CL 102 11/19/2020 1019  ? CO2 29 11/19/2020 1019  ? CO2 27 11/04/2016 1115  ? GLUCOSE 80 11/19/2020 1019  ? GLUCOSE 95 11/04/2016 1115  ? BUN 16 11/19/2020 1019  ? BUN 20 05/21/2020 1357  ? BUN 16.7 11/04/2016 1115  ? CREATININE 0.84 11/19/2020 1019  ? CREATININE 0.8 11/04/2016 1115  ? CALCIUM 9.2 11/19/2020 1019  ? CALCIUM 9.0 11/04/2016 1115  ? GFRNONAA >60 11/19/2020 1019  ? GFRNONAA 69 10/02/2013 1234   ? GFRAA 74 01/23/2020 0822  ? GFRAA 80 10/02/2013 1234  ? ? ?Renal function: ?CrCl cannot be calculated (Patient's most recent lab result is older than the maximum 21 days allowed.). ? ?Clinical ASCVD: No  ?The ASCVD Risk score (Arnett DK, et al., 2019) failed to calculate for the following reasons: ?  The 2019 ASCVD risk score is only valid for ages 36 to 30 ? ? ?A/P: ?Hypertension diagnosed currently controlled on current medications. BP goal < 130/80 mmHg. Medication adherence appears appropriate.  ?-Continued current regimen.  ?-F/u labs ordered - none ?-Counseled on lifestyle modifications for blood pressure control including reduced dietary sodium, increased exercise, adequate sleep. ?-Encouraged patient to check BP at home and bring log of readings to next visit. Counseled on proper use of home BP cuff.  ? ?Results reviewed and written information provided. Patient verbalized understanding of treatment plan. Total time in face-to-face counseling 20 minutes.  ? ?F/u clinic visit in with PCP.  ? ?Benard Halsted, PharmD, BCACP, CPP ?Clinical Pharmacist ?[facility] ?[phone] ? ? ?

## 2021-04-24 ENCOUNTER — Encounter: Payer: Self-pay | Admitting: Pharmacist

## 2021-04-30 DIAGNOSIS — G8918 Other acute postprocedural pain: Secondary | ICD-10-CM | POA: Diagnosis not present

## 2021-04-30 DIAGNOSIS — M7532 Calcific tendinitis of left shoulder: Secondary | ICD-10-CM | POA: Diagnosis not present

## 2021-04-30 DIAGNOSIS — M7542 Impingement syndrome of left shoulder: Secondary | ICD-10-CM | POA: Diagnosis not present

## 2021-04-30 DIAGNOSIS — M19012 Primary osteoarthritis, left shoulder: Secondary | ICD-10-CM | POA: Diagnosis not present

## 2021-04-30 DIAGNOSIS — M67814 Other specified disorders of tendon, left shoulder: Secondary | ICD-10-CM | POA: Diagnosis not present

## 2021-04-30 DIAGNOSIS — M898X2 Other specified disorders of bone, upper arm: Secondary | ICD-10-CM | POA: Diagnosis not present

## 2021-05-06 ENCOUNTER — Other Ambulatory Visit: Payer: Medicare Other

## 2021-05-12 DIAGNOSIS — M6281 Muscle weakness (generalized): Secondary | ICD-10-CM | POA: Diagnosis not present

## 2021-05-12 DIAGNOSIS — M25612 Stiffness of left shoulder, not elsewhere classified: Secondary | ICD-10-CM | POA: Diagnosis not present

## 2021-05-13 ENCOUNTER — Ambulatory Visit: Payer: Medicare Other | Admitting: Hematology and Oncology

## 2021-05-19 DIAGNOSIS — M25612 Stiffness of left shoulder, not elsewhere classified: Secondary | ICD-10-CM | POA: Diagnosis not present

## 2021-05-19 DIAGNOSIS — M6281 Muscle weakness (generalized): Secondary | ICD-10-CM | POA: Diagnosis not present

## 2021-05-21 ENCOUNTER — Other Ambulatory Visit: Payer: Self-pay

## 2021-05-21 DIAGNOSIS — D472 Monoclonal gammopathy: Secondary | ICD-10-CM

## 2021-05-21 DIAGNOSIS — M25612 Stiffness of left shoulder, not elsewhere classified: Secondary | ICD-10-CM | POA: Diagnosis not present

## 2021-05-21 DIAGNOSIS — M6281 Muscle weakness (generalized): Secondary | ICD-10-CM | POA: Diagnosis not present

## 2021-05-25 ENCOUNTER — Other Ambulatory Visit: Payer: Self-pay

## 2021-05-25 ENCOUNTER — Inpatient Hospital Stay: Payer: Medicare Other | Attending: Hematology and Oncology

## 2021-05-25 DIAGNOSIS — I509 Heart failure, unspecified: Secondary | ICD-10-CM | POA: Insufficient documentation

## 2021-05-25 DIAGNOSIS — D801 Nonfamilial hypogammaglobulinemia: Secondary | ICD-10-CM | POA: Diagnosis not present

## 2021-05-25 DIAGNOSIS — D472 Monoclonal gammopathy: Secondary | ICD-10-CM | POA: Insufficient documentation

## 2021-05-25 DIAGNOSIS — I428 Other cardiomyopathies: Secondary | ICD-10-CM | POA: Insufficient documentation

## 2021-05-25 LAB — CMP (CANCER CENTER ONLY)
ALT: 12 U/L (ref 0–44)
AST: 14 U/L — ABNORMAL LOW (ref 15–41)
Albumin: 3.8 g/dL (ref 3.5–5.0)
Alkaline Phosphatase: 69 U/L (ref 38–126)
Anion gap: 5 (ref 5–15)
BUN: 21 mg/dL (ref 8–23)
CO2: 30 mmol/L (ref 22–32)
Calcium: 9 mg/dL (ref 8.9–10.3)
Chloride: 100 mmol/L (ref 98–111)
Creatinine: 0.83 mg/dL (ref 0.44–1.00)
GFR, Estimated: >60 mL/min (ref >60)
Glucose, Bld: 91 mg/dL (ref 70–99)
Potassium: 4.4 mmol/L (ref 3.5–5.1)
Sodium: 135 mmol/L (ref 135–145)
Total Bilirubin: 0.4 mg/dL (ref 0.3–1.2)
Total Protein: 7 g/dL (ref 6.5–8.1)

## 2021-05-25 LAB — CBC WITH DIFFERENTIAL (CANCER CENTER ONLY)
Abs Immature Granulocytes: 0.01 10*3/uL (ref 0.00–0.07)
Basophils Absolute: 0.1 10*3/uL (ref 0.0–0.1)
Basophils Relative: 1 %
Eosinophils Absolute: 0.1 10*3/uL (ref 0.0–0.5)
Eosinophils Relative: 1 %
HCT: 36.9 % (ref 36.0–46.0)
Hemoglobin: 11.9 g/dL — ABNORMAL LOW (ref 12.0–15.0)
Immature Granulocytes: 0 %
Lymphocytes Relative: 44 %
Lymphs Abs: 3.5 10*3/uL (ref 0.7–4.0)
MCH: 30.5 pg (ref 26.0–34.0)
MCHC: 32.2 g/dL (ref 30.0–36.0)
MCV: 94.6 fL (ref 80.0–100.0)
Monocytes Absolute: 1.1 10*3/uL — ABNORMAL HIGH (ref 0.1–1.0)
Monocytes Relative: 14 %
Neutro Abs: 3.2 10*3/uL (ref 1.7–7.7)
Neutrophils Relative %: 40 %
Platelet Count: 324 10*3/uL (ref 150–400)
RBC: 3.9 MIL/uL (ref 3.87–5.11)
RDW: 11.9 % (ref 11.5–15.5)
WBC Count: 8 10*3/uL (ref 4.0–10.5)
nRBC: 0 % (ref 0.0–0.2)

## 2021-05-25 LAB — SAVE SMEAR(SSMR), FOR PROVIDER SLIDE REVIEW

## 2021-05-26 DIAGNOSIS — M6281 Muscle weakness (generalized): Secondary | ICD-10-CM | POA: Diagnosis not present

## 2021-05-26 DIAGNOSIS — M25612 Stiffness of left shoulder, not elsewhere classified: Secondary | ICD-10-CM | POA: Diagnosis not present

## 2021-05-27 LAB — MULTIPLE MYELOMA PANEL, SERUM
Albumin SerPl Elph-Mcnc: 3.5 g/dL (ref 2.9–4.4)
Albumin/Glob SerPl: 1.2 (ref 0.7–1.7)
Alpha 1: 0.2 g/dL (ref 0.0–0.4)
Alpha2 Glob SerPl Elph-Mcnc: 0.6 g/dL (ref 0.4–1.0)
B-Globulin SerPl Elph-Mcnc: 1.8 g/dL — ABNORMAL HIGH (ref 0.7–1.3)
Gamma Glob SerPl Elph-Mcnc: 0.3 g/dL — ABNORMAL LOW (ref 0.4–1.8)
Globulin, Total: 3 g/dL (ref 2.2–3.9)
IgA: 11 mg/dL — ABNORMAL LOW (ref 64–422)
IgG (Immunoglobin G), Serum: 372 mg/dL — ABNORMAL LOW (ref 586–1602)
IgM (Immunoglobulin M), Srm: 1867 mg/dL — ABNORMAL HIGH (ref 26–217)
M Protein SerPl Elph-Mcnc: 1 g/dL — ABNORMAL HIGH
Total Protein ELP: 6.5 g/dL (ref 6.0–8.5)

## 2021-05-29 ENCOUNTER — Encounter: Payer: Self-pay | Admitting: Hematology and Oncology

## 2021-05-29 ENCOUNTER — Inpatient Hospital Stay: Payer: Medicare Other

## 2021-05-29 ENCOUNTER — Inpatient Hospital Stay: Payer: Medicare Other | Admitting: Hematology and Oncology

## 2021-05-29 ENCOUNTER — Other Ambulatory Visit: Payer: Self-pay

## 2021-05-29 VITALS — BP 147/81 | HR 67 | Temp 98.1°F | Wt 129.9 lb

## 2021-05-29 DIAGNOSIS — D472 Monoclonal gammopathy: Secondary | ICD-10-CM | POA: Diagnosis not present

## 2021-05-29 DIAGNOSIS — I428 Other cardiomyopathies: Secondary | ICD-10-CM | POA: Diagnosis not present

## 2021-05-29 DIAGNOSIS — I509 Heart failure, unspecified: Secondary | ICD-10-CM | POA: Diagnosis not present

## 2021-05-29 DIAGNOSIS — D801 Nonfamilial hypogammaglobulinemia: Secondary | ICD-10-CM

## 2021-05-29 NOTE — Progress Notes (Signed)
?[facility]  ?[phone] [phone]  ? ? ? ?ID: Kimberly Merritt DOB: 02-Sep-1940  MR#: [id number]  WCB#:[id number] ? ?Patient Care Team: ?Ladell Pier, MD as PCP - General (Internal Medicine) ?Kimberly Breeding, MD as PCP - Cardiology (Cardiology) ?Melrose Nakayama, MD as Attending Physician (Orthopedic Surgery) ?Magrinat, Virgie Dad, MD (Inactive) (Hematology and Oncology) ?Clarene Essex, MD (Gastroenterology) ?Rutherford Guys, MD as Attending Physician (Ophthalmology) ?Kimberly Gamma, MD as Consulting Physician (Neurosurgery) ?Kimberly Breeding, MD as Consulting Physician (Cardiology) ?Levy Sjogren, MD as Referring Physician (Dermatology) ?Irine Seal, MD as Attending Physician (Urology) ?Mosetta Anis, MD as Referring Physician (Allergy) ?Elam Dutch, MD (Inactive) as Consulting Physician (Vascular Surgery) ?Rankin, Clent Demark, MD as Consulting Physician (Ophthalmology) ?OTHER MD: Kimberly Breeding MD, Marshell Garfinkel MD ? ?CHIEF COMPLAINT: IgM M-GUS/ low-grade lymphoproliferative process ? ?CURRENT TREATMENT: IVIG as needed; Ev[place] ? ? 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  ?She denies any new complaints except for ongoing arthritis and related issues.  She is also very worried about catching COVID since she cannot get the Evushield any more. ?She denies any recent infections or hospitalizations.  She notices some sore throat today but cannot differentiate if this is from the allergies.  No new bone pains.  She has constipation at baseline.  No change in bowel habits or urinary habits otherwise. ?Rest of the pertinent 10 point ROS reviewed and negative ? A detailed review of systems was otherwise stable ? ? COVID 19 VACCINATION STATUS: fully vaccinated [employer]), with booster 10/2019 ? ? ?HISTORY PRESENT ILLNESS:: ? from the earlier summary note: ? ?"Kimberly Merritt" Blunt is a 82 y/o [place] woman I evaluated  originally in October of 2004 for a monoclonal gammopathy. At that time she had a white cell count of 7.2, hemoglobin 12.8, MCV 94.5, and platelets 298,000. Protein workup showed an IgM kappa paraprotein of 0.6 g, with a total IgG slightly depressed at 556, but a normal IgA at 82. Because an IgM paraprotein can be associated with a low grade lymphoproliferative process (Waldenstr?m's macroglobulinemia) Kimberly Merritt underwent bone marrow biopsy 11/27/2002, which showed ([id number]) a normal cellular marrow with trilineage hematopoiesis and 5% plasma cells. The peripheral blood film showed no significant abnormalities and on the biopsy para-spicular lymphoid aggregates were not noted. 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 ? ?The patient was followed with observation alone through May of 2009, with no evidence of progression in her IgM kappa clone. As of 06/03/2007 her total IgM spike was 0.83, her IgG was 490 and her IgA was 36. The patient was released from followup here at that time. ? ?More recently, as the 10 year anniversary of her original bone marrow biopsy past, Dr. Dorann Lodge of referred the patient back for reassessment and possible bone marrow rebiopsy. Kimberly Merritt was scheduled here for lab work 09/25/2013. However when she had her blood drawn she complained of some shortness of breath and her pulse was checked. It was 33. The patient was referred to the emergency room where she was evaluated by Dr. Percival Spanish who felt the patient's true heart rate was higher (she has ectopic beats which generated a week pulse and may have been missed) since in the emergency room EKG her rate was 98 with normal sinus rhythm. Troponins were negative. BNP was slightly elevated. ? ?Dr. Percival Spanish set the patient up for an echocardiogram 09/27/2013 and this showed an ejection fraction of 30-35%. There was  diffuse hypokinesis. She underwent right and left heart catheterization 10/04/2013 which  showed no significant obstructions. The study did confirm an ejection fraction of roughly 35% with global hypokinesis. She is followed by Dr. Percival Spanish for her new diagnosis of non-ischemic cardiomyopathy. ? ?Her subsequent history is as detailed below ? ? ?PAST MEDICAL HISTORY: ?Past Medical History:  ?Diagnosis Date  ? Allergy   ? CAD (coronary artery disease)   ? Nonobstructive. LAD 30% stenosis, small PDA of the circumflex 70% stenosis, right coronary artery 40% stenosis. Catheterization September 2015  ? DEPRESSION 08/29/2006  ? Diverticulosis   ? GERD 08/29/2006  ? HEPATIC CYST 08/02/2007  ? Hypertension   ? HYPOTHYROIDISM 08/29/2006  ? LIVER HEMANGIOMA 03/03/2010  ? LOW BACK PAIN 08/29/2006  ? 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 MUSCLE SPASM 03/03/2010  ? Nonischemic cardiomyopathy ([place])   ? EF 30%  ? OSTEOARTHRITIS 08/29/2006  ? OSTEOPENIA 11/19/2008  ? PARESTHESIA 03/05/2009  ? PVC's (premature ventricular contractions)   ? Scoliosis   ? VARICOSE VEINS, LOWER EXTREMITIES 08/29/2006  ? ? ?PAST SURGICAL HISTORY: ?Past Surgical History:  ?Procedure Laterality Date  ? ABDOMINAL HYSTERECTOMY  1981  ? APPENDECTOMY    ? BACK SURGERY  04/09/2008  ? lumb fusion  ? BUNIONECTOMY    ? both  ? BUNIONECTOMY Right 1985  ? CARPAL TUNNEL RELEASE    ? both  ? CARPAL TUNNEL RELEASE Right 1992  ? CARPAL TUNNEL RELEASE Left 1992  ? CATARACT EXTRACTION    ? both  ? CATARACT EXTRACTION Right 04/10/06  ? CATARACT EXTRACTION Left Apr 10, 2006  ? HAMMER TOE SURGERY Left 1987  ? HAMMER TOE SURGERY Right 1986  ? LEFT AND RIGHT HEART CATHETERIZATION WITH CORONARY ANGIOGRAM N/A 10/04/2013  ? Procedure: LEFT AND RIGHT HEART CATHETERIZATION WITH CORONARY ANGIOGRAM;  Surgeon: Leonie Man, MD;  Location: [facility] CATH [facility];  Service: Cardiovascular;  Laterality: N/A;  ? LUMBAR FUSION  2004-04-09  ? LUMBAR FUSION  2008/04/09  ? OOPHORECTOMY  1967  ? OVARIAN CYST REMOVAL    ? ROTATOR CUFF REPAIR Right 2011/04/10  ? SHOULDER ARTHROSCOPY  02/09/2011  ? Procedure: ARTHROSCOPY SHOULDER;  Surgeon:  Hessie Dibble, MD;  Location: [facility];  Service: Orthopedics;  Laterality: Right;  right shoulder arthroscopy subacromial decompression with rotator cuff repair  ? TONSILLECTOMY    ? ? ?FAMILY HISTORY ?Family History  ?Problem Relation Age of Onset  ? Stroke Mother   ? Cancer Father   ? Emphysema Father   ? Hypertension Other   ? Breast cancer Neg Hx   ? the patient's father died at the age of 34 from lung cancer in the setting of tobacco abuse. [name] mother died at the age of 50 following a stroke. The patient had one brother, who is severely retarded and died from pneumonia at the age of 18. There were no sisters. There is no history of blood problems or cancer in the family to the patient's knowledge ? ? ?GYNECOLOGIC HISTORY:  ?No LMP recorded. Patient is postmenopausal. ?Menarche age 21. The patient is GX P0. She stopped having periods in 04/10/1978 and took hormone replacement for almost 20 years. ? ? 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 Her husband died in April 10, 1991 from a myocardial infarction.  She tells me she has no family.  She [place] by herself, [place], with one cat for company. ?  ? ADVANCED DIRECTIVES: Her healthcare power  of attorney is Charm Barges who can be reached at [phone] (cell), or [phone] (home).  Also if Baldo Ash had an acute problem she would want [place] to contact her neighbor and third cousin Andrey Cota at [phone]  ? ? ?HEALTH MAINTENANCE: ?Social History  ? ?Tobacco Use  ? Smoking status: Never  ? Smokeless tobacco: Never  ?Vaping Use  ? Vaping Use: Never used  ?Substance Use Topics  ? Alcohol use: No  ?  Alcohol/week: 0.0 standard drinks  ? Drug use: No  ? ? ? Colonoscopy: 2014 ? PAP: 2013 ? Bone density: 11/2018, -2.4 ? Lipid panel: ? ?Allergies  ?Allergen Reactions  ? Acetaminophen Rash  ?  REACTION: high dose - tremor ?Per pt - rash on chest and heart palpitations  ? Alendronate Sodium   ?   Stomach upset.   ? Codeine   ?  May interfere with heart condition    ? Diflunisal   ?  Stomach pain.    ? Doxycycline   ?  n/v  ? Flexeril [Cyclobenzaprine]   ?  Abdominal pain.    ? Iodinated Contrast Media O

## 2021-06-01 LAB — KAPPA/LAMBDA LIGHT CHAINS
Kappa free light chain: 17.8 mg/L (ref 3.3–19.4)
Kappa, lambda light chain ratio: 4.94 — ABNORMAL HIGH (ref 0.26–1.65)
Lambda free light chains: 3.6 mg/L — ABNORMAL LOW (ref 5.7–26.3)

## 2021-06-02 DIAGNOSIS — M6281 Muscle weakness (generalized): Secondary | ICD-10-CM | POA: Diagnosis not present

## 2021-06-02 DIAGNOSIS — M25612 Stiffness of left shoulder, not elsewhere classified: Secondary | ICD-10-CM | POA: Diagnosis not present

## 2021-06-04 DIAGNOSIS — M25612 Stiffness of left shoulder, not elsewhere classified: Secondary | ICD-10-CM | POA: Diagnosis not present

## 2021-06-04 DIAGNOSIS — M6281 Muscle weakness (generalized): Secondary | ICD-10-CM | POA: Diagnosis not present

## 2021-06-08 DIAGNOSIS — M6281 Muscle weakness (generalized): Secondary | ICD-10-CM | POA: Diagnosis not present

## 2021-06-08 DIAGNOSIS — M25612 Stiffness of left shoulder, not elsewhere classified: Secondary | ICD-10-CM | POA: Diagnosis not present

## 2021-06-10 DIAGNOSIS — M6281 Muscle weakness (generalized): Secondary | ICD-10-CM | POA: Diagnosis not present

## 2021-06-10 DIAGNOSIS — M25612 Stiffness of left shoulder, not elsewhere classified: Secondary | ICD-10-CM | POA: Diagnosis not present

## 2021-06-16 DIAGNOSIS — M6281 Muscle weakness (generalized): Secondary | ICD-10-CM | POA: Diagnosis not present

## 2021-06-16 DIAGNOSIS — M25612 Stiffness of left shoulder, not elsewhere classified: Secondary | ICD-10-CM | POA: Diagnosis not present

## 2021-06-16 IMAGING — CR DG CHEST 2V
2 series · 2 of 2 positions shown · non-contrast
Comparison: Radiograph 12/16/2016

CLINICAL DATA: Fever, congestion, and productive cough. Evaluate
for pneumonia.

EXAM:
CHEST - 2 VIEW

[w chest pa]
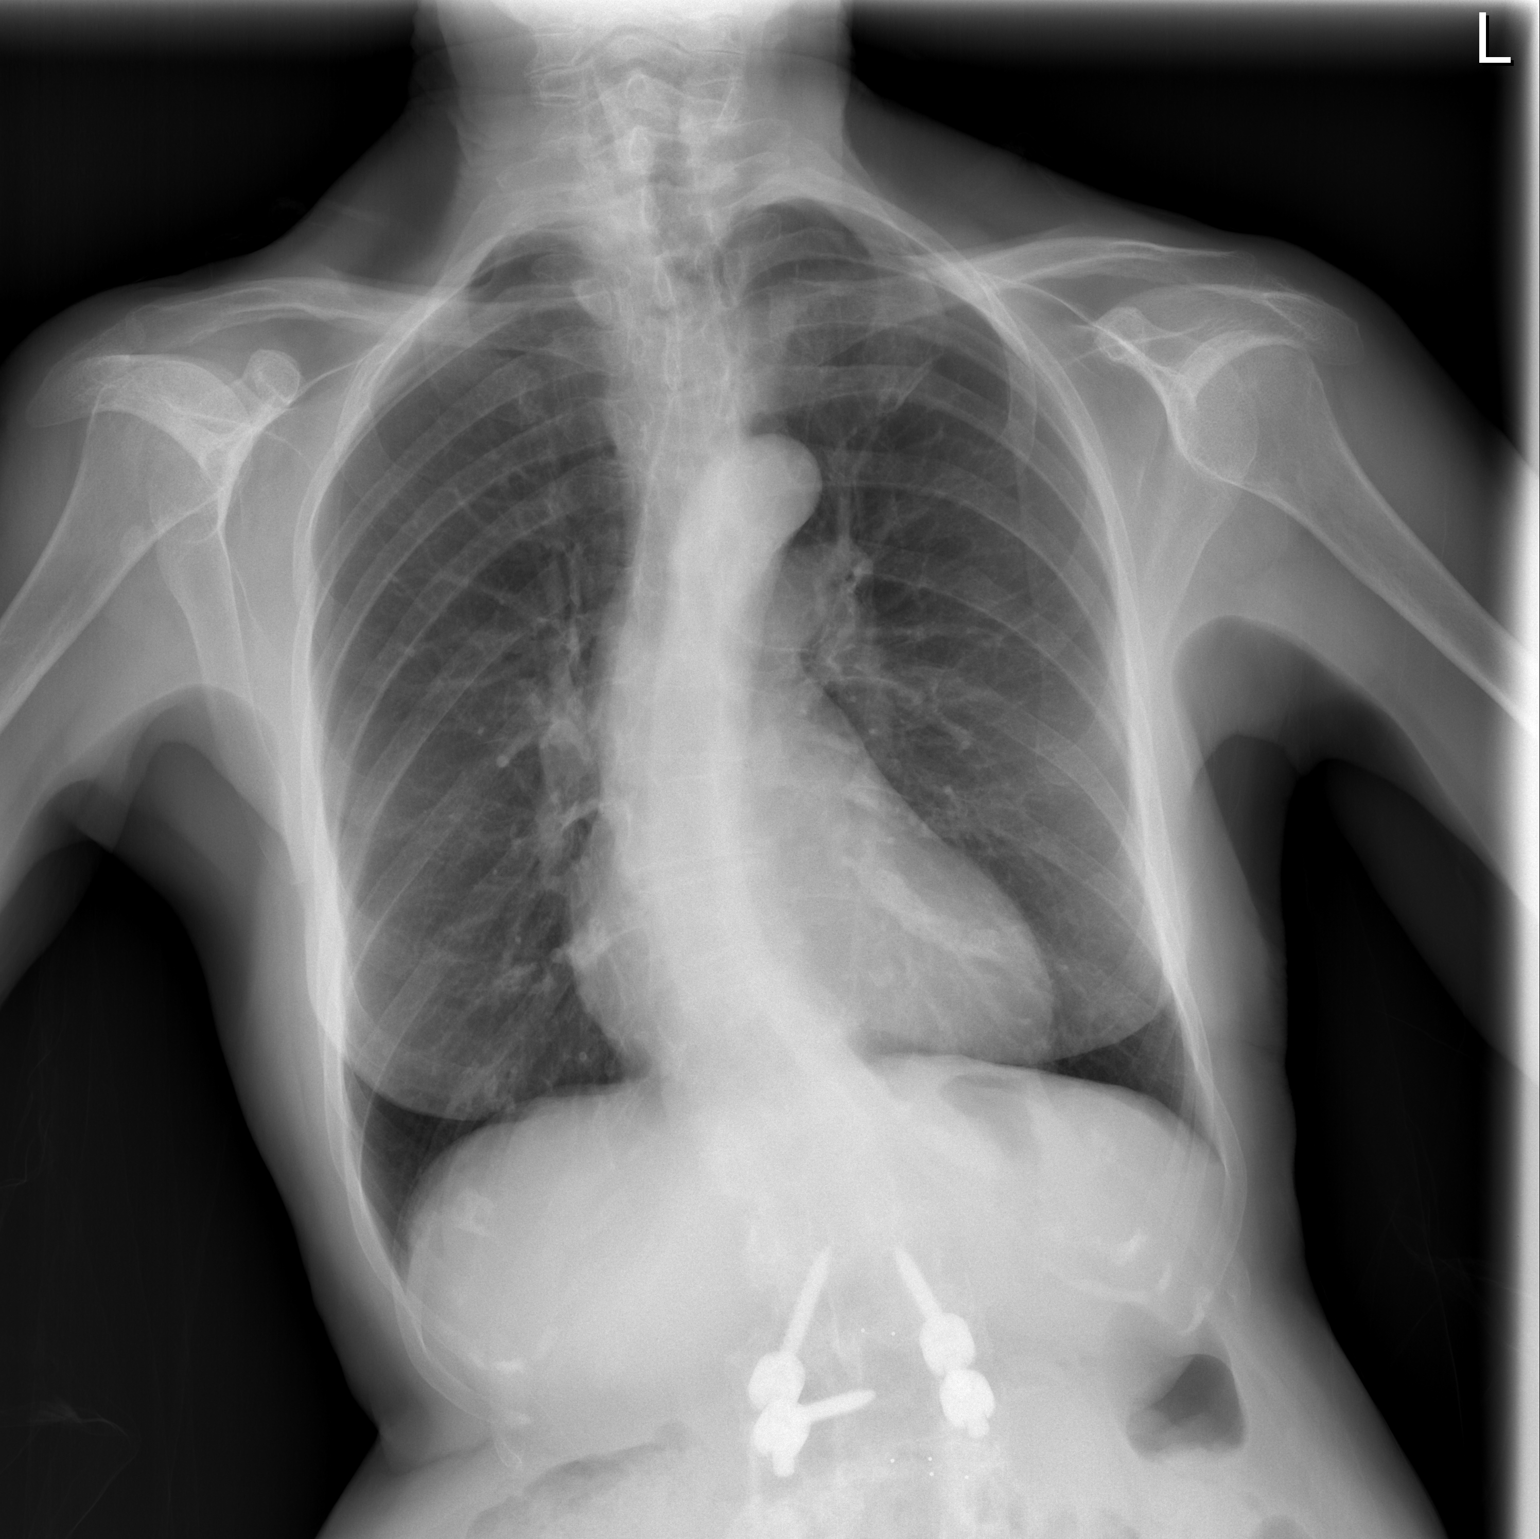

[w chest lat]
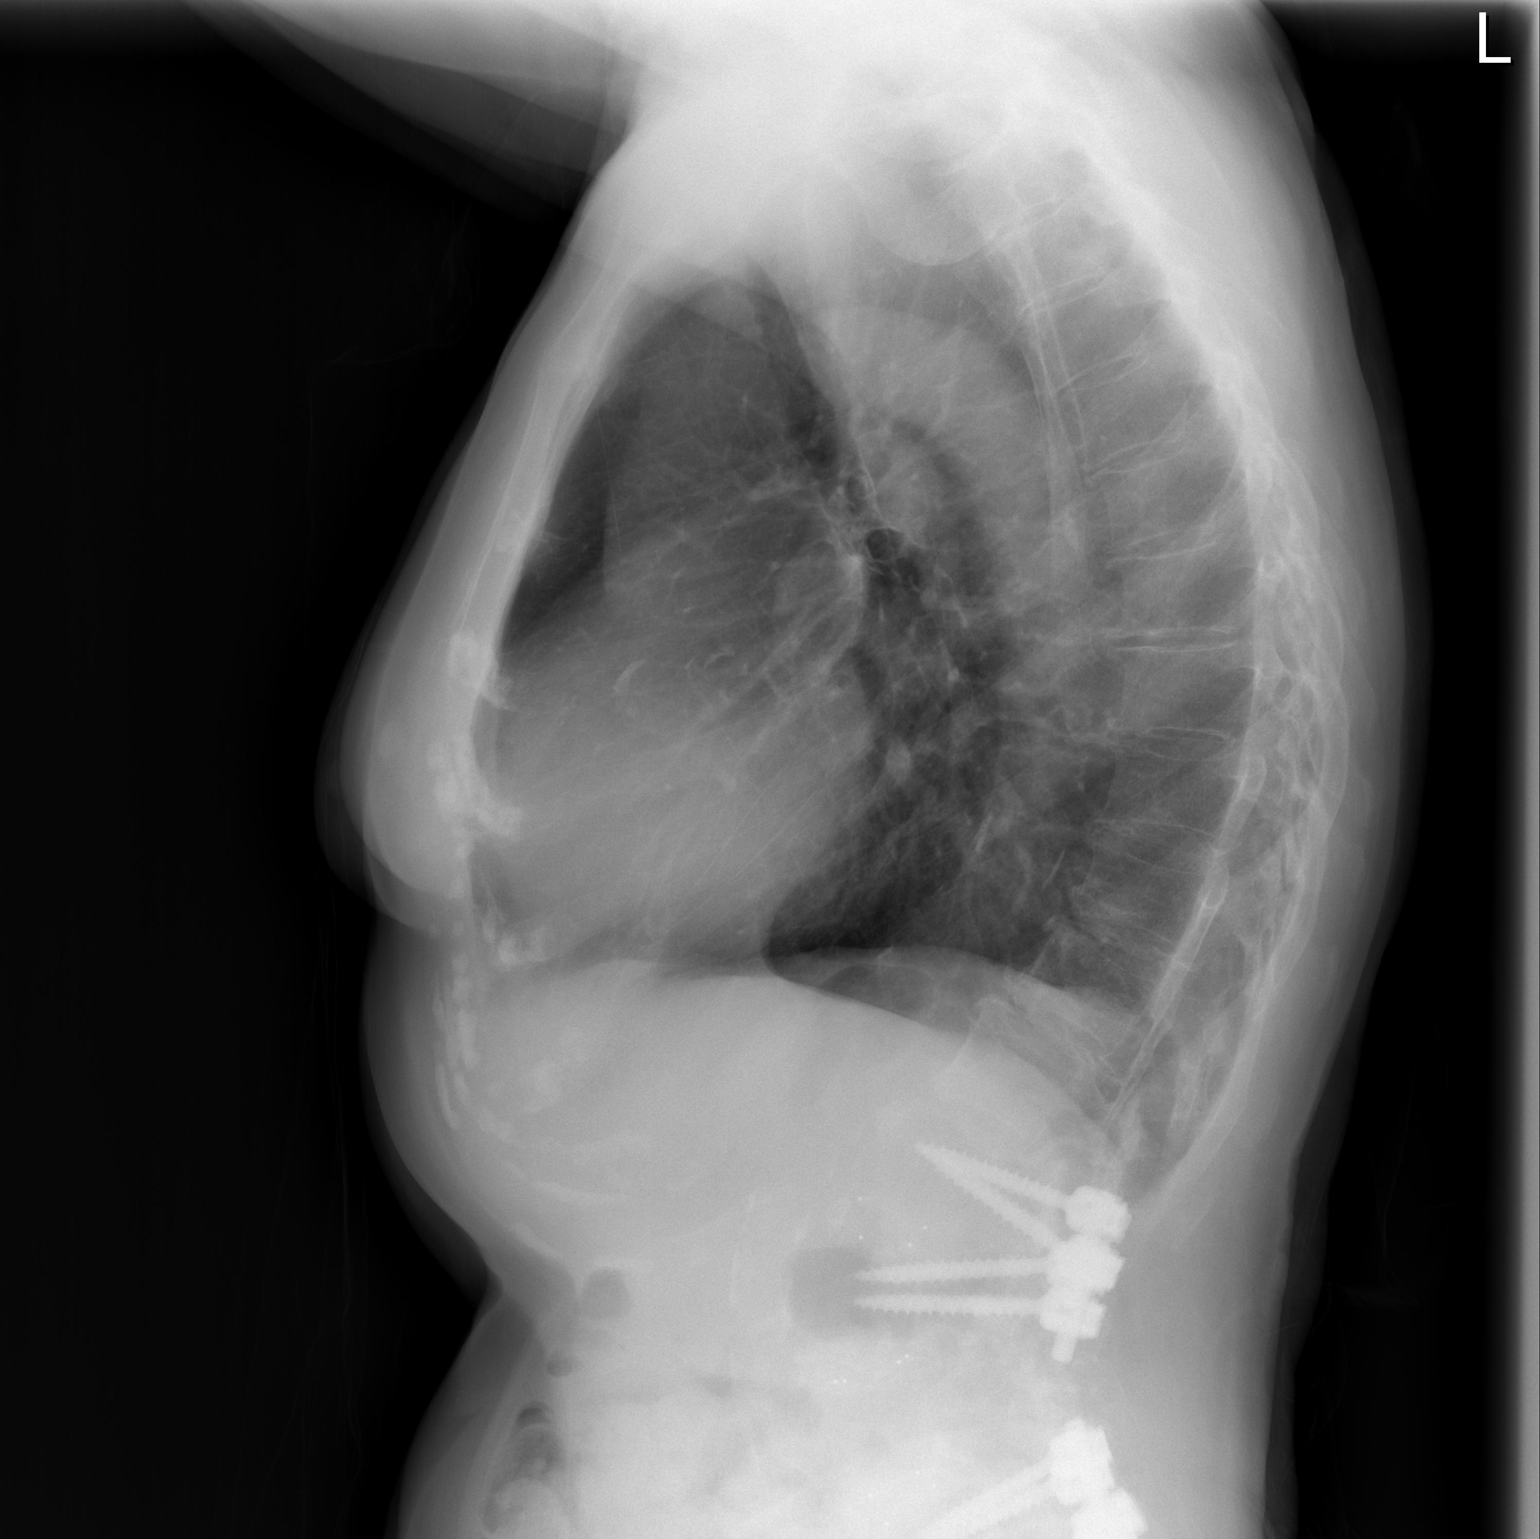

[2 of 2 positions shown; findings below may reference images not displayed]

FINDINGS: The cardiomediastinal contours are normal. The lungs are clear.
Pulmonary vasculature is normal. No consolidation, pleural effusion,
or pneumothorax. No acute osseous abnormalities are seen. Scoliotic
curvature of the thoracolumbar spine, progressive scoliosis since
2716. Surgical hardware in the lumbar spine is partially included.
Bones are under mineralized.
IMPRESSION: 1. No acute chest findings.  Specifically, no evidence of pneumonia.
2. Progressive thoracolumbar scoliosis since [DATE].

## 2021-06-18 DIAGNOSIS — M25612 Stiffness of left shoulder, not elsewhere classified: Secondary | ICD-10-CM | POA: Diagnosis not present

## 2021-06-18 DIAGNOSIS — M6281 Muscle weakness (generalized): Secondary | ICD-10-CM | POA: Diagnosis not present

## 2021-06-23 DIAGNOSIS — M25612 Stiffness of left shoulder, not elsewhere classified: Secondary | ICD-10-CM | POA: Diagnosis not present

## 2021-06-23 DIAGNOSIS — M6281 Muscle weakness (generalized): Secondary | ICD-10-CM | POA: Diagnosis not present

## 2021-06-25 DIAGNOSIS — M25612 Stiffness of left shoulder, not elsewhere classified: Secondary | ICD-10-CM | POA: Diagnosis not present

## 2021-06-25 DIAGNOSIS — M6281 Muscle weakness (generalized): Secondary | ICD-10-CM | POA: Diagnosis not present

## 2021-06-30 DIAGNOSIS — M6281 Muscle weakness (generalized): Secondary | ICD-10-CM | POA: Diagnosis not present

## 2021-06-30 DIAGNOSIS — M25612 Stiffness of left shoulder, not elsewhere classified: Secondary | ICD-10-CM | POA: Diagnosis not present

## 2021-07-02 DIAGNOSIS — M25612 Stiffness of left shoulder, not elsewhere classified: Secondary | ICD-10-CM | POA: Diagnosis not present

## 2021-07-02 DIAGNOSIS — M6281 Muscle weakness (generalized): Secondary | ICD-10-CM | POA: Diagnosis not present

## 2021-07-09 DIAGNOSIS — M25612 Stiffness of left shoulder, not elsewhere classified: Secondary | ICD-10-CM | POA: Diagnosis not present

## 2021-07-09 DIAGNOSIS — M6281 Muscle weakness (generalized): Secondary | ICD-10-CM | POA: Diagnosis not present

## 2021-07-14 DIAGNOSIS — M6281 Muscle weakness (generalized): Secondary | ICD-10-CM | POA: Diagnosis not present

## 2021-07-14 DIAGNOSIS — M25612 Stiffness of left shoulder, not elsewhere classified: Secondary | ICD-10-CM | POA: Diagnosis not present

## 2021-07-16 DIAGNOSIS — M25612 Stiffness of left shoulder, not elsewhere classified: Secondary | ICD-10-CM | POA: Diagnosis not present

## 2021-07-16 DIAGNOSIS — M6281 Muscle weakness (generalized): Secondary | ICD-10-CM | POA: Diagnosis not present

## 2021-07-20 DIAGNOSIS — M25612 Stiffness of left shoulder, not elsewhere classified: Secondary | ICD-10-CM | POA: Diagnosis not present

## 2021-07-20 DIAGNOSIS — M6281 Muscle weakness (generalized): Secondary | ICD-10-CM | POA: Diagnosis not present

## 2021-07-21 ENCOUNTER — Ambulatory Visit: Payer: Medicare Other | Admitting: Family Medicine

## 2021-07-22 DIAGNOSIS — M25612 Stiffness of left shoulder, not elsewhere classified: Secondary | ICD-10-CM | POA: Diagnosis not present

## 2021-07-22 DIAGNOSIS — M6281 Muscle weakness (generalized): Secondary | ICD-10-CM | POA: Diagnosis not present

## 2021-07-29 DIAGNOSIS — M25612 Stiffness of left shoulder, not elsewhere classified: Secondary | ICD-10-CM | POA: Diagnosis not present

## 2021-07-29 DIAGNOSIS — M6281 Muscle weakness (generalized): Secondary | ICD-10-CM | POA: Diagnosis not present

## 2021-07-30 ENCOUNTER — Ambulatory Visit (INDEPENDENT_AMBULATORY_CARE_PROVIDER_SITE_OTHER): Payer: Medicare Other | Admitting: Family Medicine

## 2021-07-30 ENCOUNTER — Ambulatory Visit: Payer: Medicare Other | Admitting: Internal Medicine

## 2021-07-30 ENCOUNTER — Encounter: Payer: Self-pay | Admitting: Family Medicine

## 2021-07-30 VITALS — BP 142/72 | HR 67 | Temp 97.4°F | Ht 63.5 in | Wt 131.4 lb

## 2021-07-30 DIAGNOSIS — E039 Hypothyroidism, unspecified: Secondary | ICD-10-CM | POA: Diagnosis not present

## 2021-07-30 DIAGNOSIS — K5901 Slow transit constipation: Secondary | ICD-10-CM | POA: Diagnosis not present

## 2021-07-30 NOTE — Progress Notes (Signed)
New Patient Office Visit  Subjective    Patient ID: Kimberly Merritt, female    DOB: 19-Apr-1940  Age: 81 y.o. MRN: [id number]  CC:  Chief Complaint  Patient presents with   Establish Care    Np est care. Experiencing constipation off and on for x2 weeks used dulcolax for symptoms. Not fasting.    HPI Kimberly Merritt presents to establish care By way of transfer.  History of hypertension with cardiomyopathy and nonobstructive coronary artery disease.  Seeing cardiology.  History of reflux disease that has been responsive to Protonix.  History of hypothyroidism is not treated low-dose Synthroid.  Last TSH was 1.6 this past April.  History of monoclonal gammopathy with myeloproliferative disease.  She reports intermittent constipation.  It has been difficult for her to exercise because of the current air quality.  Otherwise [place].  Admits that she could do better with fiber intake.  She uses Dulcolax on occasion for constipation.  [place] alone [place].  She has no surviving relatives.  Next-door neighbor is her emergency contact.  Outpatient Encounter Medications as of 07/30/2021  Medication Sig   carvedilol (COREG) 12.5 MG tablet TAKE 1 TABLET BY MOUTH 2 TIMES DAILY.   cetirizine (ZYRTEC) 10 MG tablet Take 10 mg by mouth daily.   Cholecalciferol (VITAMIN D3) 50 MCG (2000 UT) capsule Take 1 capsule by mouth daily.   cromolyn (NASALCROM) 5.2 MG/ACT nasal spray Place 1 spray into both nostrils 4 (four) times daily.   Glucosamine-Chondroitin 1500-1200 MG/30ML LIQD Take 1 tablet by mouth daily.   losartan (COZAAR) 25 MG tablet TAKE 1 TABLET BY MOUTH DAILY.   pantoprazole (PROTONIX) 40 MG tablet Take 40 mg by mouth daily.   RESTASIS 0.05 % ophthalmic emulsion 1 drop 2 (two) times daily.   sucralfate (CARAFATE) 1 g tablet Take 1 g by mouth daily as needed.    SYNTHROID 25 MCG tablet TAKE 1 TABLET BY MOUTH DAILY BEFORE BREAKFAST   Carbinoxamine Maleate 4 MG  TABS Take 1 tablet (4 mg total) by mouth every 6 (six) hours as needed.   No facility-administered encounter medications on file as of 07/30/2021.    Past Medical History:  Diagnosis Date   Allergy    CAD (coronary artery disease)    Nonobstructive. LAD 30% stenosis, small PDA of the circumflex 70% stenosis, right coronary artery 40% stenosis. 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/03/2010   MUSCLE SPASM 03/03/2010   Nonischemic cardiomyopathy ([place])    EF 30%   OSTEOARTHRITIS 08/29/2006   OSTEOPENIA 11/19/2008   PARESTHESIA 03/05/2009   PVC's (premature ventricular contractions)    Scoliosis    VARICOSE VEINS, LOWER EXTREMITIES 08/29/2006    Past Surgical History:  Procedure Laterality Date   ABDOMINAL HYSTERECTOMY  1981   APPENDECTOMY     BACK SURGERY  2010,2006   lumb fusion   BUNIONECTOMY     both   BUNIONECTOMY Right 1985   CARPAL TUNNEL RELEASE     both   CARPAL TUNNEL RELEASE Right 1992   CARPAL TUNNEL RELEASE Left 1992   CATARACT EXTRACTION     both   CATARACT EXTRACTION Right 2008   CATARACT EXTRACTION Left 2008   HAMMER TOE SURGERY Left  1987   HAMMER TOE SURGERY Right 1986   LEFT AND RIGHT HEART CATHETERIZATION WITH CORONARY ANGIOGRAM N/A 10/04/2013   Procedure: LEFT AND RIGHT HEART CATHETERIZATION WITH CORONARY ANGIOGRAM;  Surgeon: Leonie Man, MD;  Location: [facility] CATH LAB;  Service: Cardiovascular;  Laterality: N/A;   LUMBAR FUSION  2006   LUMBAR FUSION  2010   OOPHORECTOMY  1967   OVARIAN CYST REMOVAL     ROTATOR CUFF REPAIR Right 2013   SHOULDER ARTHROSCOPY  02/09/2011   Procedure: ARTHROSCOPY SHOULDER;  Surgeon: Hessie Dibble, MD;  Location: [facility];  Service: Orthopedics;  Laterality: Right;  right shoulder arthroscopy subacromial  decompression with rotator cuff repair   TONSILLECTOMY      Family History  Problem Relation Age of Onset   Stroke Mother    Cancer Father    Emphysema Father    Hypertension Other    Breast cancer Neg Hx     Social History   Socioeconomic History   Marital status: Widowed    Spouse name: Not on file   Number of children: 0   Years of education: Not on file   Highest education level: Not on file  Occupational History   Occupation: Retired  Tobacco Use   Smoking status: Never   Smokeless tobacco: Never  Vaping Use   Vaping Use: Never used  Substance and Sexual Activity   Alcohol use: No    Alcohol/week: 0.0 standard drinks of alcohol   Drug use: No   Sexual activity: Not Currently  Other Topics Concern   Not on file  Social History Narrative   Lives alone.     Social Determinants of Health   Financial Resource Strain: Not on file  Food Insecurity: Not on file  Transportation Needs: Not on file  Physical Activity: Not on file  Stress: Not on file  Social Connections: Not on file  Intimate Partner Violence: Not on file    Review of Systems  Constitutional:  Negative for chills, diaphoresis, malaise/fatigue and weight loss.  HENT: Negative.    Eyes: Negative.  Negative for blurred vision and double vision.  Cardiovascular:  Negative for chest pain.  Gastrointestinal:  Positive for constipation. Negative for abdominal pain, blood in stool and melena.  Genitourinary: Negative.   Musculoskeletal:  Negative for falls and myalgias.  Neurological:  Negative for speech change, loss of consciousness and weakness.  Psychiatric/Behavioral: Negative.        07/30/2021    1:03 PM 03/31/2021    3:52 PM 07/22/2020   10:31 AM  Depression screen PHQ 2/9  Decreased Interest 0 0 0  Down, Depressed, Hopeless 0 0 1  PHQ - 2 Score 0 0 1         Objective    BP (!) 142/72 (BP Location: Right Arm, Patient Position: Sitting, Cuff Size: Normal)   Pulse 67   Temp (!)  97.4 F (36.3 C) (Temporal)   Ht 5' 3.5" (1.613 m)   Wt 131 lb 6.4 oz (59.6 kg)   SpO2 95%   BMI 22.91 kg/m   Physical Exam Constitutional:      General: She is not in acute distress.    Appearance: Normal appearance. She is not ill-appearing, toxic-appearing or diaphoretic.  HENT:     Head: Normocephalic and atraumatic.     Right Ear: Tympanic membrane, ear canal and external ear normal.     Left Ear: External ear normal.     Mouth/Throat:  Mouth: Mucous membranes are moist.     Pharynx: Oropharynx is clear. No oropharyngeal exudate or posterior oropharyngeal erythema.  Eyes:     General: No scleral icterus.       Right eye: No discharge.        Left eye: No discharge.     Extraocular Movements: Extraocular movements intact.     Conjunctiva/sclera: Conjunctivae normal.     Pupils: Pupils are equal, round, and reactive to light.  Cardiovascular:     Rate and Rhythm: Normal rate and regular rhythm.  Pulmonary:     Effort: Pulmonary effort is normal. No respiratory distress.     Breath sounds: Normal breath sounds.  Musculoskeletal:     Cervical back: No rigidity or tenderness.  Skin:    General: Skin is warm and dry.  Neurological:     Mental Status: She is alert and oriented to person, place, and time.  Psychiatric:        Mood and Affect: Mood normal.        Behavior: Behavior normal.         Assessment & Plan:   Problem List Items Addressed This Visit       Digestive   Slow transit constipation     Endocrine   Hypothyroidism (acquired) - Primary (Chronic)    Return in about 6 months (around 01/29/2022), or if symptoms worsen or fail to improve.  Lab work is up-to-date.  Encouraged exercise and increased fiber in diet.  Recommended MiraLAX as needed constipation.  Avoid Dulcolax and other stimulant laxatives. Libby Maw, MD

## 2021-08-17 DIAGNOSIS — M7061 Trochanteric bursitis, right hip: Secondary | ICD-10-CM | POA: Diagnosis not present

## 2021-08-19 DIAGNOSIS — C44712 Basal cell carcinoma of skin of right lower limb, including hip: Secondary | ICD-10-CM | POA: Diagnosis not present

## 2021-08-19 DIAGNOSIS — C44319 Basal cell carcinoma of skin of other parts of face: Secondary | ICD-10-CM | POA: Diagnosis not present

## 2021-08-19 DIAGNOSIS — D225 Melanocytic nevi of trunk: Secondary | ICD-10-CM | POA: Diagnosis not present

## 2021-08-19 DIAGNOSIS — L821 Other seborrheic keratosis: Secondary | ICD-10-CM | POA: Diagnosis not present

## 2021-08-20 ENCOUNTER — Encounter: Payer: Self-pay | Admitting: Allergy & Immunology

## 2021-08-20 ENCOUNTER — Ambulatory Visit: Payer: Medicare Other | Admitting: Allergy & Immunology

## 2021-08-20 VITALS — BP 136/80 | HR 64 | Temp 97.6°F | Resp 16 | Ht 63.5 in | Wt 129.5 lb

## 2021-08-20 DIAGNOSIS — J310 Chronic rhinitis: Secondary | ICD-10-CM | POA: Diagnosis not present

## 2021-08-20 DIAGNOSIS — R0602 Shortness of breath: Secondary | ICD-10-CM

## 2021-08-20 NOTE — Patient Instructions (Addendum)
1. 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 Return in about 1 year (around 08/21/2022) or earlier if needed.    Please inform us of any Emergency Department visits, hospitalizations, or changes in symptoms. Call us before going to the ED for breathing or allergy symptoms since we might be able to fit you in for a sick visit. Feel free to contact us anytime with any questions, problems, or concerns.  It was a pleasure to see you again today!  Websites that have reliable patient information: 1. American Academy of Asthma, Allergy, and Immunology: www.aaaai.org 2. Food Allergy Research and Education (FARE): foodallergy.org 3. Mothers of Asthmatics: http://www.asthmacommunitynetwork.org 4. American College of Allergy, Asthma, and Immunology: www.acaai.org   COVID-19 Vaccine Information can be found at: ShippingScam.co.uk For questions related to vaccine distribution or appointments, please email [email or web address] or call [phone].   We realize that you might be concerned about having an allergic reaction to the COVID19 vaccines. To help with that concern, WE ARE OFFERING THE COVID19 VACCINES IN OUR OFFICE! Ask the front desk for dates!     "Like" [place] on Facebook and Instagram for our latest updates!      A healthy democracy works best when [employer] participate! Make sure you are registered to vote! If you have moved or changed any of your contact information, you will need to get this updated before voting!  In some cases, you MAY be able to register to vote online: [email or web address]

## 2021-08-20 NOTE — Progress Notes (Signed)
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  She is can continue with this since it worked better than the regimen I came up with.  This seems totally reasonable.  I did recommend that she not use Claritin-D on a routine basis and she reports that she does not, changing back to regular Claritin or Zyrtec when she does not need the decongestion performed.  She asked a lot of questions about shortness of breath.  I recommended that she go ahead and do a spirometry or at least get an albuterol inhaler, but she declined both.  Plan/Recommendations:   1. 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 Shortness of breath  - Patient declined spirometry today. - Patient likewise declined an albuterol inhaler. - I did ask her to come back if this recurred again.  3. Return in about 1 year (around 08/21/2022) or earlier if needed.   Subjective:   Kimberly Merritt is a 81 y.o. 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[place]) 01/16/2020   Intermediate stage nonexudative age-related macular degeneration of both eyes 01/09/2020   Posterior vitreous detachment of right eye 01/09/2020   Degenerative retinal drusen, both eyes 01/09/2020   Vitreous hemorrhage of left eye ([place]) 01/09/2020   Precordial pain 10/17/2019   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   Estrogen deficiency 02/20/2019   Macular degeneration of left eye 02/20/2019   Counseled about COVID-19 virus infection/  vaccine 02/20/2019   Osteopenia after menopause 12/07/2018   h/o foot Bone spur- of the calcium type 12/07/2018   Atherosclerotic cardiovascular disease 12/07/2018   Family history of abdominal aortic aneurysm (AAA)- father, elder 12/07/2018   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   Hyperlipidemia 03/09/2018   Immunocompromised patient ([place]) 12/01/2017   Chronic pain of right knee-since age 65 per patient 04/12/2017   Prediabetes 04/12/2017   Vitamin D insufficiency 04/12/2017   CAD (coronary artery disease) 04/05/2017   Hypertension 04/05/2017   Atherosclerosis of aorta ([place]) 04/05/2017   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   History of colonic polyps 04/05/2017   Scoliosis 04/05/2017   Ventricular premature depolarization 04/05/2017   Benign colonic polyp- found in 07/2011- told f/up Dr Watt Climes 5 yrs 04/05/2017   disabled status- since 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9 due to RA in [place] 04/05/2017   Environmental and seasonal allergies 04/05/2017   History of hypoglycemia 04/05/2017   Post-menopausal 04/05/2017   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[place]) 11/26/2016  PVC's (premature ventricular contractions) 05/18/2016   Chronic cough 10/23/2014   Nonischemic cardiomyopathy ([place]) 11/29/2013   Bradycardia 09/25/2013   Lip lesion 08/21/2013   Diverticulosis of colon without hemorrhage 05/02/2013   Paresthesia 06/20/2012   Well adult exam 08/31/2011   Cough 08/10/2011   Sinusitis, acute frontal  02/15/2011   Shoulder pain, right 07/09/2010   LIVER HEMANGIOMA 03/03/2010   MONOCLONAL GAMMOPATHY 03/03/2010   MUSCLE SPASM 03/03/2010   Herpes labialis 12/01/2009   Disorder of bone and cartilage 11/19/2008   HEPATIC CYST 08/02/2007   Allergic rhinitis 04/25/2007   HOARSENESS 04/25/2007   Hypothyroidism (acquired) 08/29/2006   VARICOSE VEINS, LOWER EXTREMITIES 08/29/2006   Gastroesophageal reflux disease 08/29/2006   Generalized osteoarthritis 08/29/2006   LOW BACK PAIN 08/29/2006   Pain in Soft Tissues of Limb 08/29/2006    History obtained from: chart review and patient.  Kimberly Merritt is a 81 y.o. female presenting for a follow up visit.  She was last seen in January 2023.  At that time, we stopped the cetirizine and continue with Astelin.  We started her on carbinoxamine 4 mg every 6 hours as needed because of her marked postnasal drip.  Since last visit, she has done well. She is now using the Nasalcrom and this seems to be working well. She is using it four times per day. She does after each of the mealtimes and at bedtime. She has been using Claritin-D which has bene helping recently with the air quality being so bad. She has not been able to get her grass mowed for a couple of weeks. She will go back to the Claritin and Zyrtec when she is not using the D containing one.   She has not had any pneumonia or sinus infections or ear infections. She is trying to remember to wear a mask when she goes outdoors. She has some problems with the pollens and air quality outdoors. She has not heard when the air quality is going to be better.  Apparently the mower said that it would be done this weekend. She is getting tired of this Conservation officer, nature. Apparently he is not very dependable. She has been told by me and others to not mow her own grass since she is allergic to it. I think others are worried about injuries as well. She lives at home and has no children.  She is wondering about getting a CXR. She  had one in May 2021 that was normal. She is unsure why she was getting them on a regular basis. From what I can tell she had chest congestion at that time. She has a history of arrhythmia and heart failure.   She is having some SOB with the air quality. She had SOB for a couple of days when we had the Code [employer] and Code [employer] Days. She has never been on a controller medication for SOB. The SOB is much better now. She declines a spirometry today.   She has another question today. She was on allergy shots when she was living in [place]. Apparently she was told that she could acquire emphysema and/or asthma. She has never been a smoker, however. She does report some intermittent SOB although her history is rather vague. She is not experiencing it at this time. She declines a spirometry today. She has had an albuterol inhaler in the past, but does not remember if it helped.  Otherwise, there have been no changes to her past medical history, surgical history, family  history, or social history.    Review of Systems  Constitutional:  Negative for chills, fever, malaise/fatigue and weight loss.  HENT: Negative.  Negative for congestion, ear discharge and ear pain.   Eyes:  Negative for pain, discharge and redness.  Respiratory:  Negative for cough, sputum production, shortness of breath and wheezing.   Cardiovascular: Negative.  Negative for chest pain and palpitations.  Gastrointestinal:  Negative for abdominal pain, constipation, diarrhea, heartburn, nausea and vomiting.  Skin: Negative.  Negative for itching and rash.  Neurological:  Negative for dizziness and headaches.  Endo/Heme/Allergies:  Negative for environmental allergies. Does not bruise/bleed easily.       Objective:   Blood pressure 136/80, pulse 64, temperature 97.6 F (36.4 C), resp. rate 16, height 5' 3.5" (1.613 m), weight 129 lb 8 oz (58.7 kg), SpO2 98 %. Body mass index is 22.58 kg/m.    Physical Exam Vitals  reviewed.  Constitutional:      Appearance: She is well-developed.  HENT:     Head: Normocephalic and atraumatic.     Right Ear: Tympanic membrane, ear canal and external ear normal. No drainage, swelling or tenderness. Tympanic membrane is not injected, scarred, erythematous, retracted or bulging.     Left Ear: Tympanic membrane, ear canal and external ear normal. No drainage, swelling or tenderness. Tympanic membrane is not injected, scarred, erythematous, retracted or bulging.     Nose: Rhinorrhea present. No nasal deformity, septal deviation or mucosal edema.     Right Turbinates: Enlarged, swollen and pale.     Left Turbinates: Enlarged, swollen and pale.     Right Sinus: No maxillary sinus tenderness or frontal sinus tenderness.     Left Sinus: No maxillary sinus tenderness or frontal sinus tenderness.     Mouth/Throat:     Mouth: Mucous membranes are not pale and not dry.     Pharynx: Uvula midline.     Comments: There is some mild cobblestoning present.  Eyes:     General:        Right eye: No discharge.        Left eye: No discharge.     Conjunctiva/sclera: Conjunctivae normal.     Right eye: Right conjunctiva is not injected. No chemosis.    Left eye: Left conjunctiva is not injected. No chemosis.    Pupils: Pupils are equal, round, and reactive to light.  Cardiovascular:     Rate and Rhythm: Normal rate and regular rhythm.     Heart sounds: Normal heart sounds.  Pulmonary:     Effort: Pulmonary effort is normal. No tachypnea, accessory muscle usage or respiratory distress.     Breath sounds: Normal breath sounds. No wheezing, rhonchi or rales.     Comments: Moving air well in all lung fields.  No increased work of breathing. Chest:     Chest wall: No tenderness.  Lymphadenopathy:     Head:     Right side of head: No submandibular, tonsillar or occipital adenopathy.     Left side of head: No submandibular, tonsillar or occipital adenopathy.     Cervical: No cervical  adenopathy.  Skin:    General: Skin is warm.     Capillary Refill: Capillary refill takes less than 2 seconds.     Coloration: Skin is not pale.     Findings: No abrasion, erythema, petechiae or rash. Rash is not papular, urticarial or vesicular.     Comments: No eczematous or urticarial lesions noted.  Neurological:  Mental Status: She is alert.  Psychiatric:        Behavior: Behavior is cooperative.      Diagnostic studies: none        Salvatore Marvel, MD  [facility]

## 2021-08-21 ENCOUNTER — Encounter: Payer: Self-pay | Admitting: Allergy & Immunology

## 2021-08-21 DIAGNOSIS — J310 Chronic rhinitis: Secondary | ICD-10-CM | POA: Insufficient documentation

## 2021-08-21 DIAGNOSIS — R0602 Shortness of breath: Secondary | ICD-10-CM | POA: Insufficient documentation

## 2021-08-31 DIAGNOSIS — L820 Inflamed seborrheic keratosis: Secondary | ICD-10-CM | POA: Diagnosis not present

## 2021-08-31 DIAGNOSIS — C44712 Basal cell carcinoma of skin of right lower limb, including hip: Secondary | ICD-10-CM | POA: Diagnosis not present

## 2021-08-31 DIAGNOSIS — L821 Other seborrheic keratosis: Secondary | ICD-10-CM | POA: Diagnosis not present

## 2021-09-04 ENCOUNTER — Ambulatory Visit (INDEPENDENT_AMBULATORY_CARE_PROVIDER_SITE_OTHER): Payer: Medicare Other | Admitting: Family Medicine

## 2021-09-04 ENCOUNTER — Encounter: Payer: Self-pay | Admitting: Family Medicine

## 2021-09-04 VITALS — BP 162/80 | HR 64 | Temp 97.3°F | Ht 63.0 in | Wt 132.4 lb

## 2021-09-04 DIAGNOSIS — D472 Monoclonal gammopathy: Secondary | ICD-10-CM

## 2021-09-04 DIAGNOSIS — J069 Acute upper respiratory infection, unspecified: Secondary | ICD-10-CM | POA: Diagnosis not present

## 2021-09-04 LAB — CBC
HCT: 34.9 % — ABNORMAL LOW (ref 35.0–45.0)
Hemoglobin: 12.2 g/dL (ref 11.7–15.5)
MCH: 31.7 pg (ref 27.0–33.0)
MCHC: 35 g/dL (ref 32.0–36.0)
MCV: 90.6 fL (ref 80.0–100.0)
MPV: 9 fL (ref 7.5–12.5)
Platelets: 323 10*3/uL (ref 140–400)
RBC: 3.85 10*6/uL (ref 3.80–5.10)
RDW: 11.4 % (ref 11.0–15.0)
WBC: 9.9 10*3/uL (ref 3.8–10.8)

## 2021-09-04 LAB — POC COVID19 BINAXNOW: SARS Coronavirus 2 Ag: NEGATIVE

## 2021-09-04 MED ORDER — AZITHROMYCIN 250 MG PO TABS: ORAL_TABLET | ORAL | 0 refills | Status: DC

## 2021-09-04 MED ORDER — BENZONATATE 200 MG PO CAPS: 200.0000 mg | ORAL_CAPSULE | Freq: Two times a day (BID) | ORAL | 0 refills | Status: DC | PRN

## 2021-09-04 MED ORDER — AMOXICILLIN 875 MG PO TABS: 875.0000 mg | ORAL_TABLET | Freq: Two times a day (BID) | ORAL | 0 refills | Status: AC

## 2021-09-04 NOTE — Progress Notes (Signed)
Established Patient Office Visit  Subjective   Patient ID: Kimberly Merritt, female    DOB: 04/16/1940  Age: 81 y.o. MRN: [id number]  Chief Complaint  Patient presents with   Cough    Cough, fatigue raspy voice, chest congestion symptoms x 3 days.     Cough Pertinent negatives include no chest pain, fever, headaches, myalgias, sore throat, shortness of breath, weight loss or wheezing.   2-day history of malaise, nasal congestion postnasal drip cough.  Denies headache, fever chills, wheezing or tightness, sore throat.  Voice feels raspy.  No history of asthma or tobacco use.  Reports stress today.  Took the wrong turn on her way to the clinic care and that was stressful for her.  She is compliant with her blood pressure medications    Review of Systems  Constitutional:  Positive for malaise/fatigue. Negative for diaphoresis, fever and weight loss.  HENT:  Positive for congestion. Negative for sore throat.   Eyes: Negative.  Negative for blurred vision and double vision.  Respiratory:  Positive for cough. Negative for sputum production, shortness of breath and wheezing.   Cardiovascular:  Negative for chest pain.  Gastrointestinal:  Negative for abdominal pain.  Genitourinary: Negative.   Musculoskeletal:  Negative for falls and myalgias.  Neurological:  Negative for speech change, loss of consciousness, weakness and headaches.  Psychiatric/Behavioral: Negative.        Objective:     BP (!) 162/80 (BP Location: Right Arm, Patient Position: Sitting, Cuff Size: Normal)   Pulse 64   Temp (!) 97.3 F (36.3 C) (Temporal)   Ht '5\' 3"'$  (1.6 m)   Wt 132 lb 6.4 oz (60.1 kg)   SpO2 99%   BMI 23.45 kg/m  BP Readings from Last 3 Encounters:  09/04/21 (!) 162/80  08/20/21 136/80  07/30/21 (!) 142/72   Wt Readings from Last 3 Encounters:  09/04/21 132 lb 6.4 oz (60.1 kg)  08/20/21 129 lb 8 oz (58.7 kg)  07/30/21 131 lb 6.4 oz (59.6 kg)      Physical Exam Constitutional:       General: She is not in acute distress.    Appearance: Normal appearance. She is not ill-appearing, toxic-appearing or diaphoretic.  HENT:     Head: Normocephalic and atraumatic.     Right Ear: Tympanic membrane, ear canal and external ear normal.     Left Ear: Tympanic membrane, ear canal and external ear normal.     Mouth/Throat:     Mouth: Mucous membranes are moist.     Pharynx: Oropharynx is clear. No oropharyngeal exudate or posterior oropharyngeal erythema.  Eyes:     General: No scleral icterus.       Right eye: No discharge.        Left eye: No discharge.     Extraocular Movements: Extraocular movements intact.     Conjunctiva/sclera: Conjunctivae normal.     Pupils: Pupils are equal, round, and reactive to light.  Cardiovascular:     Rate and Rhythm: Normal rate and regular rhythm.  Pulmonary:     Effort: Pulmonary effort is normal. No respiratory distress.     Breath sounds: Normal breath sounds. No wheezing or rales.  Abdominal:     General: Bowel sounds are normal.     Tenderness: There is no abdominal tenderness. There is no guarding.  Musculoskeletal:     Cervical back: No rigidity or tenderness.  Skin:    General: Skin is warm and dry.  Neurological:  Mental Status: She is alert and oriented to person, place, and time.  Psychiatric:        Mood and Affect: Mood normal.        Behavior: Behavior normal.      Results for orders placed or performed in visit on 09/04/21  POC COVID-19  Result Value Ref Range   SARS Coronavirus 2 Ag Negative Negative      The ASCVD Risk score (Arnett DK, et al., 2019) failed to calculate for the following reasons:   The 2019 ASCVD risk score is only valid for ages 45 to 59    Assessment & Plan:   Problem List Items Addressed This Visit       Respiratory   Viral upper respiratory tract infection - Primary   Relevant Medications   benzonatate (TESSALON) 200 MG capsule   amoxicillin (AMOXIL) 875 MG tablet   Other  Relevant Orders   POC COVID-19 (Completed)   CBC     Other   MONOCLONAL GAMMOPATHY (Chronic)    Return if symptoms worsen or fail to improve.    Libby Maw, MD

## 2021-09-08 ENCOUNTER — Ambulatory Visit (INDEPENDENT_AMBULATORY_CARE_PROVIDER_SITE_OTHER): Payer: Medicare Other

## 2021-09-08 ENCOUNTER — Other Ambulatory Visit: Payer: Self-pay | Admitting: Hematology and Oncology

## 2021-09-08 VITALS — Ht 63.0 in | Wt 132.0 lb

## 2021-09-08 DIAGNOSIS — Z0000 Encounter for general adult medical examination without abnormal findings: Secondary | ICD-10-CM | POA: Diagnosis not present

## 2021-09-08 DIAGNOSIS — Z1231 Encounter for screening mammogram for malignant neoplasm of breast: Secondary | ICD-10-CM

## 2021-09-08 NOTE — Progress Notes (Signed)
Subjective:   Kimberly Merritt is a 81 y.o. female who presents for Medicare Annual (Subsequent) preventive examination.  Review of Systems    No ROS.  Medicare Wellness Virtual Visit.  Visual/audio telehealth visit, UTA vital signs.   See social history for additional risk factors.   Cardiac Risk Factors include: advanced age (>89mn, >>30women);hypertension     Objective:    Today's Vitals   09/08/21 1017  Weight: 132 lb (59.9 kg)  Height: '5\' 3"'$  (1.6 m)   Body mass index is 23.38 kg/m.     09/08/2021   10:30 AM 12/04/2020    1:01 PM 07/22/2020   10:30 AM 12/16/2016    1:59 PM 11/13/2015   11:12 AM 11/12/2014   11:33 AM 10/04/2013    7:38 AM  Advanced Directives  Does Patient Have a Medical Advance Directive? Yes Yes No No No No No  Type of [place] will Living will       Does patient want to make changes to medical advance directive? No - Patient declined        Copy of HPeculiarin Chart? Yes - validated most recent copy scanned in chart (See row information)        Would patient like information on creating a medical advance directive?   No - Patient declined    No - patient declined information    Current Medications (verified) Outpatient Encounter Medications as of 09/08/2021  Medication Sig   amoxicillin (AMOXIL) 875 MG tablet Take 1 tablet (875 mg total) by mouth 2 (two) times daily for 10 days.   carvedilol (COREG) 12.5 MG tablet TAKE 1 TABLET BY MOUTH 2 TIMES DAILY.   cetirizine (ZYRTEC) 10 MG tablet Take 10 mg by mouth daily.   Cholecalciferol (VITAMIN D3) 50 MCG (2000 UT) capsule Take 1 capsule by mouth daily.   cimetidine (TAGAMET) 200 MG tablet Take 200 mg by mouth daily.   cromolyn (NASALCROM) 5.2 MG/ACT nasal spray Place 1 spray into both nostrils 4 (four) times daily.   Glucosamine-Chondroitin 1500-1200 MG/30ML LIQD Take 1 tablet by mouth daily.   losartan (COZAAR) 25 MG tablet TAKE 1 TABLET  BY MOUTH DAILY.   sucralfate (CARAFATE) 1 g tablet Take 1 g by mouth daily as needed.    SYNTHROID 25 MCG tablet TAKE 1 TABLET BY MOUTH DAILY BEFORE BREAKFAST   tiZANidine (ZANAFLEX) 2 MG tablet Take 2 mg by mouth at bedtime as needed.   benzonatate (TESSALON) 200 MG capsule Take 1 capsule (200 mg total) by mouth 2 (two) times daily as needed for cough. (Patient not taking: Reported on 09/08/2021)   pantoprazole (PROTONIX) 40 MG tablet Take 40 mg by mouth daily. (Patient not taking: Reported on 09/04/2021)   No facility-administered encounter medications on file as of 09/08/2021.    Allergies (verified) Acetaminophen, Alendronate sodium, Codeine, Diflunisal, Doxycycline, Flexeril [cyclobenzaprine], Iodinated contrast media, Lisinopril, Nabumetone, Other, Oxybutynin, Raloxifene, Sulfa antibiotics, Symbicort [budesonide-formoterol fumarate], Tramadol hcl, Tramadol hcl, Diclofenac, and Latex   History: Past Medical History:  Diagnosis Date   Allergy    CAD (coronary artery disease)    Nonobstructive. LAD 30% stenosis, small PDA of the circumflex 70% stenosis, right coronary artery 40% stenosis. 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   LOW BACK PAIN 08/29/2006  MONOCLONAL GAMMOPATHY 03/03/2010   MUSCLE SPASM 03/03/2010   Nonischemic cardiomyopathy ([place])    EF 30%   OSTEOARTHRITIS 08/29/2006   OSTEOPENIA 11/19/2008   PARESTHESIA 03/05/2009   PVC's (premature ventricular contractions)    Scoliosis    VARICOSE VEINS, LOWER EXTREMITIES 08/29/2006   Past Surgical History:  Procedure Laterality Date   ABDOMINAL HYSTERECTOMY  1981   APPENDECTOMY     BACK SURGERY  2010,2006   lumb fusion   BUNIONECTOMY     both   BUNIONECTOMY Right 1985   CARPAL TUNNEL RELEASE     both   CARPAL TUNNEL RELEASE Right 1992   CARPAL TUNNEL RELEASE Left 1992    CATARACT EXTRACTION     both   CATARACT EXTRACTION Right 2008   CATARACT EXTRACTION Left 2008   HAMMER TOE SURGERY Left 1987   HAMMER TOE SURGERY Right 1986   LEFT AND RIGHT HEART CATHETERIZATION WITH CORONARY ANGIOGRAM N/A 10/04/2013   Procedure: LEFT AND RIGHT HEART CATHETERIZATION WITH CORONARY ANGIOGRAM;  Surgeon: Leonie Man, MD;  Location: [facility] CATH LAB;  Service: Cardiovascular;  Laterality: N/A;   LUMBAR FUSION  2006   LUMBAR FUSION  2010   OOPHORECTOMY  1967   OVARIAN CYST REMOVAL     ROTATOR CUFF REPAIR Right 2013   SHOULDER ARTHROSCOPY  02/09/2011   Procedure: ARTHROSCOPY SHOULDER;  Surgeon: Hessie Dibble, MD;  Location: [facility];  Service: Orthopedics;  Laterality: Right;  right shoulder arthroscopy subacromial decompression with rotator cuff repair   TONSILLECTOMY     Family History  Problem Relation Age of Onset   Stroke Mother    Cancer Father    Emphysema Father    Hypertension Other    Breast cancer Neg Hx    Social History   Socioeconomic History   Marital status: Widowed    Spouse name: Not on file   Number of children: 0   Years of education: Not on file   Highest education level: Not on file  Occupational History   Occupation: Retired  Tobacco Use   Smoking status: Never   Smokeless tobacco: Never  Vaping Use   Vaping Use: Never used  Substance and Sexual Activity   Alcohol use: No    Alcohol/week: 0.0 standard drinks of alcohol   Drug use: No   Sexual activity: Not Currently  Other Topics Concern   Not on file  Social History Narrative   Lives alone.     Social Determinants of Health   Financial Resource Strain: Low Risk  (09/08/2021)   Overall Financial Resource Strain (CARDIA)    Difficulty of Paying Living Expenses: Not hard at all  Food Insecurity: No Food Insecurity (09/08/2021)   Hunger Vital Sign    Worried About Running Out of Food in the Last Year: Never true    Ran Out of Food in the Last Year: Never true   Transportation Needs: No Transportation Needs (09/08/2021)   PRAPARE - Hydrologist (Medical): No    Lack of Transportation (Non-Medical): No  Physical Activity: Insufficiently Active (09/08/2021)   Exercise Vital Sign    Days of Exercise per Week: 5 days    Minutes of Exercise per Session: 20 min  Stress: No Stress Concern Present (09/08/2021)   [place]    Feeling of Stress : Not at all  Social Connections: Unknown (09/08/2021)   Social Connection and Isolation Panel [NHANES]  Frequency of Communication with Friends and Family: More than three times a week    Frequency of Social Gatherings with Friends and Family: Not on file    Attends Religious Services: Not on file    Active Member of Clubs or Organizations: Not on file    Attends Archivist Meetings: Not on file    Marital Status: Not on file    Tobacco Counseling Counseling given: Not Answered   Clinical Intake:  Pre-visit preparation completed: Yes        Diabetes: No  How often do you need to have someone help you when you read instructions, pamphlets, or other written materials from your doctor or pharmacy?: 1 - Never   Interpreter Needed?: No      Activities of Daily Living    09/08/2021   10:39 AM  In your present state of health, do you have any difficulty performing the following activities:  Hearing? 0  Vision? 0  Difficulty concentrating or making decisions? 0  Walking or climbing stairs? 0  Dressing or bathing? 0  Doing errands, shopping? 0  Preparing Food and eating ? N  Using the Toilet? N  In the past six months, have you accidently leaked urine? N  Do you have problems with loss of bowel control? N  Managing your Medications? N  Managing your Finances? N  Housekeeping or managing your Housekeeping? N    Patient Care Team: Libby Maw, MD as PCP - General (Family  Medicine) Minus Breeding, MD as PCP - Cardiology (Cardiology) Melrose Nakayama, MD as Attending Physician (Orthopedic Surgery) Magrinat, Virgie Dad, MD (Inactive) (Hematology and Oncology) Clarene Essex, MD (Gastroenterology) Rutherford Guys, MD as Attending Physician (Ophthalmology) Jovita Gamma, MD as Consulting Physician (Neurosurgery) Minus Breeding, MD as Consulting Physician (Cardiology) Levy Sjogren, MD as Referring Physician (Dermatology) Irine Seal, MD as Attending Physician (Urology) Mosetta Anis, MD as Referring Physician (Allergy) Elam Dutch, MD (Inactive) as Consulting Physician (Vascular Surgery) Hurman Horn, MD as Consulting Physician (Ophthalmology)  Indicate any recent Medical Services you may have received from other than [facility] providers in the past year (date may be approximate).     Assessment:   This is a routine wellness examination for [place].  Virtual Visit via Telephone Note  I connected with  DELSY ETZKORN on 09/08/21 at 10:15 AM EDT by telephone and verified that I am speaking with the correct person using two identifiers.  Location: Patient: home Provider: office  Persons participating in the virtual visit: patient/Nurse Health Advisor   I discussed the limitations of perfoming an evaluation and management service by telehealth. We continued and completed visit with audio only. Some vital signs may be absent or patient reported.   Hearing/Vision screen Hearing Screening - Comments:: Patient is able to hear conversational tones without difficulty.  No issues reported. Vision Screening - Comments:: Followed by Dr. Diana Eves Wears corrective lenses They have seen their ophthalmologist in the last 12 months.    Dietary issues and exercise activities discussed: Current Exercise Habits: Home exercise routine, Time (Minutes): 20, Frequency (Times/Week): 5, Weekly Exercise (Minutes/Week): 100, Intensity: MildHealthy diet Good water  intake     Goals Addressed             This Visit's Progress    Increase physical activity       Post physical therapy exercises Shoulder exercises       Depression Screen    09/08/2021   10:22 AM 09/04/2021  2:56 PM 07/30/2021    1:53 PM 07/30/2021    1:03 PM 03/31/2021    3:52 PM 07/22/2020   10:31 AM 02/08/2020    2:53 PM  PHQ 2/9 Scores  PHQ - 2 Score 0 0 0 0 0 1 0  PHQ- 9 Score 0  0        Fall Risk    09/08/2021   10:31 AM 09/04/2021    2:56 PM 07/30/2021    1:02 PM 03/31/2021    3:18 PM 11/21/2020    1:39 PM  Fall Risk   Falls in the past year? 0 0 0 0 0  Number falls in past yr: 0 0 0 0 0  Injury with Fall?   0 0 0  Risk for fall due to :    No Fall Risks No Fall Risks  Follow up Falls evaluation completed        FALL RISK PREVENTION PERTAINING TO THE HOME: Home free of loose throw rugs in walkways, pet beds, electrical cords, etc? Yes  Adequate lighting in your home to reduce risk of falls? Yes   ASSISTIVE DEVICES UTILIZED TO PREVENT FALLS: Life alert? No  Use of a cane, walker or w/c? No   TIMED UP AND GO: Was the test performed? No .   Cognitive Function: Patient is alert and oriented x3.      07/22/2020   10:31 AM  MMSE - Mini Mental State Exam  Orientation to time 5  Orientation to Place 5  Registration 3  Attention/ Calculation 0  Recall 3  Language- name 2 objects 2  Language- repeat 1  Language- follow 3 step command 3  Language- read & follow direction 1  Write a sentence 1  Copy design 0  Total score 24        09/08/2021   10:42 AM 05/30/2018    1:32 PM  6CIT Screen  What Year? 0 points 0 points  What month? 0 points 0 points  What time? 0 points 0 points  Count back from 20 0 points 0 points  Months in reverse 0 points 0 points  Repeat phrase 0 points 4 points  Total Score 0 points 4 points    Immunizations Immunization History  Administered Date(s) Administered   H1N1 02/13/2008   Influenza Split 11/23/2011,  10/30/2013, 10/09/2014   Influenza Whole 02/02/2005, 11/10/2007, 11/27/2008, 12/01/2009   Influenza, High Dose Seasonal PF 10/09/2014, 11/16/2017, 11/09/2018, 11/07/2020   Influenza-Unspecified 11/01/2012, 10/30/2013, 11/09/2018, 10/18/2019   PFIZER(Purple Top)SARS-COV-2 Vaccination 03/09/2019, 04/03/2019, 10/30/2019   Pneumococcal Conjugate-13 04/18/2013   Pneumococcal Polysaccharide-23 12/02/2005   Td 12/08/2004   Tdap 04/12/2017   Screening Tests Health Maintenance  Topic Date Due   INFLUENZA VACCINE  09/01/2021   TETANUS/TDAP  04/13/2027   Pneumonia Vaccine 68+ Years old  Completed   DEXA SCAN  Completed   HPV VACCINES  Aged Out   COVID-19 Vaccine  Discontinued   Zoster Vaccines- Shingrix  Discontinued   Health Maintenance Health Maintenance Due  Topic Date Due   INFLUENZA VACCINE  09/01/2021   Lung Cancer Screening: (Low Dose CT Chest recommended if Age 74-80 years, 30 pack-year currently smoking OR have quit w/in 15years.) does not qualify.   Vision Screening: Recommended annual ophthalmology exams for early detection of glaucoma and other disorders of the eye.  Dental Screening: Recommended annual dental exams for proper oral hygiene  Community Resource Referral / Chronic Care Management: CRR required this visit?  No  CCM required this visit?  No      Plan:   Keep all routine maintenance appointments.   I have personally reviewed and noted the following in the patient's chart:   Medical and social history Use of alcohol, tobacco or illicit drugs  Current medications and supplements including opioid prescriptions.  Functional ability and status Nutritional status Physical activity Advanced directives List of other physicians Hospitalizations, surgeries, and ER visits in previous 12 months Vitals Screenings to include cognitive, depression, and falls Referrals and appointments  In addition, I have reviewed and discussed with patient certain preventive  protocols, quality metrics, and best practice recommendations. A written personalized care plan for preventive services as well as general preventive health recommendations were provided to patient.     Varney Biles, LPN   02/01/1550

## 2021-09-08 NOTE — Patient Instructions (Addendum)
  Kimberly Merritt , Thank you for taking time to come for your Medicare Wellness Visit. I appreciate your ongoing commitment to your health goals. Please review the following plan we discussed and let me know if I can assist you in the future.   These are the goals we discussed:  Goals      Increase physical activity     Post physical therapy exercises Shoulder exercises        This is a list of the screening recommended for you and due dates:  Health Maintenance  Topic Date Due   Flu Shot  09/01/2021   Tetanus Vaccine  04/13/2027   Pneumonia Vaccine  Completed   DEXA scan (bone density measurement)  Completed   HPV Vaccine  Aged Out   COVID-19 Vaccine  Discontinued   Zoster (Shingles) Vaccine  Discontinued

## 2021-09-09 DIAGNOSIS — C44319 Basal cell carcinoma of skin of other parts of face: Secondary | ICD-10-CM | POA: Diagnosis not present

## 2021-09-10 ENCOUNTER — Telehealth: Payer: Self-pay | Admitting: Family Medicine

## 2021-09-10 NOTE — Telephone Encounter (Signed)
Pt is wanting a call back concerning her most recent lab results, please advise pt '@[phone]

## 2021-09-11 NOTE — Telephone Encounter (Signed)
Returned patients call informed that covid test was negative patient verbally understood after other labs are viewed by Provider we will give her a call with results/recommendations. Please advise

## 2021-09-11 NOTE — Telephone Encounter (Signed)
Patient aware of lab results, no concerns.

## 2021-09-11 NOTE — Telephone Encounter (Signed)
Caller Name: Jerlisa Diliberto Call back phone #: [phone]  Reason for Call: Please call pt back, she was hoping to have heard back Monday with results

## 2021-10-01 DIAGNOSIS — K219 Gastro-esophageal reflux disease without esophagitis: Secondary | ICD-10-CM | POA: Diagnosis not present

## 2021-10-01 DIAGNOSIS — K5730 Diverticulosis of large intestine without perforation or abscess without bleeding: Secondary | ICD-10-CM | POA: Diagnosis not present

## 2021-10-07 DIAGNOSIS — C44319 Basal cell carcinoma of skin of other parts of face: Secondary | ICD-10-CM | POA: Diagnosis not present

## 2021-10-13 ENCOUNTER — Telehealth: Payer: Self-pay | Admitting: Family Medicine

## 2021-10-13 DIAGNOSIS — E039 Hypothyroidism, unspecified: Secondary | ICD-10-CM

## 2021-10-13 MED ORDER — SYNTHROID 25 MCG PO TABS: 25.0000 ug | ORAL_TABLET | Freq: Every day | ORAL | 1 refills | Status: DC

## 2021-10-13 NOTE — Telephone Encounter (Signed)
Refills sent in patient aware.

## 2021-10-13 NOTE — Telephone Encounter (Signed)
Caller Name: Pt Call back phone #: [phone]  Reason for Call: Pt stated that Dr Ethelene Hal has not prescribed her Synthroid but needs it refilled. [facility] on [address]

## 2021-10-15 NOTE — Progress Notes (Unsigned)
Cardiology Office Note   Date:  10/16/2021   ID:  Kimberly Merritt, DOB 09/21/1940, MRN [id number]  PCP:  Libby Maw, MD  Cardiologist:   Minus Breeding, MD   Chief Complaint  Patient presents with   Palpitations     History of Present Illness: Kimberly Merritt is a 81 y.o. female who presents  for follow up of cardiomyopathy. she has been noted to have an ejection fraction of 30%. This was brought to attention after she was found to have frequent premature ventricular contractions. Cardiac catheterization demonstrated nonobstructive and small vessel disease.   EF was 55% on echo with moderate TR in April 2018.  At a previous visit she had chest pain.  However, she had a negative Lexiscan Myoview.   Her EF was well preserved on echo.  This was 2021.    Since I last saw her she has had no new cardiovascular problems.  She had a little bit of dizziness back in August and maybe some of fleeting chest discomfort across her left upper chest.  None of this was different than symptoms she had in 2021.  She actually had a garden and is doing her canning and freezing.  She is not having any problems doing that activity.  She occasionally gets some blood pressures going to the 140s and 150s that concerned her but it seems that overall her blood pressure is well controlled.  She has had a little lower extremity swelling that has been mild.  She is not having any new shortness of breath, PND or orthopnea.  She is not having any new presyncope or syncope.  She did have an event where she was taking her blood pressure and she noticed that the monitor said 0 but she did not feel anything.  She had a history of PVCs.  This happened only 1 time and she did not seek care for this.   Past Medical History:  Diagnosis Date   Allergy    CAD (coronary artery disease)    Nonobstructive. LAD 30% stenosis, small PDA of the circumflex 70% stenosis, right coronary artery 40% stenosis.  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/03/2010   MUSCLE SPASM 03/03/2010   Nonischemic cardiomyopathy ([place])    EF 30%   OSTEOARTHRITIS 08/29/2006   OSTEOPENIA 11/19/2008   PARESTHESIA 03/05/2009   PVC's (premature ventricular contractions)    Scoliosis    VARICOSE VEINS, LOWER EXTREMITIES 08/29/2006    Past Surgical History:  Procedure Laterality Date   ABDOMINAL HYSTERECTOMY  1981   APPENDECTOMY     BACK SURGERY  2010,2006   lumb fusion   BUNIONECTOMY     both   BUNIONECTOMY Right 1985   CARPAL TUNNEL RELEASE     both   CARPAL TUNNEL RELEASE Right 1992   CARPAL TUNNEL RELEASE Left 1992   CATARACT EXTRACTION     both   CATARACT EXTRACTION Right 2008   CATARACT EXTRACTION Left 2008   HAMMER TOE SURGERY Left 1987   HAMMER TOE SURGERY Right 1986   LEFT AND RIGHT HEART CATHETERIZATION WITH CORONARY ANGIOGRAM N/A 10/04/2013   Procedure: LEFT AND RIGHT HEART CATHETERIZATION WITH CORONARY ANGIOGRAM;  Surgeon: Leonie Man, MD;  Location: [facility] CATH LAB;  Service: Cardiovascular;  Laterality:  N/A;   LUMBAR FUSION  2006   LUMBAR FUSION  2010   OOPHORECTOMY  1967   OVARIAN CYST REMOVAL     ROTATOR CUFF REPAIR Right 2013   SHOULDER ARTHROSCOPY  02/09/2011   Procedure: ARTHROSCOPY SHOULDER;  Surgeon: Hessie Dibble, MD;  Location: [facility];  Service: Orthopedics;  Laterality: Right;  right shoulder arthroscopy subacromial decompression with rotator cuff repair   TONSILLECTOMY       Current Outpatient Medications  Medication Sig Dispense Refill   carvedilol (COREG) 12.5 MG tablet TAKE 1 TABLET BY MOUTH 2 TIMES DAILY. 180 tablet 1   cetirizine (ZYRTEC) 10 MG tablet Take 10 mg by mouth daily.     Cholecalciferol (VITAMIN D3) 50 MCG (2000 UT) capsule Take 1 capsule by  mouth daily.     cimetidine (TAGAMET) 200 MG tablet Take 200 mg by mouth daily.     cromolyn (NASALCROM) 5.2 MG/ACT nasal spray Place 1 spray into both nostrils 4 (four) times daily.     Glucosamine-Chondroitin 1500-1200 MG/30ML LIQD Take 1 tablet by mouth daily.     losartan (COZAAR) 25 MG tablet TAKE 1 TABLET BY MOUTH DAILY. 90 tablet 2   pantoprazole (PROTONIX) 40 MG tablet Take 40 mg by mouth daily.     sucralfate (CARAFATE) 1 g tablet Take 1 g by mouth daily as needed.      SYNTHROID 25 MCG tablet Take 1 tablet (25 mcg total) by mouth daily before breakfast. 90 tablet 1   tiZANidine (ZANAFLEX) 2 MG tablet Take 2 mg by mouth at bedtime as needed.     No current facility-administered medications for this visit.    Allergies:   Acetaminophen, Alendronate sodium, Codeine, Diflunisal, Doxycycline, Flexeril [cyclobenzaprine], Iodinated contrast media, Lisinopril, Nabumetone, Other, Oxybutynin, Raloxifene, Sulfa antibiotics, Symbicort [budesonide-formoterol fumarate], Tramadol hcl, Tramadol hcl, Diclofenac, and Latex    ROS:  Please see the history of present illness.   Otherwise, review of systems are positive for none.   All other systems are reviewed and negative.    PHYSICAL EXAM: VS:  BP 134/62   Pulse 63   Ht '5\' 3"'$  (1.6 m)   Wt 132 lb (59.9 kg)   SpO2 99%   BMI 23.38 kg/m  , BMI Body mass index is 23.38 kg/m. GENERAL:  Well appearing NECK:  No jugular venous distention, waveform within normal limits, carotid upstroke brisk and symmetric, no bruits, no thyromegaly LUNGS:  Clear to auscultation bilaterally CHEST:  Unremarkable HEART:  PMI not displaced or sustained,S1 and S2 within normal limits, no S3, no S4, no clicks, no rubs, no murmurs ABD:  Flat, positive bowel sounds normal in frequency in pitch, no bruits, no rebound, no guarding, no midline pulsatile mass, no hepatomegaly, no splenomegaly EXT:  2 plus pulses throughout, no edema, no cyanosis no clubbing    EKG:  EKG  is  ordered today. Sinus rhythm, rate 63, axis within normal limits, intervals within normal limits, no acute ST-T wave changes.  Recent Labs: 03/31/2021: TSH 1.630 05/25/2021: ALT 12; BUN 21; Creatinine 0.83; Potassium 4.4; Sodium 135 09/04/2021: Hemoglobin 12.2; Platelets 323    Lipid Panel    Component Value Date/Time   CHOL 185 01/23/2020 0822   TRIG 37 01/23/2020 0822   HDL 68 01/23/2020 0822   CHOLHDL 2.7 01/23/2020 0822   CHOLHDL 2 06/08/2011 0830   VLDL 6.4 06/08/2011 0830   LDLCALC 109 (H) 01/23/2020 0822   LDLDIRECT 106 (H) 10/23/2018 1110   LDLDIRECT  131.0 02/05/2008 0931      Wt Readings from Last 3 Encounters:  10/16/21 132 lb (59.9 kg)  09/08/21 132 lb (59.9 kg)  09/04/21 132 lb 6.4 oz (60.1 kg)      Other studies Reviewed: Additional studies/ records that were reviewed today include:   Labs Review of the above records demonstrates:  Please see elsewhere in the note.     ASSESSMENT AND PLAN:   CHEST PAIN:  She had a negative Lexiscan Myoview in 2021.  She has had no new symptoms.  No change in therapy.  HTN:       The blood pressure is fluctuating but overall well controlled.  No change in therapy.   PVCs:    She is not having any new symptoms related to this.  No change in therapy.   TR:     This was mild on echo in 2021.  I will follow this clinically.    Current medicines are reviewed at length with the patient today.  The patient does not have concerns regarding medicines.  The following changes have been made:   None  Labs/ tests ordered today include: None  Orders Placed This Encounter  Procedures   EKG 12-Lead      Disposition:   FU with me in 12 months.    Signed, Minus Breeding, MD  10/16/2021 11:53 AM    [facility]

## 2021-10-16 ENCOUNTER — Encounter: Payer: Self-pay | Admitting: Cardiology

## 2021-10-16 ENCOUNTER — Ambulatory Visit: Payer: Medicare Other | Attending: Cardiology | Admitting: Cardiology

## 2021-10-16 VITALS — BP 134/62 | HR 63 | Ht 63.0 in | Wt 132.0 lb

## 2021-10-16 DIAGNOSIS — Q228 Other congenital malformations of tricuspid valve: Secondary | ICD-10-CM

## 2021-10-16 DIAGNOSIS — I493 Ventricular premature depolarization: Secondary | ICD-10-CM

## 2021-10-16 DIAGNOSIS — I10 Essential (primary) hypertension: Secondary | ICD-10-CM

## 2021-10-16 DIAGNOSIS — R072 Precordial pain: Secondary | ICD-10-CM | POA: Diagnosis not present

## 2021-10-16 MED ORDER — CARVEDILOL 12.5 MG PO TABS: 12.5000 mg | ORAL_TABLET | Freq: Two times a day (BID) | ORAL | 3 refills | Status: DC

## 2021-10-16 NOTE — Addendum Note (Signed)
Addended by: Orvan July on: 10/16/2021 12:06 PM   Modules accepted: Orders

## 2021-10-16 NOTE — Patient Instructions (Signed)
Medication Instructions:  Your physician recommends that you continue on your current medications as directed. Please refer to the Current Medication list given to you today.  *If you need a refill on your cardiac medications before your next appointment, please call your pharmacy*   Lab Work: None  Testing/Procedures: None   Follow-Up: At [name], you and your health needs are our priority.  As part of our continuing mission to provide you with exceptional heart care, we have created designated Provider Care Teams.  These Care Teams include your primary Cardiologist (physician) and Advanced Practice Providers (APPs -  Physician Assistants and Nurse Practitioners) who all work together to provide you with the care you need, when you need it.  We recommend signing up for the patient portal called "MyChart".  Sign up information is provided on this After Visit Summary.  MyChart is used to connect with patients for Virtual Visits (Telemedicine).  Patients are able to view lab/test results, encounter notes, upcoming appointments, etc.  Non-urgent messages can be sent to your provider as well.   To learn more about what you can do with MyChart, go to [email or web address].    Your next appointment:   1 year(s)  The format for your next appointment:   In Person  Provider:   Minus Breeding, MD     Other Instructions   Important Information About Sugar

## 2021-10-19 DIAGNOSIS — M7061 Trochanteric bursitis, right hip: Secondary | ICD-10-CM | POA: Diagnosis not present

## 2021-10-21 ENCOUNTER — Ambulatory Visit
Admission: RE | Admit: 2021-10-21 | Discharge: 2021-10-21 | Disposition: A | Discharge status: Home / Self Care | Payer: Medicare Other | Source: Ambulatory Visit | Attending: Hematology and Oncology | Admitting: Hematology and Oncology

## 2021-10-21 DIAGNOSIS — Z1231 Encounter for screening mammogram for malignant neoplasm of breast: Secondary | ICD-10-CM | POA: Diagnosis not present

## 2021-11-23 ENCOUNTER — Other Ambulatory Visit: Payer: Self-pay | Admitting: Cardiology

## 2021-11-23 DIAGNOSIS — C44629 Squamous cell carcinoma of skin of left upper limb, including shoulder: Secondary | ICD-10-CM | POA: Diagnosis not present

## 2021-11-23 DIAGNOSIS — Z85828 Personal history of other malignant neoplasm of skin: Secondary | ICD-10-CM | POA: Diagnosis not present

## 2021-11-23 DIAGNOSIS — L570 Actinic keratosis: Secondary | ICD-10-CM | POA: Diagnosis not present

## 2021-11-23 DIAGNOSIS — L905 Scar conditions and fibrosis of skin: Secondary | ICD-10-CM | POA: Diagnosis not present

## 2021-11-23 DIAGNOSIS — Z08 Encounter for follow-up examination after completed treatment for malignant neoplasm: Secondary | ICD-10-CM | POA: Diagnosis not present

## 2021-11-23 DIAGNOSIS — D485 Neoplasm of uncertain behavior of skin: Secondary | ICD-10-CM | POA: Diagnosis not present

## 2021-11-23 DIAGNOSIS — C44319 Basal cell carcinoma of skin of other parts of face: Secondary | ICD-10-CM | POA: Diagnosis not present

## 2021-12-08 DIAGNOSIS — L538 Other specified erythematous conditions: Secondary | ICD-10-CM | POA: Diagnosis not present

## 2021-12-08 DIAGNOSIS — L298 Other pruritus: Secondary | ICD-10-CM | POA: Diagnosis not present

## 2021-12-08 DIAGNOSIS — L570 Actinic keratosis: Secondary | ICD-10-CM | POA: Diagnosis not present

## 2021-12-08 DIAGNOSIS — L820 Inflamed seborrheic keratosis: Secondary | ICD-10-CM | POA: Diagnosis not present

## 2021-12-08 DIAGNOSIS — C44629 Squamous cell carcinoma of skin of left upper limb, including shoulder: Secondary | ICD-10-CM | POA: Diagnosis not present

## 2021-12-08 DIAGNOSIS — Z789 Other specified health status: Secondary | ICD-10-CM | POA: Diagnosis not present

## 2021-12-08 DIAGNOSIS — R208 Other disturbances of skin sensation: Secondary | ICD-10-CM | POA: Diagnosis not present

## 2021-12-09 ENCOUNTER — Telehealth: Payer: Self-pay | Admitting: *Deleted

## 2021-12-09 ENCOUNTER — Other Ambulatory Visit: Payer: Self-pay | Admitting: Hematology and Oncology

## 2021-12-09 DIAGNOSIS — D472 Monoclonal gammopathy: Secondary | ICD-10-CM

## 2021-12-09 DIAGNOSIS — D801 Nonfamilial hypogammaglobulinemia: Secondary | ICD-10-CM

## 2021-12-09 NOTE — Progress Notes (Signed)
Orders placed per pt's requisition  Kimberly Merritt

## 2021-12-09 NOTE — Telephone Encounter (Signed)
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 Surgery was performed by Dr Charlton Amor at the [facility].  Infusion appt canceled per pt request.  No further needs at this time- this message will be forwarded to MD for review of communication.

## 2021-12-16 ENCOUNTER — Encounter: Payer: Self-pay | Admitting: Hematology and Oncology

## 2021-12-16 ENCOUNTER — Inpatient Hospital Stay: Payer: Medicare Other | Admitting: Hematology and Oncology

## 2021-12-16 ENCOUNTER — Inpatient Hospital Stay: Payer: Medicare Other | Attending: Hematology and Oncology

## 2021-12-16 ENCOUNTER — Ambulatory Visit: Payer: Medicare Other

## 2021-12-16 ENCOUNTER — Other Ambulatory Visit: Payer: Self-pay

## 2021-12-16 VITALS — BP 133/60 | HR 62 | Temp 97.7°F | Resp 16 | Ht 63.0 in | Wt 132.8 lb

## 2021-12-16 DIAGNOSIS — I509 Heart failure, unspecified: Secondary | ICD-10-CM | POA: Insufficient documentation

## 2021-12-16 DIAGNOSIS — D801 Nonfamilial hypogammaglobulinemia: Secondary | ICD-10-CM | POA: Diagnosis not present

## 2021-12-16 DIAGNOSIS — I428 Other cardiomyopathies: Secondary | ICD-10-CM | POA: Diagnosis not present

## 2021-12-16 DIAGNOSIS — M1990 Unspecified osteoarthritis, unspecified site: Secondary | ICD-10-CM | POA: Diagnosis not present

## 2021-12-16 DIAGNOSIS — R5383 Other fatigue: Secondary | ICD-10-CM | POA: Insufficient documentation

## 2021-12-16 DIAGNOSIS — D472 Monoclonal gammopathy: Secondary | ICD-10-CM | POA: Diagnosis not present

## 2021-12-16 DIAGNOSIS — K5900 Constipation, unspecified: Secondary | ICD-10-CM | POA: Diagnosis not present

## 2021-12-16 DIAGNOSIS — R110 Nausea: Secondary | ICD-10-CM | POA: Insufficient documentation

## 2021-12-16 LAB — CBC WITH DIFFERENTIAL/PLATELET
Abs Immature Granulocytes: 0.03 K/uL (ref 0.00–0.07)
Basophils Absolute: 0.1 K/uL (ref 0.0–0.1)
Basophils Relative: 1 %
Eosinophils Absolute: 0.1 K/uL (ref 0.0–0.5)
Eosinophils Relative: 2 %
HCT: 36.5 % (ref 36.0–46.0)
Hemoglobin: 12 g/dL (ref 12.0–15.0)
Immature Granulocytes: 0 %
Lymphocytes Relative: 41 %
Lymphs Abs: 3.2 K/uL (ref 0.7–4.0)
MCH: 31.1 pg (ref 26.0–34.0)
MCHC: 32.9 g/dL (ref 30.0–36.0)
MCV: 94.6 fL (ref 80.0–100.0)
Monocytes Absolute: 1 K/uL (ref 0.1–1.0)
Monocytes Relative: 13 %
Neutro Abs: 3.4 K/uL (ref 1.7–7.7)
Neutrophils Relative %: 43 %
Platelets: 330 K/uL (ref 150–400)
RBC: 3.86 MIL/uL — ABNORMAL LOW (ref 3.87–5.11)
RDW: 11.9 % (ref 11.5–15.5)
WBC: 7.8 K/uL (ref 4.0–10.5)
nRBC: 0 % (ref 0.0–0.2)

## 2021-12-16 LAB — COMPREHENSIVE METABOLIC PANEL WITH GFR
ALT: 15 U/L (ref 0–44)
AST: 16 U/L (ref 15–41)
Albumin: 3.9 g/dL (ref 3.5–5.0)
Alkaline Phosphatase: 71 U/L (ref 38–126)
Anion gap: 6 (ref 5–15)
BUN: 18 mg/dL (ref 8–23)
CO2: 29 mmol/L (ref 22–32)
Calcium: 8.9 mg/dL (ref 8.9–10.3)
Chloride: 101 mmol/L (ref 98–111)
Creatinine, Ser: 0.77 mg/dL (ref 0.44–1.00)
GFR, Estimated: >60 mL/min
Glucose, Bld: 87 mg/dL (ref 70–99)
Potassium: 4.1 mmol/L (ref 3.5–5.1)
Sodium: 136 mmol/L (ref 135–145)
Total Bilirubin: 0.4 mg/dL (ref 0.3–1.2)
Total Protein: 7.2 g/dL (ref 6.5–8.1)

## 2021-12-16 NOTE — Progress Notes (Signed)
[facility]  Telephone:([phone]) [phone] Fax:(336) [phone]     ID: Kimberly Merritt DOB: [birth year]  MR#: [id number]  JYN#:[id number]  Patient Care Team: Libby Maw, MD as PCP - General (Family Medicine) Minus Breeding, MD as PCP - Cardiology (Cardiology) Melrose Nakayama, MD as Attending Physician (Orthopedic Surgery) Magrinat, Virgie Dad, MD (Inactive) (Hematology and Oncology) Clarene Essex, MD (Gastroenterology) Rutherford Guys, MD as Attending Physician (Ophthalmology) Jovita Gamma, MD as Consulting Physician (Neurosurgery) Minus Breeding, MD as Consulting Physician (Cardiology) Levy Sjogren, MD as Referring Physician (Dermatology) Irine Seal, MD as Attending Physician (Urology) Mosetta Anis, MD as Referring Physician (Allergy) Elam Dutch, MD (Inactive) as Consulting Physician (Vascular Surgery) Hurman Horn, MD as Consulting Physician (Ophthalmology) OTHER MD: Minus Breeding MD, Marshell Garfinkel MD  CHIEF COMPLAINT: IgM M-GUS/ low-grade lymphoproliferative process  CURRENT TREATMENT: IVIG as needed; Bearden returns today for 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  Since last visit, she tells me she had a upper respiratory tract infection in August and an abdominal flulike infection in October.  She is hoping to get her IVIG this fall.  In the past when she received it every fall, she felt better with less infections and she could get over infections easily she states.  However she got a denial from [place] healthcare last year for IVIG and she wants to only proceed with it if she gets approval.  She otherwise denies any B symptoms.   Rest of the pertinent 10 point ROS reviewed and negative  A detailed review of systems was otherwise stable   COVID 19 VACCINATION STATUS: fully vaccinated [employer]), with booster 10/2019   HISTORY PRESENT ILLNESS::  from the earlier summary  note:  "Kimberly Merritt" Blunt is a 81 y/o [place] woman I evaluated originally in October of 2004 for a monoclonal gammopathy. At that time she had a white cell count of 7.2, hemoglobin 12.8, MCV 94.5, and platelets 298,000. Protein workup showed an IgM kappa paraprotein of 0.6 g, with a total IgG slightly depressed at 556, but a normal IgA at 82. Because an IgM paraprotein can be associated with a low grade lymphoproliferative process (Waldenstrm's macroglobulinemia) Kimberly Merritt underwent bone marrow biopsy 11/27/2002, which showed ([id number]) a normal cellular marrow with trilineage hematopoiesis and 5% plasma cells. The peripheral blood film showed no significant abnormalities and on the biopsy para-spicular lymphoid aggregates were not noted. 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  The patient was followed with observation alone through May of 2009, with no evidence of progression in her IgM kappa clone. As of 06/03/2007 her total IgM spike was 0.83, her IgG was 490 and her IgA was 36. The patient was released from followup here at that time.  More recently, as the 10 year anniversary of her original bone marrow biopsy past, Dr. Dorann Lodge of referred the patient back for reassessment and possible bone marrow rebiopsy. Kimberly Merritt was scheduled here for lab work 09/25/2013. However when she had her blood drawn she complained of some shortness of breath and her pulse was checked. It was 33. The patient was referred to the emergency room where she was evaluated by Dr. Percival Spanish who felt the patient's true heart rate was higher (she has ectopic beats which generated a week pulse and may have been missed) since in the emergency room EKG her rate was 98 with normal sinus rhythm. Troponins were negative. BNP was slightly elevated.  Dr. Percival Spanish  set the patient up for an echocardiogram 09/27/2013 and this showed an ejection fraction of 30-35%. There was diffuse hypokinesis. She  underwent right and left heart catheterization 10/04/2013 which showed no significant obstructions. The study did confirm an ejection fraction of roughly 35% with global hypokinesis. She is followed by Dr. Percival Spanish for her new diagnosis of non-ischemic cardiomyopathy.  Her subsequent history is as detailed below   PAST MEDICAL HISTORY: Past Medical History:  Diagnosis Date   Allergy    CAD (coronary artery disease)    Nonobstructive. LAD 30% stenosis, small PDA of the circumflex 70% stenosis, right coronary artery 40% stenosis. 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/03/2010   MUSCLE SPASM 03/03/2010   Nonischemic cardiomyopathy ([place])    EF 30%   OSTEOARTHRITIS 08/29/2006   OSTEOPENIA 11/19/2008   PARESTHESIA 03/05/2009   PVC's (premature ventricular contractions)    Scoliosis    VARICOSE VEINS, LOWER EXTREMITIES 08/29/2006    PAST SURGICAL HISTORY: Past Surgical History:  Procedure Laterality Date   ABDOMINAL HYSTERECTOMY  1981   APPENDECTOMY     BACK SURGERY  2008/03/28   lumb fusion   BUNIONECTOMY     both   BUNIONECTOMY Right 1985   CARPAL TUNNEL RELEASE     both   CARPAL TUNNEL RELEASE Right 1992   CARPAL TUNNEL RELEASE Left 1992   CATARACT EXTRACTION     both   CATARACT EXTRACTION Right 28-Mar-2006   CATARACT EXTRACTION Left 03-28-2006   HAMMER TOE SURGERY Left 1987   HAMMER TOE SURGERY Right 1986   LEFT AND RIGHT HEART CATHETERIZATION WITH CORONARY ANGIOGRAM N/A 10/04/2013   Procedure: LEFT AND RIGHT HEART CATHETERIZATION WITH CORONARY ANGIOGRAM;  Surgeon: Leonie Man, MD;  Location: [facility] CATH LAB;  Service: Cardiovascular;  Laterality: N/A;   LUMBAR FUSION  2004-03-28   LUMBAR FUSION  March 28, 2008   OOPHORECTOMY  1967   OVARIAN CYST REMOVAL     ROTATOR CUFF REPAIR Right 2011-03-29   SHOULDER  ARTHROSCOPY  02/09/2011   Procedure: ARTHROSCOPY SHOULDER;  Surgeon: Hessie Dibble, MD;  Location: [facility];  Service: Orthopedics;  Laterality: Right;  right shoulder arthroscopy subacromial decompression with rotator cuff repair   TONSILLECTOMY      FAMILY HISTORY Family History  Problem Relation Age of Onset   Stroke Mother    Cancer Father    Emphysema Father    Hypertension Other    Breast cancer Neg Hx    the patient's father died at the age of 27 from lung cancer in the setting of tobacco abuse. [name] mother died at the age of 62 following a stroke. The patient had one brother, who is severely retarded and died from pneumonia at the age of 45. There were no sisters. There is no history of blood problems or cancer in the family to the patient's knowledge   GYNECOLOGIC HISTORY:  No LMP recorded. Patient is postmenopausal. Menarche age 3. The patient is GX P0. She stopped having periods in 1978/03/28 and took hormone replacement for almost 20 years.   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 Her husband died in 29-Mar-1991 from a myocardial infarction.  She tells me she has no family.  She lives  by herself, [place], with one cat for company.    ADVANCED DIRECTIVES: Her healthcare power of attorney is Charm Barges who can be reached at [phone] (cell), or [phone] (home).  Also if Baldo Ash had an acute problem she would want [place] to contact her neighbor and third cousin Andrey Cota at [place]: Social History   Tobacco Use   Smoking status: Never   Smokeless tobacco: Never  Vaping Use   Vaping Use: Never used  Substance Use Topics   Alcohol use: No    Alcohol/week: 0.0 standard drinks of alcohol   Drug use: No     Colonoscopy: 2014  PAP: 2013  Bone density: 11/2018, -2.4  Lipid panel:  Allergies  Allergen Reactions   Acetaminophen Rash    REACTION: high dose -  tremor Per pt - rash on chest and heart palpitations   Alendronate Sodium     Stomach upset.    Codeine     May interfere with heart condition     Diflunisal     Stomach pain.     Doxycycline     n/v   Flexeril [Cyclobenzaprine]     Abdominal pain.     Iodinated Contrast Media Other (See Comments)    Syncope.    Lisinopril Other (See Comments)   Nabumetone     Abdominal pain; dark, tarry stool.     Other Other (See Comments)   Oxybutynin     Pt does not remember.     Raloxifene     Unknown.     Sulfa Antibiotics Nausea Only    Syncope.     Symbicort [Budesonide-Formoterol Fumarate]     "Jittery-ness."    Tramadol Hcl     Numbness.     Tramadol Hcl Itching    Numbness.     Diclofenac Palpitations    Dark, tarry stool.     Latex Rash    Current Outpatient Medications  Medication Sig Dispense Refill   carvedilol (COREG) 12.5 MG tablet Take 1 tablet (12.5 mg total) by mouth 2 (two) times daily. 180 tablet 3   cetirizine (ZYRTEC) 10 MG tablet Take 10 mg by mouth daily.     Cholecalciferol (VITAMIN D3) 50 MCG (2000 UT) capsule Take 1 capsule by mouth daily.     cimetidine (TAGAMET) 200 MG tablet Take 200 mg by mouth daily.     cromolyn (NASALCROM) 5.2 MG/ACT nasal spray Place 1 spray into both nostrils 4 (four) times daily.     Glucosamine-Chondroitin 1500-1200 MG/30ML LIQD Take 1 tablet by mouth daily.     losartan (COZAAR) 25 MG tablet TAKE 1 TABLET BY MOUTH DAILY. 90 tablet 2   pantoprazole (PROTONIX) 40 MG tablet Take 40 mg by mouth daily.     sucralfate (CARAFATE) 1 g tablet Take 1 g by mouth daily as needed.      SYNTHROID 25 MCG tablet Take 1 tablet (25 mcg total) by mouth daily before breakfast. 90 tablet 1   tiZANidine (ZANAFLEX) 2 MG tablet Take 2 mg by mouth at bedtime as needed.     No current facility-administered medications for this visit.    OBJECTIVE: white woman who appears stated age  81:   12/16/21 1105  BP: 133/60  Pulse: 62  Resp: 16   Temp: 97.7 F (36.5 C)  SpO2: 98%     Body mass index is 23.52 kg/m.    ECOG FS:2 - Symptomatic, <50% confined to bed  Physical Exam Constitutional:      Appearance: Normal appearance.  Cardiovascular:     Rate and Rhythm: Normal rate and regular rhythm.     Pulses: Normal pulses.     Heart sounds: Normal heart sounds.  Musculoskeletal:     Cervical back: Normal range of motion and neck supple. No rigidity.     Comments: Severe arthritis of hands  Lymphadenopathy:     Cervical: No cervical adenopathy.  Neurological:     Mental Status: She is alert.     LAB RESULTS:  CMP     Component Value Date/Time   NA 135 05/25/2021 1053   NA 136 05/21/2020 1357   NA 136 11/04/2016 1115   K 4.4 05/25/2021 1053   K 4.8 11/04/2016 1115   CL 100 05/25/2021 1053   CO2 30 05/25/2021 1053   CO2 27 11/04/2016 1115   GLUCOSE 91 05/25/2021 1053   GLUCOSE 95 11/04/2016 1115   BUN 21 05/25/2021 1053   BUN 20 05/21/2020 1357   BUN 16.7 11/04/2016 1115   CREATININE 0.83 05/25/2021 1053   CREATININE 0.8 11/04/2016 1115   CALCIUM 9.0 05/25/2021 1053   CALCIUM 9.0 11/04/2016 1115   PROT 7.0 05/25/2021 1053   PROT 7.0 05/21/2020 1357   PROT 6.6 11/04/2016 1115   ALBUMIN 3.8 05/25/2021 1053   ALBUMIN 4.0 05/21/2020 1357   ALBUMIN 3.5 11/04/2016 1115   AST 14 (L) 05/25/2021 1053   AST 23 11/04/2016 1115   ALT 12 05/25/2021 1053   ALT 18 11/04/2016 1115   ALKPHOS 69 05/25/2021 1053   ALKPHOS 82 11/04/2016 1115   BILITOT 0.4 05/25/2021 1053   BILITOT 0.40 11/04/2016 1115   GFRNONAA >60 05/25/2021 1053   GFRNONAA 69 10/02/2013 1234   GFRAA 74 01/23/2020 0822   GFRAA 80 10/02/2013 1234    I No results found for: "SPEP", "UPEP"  Lab Results  Component Value Date   WBC 7.8 12/16/2021   NEUTROABS 3.4 12/16/2021   HGB 12.0 12/16/2021   HCT 36.5 12/16/2021   MCV 94.6 12/16/2021   PLT 330 12/16/2021      Chemistry      Component Value Date/Time   NA 135 05/25/2021 1053   NA  136 05/21/2020 1357   NA 136 11/04/2016 1115   K 4.4 05/25/2021 1053   K 4.8 11/04/2016 1115   CL 100 05/25/2021 1053   CO2 30 05/25/2021 1053   CO2 27 11/04/2016 1115   BUN 21 05/25/2021 1053   BUN 20 05/21/2020 1357   BUN 16.7 11/04/2016 1115   CREATININE 0.83 05/25/2021 1053   CREATININE 0.8 11/04/2016 1115   GLU 89 04/08/2016 0000      Component Value Date/Time   CALCIUM 9.0 05/25/2021 1053   CALCIUM 9.0 11/04/2016 1115   ALKPHOS 69 05/25/2021 1053   ALKPHOS 82 11/04/2016 1115   AST 14 (L) 05/25/2021 1053   AST 23 11/04/2016 1115   ALT 12 05/25/2021 1053   ALT 18 11/04/2016 1115   BILITOT 0.4 05/25/2021 1053   BILITOT 0.40 11/04/2016 1115       No results found for: "LABCA2"  No components found for: "LABCA125"  No results for input(s): "INR" in the last 168 hours.  Urinalysis    Component Value Date/Time   COLORURINE LT. YELLOW 06/08/2011 0830   APPEARANCEUR Clear 05/14/2020 1519   LABSPEC 1.010 06/08/2011 0830   PHURINE 6.5 06/08/2011 0830   GLUCOSEU Negative 05/14/2020 1519   GLUCOSEU NEGATIVE  06/08/2011 0830   HGBUR NEGATIVE 06/08/2011 0830   BILIRUBINUR Negative 05/14/2020 1519   KETONESUR NEGATIVE 06/08/2011 0830   PROTEINUR Negative 05/14/2020 1519   PROTEINUR NEGATIVE 04/19/2008 1216   UROBILINOGEN 0.2 10/23/2018 1047   UROBILINOGEN 0.2 06/08/2011 0830   NITRITE Negative 05/14/2020 1519   NITRITE NEGATIVE 06/08/2011 0830   LEUKOCYTESUR Negative 05/14/2020 1519   STUDIES: No results found.  ASSESSMENT:   81 y.o. 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 In case of an infection, IVIG supplementation should be considered  (a) received IVIG October 2016  (b) repeated IVIG October 2018  (c)  repeated November 2019 and November 2020  (d) [place] May 2022   PLAN:  Patient is clinically stable compared to her visit 6 months ago.  She continues to have fatigue, baseline constipation and arthritis related issues.  No major change in these issues.  She has also noticed some nausea in late afternoon for which she drinks like a ginger ale. Physical examination, severe arthritis, otherwise no new findings.  No palpable lymphadenopathy or hepatosplenomegaly. We once again discussed about the hypogammaglobulinemia and IVIG replacement that she gets every fall.  She can certainly consider this since she feels better however in general IVIG has a short half-life and has to be repeated multiple times to actually gain the effect.  She wants to wait for [employer] to approve the IVIG and then proceed with that.  She tells me that her last year IVIG was denied.  Otherwise she had her MGUS labs today.  We will call her if there is any significant change.  She will return to clinic in 6 months. She was also encouraged to call us with any new questions or concerns.  Total time spent: 20 minutes  *Total Encounter Time as defined by the Centers for Medicare and Medicaid Services includes, in addition to the face-to-face time of a patient visit (documented in the note above) non-face-to-face time: obtaining and reviewing outside history, ordering and reviewing medications, tests or procedures, care coordination (communications with other health care professionals or caregivers) and documentation in the medical record.

## 2021-12-17 LAB — KAPPA/LAMBDA LIGHT CHAINS
Kappa free light chain: 20.7 mg/L — ABNORMAL HIGH (ref 3.3–19.4)
Kappa, lambda light chain ratio: 5.31 — ABNORMAL HIGH (ref 0.26–1.65)
Lambda free light chains: 3.9 mg/L — ABNORMAL LOW (ref 5.7–26.3)

## 2021-12-21 LAB — MULTIPLE MYELOMA PANEL, SERUM
Albumin SerPl Elph-Mcnc: 3.5 g/dL (ref 2.9–4.4)
Albumin/Glob SerPl: 1.2 (ref 0.7–1.7)
Alpha 1: 0.2 g/dL (ref 0.0–0.4)
Alpha2 Glob SerPl Elph-Mcnc: 0.6 g/dL (ref 0.4–1.0)
B-Globulin SerPl Elph-Mcnc: 1.9 g/dL — ABNORMAL HIGH (ref 0.7–1.3)
Gamma Glob SerPl Elph-Mcnc: 0.2 g/dL — ABNORMAL LOW (ref 0.4–1.8)
Globulin, Total: 3 g/dL (ref 2.2–3.9)
IgA: 10 mg/dL — ABNORMAL LOW (ref 64–422)
IgG (Immunoglobin G), Serum: 289 mg/dL — ABNORMAL LOW (ref 586–1602)
IgM (Immunoglobulin M), Srm: 1959 mg/dL — ABNORMAL HIGH (ref 26–217)
M Protein SerPl Elph-Mcnc: 1.3 g/dL — ABNORMAL HIGH
Total Protein ELP: 6.5 g/dL (ref 6.0–8.5)

## 2021-12-28 ENCOUNTER — Other Ambulatory Visit: Payer: Self-pay | Admitting: Hematology and Oncology

## 2021-12-28 ENCOUNTER — Telehealth: Payer: Self-pay | Admitting: Hematology and Oncology

## 2021-12-28 DIAGNOSIS — D801 Nonfamilial hypogammaglobulinemia: Secondary | ICD-10-CM

## 2021-12-28 DIAGNOSIS — D472 Monoclonal gammopathy: Secondary | ICD-10-CM

## 2021-12-28 DIAGNOSIS — I8393 Asymptomatic varicose veins of bilateral lower extremities: Secondary | ICD-10-CM

## 2021-12-28 NOTE — Progress Notes (Signed)
IVIG reordered, [name] will send a message to scheduling if pt agreeable.  Kimberly Merritt

## 2021-12-28 NOTE — Telephone Encounter (Addendum)
Pt called today and left a message stating she thought about what was discussed at her visit. And decided if her labs were good she would like to cancel her yearly treatment. Message given to Dr. Chryl Heck to review. Return call to Pt to inform her that Dr Chryl Heck reviewed her labs and stated they were low and have been since the past two years. Pt stated she would like to cancel the treatment and if she needs the treatment she will call to schedule. Dr Chryl Heck agrees with this and states orders are in when she decides she wants the treatment Pt also inquired about RSV injection. Dr. Chryl Heck states this is ok. Pt verbalized understanding.

## 2022-01-04 ENCOUNTER — Telehealth: Payer: Self-pay | Admitting: *Deleted

## 2022-01-04 ENCOUNTER — Other Ambulatory Visit: Payer: Self-pay | Admitting: Allergy & Immunology

## 2022-01-04 MED ORDER — CROMOLYN SODIUM 5.2 MG/ACT NA AERS: 1.0000 | INHALATION_SPRAY | Freq: Four times a day (QID) | NASAL | 5 refills | Status: AC

## 2022-01-04 NOTE — Telephone Encounter (Signed)
Patient called - she has lots of congestion and thinks she may have sinus infection. She is seeing her PCP in AM. She cancelled IVIG infusion originally scheduled for 12/6 due to being sick. She wants to know when Dr. Chryl Heck recommends to reschedule the IVIG? Question routed to Dr. Chryl Heck    Patient mailed copy of most recent labs at her request

## 2022-01-05 ENCOUNTER — Encounter: Payer: Self-pay | Admitting: Family Medicine

## 2022-01-05 ENCOUNTER — Encounter: Payer: Self-pay | Admitting: Oncology

## 2022-01-05 ENCOUNTER — Ambulatory Visit (INDEPENDENT_AMBULATORY_CARE_PROVIDER_SITE_OTHER): Payer: Medicare Other | Admitting: Family Medicine

## 2022-01-05 VITALS — BP 148/78 | HR 60 | Temp 97.4°F | Ht 63.0 in | Wt 133.0 lb

## 2022-01-05 DIAGNOSIS — J0100 Acute maxillary sinusitis, unspecified: Secondary | ICD-10-CM

## 2022-01-05 DIAGNOSIS — R0982 Postnasal drip: Secondary | ICD-10-CM | POA: Diagnosis not present

## 2022-01-05 DIAGNOSIS — D472 Monoclonal gammopathy: Secondary | ICD-10-CM

## 2022-01-05 DIAGNOSIS — R0981 Nasal congestion: Secondary | ICD-10-CM | POA: Diagnosis not present

## 2022-01-05 MED ORDER — AMOXICILLIN 875 MG PO TABS: 875.0000 mg | ORAL_TABLET | Freq: Two times a day (BID) | ORAL | 0 refills | Status: AC

## 2022-01-05 MED ORDER — GUAIFENESIN ER 600 MG PO TB12: 600.0000 mg | ORAL_TABLET | Freq: Two times a day (BID) | ORAL | 0 refills | Status: DC | PRN

## 2022-01-05 NOTE — Telephone Encounter (Signed)
Per Dr. Chryl Heck - can reschedule IVIG anytime in future once she feels better.  Contacted patient with this information and she asked to be rescheduled week of  December 18. Patient said PCP gave her antibiotics to treat sinus infection and she was tested for  Covid, Flu and RSV. No test results back at this time.   Schedule message sent.

## 2022-01-05 NOTE — Progress Notes (Signed)
Established Patient Office Visit   Subjective:  Patient ID: Kimberly Merritt, female    DOB: 12/31/1940  Age: 81 y.o. MRN: [id number]  Chief Complaint  Patient presents with   Sinusitis    Sinus pressure x 3 days little cough with some drainage.     Sinusitis Associated symptoms include congestion, coughing and headaches. Pertinent negatives include no shortness of breath.   Encounter Diagnoses  Name Primary?   MONOCLONAL GAMMOPATHY Yes   Acute maxillary sinusitis, recurrence not specified    3 to 4-day history of URI symptoms with nasal congestion, facial pressure, postnasal drip cough.  Denies fevers chills, teeth pain, difficulty breathing or wheezing.  Currently in treatment for monoclonal ganglionopathy.  She is immunocompromised.   Review of Systems  Constitutional: Negative.   HENT:  Positive for congestion and sinus pain.   Eyes:  Negative for blurred vision, discharge and redness.  Respiratory:  Positive for cough. Negative for sputum production, shortness of breath and wheezing.   Cardiovascular: Negative.   Gastrointestinal:  Negative for abdominal pain.  Genitourinary: Negative.   Musculoskeletal: Negative.  Negative for joint pain and myalgias.  Skin:  Negative for rash.  Neurological:  Positive for headaches. Negative for tingling, loss of consciousness and weakness.  Endo/Heme/Allergies:  Negative for polydipsia.     Current Outpatient Medications:    amoxicillin (AMOXIL) 875 MG tablet, Take 1 tablet (875 mg total) by mouth 2 (two) times daily for 10 days., Disp: 20 tablet, Rfl: 0   carvedilol (COREG) 12.5 MG tablet, Take 1 tablet (12.5 mg total) by mouth 2 (two) times daily., Disp: 180 tablet, Rfl: 3   cetirizine (ZYRTEC) 10 MG tablet, Take 10 mg by mouth daily., Disp: , Rfl:    Cholecalciferol (VITAMIN D3) 50 MCG (2000 UT) capsule, Take 1 capsule by mouth daily., Disp: , Rfl:    cimetidine (TAGAMET) 200 MG tablet, Take 200 mg by mouth daily., Disp: , Rfl:     cromolyn (NASALCROM) 5.2 MG/ACT nasal spray, Place 1 spray into both nostrils 4 (four) times daily., Disp: 26 mL, Rfl: 5   Glucosamine-Chondroitin 1500-1200 MG/30ML LIQD, Take 1 tablet by mouth daily., Disp: , Rfl:    guaiFENesin (MUCINEX) 600 MG 12 hr tablet, Take 1 tablet (600 mg total) by mouth 2 (two) times daily as needed., Disp: 20 tablet, Rfl: 0   losartan (COZAAR) 25 MG tablet, TAKE 1 TABLET BY MOUTH DAILY., Disp: 90 tablet, Rfl: 2   pantoprazole (PROTONIX) 40 MG tablet, Take 40 mg by mouth daily., Disp: , Rfl:    sucralfate (CARAFATE) 1 g tablet, Take 1 g by mouth daily as needed. , Disp: , Rfl:    SYNTHROID 25 MCG tablet, Take 1 tablet (25 mcg total) by mouth daily before breakfast., Disp: 90 tablet, Rfl: 1   tiZANidine (ZANAFLEX) 2 MG tablet, Take 2 mg by mouth at bedtime as needed., Disp: , Rfl:    Objective:     BP (!) 148/78 (BP Location: Right Arm, Patient Position: Sitting, Cuff Size: Normal)   Pulse 60   Temp (!) 97.4 F (36.3 C) (Temporal)   Ht '5\' 3"'$  (1.6 m)   Wt 133 lb (60.3 kg)   SpO2 96%   BMI 23.56 kg/m    Physical Exam Constitutional:      General: She is not in acute distress.    Appearance: Normal appearance. She is not ill-appearing, toxic-appearing or diaphoretic.  HENT:     Head: Normocephalic and atraumatic.  Right Ear: Tympanic membrane, ear canal and external ear normal.     Left Ear: Tympanic membrane, ear canal and external ear normal.  Eyes:     General: No scleral icterus.       Right eye: No discharge.        Left eye: No discharge.     Extraocular Movements: Extraocular movements intact.     Conjunctiva/sclera: Conjunctivae normal.     Pupils: Pupils are equal, round, and reactive to light.  Cardiovascular:     Rate and Rhythm: Normal rate and regular rhythm.  Pulmonary:     Effort: Pulmonary effort is normal. No respiratory distress.     Breath sounds: Normal breath sounds. No wheezing or rales.  Musculoskeletal:     Cervical  back: No rigidity or tenderness.  Lymphadenopathy:     Cervical: No cervical adenopathy.  Skin:    General: Skin is warm and dry.  Neurological:     Mental Status: She is alert and oriented to person, place, and time.  Psychiatric:        Mood and Affect: Mood normal.        Behavior: Behavior normal.      No results found for any visits on 01/05/22.    The ASCVD Risk score (Arnett DK, et al., 2019) failed to calculate for the following reasons:   The 2019 ASCVD risk score is only valid for ages 68 to 35    Assessment & Plan:   MONOCLONAL GAMMOPATHY  Acute maxillary sinusitis, recurrence not specified -     COVID-19, Flu A+B and RSV -     Amoxicillin; Take 1 tablet (875 mg total) by mouth 2 (two) times daily for 10 days.  Dispense: 20 tablet; Refill: 0 -     guaiFENesin ER; Take 1 tablet (600 mg total) by mouth 2 (two) times daily as needed.  Dispense: 20 tablet; Refill: 0    Return if symptoms worsen or fail to improve.    Libby Maw, MD

## 2022-01-06 ENCOUNTER — Ambulatory Visit: Payer: Medicare Other

## 2022-01-08 LAB — COVID-19, FLU A+B AND RSV
Influenza A, NAA: NOT DETECTED
Influenza B, NAA: NOT DETECTED
RSV, NAA: NOT DETECTED
SARS-CoV-2, NAA: NOT DETECTED

## 2022-01-12 ENCOUNTER — Telehealth: Payer: Self-pay | Admitting: Family Medicine

## 2022-01-12 NOTE — Telephone Encounter (Signed)
Patient aware she can start on Zyrtec

## 2022-01-12 NOTE — Telephone Encounter (Signed)
Spoke with patient went over negative testing results. Per patient she have gotten a little better, patient would like to know if it would be okay for her to start back on Zyrtec medication states she was told to hold on this. Please advise.

## 2022-01-12 NOTE — Telephone Encounter (Signed)
Pt stated she took some test a couple weeks ago and did not receive her results. Please call her

## 2022-01-15 ENCOUNTER — Inpatient Hospital Stay (HOSPITAL_BASED_OUTPATIENT_CLINIC_OR_DEPARTMENT_OTHER): Payer: Medicare Other | Admitting: Physician Assistant

## 2022-01-15 ENCOUNTER — Inpatient Hospital Stay: Payer: Medicare Other | Attending: Hematology and Oncology

## 2022-01-15 VITALS — BP 145/68 | HR 65 | Temp 98.4°F | Resp 17 | Wt 133.5 lb

## 2022-01-15 DIAGNOSIS — T7840XD Allergy, unspecified, subsequent encounter: Secondary | ICD-10-CM

## 2022-01-15 DIAGNOSIS — D472 Monoclonal gammopathy: Secondary | ICD-10-CM | POA: Diagnosis not present

## 2022-01-15 DIAGNOSIS — M2550 Pain in unspecified joint: Secondary | ICD-10-CM | POA: Diagnosis not present

## 2022-01-15 DIAGNOSIS — R519 Headache, unspecified: Secondary | ICD-10-CM | POA: Insufficient documentation

## 2022-01-15 DIAGNOSIS — D801 Nonfamilial hypogammaglobulinemia: Secondary | ICD-10-CM | POA: Insufficient documentation

## 2022-01-15 DIAGNOSIS — K130 Diseases of lips: Secondary | ICD-10-CM

## 2022-01-15 DIAGNOSIS — R053 Chronic cough: Secondary | ICD-10-CM

## 2022-01-15 MED ORDER — DEXTROSE 5 % IV SOLN: Freq: Once | INTRAVENOUS | Status: AC

## 2022-01-15 MED ORDER — DIPHENHYDRAMINE HCL 12.5 MG/5ML PO ELIX
12.5000 mg | ORAL_SOLUTION | Freq: Once | ORAL | Status: AC
  Administered 2022-01-15: 12.5 mg via ORAL
  Filled 2022-01-15: qty 5

## 2022-01-15 MED ORDER — SODIUM CHLORIDE 0.9 % IV SOLN: Freq: Once | INTRAVENOUS | Status: AC

## 2022-01-15 MED ORDER — FAMOTIDINE IN NACL 20-0.9 MG/50ML-% IV SOLN
20.0000 mg | Freq: Once | INTRAVENOUS | Status: AC
  Administered 2022-01-15: 20 mg via INTRAVENOUS

## 2022-01-15 MED ORDER — IMMUNE GLOBULIN (HUMAN) 10 GM/100ML IV SOLN
1.0000 g/kg | Freq: Once | INTRAVENOUS | Status: AC
  Administered 2022-01-15: 60 g via INTRAVENOUS
  Filled 2022-01-15: qty 600

## 2022-01-15 NOTE — Progress Notes (Signed)
Per Dr. Chryl Heck Privigen infusion to be capped at a max rate of 145 mL/hr d/t reaction on 12/04/2020.

## 2022-01-15 NOTE — Progress Notes (Signed)
DATE:  01/15/22                                        X CHEMO/IMMUNOTHERAPY REACTION             MD: Chryl Heck   AGENT/BLOOD PRODUCT RECEIVING TODAY:              Privigen   AGENT/BLOOD PRODUCT RECEIVING IMMEDIATELY PRIOR TO REACTION:          Privigen   VS: BP:     142/62   P:       61       SPO2:       100 % RA                BP:     148/69   P:       63       SPO2:       100% RA     REACTION(S):           headache, body aches   PREMEDS:     benadryl 12.5 mg PO   INTERVENTION: IVF, pepcid 20 mg IV   Review of Systems  Review of Systems  Constitutional:  Negative for chills, diaphoresis and fever.  HENT:  Negative for congestion, drooling, facial swelling, nosebleeds, rhinorrhea, sinus pressure, sinus pain, sore throat, trouble swallowing and voice change.   Eyes:  Negative for photophobia, pain and visual disturbance.  Respiratory:  Negative for apnea, cough, choking, chest tightness, shortness of breath, wheezing and stridor.   Cardiovascular:  Negative for leg swelling.  Gastrointestinal:  Negative for diarrhea, nausea and vomiting.  Endocrine: Negative for cold intolerance and heat intolerance.  Genitourinary:  Negative for flank pain and pelvic pain.  Musculoskeletal:  Positive for arthralgias. Negative for joint swelling, myalgias and neck pain.  Skin:  Negative for color change and rash.  Neurological:  Positive for headaches.  Hematological:  Negative for adenopathy.  Psychiatric/Behavioral:  Negative for confusion and hallucinations.      Physical Exam  Physical Exam Vitals and nursing note reviewed.  Constitutional:      Appearance: She is well-developed. She is not ill-appearing or toxic-appearing.  HENT:     Head: Normocephalic.     Nose: Nose normal.  Eyes:     Conjunctiva/sclera: Conjunctivae normal.  Neck:     Vascular: No JVD.  Cardiovascular:     Rate and Rhythm: Normal rate and regular rhythm.     Pulses: Normal pulses.     Heart sounds:  Normal heart sounds.  Pulmonary:     Effort: Pulmonary effort is normal.     Breath sounds: Normal breath sounds.  Abdominal:     General: There is no distension.  Musculoskeletal:     Cervical back: Normal range of motion. No rigidity or tenderness.  Skin:    General: Skin is warm and dry.  Neurological:     Mental Status: She is oriented to person, place, and time.     Comments: Speech is clear and goal oriented, follows commands CN III-XII intact, no facial droop Normal strength in upper and lower extremities bilaterally including dorsiflexion and plantar flexion, strong and equal grip strength Sensation normal to light and sharp touch Moves extremities without ataxia, coordination intact Normal finger to nose and rapid alternating movements Normal gait and balance  OUTCOME:                Patient had reaction of bodyaches and headache during treatment.  She had similar reaction on 12/04/20 and was able to tolerate remainder of treatment with slower rate.  Treatment was paused and patient was given IV fluids and Pepcid.  Symptoms resolved and treatment was restarted at slower rate.  Patient tolerated remainder of treatment.  Discussed with Dr. Chryl Heck who agrees with plan.   I have spent a total of 10 minutes minutes of face-to-face and non-face-to-face time, preparing to see the patient, obtaining and/or reviewing separately obtained history, performing a medically appropriate examination, counseling and educating the patient, ordering medications, referring and communicating with other health care professionals, documenting clinical information in the electronic health record, and care coordination.

## 2022-01-15 NOTE — Progress Notes (Signed)
Hypersensitivity Reaction note  Date of event: 01/15/22 Time of event: 1218 Generic name of drug involved: [place] Name of provider notified of the hypersensitivity reaction: Milinda Cave, PA-C Was agent that likely caused hypersensitivity reaction added to Allergies List within EMR? Yes Chain of events including reaction signs/symptoms, treatment administered, and outcome (e.g., drug resumed; drug discontinued; sent to Emergency Department; etc.)  1218 Pt c/o headache 6/10, body aches in her shoulders/neck/back, and feeling "jittery". This RN paused Privigen infusion and flushed Pt's PIV with D5W.  1221 VS obtained; RN noticed elevated BP compared to Pt's baseline. This RN notified Sonda Rumble in [facility].  1227 This RN changed Pt's IV line and started 1L of normal saline hung to gravity. Pt c/o worsening headache and Pt stated "I am aching all over now" 1230 Pepcid administered.  1232 Pepcid infusion completed.  1235 Pt stated "my headache is getting better". Sonda Rumble chairside assessing Pt. 1250 Sonda Rumble made Dr. Chryl Heck aware.  1257 Pt back to baseline. Pt stated "my headache is gone and I do not feel like my skin is crawling anymore".  82 Dr. Chryl Heck informed this RN to restart infusion at the rate prior to the one the Pt reacted during.  1315 Normal saline infusion stopped; Pt's PIV line was changed and flushed with D5W. 1319 This RN restarted Privigen infusion at slower rate of 73 mL/hr.   Pt remained A&O throughout the reaction.  See MAR and VS flowsheets for respective information.  Nira Retort, RN 01/15/2022 12:59 PM

## 2022-01-15 NOTE — Progress Notes (Signed)
Patient DC'd in stable condition without further incident, VS WNL.

## 2022-01-15 NOTE — Patient Instructions (Signed)

## 2022-01-29 ENCOUNTER — Ambulatory Visit: Payer: Medicare Other | Admitting: Family Medicine

## 2022-02-02 ENCOUNTER — Ambulatory Visit: Payer: Medicare Other | Admitting: Family Medicine

## 2022-02-04 DIAGNOSIS — H52203 Unspecified astigmatism, bilateral: Secondary | ICD-10-CM | POA: Diagnosis not present

## 2022-02-04 DIAGNOSIS — H353122 Nonexudative age-related macular degeneration, left eye, intermediate dry stage: Secondary | ICD-10-CM | POA: Diagnosis not present

## 2022-02-04 DIAGNOSIS — H02834 Dermatochalasis of left upper eyelid: Secondary | ICD-10-CM | POA: Diagnosis not present

## 2022-02-04 DIAGNOSIS — H524 Presbyopia: Secondary | ICD-10-CM | POA: Diagnosis not present

## 2022-02-04 DIAGNOSIS — H02831 Dermatochalasis of right upper eyelid: Secondary | ICD-10-CM | POA: Diagnosis not present

## 2022-02-04 DIAGNOSIS — H04123 Dry eye syndrome of bilateral lacrimal glands: Secondary | ICD-10-CM | POA: Diagnosis not present

## 2022-02-04 DIAGNOSIS — Z961 Presence of intraocular lens: Secondary | ICD-10-CM | POA: Diagnosis not present

## 2022-02-09 ENCOUNTER — Ambulatory Visit: Payer: Medicare Other | Admitting: Family Medicine

## 2022-02-09 ENCOUNTER — Telehealth: Payer: Self-pay | Admitting: Family Medicine

## 2022-02-09 NOTE — Telephone Encounter (Signed)
Same day cancellation because of predicted bad weather. OV with Dr. Ethelene Hal was resched. First no show, letter sent

## 2022-02-13 ENCOUNTER — Encounter: Payer: Self-pay | Admitting: Oncology

## 2022-02-16 ENCOUNTER — Ambulatory Visit: Payer: Medicare Other | Admitting: Family Medicine

## 2022-02-16 NOTE — Telephone Encounter (Signed)
Cancelled due to weather - no charge/not counted as no show

## 2022-02-19 ENCOUNTER — Encounter: Payer: Self-pay | Admitting: Oncology

## 2022-02-19 ENCOUNTER — Encounter: Payer: Self-pay | Admitting: Family Medicine

## 2022-02-19 ENCOUNTER — Ambulatory Visit (INDEPENDENT_AMBULATORY_CARE_PROVIDER_SITE_OTHER): Payer: Medicare HMO | Admitting: Family Medicine

## 2022-02-19 VITALS — BP 112/66 | HR 63 | Temp 97.3°F | Ht 63.0 in | Wt 132.4 lb

## 2022-02-19 DIAGNOSIS — J4520 Mild intermittent asthma, uncomplicated: Secondary | ICD-10-CM

## 2022-02-19 DIAGNOSIS — J309 Allergic rhinitis, unspecified: Secondary | ICD-10-CM | POA: Diagnosis not present

## 2022-02-19 DIAGNOSIS — J45909 Unspecified asthma, uncomplicated: Secondary | ICD-10-CM | POA: Insufficient documentation

## 2022-02-19 MED ORDER — PREDNISONE 10 MG PO TABS: 10.0000 mg | ORAL_TABLET | Freq: Every day | ORAL | 0 refills | Status: AC

## 2022-02-19 MED ORDER — ALBUTEROL SULFATE HFA 108 (90 BASE) MCG/ACT IN AERS: INHALATION_SPRAY | RESPIRATORY_TRACT | 2 refills | Status: DC

## 2022-02-19 MED ORDER — PROMETHAZINE-DM 6.25-15 MG/5ML PO SYRP: 5.0000 mL | ORAL_SOLUTION | Freq: Four times a day (QID) | ORAL | 0 refills | Status: DC | PRN

## 2022-02-19 NOTE — Progress Notes (Signed)
Established Patient Office Visit   Subjective:  Patient ID: Kimberly Merritt, female    DOB: 06-Nov-1940  Age: 82 y.o. MRN: [id number]  Chief Complaint  Patient presents with   Follow-up    6 month follow up, have started coughing a lot with runny nose.     HPI Encounter Diagnoses  Name Primary?   Mild intermittent reactive airway disease without complication Yes   Allergic rhinitis, unspecified seasonality, unspecified trigger    1 day history of fatigue, postnasal drip, cough with some tightness and burning in the chest.  There has been no fever or chills, myalgias, arthralgias, headaches or sore throat.  History of allergy rhinitis.  She does heat with wood in her home.   Review of Systems  Constitutional:  Positive for malaise/fatigue. Negative for chills and fever.  HENT: Negative.  Negative for sore throat.   Eyes:  Negative for blurred vision, discharge and redness.  Respiratory:  Positive for cough. Negative for sputum production and shortness of breath.   Cardiovascular: Negative.   Gastrointestinal:  Negative for abdominal pain.  Genitourinary: Negative.   Musculoskeletal: Negative.  Negative for joint pain and myalgias.  Skin:  Negative for rash.  Neurological:  Negative for tingling, loss of consciousness, weakness and headaches.  Endo/Heme/Allergies:  Negative for polydipsia.     Current Outpatient Medications:    albuterol (VENTOLIN HFA) 108 (90 Base) MCG/ACT inhaler, May take 1 puff every 6 hours as needed for cough and or tightness in the chest., Disp: 8 g, Rfl: 2   carvedilol (COREG) 12.5 MG tablet, Take 1 tablet (12.5 mg total) by mouth 2 (two) times daily., Disp: 180 tablet, Rfl: 3   cetirizine (ZYRTEC) 10 MG tablet, Take 10 mg by mouth daily., Disp: , Rfl:    Cholecalciferol (VITAMIN D3) 50 MCG (2000 UT) capsule, Take 1 capsule by mouth daily., Disp: , Rfl:    cimetidine (TAGAMET) 200 MG tablet, Take 200 mg by mouth daily., Disp: , Rfl:    cromolyn  (NASALCROM) 5.2 MG/ACT nasal spray, Place 1 spray into both nostrils 4 (four) times daily., Disp: 26 mL, Rfl: 5   Glucosamine-Chondroitin 1500-1200 MG/30ML LIQD, Take 1 tablet by mouth daily., Disp: , Rfl:    losartan (COZAAR) 25 MG tablet, TAKE 1 TABLET BY MOUTH DAILY., Disp: 90 tablet, Rfl: 2   pantoprazole (PROTONIX) 40 MG tablet, Take 40 mg by mouth daily., Disp: , Rfl:    predniSONE (DELTASONE) 10 MG tablet, Take 1 tablet (10 mg total) by mouth daily with breakfast for 7 days., Disp: 7 tablet, Rfl: 0   promethazine-dextromethorphan (PROMETHAZINE-DM) 6.25-15 MG/5ML syrup, Take 5 mLs by mouth 4 (four) times daily as needed for cough., Disp: 118 mL, Rfl: 0   sucralfate (CARAFATE) 1 g tablet, Take 1 g by mouth daily as needed. , Disp: , Rfl:    SYNTHROID 25 MCG tablet, Take 1 tablet (25 mcg total) by mouth daily before breakfast., Disp: 90 tablet, Rfl: 1   tiZANidine (ZANAFLEX) 2 MG tablet, Take 2 mg by mouth at bedtime as needed., Disp: , Rfl:    guaiFENesin (MUCINEX) 600 MG 12 hr tablet, Take 1 tablet (600 mg total) by mouth 2 (two) times daily as needed. (Patient not taking: Reported on 02/19/2022), Disp: 20 tablet, Rfl: 0   Objective:     BP 112/66 (BP Location: Left Arm, Patient Position: Sitting, Cuff Size: Normal)   Pulse 63   Temp (!) 97.3 F (36.3 C) (Temporal)   Ht  $'5\' 3"'d$  (1.6 m)   Wt 132 lb 6.4 oz (60.1 kg)   SpO2 98%   BMI 23.45 kg/m    Physical Exam   No results found for any visits on 02/19/22.    The ASCVD Risk score (Arnett DK, et al., 2019) failed to calculate for the following reasons:   The 2019 ASCVD risk score is only valid for ages 76 to 40    Assessment & Plan:   Mild intermittent reactive airway disease without complication -     predniSONE; Take 1 tablet (10 mg total) by mouth daily with breakfast for 7 days.  Dispense: 7 tablet; Refill: 0 -     Albuterol Sulfate HFA; May take 1 puff every 6 hours as needed for cough and or tightness in the chest.   Dispense: 8 g; Refill: 2  Allergic rhinitis, unspecified seasonality, unspecified trigger -     predniSONE; Take 1 tablet (10 mg total) by mouth daily with breakfast for 7 days.  Dispense: 7 tablet; Refill: 0 -     Promethazine-DM; Take 5 mLs by mouth 4 (four) times daily as needed for cough.  Dispense: 118 mL; Refill: 0    Return in about 1 week (around 02/26/2022).    Libby Maw, MD

## 2022-03-02 ENCOUNTER — Encounter (INDEPENDENT_AMBULATORY_CARE_PROVIDER_SITE_OTHER): Payer: Medicare Other | Admitting: Ophthalmology

## 2022-03-18 DIAGNOSIS — H353132 Nonexudative age-related macular degeneration, bilateral, intermediate dry stage: Secondary | ICD-10-CM | POA: Diagnosis not present

## 2022-03-18 DIAGNOSIS — H35363 Drusen (degenerative) of macula, bilateral: Secondary | ICD-10-CM | POA: Diagnosis not present

## 2022-03-18 DIAGNOSIS — H43811 Vitreous degeneration, right eye: Secondary | ICD-10-CM | POA: Diagnosis not present

## 2022-03-30 ENCOUNTER — Other Ambulatory Visit: Payer: Self-pay | Admitting: *Deleted

## 2022-03-30 ENCOUNTER — Telehealth: Payer: Self-pay | Admitting: *Deleted

## 2022-03-30 NOTE — Telephone Encounter (Signed)
This RN spoke with per her call about upcoming appointment in May for IVIG.  She states she received a letter from her insurance company that [place] has been approved.  She states concern due to prior infusion in Dec 2023 of privigen that she had an reaction to.  Noted Privigen has been added to her allergy/contraindication list.  In addition to above- she stated she may need to change date of infusion to allow for earlier start time due to possible need for longer infusion time.  Noted per chart pt had reaction approximately 1 hour post rate at cap of 150m/hr.  IVIG stopped - IV pepcid given with resolution of issues- and medication restarted at 769mhr - with no further increases and no further reactions.  Pt did receive 12.5 mg of benadryl pre med before starting the IVIG.  This note will be forwarded to MD for review for appropriate orders.  LOS will be sent to change date of infusion to follow day to start early start.

## 2022-03-31 DIAGNOSIS — Z5919 Other inadequate housing: Secondary | ICD-10-CM | POA: Diagnosis not present

## 2022-03-31 DIAGNOSIS — J309 Allergic rhinitis, unspecified: Secondary | ICD-10-CM | POA: Diagnosis not present

## 2022-03-31 DIAGNOSIS — Z008 Encounter for other general examination: Secondary | ICD-10-CM | POA: Diagnosis not present

## 2022-03-31 DIAGNOSIS — K219 Gastro-esophageal reflux disease without esophagitis: Secondary | ICD-10-CM | POA: Diagnosis not present

## 2022-03-31 DIAGNOSIS — I110 Hypertensive heart disease with heart failure: Secondary | ICD-10-CM | POA: Diagnosis not present

## 2022-03-31 DIAGNOSIS — Z809 Family history of malignant neoplasm, unspecified: Secondary | ICD-10-CM | POA: Diagnosis not present

## 2022-03-31 DIAGNOSIS — Z833 Family history of diabetes mellitus: Secondary | ICD-10-CM | POA: Diagnosis not present

## 2022-03-31 DIAGNOSIS — E039 Hypothyroidism, unspecified: Secondary | ICD-10-CM | POA: Diagnosis not present

## 2022-03-31 DIAGNOSIS — Z85828 Personal history of other malignant neoplasm of skin: Secondary | ICD-10-CM | POA: Diagnosis not present

## 2022-03-31 DIAGNOSIS — Z823 Family history of stroke: Secondary | ICD-10-CM | POA: Diagnosis not present

## 2022-03-31 DIAGNOSIS — I509 Heart failure, unspecified: Secondary | ICD-10-CM | POA: Diagnosis not present

## 2022-03-31 DIAGNOSIS — Z8249 Family history of ischemic heart disease and other diseases of the circulatory system: Secondary | ICD-10-CM | POA: Diagnosis not present

## 2022-03-31 DIAGNOSIS — Z888 Allergy status to other drugs, medicaments and biological substances status: Secondary | ICD-10-CM | POA: Diagnosis not present

## 2022-04-05 ENCOUNTER — Telehealth: Payer: Self-pay | Admitting: Hematology and Oncology

## 2022-04-05 NOTE — Telephone Encounter (Signed)
Spoke with patient confirming appointment change

## 2022-04-08 ENCOUNTER — Other Ambulatory Visit: Payer: Self-pay | Admitting: Family Medicine

## 2022-04-08 DIAGNOSIS — H57813 Brow ptosis, bilateral: Secondary | ICD-10-CM | POA: Diagnosis not present

## 2022-04-08 DIAGNOSIS — H02831 Dermatochalasis of right upper eyelid: Secondary | ICD-10-CM | POA: Diagnosis not present

## 2022-04-08 DIAGNOSIS — H02423 Myogenic ptosis of bilateral eyelids: Secondary | ICD-10-CM | POA: Diagnosis not present

## 2022-04-08 DIAGNOSIS — H02834 Dermatochalasis of left upper eyelid: Secondary | ICD-10-CM | POA: Diagnosis not present

## 2022-04-08 DIAGNOSIS — H0279 Other degenerative disorders of eyelid and periocular area: Secondary | ICD-10-CM | POA: Diagnosis not present

## 2022-04-08 DIAGNOSIS — H53483 Generalized contraction of visual field, bilateral: Secondary | ICD-10-CM | POA: Diagnosis not present

## 2022-04-08 DIAGNOSIS — E039 Hypothyroidism, unspecified: Secondary | ICD-10-CM

## 2022-04-08 DIAGNOSIS — H02413 Mechanical ptosis of bilateral eyelids: Secondary | ICD-10-CM | POA: Diagnosis not present

## 2022-04-13 ENCOUNTER — Telehealth: Payer: Self-pay

## 2022-04-13 NOTE — Telephone Encounter (Signed)
error 

## 2022-04-13 NOTE — Telephone Encounter (Signed)
Called pt to get scheduled for follow-up appt with PCP. Pt states she will have to call back to schedule.

## 2022-04-15 ENCOUNTER — Telehealth: Payer: Self-pay | Admitting: Family Medicine

## 2022-04-15 NOTE — Telephone Encounter (Signed)
No calls to patient on clinical side patient states that someone called her about an appointment to be rescheduled but this is no longer needed due to patient TOC to a doctor closer to her home.

## 2022-04-15 NOTE — Telephone Encounter (Signed)
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 [employer] was paid 100%. Pt aware.

## 2022-04-15 NOTE — Telephone Encounter (Signed)
Pt returned your call. Ermalinda [phone]

## 2022-04-20 ENCOUNTER — Encounter: Payer: Self-pay | Admitting: Family Medicine

## 2022-04-20 ENCOUNTER — Ambulatory Visit (INDEPENDENT_AMBULATORY_CARE_PROVIDER_SITE_OTHER): Payer: Medicare HMO | Admitting: Family Medicine

## 2022-04-20 VITALS — BP 115/72 | HR 76 | Ht 63.0 in | Wt 126.0 lb

## 2022-04-20 DIAGNOSIS — Z7689 Persons encountering health services in other specified circumstances: Secondary | ICD-10-CM

## 2022-04-20 DIAGNOSIS — E039 Hypothyroidism, unspecified: Secondary | ICD-10-CM

## 2022-04-20 DIAGNOSIS — J309 Allergic rhinitis, unspecified: Secondary | ICD-10-CM

## 2022-04-20 MED ORDER — FLUTICASONE PROPIONATE 50 MCG/ACT NA SUSP: 2.0000 | Freq: Every day | NASAL | 6 refills | Status: DC

## 2022-04-20 NOTE — Assessment & Plan Note (Signed)
Getting labs Continue thyroid replacement.  Check tsh yearly

## 2022-04-20 NOTE — Patient Instructions (Addendum)
It was nice to meet you today,   For your allergies I have prescribed a nasal steroid spray to use daily.  Use it for at least 4 weeks before you decide if it is helping or not.  You can use it with the nasacrom. They work by different mechanisms.    I will check your thyroid levels.  We will let you know the results when we get them.    If you would like to discuss advanced directives such as a living will we can schedule an appointment for that.    Have a great day,   Dr. Jeannine Kitten

## 2022-04-20 NOTE — Assessment & Plan Note (Signed)
Allergic sinusitis.  Tenderness over the left ethmoid and frontal sinus cavities.  - added fluticasone daily - can continue nasalcrom - 2nd gen antihistamine prn

## 2022-04-20 NOTE — Progress Notes (Unsigned)
New Patient Office Visit  Subjective    Patient ID: Kimberly Merritt, female    DOB: March 06, 1940  Age: 82 y.o. MRN: [id number]  CC:  Chief Complaint  Patient presents with   Establish Care   Sinusitis   Allergies    HPI Kimberly Merritt presents to establish care  She was previously a patient here but her physician left and she then went to Dr. Ethelene Hal in [place] for medical care.  She sees several specialists currently incudling cardiology, oncology (dr. Myles Gip), allerist (dr. Wynelle Cleveland), dermatologist (dr. Winifred Olive), orthopedics (dr. Rhona Raider), gastroenterology ( dr. Watt Climes), vascular ( dr. Doren Custard), and two eye doctors ( dr. Laverna Peace and Dr. Zadie Rhine).     Patient has a history of allergies, which have been acting up now that the trees are pollinating.  She has a hx of gerd, MGUS, HFrEF,arthritis and hypothyroid.  Her previous pcp, Dr. Ethelene Hal, prescribed her albuterol for a cough but she does not take it because it made her heart race.  Had spirometry testing recently and this was normal.   Pt lives alone and has no children.  Had a previous hcpoa but not currently since  her previous one developed cancer.     Outpatient Encounter Medications as of 04/20/2022  Medication Sig   carvedilol (COREG) 12.5 MG tablet Take 1 tablet (12.5 mg total) by mouth 2 (two) times daily.   cetirizine (ZYRTEC) 10 MG tablet Take 10 mg by mouth daily.   Cholecalciferol (VITAMIN D3) 50 MCG (2000 UT) capsule Take 1 capsule by mouth daily.   cimetidine (TAGAMET) 200 MG tablet Take 200 mg by mouth daily.   cromolyn (NASALCROM) 5.2 MG/ACT nasal spray Place 1 spray into both nostrils 4 (four) times daily.   fluticasone (FLONASE) 50 MCG/ACT nasal spray Place 2 sprays into both nostrils daily.   Glucosamine-Chondroitin 1500-1200 MG/30ML LIQD Take 1 tablet by mouth daily.   losartan (COZAAR) 25 MG tablet TAKE 1 TABLET BY MOUTH DAILY.   pantoprazole (PROTONIX) 40 MG tablet Take 40 mg by mouth daily.   sucralfate  (CARAFATE) 1 g tablet Take 1 g by mouth daily as needed.    SYNTHROID 25 MCG tablet TAKE 1 TABLET (25 MCG TOTAL) BY MOUTH DAILY BEFORE BREAKFAST.   tiZANidine (ZANAFLEX) 2 MG tablet Take 2 mg by mouth at bedtime as needed.   [DISCONTINUED] albuterol (VENTOLIN HFA) 108 (90 Base) MCG/ACT inhaler May take 1 puff every 6 hours as needed for cough and or tightness in the chest. (Patient not taking: Reported on 04/20/2022)   [DISCONTINUED] guaiFENesin (MUCINEX) 600 MG 12 hr tablet Take 1 tablet (600 mg total) by mouth 2 (two) times daily as needed. (Patient not taking: Reported on 02/19/2022)   [DISCONTINUED] promethazine-dextromethorphan (PROMETHAZINE-DM) 6.25-15 MG/5ML syrup Take 5 mLs by mouth 4 (four) times daily as needed for cough. (Patient not taking: Reported on 04/20/2022)   No facility-administered encounter medications on file as of 04/20/2022.    Past Medical History:  Diagnosis Date   Allergy    CAD (coronary artery disease)    Nonobstructive. LAD 30% stenosis, small PDA of the circumflex 70% stenosis, right coronary artery 40% stenosis. 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   LOW BACK PAIN 08/29/2006   MONOCLONAL GAMMOPATHY 03/03/2010   MUSCLE SPASM 03/03/2010   Nonischemic cardiomyopathy (  [place])    EF 30%   OSTEOARTHRITIS 08/29/2006   OSTEOPENIA 11/19/2008   PARESTHESIA 03/05/2009   PVC's (premature ventricular contractions)    Scoliosis    VARICOSE VEINS, LOWER EXTREMITIES 08/29/2006    Past Surgical History:  Procedure Laterality Date   ABDOMINAL HYSTERECTOMY  1981   APPENDECTOMY     BACK SURGERY  2010,2006   lumb fusion   BUNIONECTOMY     both   BUNIONECTOMY Right 1985   CARPAL TUNNEL RELEASE     both   CARPAL TUNNEL RELEASE Right 1992   CARPAL TUNNEL RELEASE Left 1992   CATARACT  EXTRACTION     both   CATARACT EXTRACTION Right 2008   CATARACT EXTRACTION Left 2008   HAMMER TOE SURGERY Left 1987   HAMMER TOE SURGERY Right 1986   LEFT AND RIGHT HEART CATHETERIZATION WITH CORONARY ANGIOGRAM N/A 10/04/2013   Procedure: LEFT AND RIGHT HEART CATHETERIZATION WITH CORONARY ANGIOGRAM;  Surgeon: Leonie Man, MD;  Location: [facility] CATH LAB;  Service: Cardiovascular;  Laterality: N/A;   LUMBAR FUSION  2006   LUMBAR FUSION  2010   MOHS SURGERY     right cheek   OOPHORECTOMY  1967   OVARIAN CYST REMOVAL     ROTATOR CUFF REPAIR Right 2013   SHOULDER ARTHROSCOPY  02/09/2011   Procedure: ARTHROSCOPY SHOULDER;  Surgeon: Hessie Dibble, MD;  Location: [place];  Service: Orthopedics;  Laterality: Right;  right shoulder arthroscopy subacromial decompression with rotator cuff repair   TONSILLECTOMY      Family History  Problem Relation Age of Onset   Stroke Mother    Cancer Father    Emphysema Father    Hypertension Other    Breast cancer Neg Hx     Social History   Socioeconomic History   Marital status: Widowed    Spouse name: Not on file   Number of children: 0   Years of education: Not on file   Highest education level: Not on file  Occupational History   Occupation: Retired  Tobacco Use   Smoking status: Never   Smokeless tobacco: Never  Vaping Use   Vaping Use: Never used  Substance and Sexual Activity   Alcohol use: No    Alcohol/week: 0.0 standard drinks of alcohol   Drug use: No   Sexual activity: Not Currently  Other Topics Concern   Not on file  Social History Narrative   Lives alone.     Social Determinants of Health   Financial Resource Strain: Low Risk  (09/08/2021)   Overall Financial Resource Strain (CARDIA)    Difficulty of Paying Living Expenses: Not hard at all  Food Insecurity: No Food Insecurity (09/08/2021)   Hunger Vital Sign    Worried About Running Out of Food in the Last Year: Never true    Ran Out of Food in  the Last Year: Never true  Transportation Needs: No Transportation Needs (09/08/2021)   PRAPARE - Hydrologist (Medical): No    Lack of Transportation (Non-Medical): No  Physical Activity: Insufficiently Active (09/08/2021)   Exercise Vital Sign    Days of Exercise per Week: 5 days    Minutes of Exercise per Session: 20 min  Stress: No Stress Concern Present (09/08/2021)   [place]    Feeling of Stress : Not at all  Social Connections: Unknown (09/08/2021)   Social Connection and Isolation Panel [NHANES]  Frequency of Communication with Friends and Family: More than three times a week    Frequency of Social Gatherings with Friends and Family: Not on file    Attends Religious Services: Not on file    Active Member of Clubs or Organizations: Not on file    Attends Archivist Meetings: Not on file    Marital Status: Not on file  Intimate Partner Violence: Not At Risk (09/08/2021)   Humiliation, Afraid, Rape, and Kick questionnaire    Fear of Current or Ex-Partner: No    Emotionally Abused: No    Physically Abused: No    Sexually Abused: No    ROS      Objective    BP 115/72   Pulse 76   Ht 5\' 3"  (1.6 m)   Wt 126 lb (57.2 kg)   SpO2 100% Comment: on RA  BMI 22.32 kg/m   Physical Exam Constitutional:      Appearance: Normal appearance.  HENT:     Head: Normocephalic.     Right Ear: Tympanic membrane normal.     Left Ear: Tympanic membrane normal.     Nose: Nose normal.     Mouth/Throat:     Mouth: Mucous membranes are moist.     Pharynx: Oropharynx is clear. No posterior oropharyngeal erythema.  Eyes:     Extraocular Movements: Extraocular movements intact.  Cardiovascular:     Rate and Rhythm: Normal rate and regular rhythm.     Pulses: Normal pulses.  Pulmonary:     Effort: Pulmonary effort is normal. No respiratory distress.     Breath sounds: Normal breath  sounds.  Abdominal:     General: Abdomen is flat. Bowel sounds are normal.     Palpations: Abdomen is soft.  Musculoskeletal:        General: No swelling.     Cervical back: Normal range of motion.     Comments: Deformity of hands bilaterally.  Heberdens nodes present.   Skin:    General: Skin is warm and dry.  Neurological:     Mental Status: She is alert.     {Labs (Optional):23779}    Assessment & Plan:   Problem List Items Addressed This Visit       Respiratory   Allergic rhinitis (Chronic)    Allergic sinusitis.  Tenderness over the left ethmoid and frontal sinus cavities.  - added fluticasone daily - can continue nasalcrom - 2nd gen antihistamine prn        Endocrine   Hypothyroidism (acquired) - Primary (Chronic)    Getting labs Continue thyroid replacement.  Check tsh yearly      Relevant Orders   TSH   T3   T4, free    Return in about 6 months (around 10/21/2022) for well visit.   Benay Pike, MD

## 2022-04-21 LAB — T3: T3, Total: 90 ng/dL (ref 71–180)

## 2022-04-21 LAB — T4, FREE: Free T4: 1.44 ng/dL (ref 0.82–1.77)

## 2022-04-21 LAB — TSH: TSH: 2.03 u[IU]/mL (ref 0.450–4.500)

## 2022-04-28 DIAGNOSIS — H53483 Generalized contraction of visual field, bilateral: Secondary | ICD-10-CM | POA: Diagnosis not present

## 2022-05-26 DIAGNOSIS — L538 Other specified erythematous conditions: Secondary | ICD-10-CM | POA: Diagnosis not present

## 2022-05-26 DIAGNOSIS — D485 Neoplasm of uncertain behavior of skin: Secondary | ICD-10-CM | POA: Diagnosis not present

## 2022-05-26 DIAGNOSIS — R208 Other disturbances of skin sensation: Secondary | ICD-10-CM | POA: Diagnosis not present

## 2022-05-26 DIAGNOSIS — C44629 Squamous cell carcinoma of skin of left upper limb, including shoulder: Secondary | ICD-10-CM | POA: Diagnosis not present

## 2022-05-26 DIAGNOSIS — Z789 Other specified health status: Secondary | ICD-10-CM | POA: Diagnosis not present

## 2022-05-26 DIAGNOSIS — D1801 Hemangioma of skin and subcutaneous tissue: Secondary | ICD-10-CM | POA: Diagnosis not present

## 2022-05-26 DIAGNOSIS — L814 Other melanin hyperpigmentation: Secondary | ICD-10-CM | POA: Diagnosis not present

## 2022-05-26 DIAGNOSIS — Z08 Encounter for follow-up examination after completed treatment for malignant neoplasm: Secondary | ICD-10-CM | POA: Diagnosis not present

## 2022-05-26 DIAGNOSIS — L821 Other seborrheic keratosis: Secondary | ICD-10-CM | POA: Diagnosis not present

## 2022-05-26 DIAGNOSIS — R229 Localized swelling, mass and lump, unspecified: Secondary | ICD-10-CM | POA: Diagnosis not present

## 2022-05-26 DIAGNOSIS — L820 Inflamed seborrheic keratosis: Secondary | ICD-10-CM | POA: Diagnosis not present

## 2022-05-26 DIAGNOSIS — C44719 Basal cell carcinoma of skin of left lower limb, including hip: Secondary | ICD-10-CM | POA: Diagnosis not present

## 2022-05-26 DIAGNOSIS — C44729 Squamous cell carcinoma of skin of left lower limb, including hip: Secondary | ICD-10-CM | POA: Diagnosis not present

## 2022-05-26 DIAGNOSIS — C44712 Basal cell carcinoma of skin of right lower limb, including hip: Secondary | ICD-10-CM | POA: Diagnosis not present

## 2022-06-16 ENCOUNTER — Other Ambulatory Visit: Payer: Medicare Other

## 2022-06-16 ENCOUNTER — Ambulatory Visit: Payer: Medicare Other | Admitting: Hematology and Oncology

## 2022-06-16 ENCOUNTER — Ambulatory Visit: Payer: Medicare Other

## 2022-06-16 DIAGNOSIS — I872 Venous insufficiency (chronic) (peripheral): Secondary | ICD-10-CM | POA: Diagnosis not present

## 2022-06-16 DIAGNOSIS — C44712 Basal cell carcinoma of skin of right lower limb, including hip: Secondary | ICD-10-CM | POA: Diagnosis not present

## 2022-06-22 ENCOUNTER — Inpatient Hospital Stay: Payer: Medicare HMO | Attending: Hematology and Oncology

## 2022-06-22 ENCOUNTER — Inpatient Hospital Stay: Payer: Medicare HMO | Admitting: Hematology and Oncology

## 2022-06-22 ENCOUNTER — Inpatient Hospital Stay: Payer: Medicare HMO

## 2022-06-22 ENCOUNTER — Other Ambulatory Visit: Payer: Self-pay

## 2022-06-22 ENCOUNTER — Ambulatory Visit: Payer: Self-pay

## 2022-06-22 VITALS — BP 144/81 | HR 60 | Temp 97.2°F | Resp 17 | Wt 132.1 lb

## 2022-06-22 DIAGNOSIS — D472 Monoclonal gammopathy: Secondary | ICD-10-CM | POA: Diagnosis not present

## 2022-06-22 DIAGNOSIS — D801 Nonfamilial hypogammaglobulinemia: Secondary | ICD-10-CM | POA: Insufficient documentation

## 2022-06-22 DIAGNOSIS — Z780 Asymptomatic menopausal state: Secondary | ICD-10-CM | POA: Insufficient documentation

## 2022-06-22 DIAGNOSIS — I428 Other cardiomyopathies: Secondary | ICD-10-CM | POA: Insufficient documentation

## 2022-06-22 LAB — COMPREHENSIVE METABOLIC PANEL WITH GFR
ALT: 13 U/L (ref 0–44)
AST: 15 U/L (ref 15–41)
Albumin: 3.8 g/dL (ref 3.5–5.0)
Alkaline Phosphatase: 69 U/L (ref 38–126)
Anion gap: 4 — ABNORMAL LOW (ref 5–15)
BUN: 20 mg/dL (ref 8–23)
CO2: 30 mmol/L (ref 22–32)
Calcium: 8.7 mg/dL — ABNORMAL LOW (ref 8.9–10.3)
Chloride: 101 mmol/L (ref 98–111)
Creatinine, Ser: 0.82 mg/dL (ref 0.44–1.00)
GFR, Estimated: >60 mL/min
Glucose, Bld: 81 mg/dL (ref 70–99)
Potassium: 4.7 mmol/L (ref 3.5–5.1)
Sodium: 135 mmol/L (ref 135–145)
Total Bilirubin: 0.4 mg/dL (ref 0.3–1.2)
Total Protein: 6.9 g/dL (ref 6.5–8.1)

## 2022-06-22 LAB — CBC WITH DIFFERENTIAL/PLATELET
Abs Immature Granulocytes: 0.02 K/uL (ref 0.00–0.07)
Basophils Absolute: 0.1 K/uL (ref 0.0–0.1)
Basophils Relative: 1 %
Eosinophils Absolute: 0.2 K/uL (ref 0.0–0.5)
Eosinophils Relative: 2 %
HCT: 36.4 % (ref 36.0–46.0)
Hemoglobin: 11.7 g/dL — ABNORMAL LOW (ref 12.0–15.0)
Immature Granulocytes: 0 %
Lymphocytes Relative: 47 %
Lymphs Abs: 4.2 K/uL — ABNORMAL HIGH (ref 0.7–4.0)
MCH: 31 pg (ref 26.0–34.0)
MCHC: 32.1 g/dL (ref 30.0–36.0)
MCV: 96.3 fL (ref 80.0–100.0)
Monocytes Absolute: 1.3 K/uL — ABNORMAL HIGH (ref 0.1–1.0)
Monocytes Relative: 14 %
Neutro Abs: 3.3 K/uL (ref 1.7–7.7)
Neutrophils Relative %: 36 %
Platelets: 289 K/uL (ref 150–400)
RBC: 3.78 MIL/uL — ABNORMAL LOW (ref 3.87–5.11)
RDW: 11.9 % (ref 11.5–15.5)
WBC: 9 K/uL (ref 4.0–10.5)
nRBC: 0 % (ref 0.0–0.2)

## 2022-06-22 NOTE — Progress Notes (Signed)
[facility]  [phone] [phone]     ID: Kimberly Merritt DOB: 02-28-1940  MR#: [id number]  GNF#:[id number]  Patient Care Team: Sandre Kitty, MD as PCP - General (Family Medicine) Rollene Rotunda, MD as PCP - Cardiology (Cardiology) Marcene Corning, MD as Attending Physician (Orthopedic Surgery) Vida Rigger, MD (Gastroenterology) Jethro Bolus, MD as Attending Physician (Ophthalmology) Shirlean Kelly, MD as Consulting Physician (Neurosurgery) Rollene Rotunda, MD as Consulting Physician (Cardiology) Elesa Hacker, MD as Referring Physician (Dermatology) Bjorn Pippin, MD as Attending Physician (Urology) Sidney Ace, MD as Referring Physician (Allergy) Sherren Kerns, MD (Inactive) as Consulting Physician (Vascular Surgery) Edmon Crape, MD as Consulting Physician (Ophthalmology) Rachel Moulds, MD as Consulting Physician (Hematology and Oncology) OTHER MD: Rollene Rotunda MD, Chilton Greathouse MD  CHIEF COMPLAINT: IgM M-GUS/ low-grade lymphoproliferative process  CURRENT TREATMENT: IVIG as needed; Evusheld  INTERVAL HISTORY  Kimberly Merritt returns today for 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  Since her last visit here, she has been doing quite well. She had IVIG in December, she feels like this has helped her from getting too sick during the winter. She says IVIG however is very expensive so she only wants to during winters. No other B symptoms.  Rest of the pertinent 10 point ROS reviewed and negative   COVID 19 VACCINATION STATUS: fully vaccinated [employer]), with booster 10/2019   HISTORY PRESENT ILLNESS::  from the earlier summary note:  "Kimberly Merritt" Merritt is a 82 y/o [place] woman I evaluated originally in October of 2004 for a monoclonal gammopathy. At that time she had a white cell count of 7.2, hemoglobin 12.8, MCV 94.5, and platelets 298,000. Protein workup showed an IgM kappa paraprotein of 0.6 g, with  a total IgG slightly depressed at 556, but a normal IgA at 82. Because an IgM paraprotein can be associated with a low grade lymphoproliferative process (Waldenstrm's macroglobulinemia) Kimberly Merritt underwent bone marrow biopsy 11/27/2002, which showed ([id number]) a normal cellular marrow with trilineage hematopoiesis and 5% plasma cells. The peripheral blood film showed no significant abnormalities and on the biopsy para-spicular lymphoid aggregates were not noted. 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  The patient was followed with observation alone through May of 2009, with no evidence of progression in her IgM kappa clone. As of 06/03/2007 her total IgM spike was 0.83, her IgG was 490 and her IgA was 36. The patient was released from followup here at that time.  More recently, as the 10 year anniversary of her original bone marrow biopsy past, Dr. Jennette Banker of referred the patient back for reassessment and possible bone marrow rebiopsy. Kimberly Merritt was scheduled here for lab work 09/25/2013. However when she had her blood drawn she complained of some shortness of breath and her pulse was checked. It was 33. The patient was referred to the emergency room where she was evaluated by Dr. Antoine Poche who felt the patient's true heart rate was higher (she has ectopic beats which generated a week pulse and may have been missed) since in the emergency room EKG her rate was 98 with normal sinus rhythm. Troponins were negative. BNP was slightly elevated.  Dr. Antoine Poche set the patient up for an echocardiogram 09/27/2013 and this showed an ejection fraction of 30-35%. There was diffuse hypokinesis. She underwent right and left heart catheterization 10/04/2013 which showed no significant obstructions. The study did confirm an ejection fraction of roughly 35% with global hypokinesis. She is followed by  Dr. Antoine Poche for her new diagnosis of non-ischemic cardiomyopathy.  Her  subsequent history is as detailed below   PAST MEDICAL HISTORY: Past Medical History:  Diagnosis Date   Allergy    CAD (coronary artery disease)    Nonobstructive. LAD 30% stenosis, small PDA of the circumflex 70% stenosis, right coronary artery 40% stenosis. 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   LOW BACK PAIN 08/29/2006   MONOCLONAL GAMMOPATHY 03/03/2010   MUSCLE SPASM 03/03/2010   Nonischemic cardiomyopathy (HCC)    EF 30%   OSTEOARTHRITIS 08/29/2006   OSTEOPENIA 11/19/2008   PARESTHESIA 03/05/2009   PVC's (premature ventricular contractions)    Scoliosis    VARICOSE VEINS, LOWER EXTREMITIES 08/29/2006    PAST SURGICAL HISTORY: Past Surgical History:  Procedure Laterality Date   ABDOMINAL HYSTERECTOMY  1981   APPENDECTOMY     BACK SURGERY  2008-07-14   lumb fusion   BUNIONECTOMY     both   BUNIONECTOMY Right 1985   CARPAL TUNNEL RELEASE     both   CARPAL TUNNEL RELEASE Right 1992   CARPAL TUNNEL RELEASE Left 1992   CATARACT EXTRACTION     both   CATARACT EXTRACTION Right 15-Jul-2006   CATARACT EXTRACTION Left 15-Jul-2006   HAMMER TOE SURGERY Left 1987   HAMMER TOE SURGERY Right 1986   LEFT AND RIGHT HEART CATHETERIZATION WITH CORONARY ANGIOGRAM N/A 10/04/2013   Procedure: LEFT AND RIGHT HEART CATHETERIZATION WITH CORONARY ANGIOGRAM;  Surgeon: Marykay Lex, MD;  Location: [facility] CATH [facility];  Service: Cardiovascular;  Laterality: N/A;   LUMBAR FUSION  07/14/2004   LUMBAR FUSION  07/14/08   MOHS SURGERY     right cheek   OOPHORECTOMY  1967   OVARIAN CYST REMOVAL     ROTATOR CUFF REPAIR Right July 15, 2011   SHOULDER ARTHROSCOPY  02/09/2011   Procedure: ARTHROSCOPY SHOULDER;  Surgeon: Velna Ochs, MD;  Location: [facility];  Service: Orthopedics;  Laterality: Right;  right shoulder arthroscopy subacromial decompression with rotator cuff repair   TONSILLECTOMY      FAMILY HISTORY Family History  Problem Relation Age of Onset   Stroke Mother    Cancer Father    Emphysema Father    Hypertension Other    Breast cancer Neg Hx    the patient's father died at the age of 35 from lung cancer in the setting of tobacco abuse. [name] mother died at the age of 22 following a stroke. The patient had one brother, who is severely retarded and died from pneumonia at the age of 74. There were no sisters. There is no history of blood problems or cancer in the family to the patient's knowledge   GYNECOLOGIC HISTORY:  No LMP recorded. Patient is postmenopausal. Menarche age 42. The patient is GX P0. She stopped having periods in 15-Jul-1978 and took hormone replacement for almost 20 years.   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 Her husband died in 07-15-1991 from a myocardial infarction.  She tells me she has no family.  She lives by herself, [place], with one cat for company.    ADVANCED DIRECTIVES: Her  healthcare power of attorney is Derald Macleod who can be reached at [phone] (cell), or [phone] (home).  Also if Kimberly Gower had an acute problem she would want [place] to contact her neighbor and third cousin Lavell Luster at [phone]    HEALTH MAINTENANCE: Social History   Tobacco Use   Smoking status: Never   Smokeless tobacco: Never  Vaping Use   Vaping Use: Never used  Substance Use Topics   Alcohol use: No    Alcohol/week: 0.0 standard drinks of alcohol   Drug use: No     Colonoscopy: 2014  PAP: 2013  Bone density: 11/2018, -2.4  Lipid panel:  Allergies  Allergen Reactions   Acetaminophen Rash    REACTION: high dose - tremor Per pt - rash on chest and heart palpitations   Privigen [Immune Globulin (Human)] Other (See Comments)    Pt  had a rxn; see hypersensitivity note from 01/15/22 at 1259.    Alendronate Sodium     Stomach upset.    Codeine     May interfere with heart condition     Diflunisal     Stomach pain.     Doxycycline     n/v   Flexeril [Cyclobenzaprine]     Abdominal pain.     Iodinated Contrast Media Other (See Comments)    Syncope.    Lisinopril Other (See Comments)   Nabumetone     Abdominal pain; dark, tarry stool.     Other Other (See Comments)   Oxybutynin     Pt does not remember.     Raloxifene     Unknown.     Sulfa Antibiotics Nausea Only    Syncope.     Symbicort [Budesonide-Formoterol Fumarate]     "Jittery-ness."    Tramadol Hcl     Numbness.     Tramadol Hcl Itching    Numbness.     Diclofenac Palpitations    Dark, tarry stool.     Latex Rash    Current Outpatient Medications  Medication Sig Dispense Refill   carvedilol (COREG) 12.5 MG tablet Take 1 tablet (12.5 mg total) by mouth 2 (two) times daily. 180 tablet 3   cetirizine (ZYRTEC) 10 MG tablet Take 10 mg by mouth daily.     Cholecalciferol (VITAMIN D3) 50 MCG (2000 UT) capsule Take 1 capsule by mouth daily.     cimetidine (TAGAMET) 200 MG tablet Take 200 mg by mouth daily.     cromolyn (NASALCROM) 5.2 MG/ACT nasal spray Place 1 spray into both nostrils 4 (four) times daily. 26 mL 5   fluticasone (FLONASE) 50 MCG/ACT nasal spray Place 2 sprays into both nostrils daily. 16 g 6   Glucosamine-Chondroitin 1500-1200 MG/30ML LIQD Take 1 tablet by mouth daily.     losartan (COZAAR) 25 MG tablet TAKE 1 TABLET BY MOUTH DAILY. 90 tablet 2   pantoprazole (PROTONIX) 40 MG tablet Take 40 mg by mouth daily.     sucralfate (CARAFATE) 1 g tablet Take 1 g by mouth daily as needed.      SYNTHROID 25 MCG tablet TAKE 1 TABLET (25 MCG TOTAL) BY MOUTH DAILY BEFORE BREAKFAST. 90 tablet 0   tiZANidine (ZANAFLEX) 2 MG tablet Take 2 mg by mouth at bedtime as needed.     No current facility-administered medications for this visit.     OBJECTIVE: white woman who appears stated age  Vitals:   06/22/22 1118  BP: (!) 144/81  Pulse: 60  Resp: 17  Temp: (!)  97.2 F (36.2 C)  SpO2: 98%     Body mass index is 23.4 kg/m.    ECOG FS:2 - Symptomatic, <50% confined to bed  Physical Exam Constitutional:      Appearance: Normal appearance.  Cardiovascular:     Rate and Rhythm: Normal rate and regular rhythm.     Pulses: Normal pulses.     Heart sounds: Normal heart sounds.  Musculoskeletal:     Cervical back: Normal range of motion and neck supple. No rigidity.     Comments: Severe arthritis of hands  Lymphadenopathy:     Cervical: No cervical adenopathy.  Neurological:     Mental Status: She is alert.     LAB RESULTS:  CMP     Component Value Date/Time   NA 136 12/16/2021 1050   NA 136 05/21/2020 1357   NA 136 11/04/2016 1115   K 4.1 12/16/2021 1050   K 4.8 11/04/2016 1115   CL 101 12/16/2021 1050   CO2 29 12/16/2021 1050   CO2 27 11/04/2016 1115   GLUCOSE 87 12/16/2021 1050   GLUCOSE 95 11/04/2016 1115   BUN 18 12/16/2021 1050   BUN 20 05/21/2020 1357   BUN 16.7 11/04/2016 1115   CREATININE 0.77 12/16/2021 1050   CREATININE 0.83 05/25/2021 1053   CREATININE 0.8 11/04/2016 1115   CALCIUM 8.9 12/16/2021 1050   CALCIUM 9.0 11/04/2016 1115   PROT 7.2 12/16/2021 1050   PROT 7.0 05/21/2020 1357   PROT 6.6 11/04/2016 1115   ALBUMIN 3.9 12/16/2021 1050   ALBUMIN 4.0 05/21/2020 1357   ALBUMIN 3.5 11/04/2016 1115   AST 16 12/16/2021 1050   AST 14 (L) 05/25/2021 1053   AST 23 11/04/2016 1115   ALT 15 12/16/2021 1050   ALT 12 05/25/2021 1053   ALT 18 11/04/2016 1115   ALKPHOS 71 12/16/2021 1050   ALKPHOS 82 11/04/2016 1115   BILITOT 0.4 12/16/2021 1050   BILITOT 0.4 05/25/2021 1053   BILITOT 0.40 11/04/2016 1115   GFRNONAA >60 12/16/2021 1050   GFRNONAA >60 05/25/2021 1053   GFRNONAA 69 10/02/2013 1234   GFRAA 74 01/23/2020 0822   GFRAA 80 10/02/2013 1234    I No results found for:  "SPEP", "UPEP"  Lab Results  Component Value Date   WBC 9.0 06/22/2022   NEUTROABS 3.3 06/22/2022   HGB 11.7 (L) 06/22/2022   HCT 36.4 06/22/2022   MCV 96.3 06/22/2022   PLT 289 06/22/2022      Chemistry      Component Value Date/Time   NA 136 12/16/2021 1050   NA 136 05/21/2020 1357   NA 136 11/04/2016 1115   K 4.1 12/16/2021 1050   K 4.8 11/04/2016 1115   CL 101 12/16/2021 1050   CO2 29 12/16/2021 1050   CO2 27 11/04/2016 1115   BUN 18 12/16/2021 1050   BUN 20 05/21/2020 1357   BUN 16.7 11/04/2016 1115   CREATININE 0.77 12/16/2021 1050   CREATININE 0.83 05/25/2021 1053   CREATININE 0.8 11/04/2016 1115   GLU 89 04/08/2016 0000      Component Value Date/Time   CALCIUM 8.9 12/16/2021 1050   CALCIUM 9.0 11/04/2016 1115   ALKPHOS 71 12/16/2021 1050   ALKPHOS 82 11/04/2016 1115   AST 16 12/16/2021 1050   AST 14 (L) 05/25/2021 1053   AST 23 11/04/2016 1115   ALT 15 12/16/2021 1050   ALT 12 05/25/2021 1053   ALT 18 11/04/2016 1115   BILITOT 0.4 12/16/2021  1050   BILITOT 0.4 05/25/2021 1053   BILITOT 0.40 11/04/2016 1115       No results found for: "LABCA2"  No components found for: "LABCA125"  No results for input(s): "INR" in the last 168 hours.  Urinalysis    Component Value Date/Time   COLORURINE LT. YELLOW 06/08/2011 0830   APPEARANCEUR Clear 05/14/2020 1519   LABSPEC 1.010 06/08/2011 0830   PHURINE 6.5 06/08/2011 0830   GLUCOSEU Negative 05/14/2020 1519   GLUCOSEU NEGATIVE 06/08/2011 0830   HGBUR NEGATIVE 06/08/2011 0830   BILIRUBINUR Negative 05/14/2020 1519   KETONESUR NEGATIVE 06/08/2011 0830   PROTEINUR Negative 05/14/2020 1519   PROTEINUR NEGATIVE 04/19/2008 1216   UROBILINOGEN 0.2 10/23/2018 1047   UROBILINOGEN 0.2 06/08/2011 0830   NITRITE Negative 05/14/2020 1519   NITRITE NEGATIVE 06/08/2011 0830   LEUKOCYTESUR Negative 05/14/2020 1519   STUDIES: No results found.  ASSESSMENT:   82 y.o. 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 In case of an infection, IVIG supplementation should be considered  (a) received IVIG October 2016  (b) repeated IVIG October 2018  (c) repeated November 2019 and November 2020  (d) Evusheld May 2022   PLAN:  She is here for follow up of IgM MGUS and hypogammaglobulinemia.  She tolerated IVIG well in December, she tells me that she felt better after receiving IVIG, did not get to any illnesses.  She today denies any other B symptoms.  No concerning review of systems or physical exam findings.  We have agreed to doing IVIG every Winter  She states it is too expensive if we do it more often for her. CBC otherwise today with no significant concerns.  Rest of the myeloma labs are pending.  She will return to clinic in 6 months or sooner as needed.  Total time spent: 30 minutes  *Total Encounter Time as defined by the Centers for Medicare and Medicaid Services includes, in addition to the face-to-face time of a patient visit (documented in the note above) non-face-to-face time: obtaining and reviewing outside history, ordering and reviewing medications, tests or procedures, care coordination (communications with other health care professionals or caregivers) and documentation in the medical record.

## 2022-06-23 ENCOUNTER — Ambulatory Visit: Payer: Self-pay

## 2022-06-23 LAB — KAPPA/LAMBDA LIGHT CHAINS
Kappa free light chain: 20.2 mg/L — ABNORMAL HIGH (ref 3.3–19.4)
Kappa, lambda light chain ratio: 3.74 — ABNORMAL HIGH (ref 0.26–1.65)
Lambda free light chains: 5.4 mg/L — ABNORMAL LOW (ref 5.7–26.3)

## 2022-06-23 LAB — VISCOSITY, SERUM: Viscosity, Serum: 1.9 rel.saline (ref 1.4–2.1)

## 2022-06-24 ENCOUNTER — Encounter: Payer: Self-pay | Admitting: Family Medicine

## 2022-06-24 ENCOUNTER — Ambulatory Visit (INDEPENDENT_AMBULATORY_CARE_PROVIDER_SITE_OTHER): Payer: Medicare HMO | Admitting: Family Medicine

## 2022-06-24 VITALS — BP 144/78 | HR 63 | Temp 98.0°F | Ht 63.0 in | Wt 132.8 lb

## 2022-06-24 DIAGNOSIS — J0110 Acute frontal sinusitis, unspecified: Secondary | ICD-10-CM | POA: Diagnosis not present

## 2022-06-24 MED ORDER — AMOXICILLIN-POT CLAVULANATE 875-125 MG PO TABS: 1.0000 | ORAL_TABLET | Freq: Two times a day (BID) | ORAL | 0 refills | Status: AC

## 2022-06-24 MED ORDER — AMOXICILLIN-POT CLAVULANATE 875-125 MG PO TABS: 1.0000 | ORAL_TABLET | Freq: Two times a day (BID) | ORAL | 0 refills | Status: DC

## 2022-06-24 NOTE — Progress Notes (Signed)
   Acute Office Visit  Subjective:     Patient ID: Kimberly Merritt, female    DOB: Jun 04, 1940, 82 y.o.   MRN: [id number]  Chief Complaint  Patient presents with   Sinus Problem    HPI Patient is in today for sinus pressure, watery eyes.   Started on Tuesday night with productive cough.  Also having watery eyes, sinus pressure, a light pain in her right ear, sneezing.  Currently taking claritin-D, and taking nasal spray we prescribed earlier.  Also using nasalcrom occasionally.  Uses saline nasal irrigation twice daily at baseline.  Took some mucinex some yesterday.   Having 'chills' last night and early this morning but no fever.  Patient has a scheduled IVIG infusion for November.  Dr. Nile Riggs, patient's eye doctor who performed her cataract procedure, is retiring and patient wanted to know if there was another provider we should refer her to.  Discussed talking to his office for list of providers that they may recommend.  ROS      Objective:    BP (!) 144/78   Pulse 63   Temp 98 F (36.7 C) (Oral)   Ht 5\' 3"  (1.6 m)   Wt 132 lb 12 oz (60.2 kg)   SpO2 99%   BMI 23.52 kg/m    Physical Exam General: HEENT: Rhinorrhea, CV: Regular rate rhythm Pulmonary: Lungs clear bilaterally no crackles.  Good air movement.  Coughing.  No results found for any visits on 06/24/22.      Assessment & Plan:   Problem List Items Addressed This Visit       Respiratory   Sinusitis, acute frontal - Primary    Patient complains of acute sinus pressure in the frontal and maxillary sinuses with rhinorrhea, nasal congestion, cough.  Discussed with patient that her symptoms are most likely due to viral causes.  Patient is concerned regarding her hypogammaglobulinemia and immunodeficiency from this.  She would feel better with antibiotics in case it is a bacterial infection.  Discussed the risk and benefits with the patient.  Has tolerated Augmentin in the past multiple times.  Prescribed  patient 1 week Augmentin.      Relevant Medications   amoxicillin-clavulanate (AUGMENTIN) 875-125 MG tablet    Meds ordered this encounter  Medications   DISCONTD: amoxicillin-clavulanate (AUGMENTIN) 875-125 MG tablet    Sig: Take 1 tablet by mouth 2 (two) times daily for 7 days.    Dispense:  14 tablet    Refill:  0   amoxicillin-clavulanate (AUGMENTIN) 875-125 MG tablet    Sig: Take 1 tablet by mouth 2 (two) times daily for 7 days.    Dispense:  14 tablet    Refill:  0    Return if symptoms worsen or fail to improve.  Sandre Kitty, MD

## 2022-06-24 NOTE — Patient Instructions (Signed)
I have sent in your antibiotic.  Take it twice a day for the next 7 days.    Have a great day,   Dr. Constance Goltz

## 2022-06-25 NOTE — Assessment & Plan Note (Signed)
Patient complains of acute sinus pressure in the frontal and maxillary sinuses with rhinorrhea, nasal congestion, cough.  Discussed with patient that her symptoms are most likely due to viral causes.  Patient is concerned regarding her hypogammaglobulinemia and immunodeficiency from this.  She would feel better with antibiotics in case it is a bacterial infection.  Discussed the risk and benefits with the patient.  Has tolerated Augmentin in the past multiple times.  Prescribed patient 1 week Augmentin.

## 2022-07-01 DIAGNOSIS — Z48817 Encounter for surgical aftercare following surgery on the skin and subcutaneous tissue: Secondary | ICD-10-CM | POA: Diagnosis not present

## 2022-07-01 DIAGNOSIS — I872 Venous insufficiency (chronic) (peripheral): Secondary | ICD-10-CM | POA: Diagnosis not present

## 2022-07-01 DIAGNOSIS — D0472 Carcinoma in situ of skin of left lower limb, including hip: Secondary | ICD-10-CM | POA: Diagnosis not present

## 2022-07-01 DIAGNOSIS — Z4801 Encounter for change or removal of surgical wound dressing: Secondary | ICD-10-CM | POA: Diagnosis not present

## 2022-07-01 LAB — MULTIPLE MYELOMA PANEL, SERUM
Albumin SerPl Elph-Mcnc: 3.7 g/dL (ref 2.9–4.4)
Albumin/Glob SerPl: 1.2 (ref 0.7–1.7)
Alpha 1: 0.2 g/dL (ref 0.0–0.4)
Alpha2 Glob SerPl Elph-Mcnc: 0.6 g/dL (ref 0.4–1.0)
B-Globulin SerPl Elph-Mcnc: 2.1 g/dL — ABNORMAL HIGH (ref 0.7–1.3)
Gamma Glob SerPl Elph-Mcnc: 0.3 g/dL — ABNORMAL LOW (ref 0.4–1.8)
Globulin, Total: 3.2 g/dL (ref 2.2–3.9)
IgA: 11 mg/dL — ABNORMAL LOW (ref 64–422)
IgG (Immunoglobin G), Serum: 429 mg/dL — ABNORMAL LOW (ref 586–1602)
IgM (Immunoglobulin M), Srm: 2087 mg/dL — ABNORMAL HIGH (ref 26–217)
M Protein SerPl Elph-Mcnc: 1.5 g/dL — ABNORMAL HIGH
Total Protein ELP: 6.9 g/dL (ref 6.0–8.5)

## 2022-07-06 ENCOUNTER — Telehealth: Payer: Self-pay

## 2022-07-06 DIAGNOSIS — E039 Hypothyroidism, unspecified: Secondary | ICD-10-CM

## 2022-07-06 MED ORDER — SYNTHROID 25 MCG PO TABS: 25.0000 ug | ORAL_TABLET | Freq: Every day | ORAL | 1 refills | Status: DC

## 2022-07-06 NOTE — Telephone Encounter (Signed)
Pt is requesting refill on: SYNTHROID 25 MCG tablet   Pharmacy: [facility] - [place], [place] - [address]

## 2022-07-06 NOTE — Addendum Note (Signed)
Addended by: Tonny Bollman on: 07/06/2022 10:36 AM   Modules accepted: Orders

## 2022-07-15 DIAGNOSIS — C44712 Basal cell carcinoma of skin of right lower limb, including hip: Secondary | ICD-10-CM | POA: Diagnosis not present

## 2022-07-15 DIAGNOSIS — Z4801 Encounter for change or removal of surgical wound dressing: Secondary | ICD-10-CM | POA: Diagnosis not present

## 2022-07-15 DIAGNOSIS — I872 Venous insufficiency (chronic) (peripheral): Secondary | ICD-10-CM | POA: Diagnosis not present

## 2022-07-15 DIAGNOSIS — Z48817 Encounter for surgical aftercare following surgery on the skin and subcutaneous tissue: Secondary | ICD-10-CM | POA: Diagnosis not present

## 2022-08-18 ENCOUNTER — Other Ambulatory Visit: Payer: Self-pay | Admitting: Cardiology

## 2022-08-18 DIAGNOSIS — M7061 Trochanteric bursitis, right hip: Secondary | ICD-10-CM | POA: Diagnosis not present

## 2022-08-18 DIAGNOSIS — M79672 Pain in left foot: Secondary | ICD-10-CM | POA: Diagnosis not present

## 2022-09-17 DIAGNOSIS — M7061 Trochanteric bursitis, right hip: Secondary | ICD-10-CM | POA: Diagnosis not present

## 2022-09-20 ENCOUNTER — Other Ambulatory Visit: Payer: Self-pay | Admitting: Hematology and Oncology

## 2022-09-20 DIAGNOSIS — Z1231 Encounter for screening mammogram for malignant neoplasm of breast: Secondary | ICD-10-CM

## 2022-10-11 ENCOUNTER — Other Ambulatory Visit: Payer: Self-pay | Admitting: Cardiology

## 2022-10-18 ENCOUNTER — Encounter: Payer: Self-pay | Admitting: Cardiology

## 2022-10-18 ENCOUNTER — Ambulatory Visit: Payer: Medicare HMO | Attending: Cardiology | Admitting: Cardiology

## 2022-10-18 VITALS — BP 124/62 | HR 67 | Ht 63.0 in | Wt 133.0 lb

## 2022-10-18 DIAGNOSIS — I2089 Other forms of angina pectoris: Secondary | ICD-10-CM

## 2022-10-18 DIAGNOSIS — I10 Essential (primary) hypertension: Secondary | ICD-10-CM | POA: Diagnosis not present

## 2022-10-18 MED ORDER — LOSARTAN POTASSIUM 25 MG PO TABS
25.0000 mg | ORAL_TABLET | Freq: Every day | ORAL | 3 refills | Status: DC
Start: 2022-10-18 — End: 2023-11-10

## 2022-10-18 MED ORDER — CARVEDILOL 12.5 MG PO TABS: 12.5000 mg | ORAL_TABLET | Freq: Two times a day (BID) | ORAL | 3 refills | Status: DC

## 2022-10-18 NOTE — Progress Notes (Signed)
Cardiology Office Note:   Date:  10/18/2022  ID:  Kimberly Merritt, DOB [birth year], MRN [id number] PCP: Sandre Kitty, MD  [facility] Providers Cardiologist:  Rollene Rotunda, MD {  History of Present Illness:   Kimberly Merritt is a 82 y.o. female  who presents  for follow up of cardiomyopathy. she has been noted to have an ejection fraction of 30%. This was brought to attention after she was found to have frequent premature ventricular contractions. Cardiac catheterization demonstrated nonobstructive and small vessel disease.   EF was 55% on echo with moderate TR in April 2018.  At a previous visit she had chest pain.  However, she had a negative Lexiscan Myoview.   Her EF was well preserved on echo.  This was 2021.     Since I last saw her she has had some chest discomfort.  It is very difficult to tease out the chronicity of this but it sounds like it is sporadic.  The last episode was a week and a half ago.  It is 2 out of 10 in intensity.  It might last for couple of minutes.  She is not really sure that is the same as any discomfort she has had in the past.  She did have a stress test in 2021 as above but this discomfort might be new.  She is able to work in her yard without bringing it on.  She does do some gardening.  She is not having any associated symptoms such as nausea vomiting or diaphoresis.  She has had no new palpitations, presyncope or syncope.  ROS: As stated in the HPI and negative for all other systems.  Studies Reviewed:    EKG:   EKG Interpretation Date/Time:  Monday October 18 2022 11:06:32 EDT Ventricular Rate:  67 PR Interval:  138 QRS Duration:  82 QT Interval:  420 QTC Calculation: 443 R Axis:   60  Text Interpretation: Normal sinus rhythm Nonspecific ST and T wave abnormality When compared with ECG of 04-Oct-2013 07:43, Premature ventricular complexes are no longer Present Nonspecific T wave abnormality no longer evident in Lateral leads Confirmed  by Rollene Rotunda ([id number]) on 10/18/2022 11:23:01 AM   Risk Assessment/Calculations:              Physical Exam:   VS:  BP 124/62 (BP Location: Left Arm, Patient Position: Sitting, Cuff Size: Normal)   Pulse 67   Ht 5\' 3"  (1.6 m)   Wt 133 lb (60.3 kg)   BMI 23.56 kg/m    Wt Readings from Last 3 Encounters:  10/18/22 133 lb (60.3 kg)  06/24/22 132 lb 12 oz (60.2 kg)  06/22/22 132 lb 2 oz (59.9 kg)     GEN: Well nourished, well developed in no acute distress NECK: No JVD; No carotid bruits CARDIAC: RRR, no murmurs, rubs, gallops RESPIRATORY:  Clear to auscultation without rales, wheezing or rhonchi  ABDOMEN: Soft, non-tender, non-distended EXTREMITIES:  No edema; No deformity   ASSESSMENT AND PLAN:   CHEST PAIN:   Her discomfort is nonanginal greater than anginal.  At this point she is can let me know if she has any increasing frequency or intensity.  Otherwise has not suggesting any other testing.  She will continue with risk reduction.   HTN:       At target.  No change in therapy.   PVCs:     She has had these in the past but she is not describing any  news dysrhythmias.  No change in therapy.   TR:     This was mild in 2021.  I do not think this has changed clinically.  No change in therapy.       Follow up with APP in 6 months.   Signed, Rollene Rotunda, MD

## 2022-10-18 NOTE — Patient Instructions (Signed)
Medication Instructions:  NO CHANGES *If you need a refill on your cardiac medications before your next appointment, please call your pharmacy*    Follow-Up: At [facility], you and your health needs are our priority.  As part of our continuing mission to provide you with exceptional heart care, we have created designated Provider Care Teams.  These Care Teams include your primary Cardiologist (physician) and Advanced Practice Providers (APPs -  Physician Assistants and Nurse Practitioners) who all work together to provide you with the care you need, when you need it.  We recommend signing up for the patient portal called "MyChart".  Sign up information is provided on this After Visit Summary.  MyChart is used to connect with patients for Virtual Visits (Telemedicine).  Patients are able to view lab/test results, encounter notes, upcoming appointments, etc.  Non-urgent messages can be sent to your provider as well.   To learn more about what you can do with MyChart, go to [email or web address].    Your next appointment:   6 months with PA or NP

## 2022-10-27 ENCOUNTER — Ambulatory Visit
Admission: RE | Admit: 2022-10-27 | Discharge: 2022-10-27 | Disposition: A | Discharge status: Home / Self Care | Payer: Medicare HMO | Source: Ambulatory Visit | Attending: Hematology and Oncology | Admitting: Hematology and Oncology

## 2022-10-27 DIAGNOSIS — Z1231 Encounter for screening mammogram for malignant neoplasm of breast: Secondary | ICD-10-CM

## 2022-10-28 ENCOUNTER — Ambulatory Visit: Payer: Medicare HMO

## 2022-10-28 VITALS — Ht 63.0 in | Wt 133.0 lb

## 2022-10-28 DIAGNOSIS — Z0000 Encounter for general adult medical examination without abnormal findings: Secondary | ICD-10-CM | POA: Diagnosis not present

## 2022-10-28 NOTE — Patient Instructions (Addendum)
Kimberly Merritt , Thank you for taking time to come for your Medicare Wellness Visit. I appreciate your ongoing commitment to your health goals. Please review the following plan we discussed and let me know if I can assist you in the future.   Referrals/Orders/Follow-Ups/Clinician Recommendations:   This is a list of the screening recommended for you and due dates:  Health Maintenance  Topic Date Due   Flu Shot  09/02/2022   Medicare Annual Wellness Visit  10/28/2023   DTaP/Tdap/Td vaccine (3 - Td or Tdap) 04/13/2027   Pneumonia Vaccine  Completed   DEXA scan (bone density measurement)  Completed   HPV Vaccine  Aged Out   COVID-19 Vaccine  Discontinued   Zoster (Shingles) Vaccine  Discontinued    Advanced directives: (In Chart) A copy of your advanced directives are scanned into your chart should your provider ever need it.  Next Medicare Annual Wellness Visit scheduled for next year: Yes

## 2022-10-28 NOTE — Progress Notes (Signed)
Subjective:   Kimberly Merritt is a 82 y.o. female who presents for Medicare Annual (Subsequent) preventive examination.  Visit Complete: Virtual  I connected with  Kimberly Merritt on 10/28/22 by a audio enabled telemedicine application and verified that I am speaking with the correct person using two identifiers.  Patient Location: Home  Provider Location: Home Office  I discussed the limitations of evaluation and management by telemedicine. The patient expressed understanding and agreed to proceed.   Because this visit was a virtual/telehealth visit, some criteria may be missing or patient reported. Any vitals not documented were not able to be obtained and vitals that have been documented are patient reported.   Cardiac Risk Factors include: advanced age (>71men, >30 women);hypertension     Objective:    Today's Vitals   10/28/22 1559  Weight: 133 lb (60.3 kg)  Height: 5\' 3"  (1.6 m)   Body mass index is 23.56 kg/m.     10/28/2022    4:06 PM 06/24/2022    4:17 PM 09/08/2021   10:30 AM 12/04/2020    1:01 PM 07/22/2020   10:30 AM 12/16/2016    1:59 PM 11/13/2015   11:12 AM  Advanced Directives  Does Patient Have a Medical Advance Directive? Yes No Yes Yes No No No  Type of Estate agent of [place];Living will  Healthcare Power of [place];Living will Living will     Does patient want to make changes to medical advance directive? No - Patient declined  No - Patient declined      Copy of Healthcare Power of Attorney in Chart? Yes - validated most recent copy scanned in chart (See row information)  Yes - validated most recent copy scanned in chart (See row information)      Would patient like information on creating a medical advance directive?     No - Patient declined      Current Medications (verified) Outpatient Encounter Medications as of 10/28/2022  Medication Sig   carvedilol (COREG) 12.5 MG tablet Take 1 tablet (12.5 mg total) by mouth 2 (two)  times daily.   cetirizine (ZYRTEC) 10 MG tablet Take 10 mg by mouth daily.   Cholecalciferol (VITAMIN D3) 50 MCG (2000 UT) capsule Take 1 capsule by mouth daily.   cimetidine (TAGAMET) 200 MG tablet Take 200 mg by mouth daily.   cromolyn (NASALCROM) 5.2 MG/ACT nasal spray Place 1 spray into both nostrils 4 (four) times daily.   fluticasone (FLONASE) 50 MCG/ACT nasal spray Place 2 sprays into both nostrils daily.   Glucosamine-Chondroitin 1500-1200 MG/30ML LIQD Take 1 tablet by mouth daily.   losartan (COZAAR) 25 MG tablet Take 1 tablet (25 mg total) by mouth daily.   pantoprazole (PROTONIX) 40 MG tablet Take 40 mg by mouth daily.   sucralfate (CARAFATE) 1 g tablet Take 1 g by mouth daily as needed.    SYNTHROID 25 MCG tablet Take 1 tablet (25 mcg total) by mouth daily before breakfast.   tiZANidine (ZANAFLEX) 2 MG tablet Take 2 mg by mouth at bedtime as needed.   No facility-administered encounter medications on file as of 10/28/2022.    Allergies (verified) Acetaminophen, Privigen [immune globulin (human)], Alendronate sodium, Codeine, Diflunisal, Doxycycline, Flexeril [cyclobenzaprine], Iodinated contrast media, Lisinopril, Nabumetone, Other, Oxybutynin, Raloxifene, Sulfa antibiotics, Symbicort [budesonide-formoterol fumarate], Tramadol hcl, Tramadol hcl, Diclofenac, and Latex   History: Past Medical History:  Diagnosis Date   Allergy    CAD (coronary artery disease)    Nonobstructive. LAD 30%  stenosis, small PDA of the circumflex 70% stenosis, right coronary artery 40% stenosis. 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   LOW BACK PAIN 08/29/2006   MONOCLONAL GAMMOPATHY 03/03/2010   MUSCLE SPASM 03/03/2010   Nonischemic  cardiomyopathy (HCC)    EF 30%   OSTEOARTHRITIS 08/29/2006   OSTEOPENIA 11/19/2008   PARESTHESIA 03/05/2009   PVC's (premature ventricular contractions)    Scoliosis    VARICOSE VEINS, LOWER EXTREMITIES 08/29/2006   Past Surgical History:  Procedure Laterality Date   ABDOMINAL HYSTERECTOMY  1981   APPENDECTOMY     BACK SURGERY  2010,2006   lumb fusion   BUNIONECTOMY     both   BUNIONECTOMY Right 1985   CARPAL TUNNEL RELEASE     both   CARPAL TUNNEL RELEASE Right 1992   CARPAL TUNNEL RELEASE Left 1992   CATARACT EXTRACTION     both   CATARACT EXTRACTION Right 2008   CATARACT EXTRACTION Left 2008   HAMMER TOE SURGERY Left 1987   HAMMER TOE SURGERY Right 1986   LEFT AND RIGHT HEART CATHETERIZATION WITH CORONARY ANGIOGRAM N/A 10/04/2013   Procedure: LEFT AND RIGHT HEART CATHETERIZATION WITH CORONARY ANGIOGRAM;  Surgeon: Marykay Lex, MD;  Location: [facility] CATH LAB;  Service: Cardiovascular;  Laterality: N/A;   LUMBAR FUSION  2006   LUMBAR FUSION  2010   MOHS SURGERY     right cheek   OOPHORECTOMY  1967   OVARIAN CYST REMOVAL     ROTATOR CUFF REPAIR Right 2013   SHOULDER ARTHROSCOPY  02/09/2011   Procedure: ARTHROSCOPY SHOULDER;  Surgeon: Velna Ochs, MD;  Location: [facility];  Service: Orthopedics;  Laterality: Right;  right shoulder arthroscopy subacromial decompression with rotator cuff repair   TONSILLECTOMY     Family History  Problem Relation Age of Onset   Stroke Mother    Cancer Father    Emphysema Father    Hypertension Other    Breast cancer Neg Hx    Social History   Socioeconomic History   Marital status: Widowed    Spouse name: Not on file   Number of children: 0   Years of education: Not on file   Highest education level: Not on file  Occupational History   Occupation: Retired  Tobacco Use   Smoking status: Never   Smokeless tobacco: Never  Vaping Use   Vaping status: Never Used  Substance and Sexual Activity   Alcohol use:  No    Alcohol/week: 0.0 standard drinks of alcohol   Drug use: No   Sexual activity: Not Currently  Other Topics Concern   Not on file  Social History Narrative   Lives alone.     Social Determinants of Health   Financial Resource Strain: Low Risk  (10/28/2022)   Overall Financial Resource Strain (CARDIA)    Difficulty of Paying Living Expenses: Not hard at all  Food Insecurity: No Food Insecurity (10/28/2022)   Hunger Vital Sign    Worried About Running Out of Food in the Last Year: Never true    Ran Out of Food in the Last Year: Never true  Transportation Needs: No Transportation Needs (10/28/2022)   PRAPARE - Transportation  Lack of Transportation (Medical): No    Lack of Transportation (Non-Medical): No  Physical Activity: Sufficiently Active (10/28/2022)   Exercise Vital Sign    Days of Exercise per Week: 7 days    Minutes of Exercise per Session: 30 min  Stress: No Stress Concern Present (10/28/2022)   [employer] of Occupational Health - Occupational Stress Questionnaire    Feeling of Stress : Not at all  Social Connections: Moderately Integrated (10/28/2022)   Social Connection and Isolation Panel [NHANES]    Frequency of Communication with Friends and Family: More than three times a week    Frequency of Social Gatherings with Friends and Family: More than three times a week    Attends Religious Services: More than 4 times per year    Active Member of [employer] or Organizations: No    Attends Engineer, structural: Never    Marital Status: Married    Tobacco Counseling Counseling given: Not Answered   Clinical Intake:  Pre-visit preparation completed: Yes  Pain : No/denies pain     BMI - recorded: 23.56 Nutritional Status: BMI of 19-24  Normal Nutritional Risks: None Diabetes: No  How often do you need to have someone help you when you read instructions, pamphlets, or other written materials from your doctor or pharmacy?: 1 - Never  Interpreter  Needed?: No  Information entered by :: Theresa Mulligan LPN   Activities of Daily Living    10/28/2022    4:05 PM 04/20/2022   10:15 AM  In your present state of health, do you have any difficulty performing the following activities:  Hearing? 0 0  Vision? 0   Difficulty concentrating or making decisions? 0 0  Walking or climbing stairs? 0 0  Dressing or bathing? 0 0  Doing errands, shopping? 0 0  Preparing Food and eating ? N   Using the Toilet? N   In the past six months, have you accidently leaked urine? N   Do you have problems with loss of bowel control? N   Managing your Medications? N   Managing your Finances? N   Housekeeping or managing your Housekeeping? N     Patient Care Team: Sandre Kitty, MD as PCP - General (Family Medicine) Rollene Rotunda, MD as PCP - Cardiology (Cardiology) Marcene Corning, MD as Attending Physician (Orthopedic Surgery) Vida Rigger, MD (Gastroenterology) Jethro Bolus, MD as Attending Physician (Ophthalmology) Shirlean Kelly, MD as Consulting Physician (Neurosurgery) Rollene Rotunda, MD as Consulting Physician (Cardiology) Elesa Hacker, MD as Referring Physician (Dermatology) Bjorn Pippin, MD as Attending Physician (Urology) Sidney Ace, MD as Referring Physician (Allergy) Sherren Kerns, MD (Inactive) as Consulting Physician (Vascular Surgery) Edmon Crape, MD as Consulting Physician (Ophthalmology) Rachel Moulds, MD as Consulting Physician (Hematology and Oncology)  Indicate any recent Medical Services you may have received from other than [facility] providers in the past year (date may be approximate).     Assessment:   This is a routine wellness examination for Wine[place].  Hearing/Vision screen Hearing Screening - Comments:: Denies hearing difficulties   Vision Screening - Comments::  - up to date with routine eye exams with  Dr Dione Booze   Goals Addressed               This Visit's Progress     Increase physical  activity (pt-stated)         Depression Screen    10/28/2022    4:05 PM 06/24/2022    4:18 PM 04/20/2022  10:17 AM 02/19/2022    1:21 PM 01/05/2022   11:20 AM 09/08/2021   10:22 AM 09/04/2021    2:56 PM  PHQ 2/9 Scores  PHQ - 2 Score 0 1 0 0 0 0 0  PHQ- 9 Score  1    0     Fall Risk    10/28/2022    4:06 PM 06/24/2022    4:18 PM 04/20/2022   10:15 AM 02/19/2022    1:21 PM 01/05/2022   11:20 AM  Fall Risk   Falls in the past year? 0 0 0 0 0  Number falls in past yr: 0 0 0 0 0  Injury with Fall? 0 0  0 0  Risk for fall due to : No Fall Risks No Fall Risks No Fall Risks No Fall Risks No Fall Risks  Follow up Falls prevention discussed Falls evaluation completed Falls evaluation completed Falls evaluation completed Falls evaluation completed    MEDICARE RISK AT HOME: Medicare Risk at Home Any stairs in or around the home?: Yes If so, are there any without handrails?: No Home free of loose throw rugs in walkways, pet beds, electrical cords, etc?: Yes Adequate lighting in your home to reduce risk of falls?: Yes Life alert?: No Use of a cane, walker or w/c?: No Grab bars in the bathroom?: No Shower chair or bench in shower?: Yes Elevated toilet seat or a handicapped toilet?: No  TIMED UP AND GO:  Was the test performed?  No    Cognitive Function:    07/22/2020   10:31 AM  MMSE - Mini Mental State Exam  Orientation to time 5  Orientation to Place 5  Registration 3  Attention/ Calculation 0  Recall 3  Language- name 2 objects 2  Language- repeat 1  Language- follow 3 step command 3  Language- read & follow direction 1  Write a sentence 1  Copy design 0  Total score 24        10/28/2022    4:06 PM 09/08/2021   10:42 AM 05/30/2018    1:32 PM  6CIT Screen  What Year? 0 points 0 points 0 points  What month? 0 points 0 points 0 points  What time? 0 points 0 points 0 points  Count back from 20 0 points 0 points 0 points  Months in reverse 0 points 0 points 0 points   Repeat phrase 0 points 0 points 4 points  Total Score 0 points 0 points 4 points    Immunizations Immunization History  Administered Date(s) Administered   H1N1 02/13/2008   Influenza Split 11/23/2011, 10/30/2013, 10/09/2014   Influenza Whole 02/02/2005, 11/10/2007, 11/27/2008, 12/01/2009   Influenza, High Dose Seasonal PF 10/09/2014, 11/16/2017, 11/09/2018, 11/07/2020, 10/23/2021   Influenza-Unspecified 11/01/2012, 10/30/2013, 11/09/2018, 10/18/2019   PFIZER(Purple Top)SARS-COV-2 Vaccination 03/09/2019, 04/03/2019, 10/30/2019   Pneumococcal Conjugate-13 04/18/2013   Pneumococcal Polysaccharide-23 12/02/2005   Respiratory Syncytial Virus Vaccine,Recomb Aduvanted(Arexvy) 12/28/2021   Td 12/08/2004   Tdap 04/12/2017    TDAP status: Up to date  Flu Vaccine status: Up to date  Pneumococcal vaccine status: Up to date     Screening Tests Health Maintenance  Topic Date Due   INFLUENZA VACCINE  09/02/2022   Medicare Annual Wellness (AWV)  10/28/2023   DTaP/Tdap/Td (3 - Td or Tdap) 04/13/2027   Pneumonia Vaccine 48+ Years old  Completed   DEXA SCAN  Completed   HPV VACCINES  Aged Out   COVID-19 Vaccine  Discontinued   Zoster Vaccines-  Shingrix  Discontinued    Health Maintenance  Health Maintenance Due  Topic Date Due   INFLUENZA VACCINE  09/02/2022        Bone Density status: Completed 11/14/19. Results reflect: Bone density results: OSTEOPENIA. Repeat every   years.    Additional Screening:   Vision Screening: Recommended annual ophthalmology exams for early detection of glaucoma and other disorders of the eye. Is the patient up to date with their annual eye exam?  Yes  Who is the provider or what is the name of the office in which the patient attends annual eye exams? Dr Dione Booze If pt is not established with a provider, would they like to be referred to a provider to establish care? No .   Dental Screening: Recommended annual dental exams for proper oral  hygiene   Community Resource Referral / Chronic Care Management:  CRR required this visit?  No   CCM required this visit?  No     Plan:     I have personally reviewed and noted the following in the patient's chart:   Medical and social history Use of alcohol, tobacco or illicit drugs  Current medications and supplements including opioid prescriptions. Patient is not currently taking opioid prescriptions. Functional ability and status Nutritional status Physical activity Advanced directives List of other physicians Hospitalizations, surgeries, and ER visits in previous 12 months Vitals Screenings to include cognitive, depression, and falls Referrals and appointments  In addition, I have reviewed and discussed with patient certain preventive protocols, quality metrics, and best practice recommendations. A written personalized care plan for preventive services as well as general preventive health recommendations were provided to patient.     Tillie Rung, LPN   [id number]   After Visit Summary: (MyChart) Due to this being a telephonic visit, the after visit summary with patients personalized plan was offered to patient via MyChart   Nurse Notes: None

## 2022-10-29 ENCOUNTER — Telehealth: Payer: Self-pay | Admitting: Cardiology

## 2022-10-29 NOTE — Telephone Encounter (Signed)
Pt c/o of Chest Pain: STAT if CP now or developed within 24 hours  1. Are you having CP right now?  Patient states she feels constant discomfort but no pain currently  2. Are you experiencing any other symptoms (ex. SOB, nausea, vomiting, sweating)?  SOB  3. How long have you been experiencing CP?  Has been ongoing since last appointment with Dr. Antoine Poche, 9/16, but it has become more frequent   4. Is your CP continuous or coming and going?  Coming and going   5. Have you taken Nitroglycerin?  No, patient states she does not have any nitroglycerin  ?

## 2022-10-29 NOTE — Telephone Encounter (Signed)
Pt reports some chest pain- feels like the chest pain has become more constant than it was when she was here for her appt 10/18/22 with Dr Antoine Poche. The pain does not increase with activity.  The pain is not more intense, it is just more constant.  SOB - all the time now, not just with activity- she used inhaler to see if that would help and she is not as short of breath.  (Pt does not sound short of breath over the phone and she has been walking around) No nausea/vomiting/sweating. No pain in head, arm, or jaw. No slurred speech, dizziness, or facial droop (per patient) She does report some pain in her leg from an area that was "fixed" but then "re-obstructed" feels this needs to be addressed at next appt.   BP this morning: 136/65 hr 70 170/91 hr 67-   She just felt that she needed an appointment sooner, to see if the provider wanted to do any further testing and to also talk about the blood flow in her leg.  Appointment made for 11/01/22 at 1:30 with Stan Head, NP.   Instructed patient to sit down and rest for a few minutes, then retake her BP and I will call her back in about 15 minutes to see what her BP is.    Her repeat BP was 158/78 hr 68; pt does report that she used the albuterol inhaler earlier- does make her BP go up and she reports eating Eggplant Parm today which she says is full of salt. Told her to keep a log of her BP.  Given ER precautions, she verbalized understanding.

## 2022-10-30 NOTE — Progress Notes (Unsigned)
Cardiology Clinic Note   Date: 11/01/2022 ID: Kimberly, Merritt 09-23-1940, MRN [id number]  Primary Cardiologist:  Rollene Rotunda, MD  Patient Profile    Kimberly Merritt is a 82 y.o. female who presents to the clinic today for evaluation of chest pain.     Past medical history significant for: Nonobstructive CAD. R/LHC 10/04/2013 (new cardiomyopathy/atypical chest pain): Moderate disease LPDA (not amenable to PCI), minimal disease throughout.  Moderate to severely reduced LV function with EF approximately 30 to 35%, cardiac output roughly 3.7 x 3.9 by Fick and thermodilution.  Right heart pressures suggestive of possible dehydration. Nuclear stress test 07/20/2019: Normal perfusion with no ischemia or infarct. Chronic systolic heart failure in remission/nonischemic cardiomyopathy.  Echo 08/14/2019: EF 60 to 65%.  No RWMA.  Grade I DD.  Normal global strain.  Normal RV function.  Normal PA pressure.  Mild MR. Chronic venous insufficiency/varicose veins. S/p laser ablation of the left great saphenous vein 2008. Hypertension. Hyperlipidemia. GERD. Hypothyroidism.     History of Present Illness    Kimberly Merritt is followed by Dr. Antoine Poche for the above outlined history.  In summary, she was first evaluated by Dr. Antoine Poche on 09/25/2013 during ED visit for bradycardia.  She was sent to the ED from the cancer center secondary to heart rate of 33.  In the ED EKGs and rhythm strips showed atrial ectopy, no ventricle ectopy, proBNP mildly elevated at 1006, troponin negative.  Echo showed EF 30 to 35% with diffuse hypokinesis, Grade I DD, mild BAE.  She underwent R/LHC for further evaluation which showed nonobstructive CAD and right heart pressures suggestive of possible dehydration.  Repeat echo April 2018 showed recovered LV function with EF 55 to 60%, Grade I DD.  She had a negative nuclear stress test June 2021.  Patient was last seen in the office by Dr. Antoine Poche on 10/18/2022 for  routine follow-up.  At that time she reported some chest discomfort described as low intensity lasting for couple of minutes.  She reported ability to work in her yard without inducing symptoms.  It was felt her pain was not anginal and no further testing was planned.  Patient contacted the office on 10/29/2022 with complaints of increased episodes of chest pain.  Per triage RN: "Pt reports some chest pain- feels like the chest pain has become more constant than it was when she was here for her appt 10/18/22 with Dr Antoine Poche. The pain does not increase with activity.  The pain is not more intense, it is just more constant.  SOB - all the time now, not just with activity- she used inhaler to see if that would help and she is not as short of breath.  (Pt does not sound short of breath over the phone and she has been walking around) No nausea/vomiting/sweating. No pain in head, arm, or jaw. No slurred speech, dizziness, or facial droop (per patient) She does report some pain in her leg from an area that was "fixed" but then "re-obstructed" feels this needs to be addressed at next appt."  Today, patient is here alone. She reports a 1 month history of left sided chest pressure that occurs with or without exertion. She also has occasional, very brief sharp left sided chest pain that occurs with exertion and is accompanied by dyspnea. She reports developing cold symptoms about 3 weeks ago with a lot of coughing. This resolved until last night when cough recurred. She has had occasional higher BP readings  in the afternoons with SBP in the 140s. She is not sure if it correlates with higher sodium meals. She also wonders if she should have her legs checked again. She had an ablation of of the left great saphenous vein in 2008 and was told "it blew out." She does have occasional leg pain. She just recently started wearing compression thigh highs and it feels better. Suggested she follow back up with [facility] who has been  following her for venous insufficiency.      ROS: All other systems reviewed and are otherwise negative except as noted in History of Present Illness.  Studies Reviewed    EKG Interpretation Date/Time:  Monday November 01 2022 13:39:44 EDT Ventricular Rate:  63 PR Interval:  150 QRS Duration:  80 QT Interval:  436 QTC Calculation: 446 R Axis:   75  Text Interpretation: Normal sinus rhythm Nonspecific ST and T wave abnormality Confirmed by Carlos Levering [phone]) on 11/01/2022 1:45:26 PM           Physical Exam    VS:  BP 120/70 (BP Location: Left Arm, Patient Position: Sitting, Cuff Size: Normal)   Ht 5\' 3"  (1.6 m)   Wt 132 lb (59.9 kg)   BMI 23.38 kg/m  , BMI Body mass index is 23.38 kg/m.  GEN: Well nourished, well developed, in no acute distress. Neck: No JVD or carotid bruits. Cardiac:  RRR. No murmurs. No rubs or gallops.   Respiratory:  Respirations regular and unlabored. Clear to auscultation without rales, wheezing or rhonchi. GI: Soft, nontender, nondistended. Extremities: Radials/DP/PT 2+ and equal bilaterally. No clubbing or cyanosis. No edema. Compression stockings in place.   Skin: Warm and dry, no rash. Neuro: Strength intact.  Assessment & Plan    Nonobstructive CAD/Stable angina/DOE.  Per angiography September 2015. Patient reports a 1 month history of left sided chest pressure with or without exertion and occasional exertional left sided sharp pain that is very brief and associated with dyspnea. She had a cold about 3 weeks ago and was coughing a lot. This is the same sensation she described at her previous visit a couple of weeks ago but it has now become more of a constant pressure. She is tender to palpation on the left side of her chest just above the left breast.  Continue carvedilol. Chest pain with both typical and atypical features. Given persistence of pain and change to a more constant type pressure and associated DOE will get an echo and  Myoview for further evaluation.  Chronic systolic heart failure in remission/nonischemic cardiomyopathy.  EF 30 to 35% in August 2015, recovered per repeat echo April 2018 with EF 55 to 60%.  Last echo July 2021 showed EF 60 to 65%, Grade I DD, mild MR.  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 This is worse than previously. She reports occasional lower extremity edema. She has a history of venous insufficency followed by [facility]. She has compression stockings in place. No edema bilaterally today. Euvolemic and well compensated on exam.  Continue losartan, carvedilol. Hypertension. BP today 120/70. Patient denies headaches, dizziness or vision changes. Continue carvedilol and losartan.  Disposition: Echo and Myoview. CBC and BMP today. Return in 2 months or sooner as needed.      Informed Consent   Shared Decision Making/Informed Consent The risks [chest pain, shortness of breath, cardiac arrhythmias, dizziness, blood pressure fluctuations, myocardial infarction, stroke/transient ischemic attack, nausea, vomiting, allergic reaction, radiation exposure, metallic taste sensation and  life-threatening complications (estimated to be 1 in 10,000)], benefits (risk stratification, diagnosing coronary artery disease, treatment guidance) and alternatives of a nuclear stress test were discussed in detail with Kimberly Merritt and she agrees to proceed.      Signed, Kimberly Merritt. Kaleel Schmieder, DNP, NP-C

## 2022-11-01 ENCOUNTER — Encounter: Payer: Self-pay | Admitting: Student

## 2022-11-01 ENCOUNTER — Ambulatory Visit: Payer: Medicare HMO | Attending: Student | Admitting: Student

## 2022-11-01 VITALS — BP 120/70 | Ht 63.0 in | Wt 132.0 lb

## 2022-11-01 DIAGNOSIS — I25118 Atherosclerotic heart disease of native coronary artery with other forms of angina pectoris: Secondary | ICD-10-CM | POA: Diagnosis not present

## 2022-11-01 DIAGNOSIS — I428 Other cardiomyopathies: Secondary | ICD-10-CM

## 2022-11-01 DIAGNOSIS — R072 Precordial pain: Secondary | ICD-10-CM

## 2022-11-01 DIAGNOSIS — R0609 Other forms of dyspnea: Secondary | ICD-10-CM

## 2022-11-01 DIAGNOSIS — I10 Essential (primary) hypertension: Secondary | ICD-10-CM | POA: Diagnosis not present

## 2022-11-01 NOTE — Patient Instructions (Signed)
Medication Instructions:  Your physician recommends that you continue on your current medications as directed. Please refer to the Current Medication list given to you today.    *If you need a refill on your cardiac medications before your next appointment, please call your pharmacy*   Lab Work: - CBC -BMET    If you have labs (blood work) drawn today and your tests are completely normal, you will receive your results only by: MyChart Message (if you have MyChart) OR A paper copy in the mail If you have any lab test that is abnormal or we need to change your treatment, we will call you to review the results.   Testing/Procedures: Echo will be scheduled at [address].  Your physician has requested that you have an echocardiogram. Echocardiography is a painless test that uses sound waves to create images of your heart. It provides your doctor with information about the size and shape of your heart and how well your heart's chambers and valves are working. This procedure takes approximately one hour. There are no restrictions for this procedure. Please do NOT wear cologne, perfume, aftershave, or lotions (deodorant is allowed). Please arrive 15 minutes prior to your appointment time.   Carlos Levering, NP has ordered a Lexiscan Myocardial Perfusion Imaging Study.  Please arrive 15 minutes prior to your appointment time for registration and insurance purposes.   The test will take approximately 3 to 4 hours to complete; you may bring reading material.  If someone comes with you to your appointment, they will need to remain in the main lobby due to limited space in the testing area. **If you are pregnant or breastfeeding, please notify the nuclear lab prior to your appointment**   How to prepare for your Myocardial Perfusion Test: Do not eat or drink 3 hours prior to your test, except you may have water. Do not consume products containing caffeine (regular or  decaffeinated) 12 hours prior to your test. (ex: coffee, chocolate, sodas, tea). Do wear comfortable clothes (no dresses or overalls) and walking shoes, tennis shoes preferred (No heels or open toe shoes are allowed). Do NOT wear cologne, perfume, aftershave, or lotions (deodorant is allowed). If you use an inhaler, use it the AM of your test and bring it with you.  If you use a nebulizer, use it the AM of your test.  If these instructions are not followed, your test will have to be rescheduled.     Follow-Up: At [facility], you and your health needs are our priority.  As part of our continuing mission to provide you with exceptional heart care, we have created designated Provider Care Teams.  These Care Teams include your primary Cardiologist (physician) and Advanced Practice Providers (APPs -  Physician Assistants and Nurse Practitioners) who all work together to provide you with the care you need, when you need it.  We recommend signing up for the patient portal called "MyChart".  Sign up information is provided on this After Visit Summary.  MyChart is used to connect with patients for Virtual Visits (Telemedicine).  Patients are able to view lab/test results, encounter notes, upcoming appointments, etc.  Non-urgent messages can be sent to your provider as well.   To learn more about what you can do with MyChart, go to [email or web address].    Your next appointment:   2 month(s)  The format for your next appointment:   In Person  Provider:   Theodore Demark, PA-C, Micah Flesher, PA-C,  Marjie Skiff, PA-C, Robet Leu, PA-C, [place], [place], PA-C, or [place], [place]    Other Instructions The patient is advised to rest and limit the amount of walking she does.

## 2022-11-02 LAB — CBC
Hematocrit: 36.6 % (ref 34.0–46.6)
Hemoglobin: 12 g/dL (ref 11.1–15.9)
MCH: 30.5 pg (ref 26.6–33.0)
MCHC: 32.8 g/dL (ref 31.5–35.7)
MCV: 93 fL (ref 79–97)
Platelets: 322 10*3/uL (ref 150–450)
RBC: 3.93 x10E6/uL (ref 3.77–5.28)
RDW: 11 % — ABNORMAL LOW (ref 11.7–15.4)
WBC: 9.8 10*3/uL (ref 3.4–10.8)

## 2022-11-02 LAB — BASIC METABOLIC PANEL
BUN/Creatinine Ratio: 28 (ref 12–28)
BUN: 21 mg/dL (ref 8–27)
CO2: 26 mmol/L (ref 20–29)
Calcium: 8.9 mg/dL (ref 8.7–10.3)
Chloride: 97 mmol/L (ref 96–106)
Creatinine, Ser: 0.74 mg/dL (ref 0.57–1.00)
Glucose: 103 mg/dL — ABNORMAL HIGH (ref 70–99)
Potassium: 4.9 mmol/L (ref 3.5–5.2)
Sodium: 136 mmol/L (ref 134–144)
eGFR: 81 mL/min/{1.73_m2} (ref >59)

## 2022-11-16 ENCOUNTER — Telehealth (HOSPITAL_COMMUNITY): Payer: Self-pay | Admitting: *Deleted

## 2022-11-16 NOTE — Telephone Encounter (Signed)
Left message on voicemail per DPR in reference to upcoming appointment scheduled on  11/24/22 with detailed instructions given per Myocardial Perfusion Study Information Sheet for the test. LM to arrive 15 minutes early, and that it is imperative to arrive on time for appointment to keep from having the test rescheduled. If you need to cancel or reschedule your appointment, please call the office within 24 hours of your appointment. Failure to do so may result in a cancellation of your appointment, and a $50 no show fee. Phone number given for call back for any questions. Ricky Ala

## 2022-11-24 ENCOUNTER — Ambulatory Visit (HOSPITAL_BASED_OUTPATIENT_CLINIC_OR_DEPARTMENT_OTHER): Payer: Medicare HMO

## 2022-11-24 ENCOUNTER — Ambulatory Visit (HOSPITAL_COMMUNITY): Payer: Medicare HMO | Attending: Student

## 2022-11-24 DIAGNOSIS — R0609 Other forms of dyspnea: Secondary | ICD-10-CM

## 2022-11-24 DIAGNOSIS — R072 Precordial pain: Secondary | ICD-10-CM

## 2022-11-24 LAB — MYOCARDIAL PERFUSION IMAGING
Base ST Depression (mm): 0 mm
Estimated workload: 1
Exercise duration (min): 1 min
Exercise duration (sec): 0 s
LV dias vol: 77 mL (ref 46–106)
LV sys vol: 33 mL
MPHR: 138 {beats}/min
Nuc Stress EF: 57 %
Peak HR: 80 {beats}/min
Percent HR: 57 %
Rest HR: 60 {beats}/min
Rest Nuclear Isotope Dose: 9.5 mCi
SDS: 0
SRS: 0
SSS: 0
ST Depression (mm): 0 mm
Stress Nuclear Isotope Dose: 29.3 mCi
TID: 0.97

## 2022-11-24 LAB — ECHOCARDIOGRAM COMPLETE
Area-P 1/2: 4.12 cm2
S' Lateral: 3 cm

## 2022-11-24 MED ORDER — TECHNETIUM TC 99M TETROFOSMIN IV KIT
29.3000 | PACK | Freq: Once | INTRAVENOUS | Status: AC | PRN
Start: 2022-11-24 — End: 2022-11-24
  Administered 2022-11-24: 29.3 via INTRAVENOUS

## 2022-11-24 MED ORDER — REGADENOSON 0.4 MG/5ML IV SOLN
0.4000 mg | Freq: Once | INTRAVENOUS | Status: AC
  Administered 2022-11-24: 0.4 mg via INTRAVENOUS

## 2022-11-24 MED ORDER — TECHNETIUM TC 99M TETROFOSMIN IV KIT
9.5000 | PACK | Freq: Once | INTRAVENOUS | Status: AC | PRN
Start: 2022-11-24 — End: 2022-11-24
  Administered 2022-11-24: 9.5 via INTRAVENOUS

## 2022-11-29 ENCOUNTER — Telehealth: Payer: Self-pay | Admitting: Student

## 2022-11-29 NOTE — Telephone Encounter (Signed)
Below message relayed to patient. Verbalized understanding and agree. She will follow up with PCP.       The echo findings should not cause chest pain. With her normal stress test and echo findings, I do not think her pain is coming from her heart. Pain may be musculoskeletal. She can follow up with PCP.  Thank you!  DW

## 2022-11-29 NOTE — Telephone Encounter (Signed)
Pt is calling to get test results  Please advise.

## 2022-11-29 NOTE — Telephone Encounter (Signed)
Spoke to patient and below message. Patient report she is still have chest pain and would like to know if the stiffness and back flow could be the cause. She also stated the pain is worse when she cough or takes a deep breath? Please advise.       Please let patient know echo showed normal pumping function. There is mild stiffness of the heart which is not unexpected as we age. Mild backflow of blood through mitral vale that is not concerning. Nuclear stress test was also normal. Keep follow up with Marjie Skiff, PA-C in December.   Thank you!   DW

## 2022-11-30 ENCOUNTER — Ambulatory Visit (INDEPENDENT_AMBULATORY_CARE_PROVIDER_SITE_OTHER): Payer: Medicare HMO | Admitting: Family Medicine

## 2022-11-30 ENCOUNTER — Encounter: Payer: Self-pay | Admitting: Family Medicine

## 2022-11-30 VITALS — BP 133/80 | HR 65 | Ht 63.0 in | Wt 133.1 lb

## 2022-11-30 DIAGNOSIS — J069 Acute upper respiratory infection, unspecified: Secondary | ICD-10-CM | POA: Diagnosis not present

## 2022-11-30 DIAGNOSIS — R0789 Other chest pain: Secondary | ICD-10-CM | POA: Diagnosis not present

## 2022-11-30 NOTE — Progress Notes (Signed)
Established Patient Office Visit  Subjective   Patient ID: Kimberly Merritt, female    DOB: 04/30/1940  Age: 82 y.o. MRN: [id number]  Chief Complaint  Patient presents with   Nasal Congestion    HPI Patient here to discuss her chest pain.  We discussed the workup done by her cardiologist so far.  Discussed negative echo and stress test.  Patient states the pain is on the left side at the top of the left breast.  Is more noticeable when she is active.  Has decreased in frequency over the past few weeks.  Now notices it intermittently.  Also notices it with deep inhalation or cough.  No nausea or diaphoresis.  Pain does not radiate.  After talking with the patient, she states the symptoms possibly began after she increased her activity working in her garden and raking leaves.  URI-patient a week ago developed URI symptoms after catching it from somebody at the bank.  Has not tested for COVID or flu.  Symptoms are mostly nasal congestion, rhinorrhea.  She has been using Claritin, nasal irrigation, and Coricidin   The ASCVD Risk score (Arnett DK, et al., 2019) failed to calculate for the following reasons:   The 2019 ASCVD risk score is only valid for ages 71 to 69  There are no preventive care reminders to display for this patient.    Objective:     BP 133/80   Pulse 65   Ht 5\' 3"  (1.6 m)   Wt 133 lb 1.9 oz (60.4 kg)   SpO2 95%   BMI 23.58 kg/m    Physical Exam General: Alert, oriented CV: Regular rate and rhythm Pulmonary: Lungs clear bilaterally MSK: Tenderness to palpation near the fourth rib on the left side anteriorly.  No deformity. Psych: Pleasant affect   No results found for any visits on 11/30/22.      Assessment & Plan:   Chest wall pain Assessment & Plan: Likely initiated by overactivity when working in her yard.  Pain is reproducible with palpation of the left anterior torso.  Recommended topical pain relief, topical heat, light stretching of the  pectoral muscle, light activity.   Viral upper respiratory tract infection Assessment & Plan: 1 week of nasal congestion and rhinorrhea.  Recommended continued symptomatic treatment with nasal irrigation, Coricidin, and claritin.  No evidence of bacterial lower respiratory infection or bacterial sinusitis.      Return in about 6 weeks (around 01/11/2023) for physical.    Sandre Kitty, MD

## 2022-11-30 NOTE — Assessment & Plan Note (Signed)
1 week of nasal congestion and rhinorrhea.  Recommended continued symptomatic treatment with nasal irrigation, Coricidin, and claritin.  No evidence of bacterial lower respiratory infection or bacterial sinusitis.

## 2022-11-30 NOTE — Patient Instructions (Addendum)
It was nice to see you today,  We addressed the following topics today: -Your chest pain is musculoskeletal.  Things you can do include putting topical pain relieving lotions on it, putting topical heat on it, stretching it by stretching arm backwards to stretch the pectoral muscle.  This will eventually get better on its own - For your upper respiratory infection please continue your current symptomatic treatments as needed.  Have a great day,  Frederic Jericho, MD

## 2022-11-30 NOTE — Assessment & Plan Note (Signed)
Likely initiated by overactivity when working in her yard.  Pain is reproducible with palpation of the left anterior torso.  Recommended topical pain relief, topical heat, light stretching of the pectoral muscle, light activity.

## 2022-12-01 ENCOUNTER — Telehealth: Payer: Self-pay | Admitting: Cardiology

## 2022-12-01 NOTE — Telephone Encounter (Signed)
Patient canceled her appointment on 12/5 stating she saw her PCP who determined her issue was muscular and not heart related. Patient wants to know when she should have her next cardiology follow-up visit.

## 2022-12-01 NOTE — Telephone Encounter (Signed)
Spoke with patient and she will follow up as instructed in March per Dr Hochrein's note for her to follow up in 6 months.

## 2022-12-06 DIAGNOSIS — D1801 Hemangioma of skin and subcutaneous tissue: Secondary | ICD-10-CM | POA: Diagnosis not present

## 2022-12-06 DIAGNOSIS — Z08 Encounter for follow-up examination after completed treatment for malignant neoplasm: Secondary | ICD-10-CM | POA: Diagnosis not present

## 2022-12-06 DIAGNOSIS — C44729 Squamous cell carcinoma of skin of left lower limb, including hip: Secondary | ICD-10-CM | POA: Diagnosis not present

## 2022-12-06 DIAGNOSIS — L814 Other melanin hyperpigmentation: Secondary | ICD-10-CM | POA: Diagnosis not present

## 2022-12-06 DIAGNOSIS — L821 Other seborrheic keratosis: Secondary | ICD-10-CM | POA: Diagnosis not present

## 2022-12-06 DIAGNOSIS — Z872 Personal history of diseases of the skin and subcutaneous tissue: Secondary | ICD-10-CM | POA: Diagnosis not present

## 2022-12-06 DIAGNOSIS — L728 Other follicular cysts of the skin and subcutaneous tissue: Secondary | ICD-10-CM | POA: Diagnosis not present

## 2022-12-06 DIAGNOSIS — R229 Localized swelling, mass and lump, unspecified: Secondary | ICD-10-CM | POA: Diagnosis not present

## 2022-12-06 DIAGNOSIS — Z85828 Personal history of other malignant neoplasm of skin: Secondary | ICD-10-CM | POA: Diagnosis not present

## 2022-12-06 DIAGNOSIS — Z09 Encounter for follow-up examination after completed treatment for conditions other than malignant neoplasm: Secondary | ICD-10-CM | POA: Diagnosis not present

## 2022-12-14 ENCOUNTER — Other Ambulatory Visit: Payer: Self-pay | Admitting: Gastroenterology

## 2022-12-14 DIAGNOSIS — R110 Nausea: Secondary | ICD-10-CM

## 2022-12-14 DIAGNOSIS — R1013 Epigastric pain: Secondary | ICD-10-CM

## 2022-12-14 DIAGNOSIS — K219 Gastro-esophageal reflux disease without esophagitis: Secondary | ICD-10-CM | POA: Diagnosis not present

## 2022-12-16 ENCOUNTER — Other Ambulatory Visit: Payer: Self-pay | Admitting: *Deleted

## 2022-12-20 ENCOUNTER — Ambulatory Visit
Admission: RE | Admit: 2022-12-20 | Discharge: 2022-12-20 | Disposition: A | Discharge status: Home / Self Care | Payer: Medicare HMO | Source: Ambulatory Visit | Attending: Gastroenterology | Admitting: Gastroenterology

## 2022-12-20 DIAGNOSIS — R110 Nausea: Secondary | ICD-10-CM

## 2022-12-20 DIAGNOSIS — R1013 Epigastric pain: Secondary | ICD-10-CM

## 2022-12-21 ENCOUNTER — Other Ambulatory Visit: Payer: Self-pay | Admitting: Hematology and Oncology

## 2022-12-21 DIAGNOSIS — D472 Monoclonal gammopathy: Secondary | ICD-10-CM

## 2022-12-22 ENCOUNTER — Other Ambulatory Visit: Payer: Self-pay

## 2022-12-22 ENCOUNTER — Inpatient Hospital Stay: Payer: Medicare HMO | Admitting: Hematology and Oncology

## 2022-12-22 ENCOUNTER — Inpatient Hospital Stay: Payer: Medicare HMO | Attending: Hematology and Oncology

## 2022-12-22 VITALS — BP 141/58 | HR 70 | Temp 98.2°F | Resp 16 | Wt 132.2 lb

## 2022-12-22 DIAGNOSIS — Z780 Asymptomatic menopausal state: Secondary | ICD-10-CM | POA: Insufficient documentation

## 2022-12-22 DIAGNOSIS — D472 Monoclonal gammopathy: Secondary | ICD-10-CM | POA: Diagnosis not present

## 2022-12-22 DIAGNOSIS — I428 Other cardiomyopathies: Secondary | ICD-10-CM | POA: Insufficient documentation

## 2022-12-22 DIAGNOSIS — M1990 Unspecified osteoarthritis, unspecified site: Secondary | ICD-10-CM | POA: Diagnosis not present

## 2022-12-22 DIAGNOSIS — D801 Nonfamilial hypogammaglobulinemia: Secondary | ICD-10-CM | POA: Diagnosis not present

## 2022-12-22 LAB — CMP (CANCER CENTER ONLY)
ALT: 15 U/L (ref 0–44)
AST: 19 U/L (ref 15–41)
Albumin: 3.8 g/dL (ref 3.5–5.0)
Alkaline Phosphatase: 77 U/L (ref 38–126)
Anion gap: 5 (ref 5–15)
BUN: 22 mg/dL (ref 8–23)
CO2: 31 mmol/L (ref 22–32)
Calcium: 8.9 mg/dL (ref 8.9–10.3)
Chloride: 99 mmol/L (ref 98–111)
Creatinine: 0.99 mg/dL (ref 0.44–1.00)
GFR, Estimated: 57 mL/min — ABNORMAL LOW (ref >60)
Glucose, Bld: 104 mg/dL — ABNORMAL HIGH (ref 70–99)
Potassium: 4.3 mmol/L (ref 3.5–5.1)
Sodium: 135 mmol/L (ref 135–145)
Total Bilirubin: 0.4 mg/dL (ref <1.2)
Total Protein: 7 g/dL (ref 6.5–8.1)

## 2022-12-22 LAB — CBC WITH DIFFERENTIAL (CANCER CENTER ONLY)
Abs Immature Granulocytes: 0.02 10*3/uL (ref 0.00–0.07)
Basophils Absolute: 0.1 10*3/uL (ref 0.0–0.1)
Basophils Relative: 1 %
Eosinophils Absolute: 0.2 10*3/uL (ref 0.0–0.5)
Eosinophils Relative: 2 %
HCT: 36 % (ref 36.0–46.0)
Hemoglobin: 12 g/dL (ref 12.0–15.0)
Immature Granulocytes: 0 %
Lymphocytes Relative: 41 %
Lymphs Abs: 3.4 10*3/uL (ref 0.7–4.0)
MCH: 31.7 pg (ref 26.0–34.0)
MCHC: 33.3 g/dL (ref 30.0–36.0)
MCV: 95 fL (ref 80.0–100.0)
Monocytes Absolute: 0.9 10*3/uL (ref 0.1–1.0)
Monocytes Relative: 11 %
Neutro Abs: 3.7 10*3/uL (ref 1.7–7.7)
Neutrophils Relative %: 45 %
Platelet Count: 322 10*3/uL (ref 150–400)
RBC: 3.79 MIL/uL — ABNORMAL LOW (ref 3.87–5.11)
RDW: 11.9 % (ref 11.5–15.5)
WBC Count: 8.2 10*3/uL (ref 4.0–10.5)
nRBC: 0 % (ref 0.0–0.2)

## 2022-12-22 NOTE — Progress Notes (Signed)
[facility]  [phone] [phone]     ID: Kimberly Merritt DOB: [birth year]  MR#: [id number]  JYN#:[id number]  Patient Care Team: Sandre Kitty, MD as PCP - General (Family Medicine) Rollene Rotunda, MD as PCP - Cardiology (Cardiology) Marcene Corning, MD as Attending Physician (Orthopedic Surgery) Vida Rigger, MD (Gastroenterology) Jethro Bolus, MD as Attending Physician (Ophthalmology) Shirlean Kelly, MD as Consulting Physician (Neurosurgery) Rollene Rotunda, MD as Consulting Physician (Cardiology) Elesa Hacker, MD as Referring Physician (Dermatology) Bjorn Pippin, MD as Attending Physician (Urology) Sidney Ace, MD as Referring Physician (Allergy) Sherren Kerns, MD (Inactive) as Consulting Physician (Vascular Surgery) Edmon Crape, MD as Consulting Physician (Ophthalmology) Rachel Moulds, MD as Consulting Physician (Hematology and Oncology) OTHER MD: Rollene Rotunda MD, Chilton Greathouse MD  CHIEF COMPLAINT: IgM M-GUS/ low-grade lymphoproliferative process  CURRENT TREATMENT: IVIG as needed; Evusheld  INTERVAL HISTORY  Kimberly Merritt returns today for 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  Since her last visit here, she has had a few health issues.  She apparently experienced some chest pain on the left side and went to see her cardiologist, they did a stress test and an echocardiogram and deemed that the pain is noncardiac.  She then followed up with her PCP who noticed a palpable tenderness in the left chest wall consistent with musculoskeletal pain and this has since resolved.  She also went to her GI doctor Dr. Charyl Dancer because she was experiencing more dyspepsia, nausea and she was prescribed Zofran for nausea as needed.  We also ordered an ultrasound of her abdomen, this has been completed but not read yet.  Other than this, she has got a couple cold in the past 2 months.  Her arthritis is acting up given the  colder weather and the change in temperature.  No fevers, drenching night sweats, unintentional loss of weight.  No persistent bone pains.  No change in breathing reported. Rest of the pertinent 10 point ROS reviewed and negative   COVID 19 VACCINATION STATUS: fully vaccinated [employer]), with booster 10/2019   HISTORY PRESENT ILLNESS::  from the earlier summary note:  "Kimberly Merritt" Blunt is a 82 y/o [place] woman I evaluated originally in October of 2004 for a monoclonal gammopathy. At that time she had a white cell count of 7.2, hemoglobin 12.8, MCV 94.5, and platelets 298,000. Protein workup showed an IgM kappa paraprotein of 0.6 g, with a total IgG slightly depressed at 556, but a normal IgA at 82. Because an IgM paraprotein can be associated with a low grade lymphoproliferative process (Waldenstrm's macroglobulinemia) [name] underwent bone marrow biopsy 11/27/2002, which showed ([id number]) a normal cellular marrow with trilineage hematopoiesis and 5% plasma cells. The peripheral blood film showed no significant abnormalities and on the biopsy para-spicular lymphoid aggregates were not noted. 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  The patient was followed with observation alone through May of 2009, with no evidence of progression in her IgM kappa clone. As of 06/03/2007 her total IgM spike was 0.83, her IgG was 490 and her IgA was 36. The patient was released from followup here at that time.  More recently, as the 10 year anniversary of her original bone marrow biopsy past, Dr. Jennette Banker of referred the patient back for reassessment and possible bone marrow rebiopsy. Kimberly Merritt was scheduled here for lab work 09/25/2013. However when she had her blood drawn she complained of some shortness of breath and her pulse was  checked. It was 33. The patient was referred to the emergency room where she was evaluated by Dr. Antoine Poche who felt the patient's true heart rate  was higher (she has ectopic beats which generated a week pulse and may have been missed) since in the emergency room EKG her rate was 98 with normal sinus rhythm. Troponins were negative. BNP was slightly elevated.  Dr. Antoine Poche set the patient up for an echocardiogram 09/27/2013 and this showed an ejection fraction of 30-35%. There was diffuse hypokinesis. She underwent right and left heart catheterization 10/04/2013 which showed no significant obstructions. The study did confirm an ejection fraction of roughly 35% with global hypokinesis. She is followed by Dr. Antoine Poche for her new diagnosis of non-ischemic cardiomyopathy.  Her subsequent history is as detailed below   PAST MEDICAL HISTORY: Past Medical History:  Diagnosis Date   Allergy    CAD (coronary artery disease)    Nonobstructive. LAD 30% stenosis, small PDA of the circumflex 70% stenosis, right coronary artery 40% stenosis. 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   LOW BACK PAIN 08/29/2006   MONOCLONAL GAMMOPATHY 03/03/2010   MUSCLE SPASM 03/03/2010   Nonischemic cardiomyopathy (HCC)    EF 30%   OSTEOARTHRITIS 08/29/2006   OSTEOPENIA 11/19/2008   PARESTHESIA 03/05/2009   PVC's (premature ventricular contractions)    Scoliosis    VARICOSE VEINS, LOWER EXTREMITIES 08/29/2006    PAST SURGICAL HISTORY: Past Surgical History:  Procedure Laterality Date   ABDOMINAL HYSTERECTOMY  1981   APPENDECTOMY     BACK SURGERY  2010,2006   lumb fusion   BUNIONECTOMY     both   BUNIONECTOMY Right 1985   CARPAL TUNNEL RELEASE     both   CARPAL TUNNEL RELEASE Right 1992   CARPAL TUNNEL RELEASE Left 1992   CATARACT EXTRACTION     both   CATARACT EXTRACTION Right 2008    CATARACT EXTRACTION Left 2008   HAMMER TOE SURGERY Left 1987   HAMMER TOE SURGERY Right 1986   LEFT AND RIGHT HEART CATHETERIZATION WITH CORONARY ANGIOGRAM N/A 10/04/2013   Procedure: LEFT AND RIGHT HEART CATHETERIZATION WITH CORONARY ANGIOGRAM;  Surgeon: Marykay Lex, MD;  Location: [facility];  Service: Cardiovascular;  Laterality: N/A;   LUMBAR FUSION  2006   LUMBAR FUSION  2010   MOHS SURGERY     right cheek   OOPHORECTOMY  1967   OVARIAN CYST REMOVAL     ROTATOR CUFF REPAIR Right 2013   SHOULDER ARTHROSCOPY  02/09/2011   Procedure: ARTHROSCOPY SHOULDER;  Surgeon: Velna Ochs, MD;  Location: [facility];  Service: Orthopedics;  Laterality: Right;  right shoulder arthroscopy subacromial decompression with rotator cuff repair   TONSILLECTOMY      FAMILY HISTORY Family History  Problem Relation Age of Onset   Stroke Mother    Cancer Father    Emphysema Father    Hypertension Other    Breast cancer Neg Hx    the patient's father died at the age of 52 from lung cancer in the setting of tobacco abuse. [name] mother died at the age of 49 following a stroke. The patient had one brother, who is severely retarded  and died from pneumonia at the age of 37. There were no sisters. There is no history of blood problems or cancer in the family to the patient's knowledge   GYNECOLOGIC HISTORY:  No LMP recorded. Patient is postmenopausal. Menarche age 58. The patient is GX P0. She stopped having periods in 28-Dec-1978 and took hormone replacement for almost 20 years.   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 Her husband died in 12/28/1991 from a myocardial infarction.  She tells me she has no family.  She lives by herself, [place], with one cat for company.    ADVANCED DIRECTIVES: Her healthcare power of attorney is Derald Macleod who can be reached at [phone] (cell), or [phone] (home).  Also if Kimberly Gower had an  acute problem she would want [place] to contact her neighbor and third cousin Lavell Luster at [phone]    HEALTH MAINTENANCE: Social History   Tobacco Use   Smoking status: Never   Smokeless tobacco: Never  Vaping Use   Vaping status: Never Used  Substance Use Topics   Alcohol use: No    Alcohol/week: 0.0 standard drinks of alcohol   Drug use: No     Colonoscopy: 12/27/12  PAP: December 28, 2011  Bone density: 11/2018, -2.4  Lipid panel:  Allergies  Allergen Reactions   Acetaminophen Rash    REACTION: high dose - tremor Per pt - rash on chest and heart palpitations   Privigen [Immune Globulin (Human)] Other (See Comments)    Pt had a rxn; see hypersensitivity note from 01/15/22 at 1259.    Alendronate Sodium     Stomach upset.    Codeine     May interfere with heart condition     Diflunisal     Stomach pain.     Doxycycline     n/v   Flexeril [Cyclobenzaprine]     Abdominal pain.     Iodinated Contrast Media Other (See Comments)    Syncope.    Lisinopril Other (See Comments)   Nabumetone     Abdominal pain; dark, tarry stool.     Other Other (See Comments)   Oxybutynin     Pt does not remember.     Raloxifene     Unknown.     Sulfa Antibiotics Nausea Only    Syncope.     Symbicort [Budesonide-Formoterol Fumarate]     "Jittery-ness."    Tramadol Hcl     Numbness.     Tramadol Hcl Itching    Numbness.     Diclofenac Palpitations    Dark, tarry stool.     Latex Rash    Current Outpatient Medications  Medication Sig Dispense Refill   carvedilol (COREG) 12.5 MG tablet Take 1 tablet (12.5 mg total) by mouth 2 (two) times daily. 180 tablet 3   Cholecalciferol (VITAMIN D3) 50 MCG (2000 UT) capsule Take 1 capsule by mouth daily.     cimetidine (TAGAMET) 200 MG tablet Take 200 mg by mouth daily.     cromolyn (NASALCROM) 5.2 MG/ACT nasal spray Place 1 spray into both nostrils 4 (four) times daily. 26 mL 5   Glucosamine-Chondroitin 1500-1200 MG/30ML LIQD Take 1 tablet by mouth  daily.     losartan (COZAAR) 25 MG tablet Take 1 tablet (25 mg total) by mouth daily. 90 tablet 3   pantoprazole (PROTONIX) 40 MG tablet Take 40 mg by mouth daily.     sucralfate (CARAFATE) 1 g tablet Take  1 g by mouth daily as needed.      SYNTHROID 25 MCG tablet Take 1 tablet (25 mcg total) by mouth daily before breakfast. 90 tablet 1   tiZANidine (ZANAFLEX) 2 MG tablet Take 2 mg by mouth at bedtime as needed.     No current facility-administered medications for this visit.    OBJECTIVE: white woman who appears stated age  Vitals:   12/22/22 1255  BP: (!) 141/58  Pulse: 70  Resp: 16  Temp: 98.2 F (36.8 C)  SpO2: 98%     Body mass index is 23.42 kg/m.    ECOG FS:2 - Symptomatic, <50% confined to bed  Physical Exam Constitutional:      Appearance: Normal appearance.  Cardiovascular:     Rate and Rhythm: Normal rate and regular rhythm.     Pulses: Normal pulses.     Heart sounds: Normal heart sounds.  Musculoskeletal:     Cervical back: Normal range of motion and neck supple. No rigidity.     Comments: Severe arthritis of hands  Lymphadenopathy:     Cervical: No cervical adenopathy.  Neurological:     Mental Status: She is alert.     LAB RESULTS:  CMP     Component Value Date/Time   NA 136 11/01/2022 1433   NA 136 11/04/2016 1115   K 4.9 11/01/2022 1433   K 4.8 11/04/2016 1115   CL 97 11/01/2022 1433   CO2 26 11/01/2022 1433   CO2 27 11/04/2016 1115   GLUCOSE 103 (H) 11/01/2022 1433   GLUCOSE 81 06/22/2022 1058   GLUCOSE 95 11/04/2016 1115   BUN 21 11/01/2022 1433   BUN 16.7 11/04/2016 1115   CREATININE 0.74 11/01/2022 1433   CREATININE 0.83 05/25/2021 1053   CREATININE 0.8 11/04/2016 1115   CALCIUM 8.9 11/01/2022 1433   CALCIUM 9.0 11/04/2016 1115   PROT 6.9 06/22/2022 1058   PROT 7.0 05/21/2020 1357   PROT 6.6 11/04/2016 1115   ALBUMIN 3.8 06/22/2022 1058   ALBUMIN 4.0 05/21/2020 1357   ALBUMIN 3.5 11/04/2016 1115   AST 15 06/22/2022 1058   AST  14 (L) 05/25/2021 1053   AST 23 11/04/2016 1115   ALT 13 06/22/2022 1058   ALT 12 05/25/2021 1053   ALT 18 11/04/2016 1115   ALKPHOS 69 06/22/2022 1058   ALKPHOS 82 11/04/2016 1115   BILITOT 0.4 06/22/2022 1058   BILITOT 0.4 05/25/2021 1053   BILITOT 0.40 11/04/2016 1115   GFRNONAA >60 06/22/2022 1058   GFRNONAA >60 05/25/2021 1053   GFRNONAA 69 10/02/2013 1234   GFRAA 74 01/23/2020 0822   GFRAA 80 10/02/2013 1234    I No results found for: "SPEP", "UPEP"  Lab Results  Component Value Date   WBC 8.2 12/22/2022   NEUTROABS 3.7 12/22/2022   HGB 12.0 12/22/2022   HCT 36.0 12/22/2022   MCV 95.0 12/22/2022   PLT 322 12/22/2022      Chemistry      Component Value Date/Time   NA 136 11/01/2022 1433   NA 136 11/04/2016 1115   K 4.9 11/01/2022 1433   K 4.8 11/04/2016 1115   CL 97 11/01/2022 1433   CO2 26 11/01/2022 1433   CO2 27 11/04/2016 1115   BUN 21 11/01/2022 1433   BUN 16.7 11/04/2016 1115   CREATININE 0.74 11/01/2022 1433   CREATININE 0.83 05/25/2021 1053   CREATININE 0.8 11/04/2016 1115   GLU 89 04/08/2016 0000      Component Value  Date/Time   CALCIUM 8.9 11/01/2022 1433   CALCIUM 9.0 11/04/2016 1115   ALKPHOS 69 06/22/2022 1058   ALKPHOS 82 11/04/2016 1115   AST 15 06/22/2022 1058   AST 14 (L) 05/25/2021 1053   AST 23 11/04/2016 1115   ALT 13 06/22/2022 1058   ALT 12 05/25/2021 1053   ALT 18 11/04/2016 1115   BILITOT 0.4 06/22/2022 1058   BILITOT 0.4 05/25/2021 1053   BILITOT 0.40 11/04/2016 1115       No results found for: "LABCA2"  No components found for: "LABCA125"  No results for input(s): "INR" in the last 168 hours.  Urinalysis    Component Value Date/Time   COLORURINE LT. YELLOW 06/08/2011 0830   APPEARANCEUR Clear 05/14/2020 1519   LABSPEC 1.010 06/08/2011 0830   PHURINE 6.5 06/08/2011 0830   GLUCOSEU Negative 05/14/2020 1519   GLUCOSEU NEGATIVE 06/08/2011 0830   HGBUR NEGATIVE 06/08/2011 0830   BILIRUBINUR Negative 05/14/2020  1519   KETONESUR NEGATIVE 06/08/2011 0830   PROTEINUR Negative 05/14/2020 1519   PROTEINUR NEGATIVE 04/19/2008 1216   UROBILINOGEN 0.2 10/23/2018 1047   UROBILINOGEN 0.2 06/08/2011 0830   NITRITE Negative 05/14/2020 1519   NITRITE NEGATIVE 06/08/2011 0830   LEUKOCYTESUR Negative 05/14/2020 1519   STUDIES: MYOCARDIAL PERFUSION IMAGING  Result Date: 11/24/2022   Resting ECG shows non specific ST/T wave abnormality   A pharmacological stress test was performed using IV Lexiscan 0.4mg  over 10 seconds performed without concurrent submaximal exercise. The patient reported headaches during the stress test. Normal blood pressure and normal heart rate response noted during stress. Heart rate recovery was normal.   No ST deviation was noted from baseline EKG.   Diaphragmatic attenuation artifact was present. Image quality affected due to significant extracardiac activity.   LV perfusion is abnormal. There is no evidence of ischemia. There is no evidence of infarction. Defect 1: There is a small defect with mild reduction in uptake present in the apical apex location(s) that is fixed. There is normal wall motion in the defect area. Consistent with artifact caused by bowel tracer uptake and diaphragmatic attenuation.   Left ventricular function is normal. Nuclear stress EF: 57%. The left ventricular ejection fraction is normal (55-65%). End diastolic cavity size is normal. End systolic cavity size is normal. No evidence of transient ischemic dilation (TID) noted.   Prior study available for comparison from 07/20/2019.   The study is normal. The study is low risk.   ECHOCARDIOGRAM COMPLETE  Result Date: 11/24/2022    ECHOCARDIOGRAM REPORT   Patient Name:   Kimberly Merritt Date of Exam: 11/24/2022 Medical Rec #:  [id number]         Height:       63.0 in Accession #:    [id number]        Weight:       132.0 lb Date of Birth:  1940-11-19         BSA:          1.621 m Patient Age:    82 years          BP:            150/79 mmHg Patient Gender: F                 HR:           60 bpm. Exam Location:  [address] Procedure: 2D Echo, 3D Echo, Cardiac Doppler, Color Doppler and Strain Analysis Indications:    R07.9  Chest Pain  History:        Patient has no prior history of Echocardiogram examinations.                 Nonischemic cardiomyopathy; Risk Factors:Hypertension.  Sonographer:    Clearence Ped RCS Referring Phys: [id number] Carlos Levering IMPRESSIONS  1. Left ventricular ejection fraction, by estimation, is 60 to 65%. The left ventricle has normal function. The left ventricle has no regional wall motion abnormalities. Left ventricular diastolic parameters are consistent with Grade I diastolic dysfunction (impaired relaxation).  2. Right ventricular systolic function is low normal. The right ventricular size is normal. There is normal pulmonary artery systolic pressure. The estimated right ventricular systolic pressure is 21.3 mmHg.  3. The mitral valve is abnormal. Mild mitral valve regurgitation.  4. The aortic valve is tricuspid. Aortic valve regurgitation is not visualized.  5. The inferior vena cava is normal in size with greater than 50% respiratory variability, suggesting right atrial pressure of 3 mmHg. Comparison(s): Changes from prior study are noted. 08/24/2019: LVEF 60-65%. FINDINGS  Left Ventricle: Left ventricular ejection fraction, by estimation, is 60 to 65%. The left ventricle has normal function. The left ventricle has no regional wall motion abnormalities. The left ventricular internal cavity size was normal in size. There is  no left ventricular hypertrophy. Left ventricular diastolic parameters are consistent with Grade I diastolic dysfunction (impaired relaxation). Indeterminate filling pressures. Right Ventricle: The right ventricular size is normal. No increase in right ventricular wall thickness. Right ventricular systolic function is low normal. There is normal pulmonary artery systolic pressure.  The tricuspid regurgitant velocity is 2.14 m/s,  and with an assumed right atrial pressure of 3 mmHg, the estimated right ventricular systolic pressure is 21.3 mmHg. Left Atrium: Left atrial size was normal in size. Right Atrium: Right atrial size was normal in size. Pericardium: There is no evidence of pericardial effusion. Mitral Valve: The mitral valve is abnormal. There is mild thickening of the anterior and posterior mitral valve leaflet(s). Mild mitral valve regurgitation. Tricuspid Valve: The tricuspid valve is grossly normal. Tricuspid valve regurgitation is trivial. Aortic Valve: The aortic valve is tricuspid. Aortic valve regurgitation is not visualized. Pulmonic Valve: The pulmonic valve was normal in structure. Pulmonic valve regurgitation is not visualized. Aorta: The aortic root and ascending aorta are structurally normal, with no evidence of dilitation. Venous: The inferior vena cava is normal in size with greater than 50% respiratory variability, suggesting right atrial pressure of 3 mmHg. IAS/Shunts: No atrial level shunt detected by color flow Doppler.  LEFT VENTRICLE PLAX 2D LVIDd:         4.20 cm   Diastology LVIDs:         3.00 cm   LV e' medial:    8.59 cm/s LV PW:         0.90 cm   LV E/e' medial:  10.2 LV IVS:        0.90 cm   LV e' lateral:   7.07 cm/s LVOT diam:     1.90 cm   LV E/e' lateral: 12.4 LV SV:         62 LV SV Index:   38 LVOT Area:     2.84 cm                           3D Volume EF:  3D EF:        69 %                          LV EDV:       96 ml                          LV ESV:       30 ml                          LV SV:        66 ml RIGHT VENTRICLE RV Basal diam:  3.20 cm RV S prime:     9.57 cm/s TAPSE (M-mode): 2.4 cm RVSP:           21.3 mmHg LEFT ATRIUM             Index        RIGHT ATRIUM           Index LA diam:        3.20 cm 1.97 cm/m   RA Pressure: 3.00 mmHg LA Vol (A2C):   33.6 ml 20.73 ml/m  RA Area:     14.20 cm LA Vol (A4C):   36.4 ml  22.46 ml/m  RA Volume:   36.70 ml  22.65 ml/m LA Biplane Vol: 35.5 ml 21.91 ml/m  AORTIC VALVE LVOT Vmax:   71.90 cm/s LVOT Vmean:  48.700 cm/s LVOT VTI:    0.220 m  AORTA Ao Root diam: 2.90 cm Ao Asc diam:  3.10 cm MITRAL VALVE                TRICUSPID VALVE MV Area (PHT):              TR Peak grad:   18.3 mmHg MV Decel Time:              TR Vmax:        214.00 cm/s MV E velocity: 88.00 cm/s   Estimated RAP:  3.00 mmHg MV A velocity: 113.00 cm/s  RVSP:           21.3 mmHg MV E/A ratio:  0.78                             SHUNTS                             Systemic VTI:  0.22 m                             Systemic Diam: 1.90 cm Zoila Shutter MD Electronically signed by Zoila Shutter MD Signature Date/Time: 11/24/2022/1:23:03 PM    Final     ASSESSMENT:   82 y.o. 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  No evidence of cytopenias related to the MGUS.  No hypercalcemia or AKI noted on her last labs. 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 In case of an infection, IVIG supplementation should be considered  (  a) received IVIG October 2016  (b) repeated IVIG October 2018  (c) repeated November 2019 and November 2020  (d) Evusheld May 2022   PLAN:  She is here for follow up of IgM MGUS and hypogammaglobulinemia. Since her last visit here, she had a few health issues including chest pain which was worked up and deemed to be noncardiac.  She also had worsening dyspepsia, had a visit with gastroenterology, now takes as needed Zofran and had an ultrasound done, pending results. She denies any fevers, drenching night sweats, unintentional loss of weight, persistent bone pain today. On physical exam,  she appears well, no palpable lymphadenopathy or hepatosplenomegaly, lungs clear to auscultation, no lower extremity edema CBC from today shows no evidence of cytopenias.  Rest of the labs are pending.  Review of labs from May with overall stable MGUS.  At this time we have discussed that there is no indication for treatment.  She can continue annual IVIG during the winters which was originally prescribed by Dr. 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  She is scheduled for this tomorrow.  She can return to clinic in May 2024 with repeat labs.  Total time spent: 30 minutes  *Total Encounter Time as defined by the Centers for Medicare and Medicaid Services includes, in addition to the face-to-face time of a patient visit (documented in the note above) non-face-to-face time: obtaining and reviewing outside history, ordering and reviewing medications, tests or procedures, care coordination (communications with other health care professionals or caregivers) and documentation in the medical record.

## 2022-12-23 ENCOUNTER — Inpatient Hospital Stay: Payer: Medicare HMO

## 2022-12-23 VITALS — BP 152/67 | HR 75 | Temp 99.1°F | Resp 18 | Ht 64.0 in | Wt 132.0 lb

## 2022-12-23 DIAGNOSIS — D801 Nonfamilial hypogammaglobulinemia: Secondary | ICD-10-CM

## 2022-12-23 DIAGNOSIS — D472 Monoclonal gammopathy: Secondary | ICD-10-CM | POA: Diagnosis not present

## 2022-12-23 LAB — KAPPA/LAMBDA LIGHT CHAINS
Kappa free light chain: 20.9 mg/L — ABNORMAL HIGH (ref 3.3–19.4)
Kappa, lambda light chain ratio: 4.86 — ABNORMAL HIGH (ref 0.26–1.65)
Lambda free light chains: 4.3 mg/L — ABNORMAL LOW (ref 5.7–26.3)

## 2022-12-23 MED ORDER — IMMUNE GLOBULIN (HUMAN) 20 GM/200ML IV SOLN
1.0000 g/kg | Freq: Once | INTRAVENOUS | Status: AC
  Administered 2022-12-23: 60 g via INTRAVENOUS
  Filled 2022-12-23: qty 600

## 2022-12-23 MED ORDER — DIPHENHYDRAMINE HCL 12.5 MG/5ML PO ELIX
12.5000 mg | ORAL_SOLUTION | Freq: Once | ORAL | Status: AC
  Administered 2022-12-23: 12.5 mg via ORAL
  Filled 2022-12-23: qty 5

## 2022-12-23 NOTE — Patient Instructions (Signed)

## 2022-12-26 LAB — MULTIPLE MYELOMA PANEL, SERUM
Albumin SerPl Elph-Mcnc: 3.6 g/dL (ref 2.9–4.4)
Albumin/Glob SerPl: 1.2 (ref 0.7–1.7)
Alpha 1: 0.2 g/dL (ref 0.0–0.4)
Alpha2 Glob SerPl Elph-Mcnc: 0.7 g/dL (ref 0.4–1.0)
B-Globulin SerPl Elph-Mcnc: 2 g/dL — ABNORMAL HIGH (ref 0.7–1.3)
Gamma Glob SerPl Elph-Mcnc: 0.3 g/dL — ABNORMAL LOW (ref 0.4–1.8)
Globulin, Total: 3.1 g/dL (ref 2.2–3.9)
IgA: 11 mg/dL — ABNORMAL LOW (ref 64–422)
IgG (Immunoglobin G), Serum: 309 mg/dL — ABNORMAL LOW (ref 586–1602)
IgM (Immunoglobulin M), Srm: 2029 mg/dL — ABNORMAL HIGH (ref 26–217)
M Protein SerPl Elph-Mcnc: 1.2 g/dL — ABNORMAL HIGH
Total Protein ELP: 6.7 g/dL (ref 6.0–8.5)

## 2023-01-05 ENCOUNTER — Other Ambulatory Visit: Payer: Self-pay | Admitting: Family Medicine

## 2023-01-05 DIAGNOSIS — E039 Hypothyroidism, unspecified: Secondary | ICD-10-CM

## 2023-01-06 ENCOUNTER — Ambulatory Visit: Payer: Medicare HMO | Admitting: Student

## 2023-01-11 ENCOUNTER — Encounter: Payer: Medicare HMO | Admitting: Family Medicine

## 2023-01-12 ENCOUNTER — Encounter: Payer: Self-pay | Admitting: Family Medicine

## 2023-01-12 ENCOUNTER — Ambulatory Visit (INDEPENDENT_AMBULATORY_CARE_PROVIDER_SITE_OTHER): Payer: Medicare HMO | Admitting: Family Medicine

## 2023-01-12 VITALS — BP 115/69 | HR 67 | Ht 64.0 in | Wt 133.8 lb

## 2023-01-12 DIAGNOSIS — D849 Immunodeficiency, unspecified: Secondary | ICD-10-CM | POA: Diagnosis not present

## 2023-01-12 DIAGNOSIS — K219 Gastro-esophageal reflux disease without esophagitis: Secondary | ICD-10-CM

## 2023-01-12 DIAGNOSIS — E039 Hypothyroidism, unspecified: Secondary | ICD-10-CM | POA: Diagnosis not present

## 2023-01-12 NOTE — Assessment & Plan Note (Signed)
Patient has MGUS.  Sees hematology/oncology.  Patient questions about the shingles vaccine.  I counseled her on vaccinations in general and specifically the Shingrix vaccine.  Advised patient it would be beneficial for her and it is not a risk for giving her shingles or activating a shingles outbreak.  Provided patient some information regarding this for her to read.  Advised her she can have this done at any time if she would like to schedule a nurse visit.

## 2023-01-12 NOTE — Patient Instructions (Signed)
It was nice to see you today,  We addressed the following topics today: - in three months we can check your TSH.     Have a great day,  Frederic Jericho, MD

## 2023-01-12 NOTE — Progress Notes (Signed)
Established Patient Office Visit  Subjective   Patient ID: Kimberly Merritt, female    DOB: [birth year]  Age: 82 y.o. MRN: [id number]  Chief Complaint  Patient presents with   Annual Exam    HPI Patient here for physical.  Patient currently lives alone.  Most of her immediate family has passed away.  Her neighbor is friends with her and checks on her periodically.  She is otherwise independent.  Patient sees Dr. Dorthey Sawyer, (previously Dr. Nile Riggs prior to retiring) for her eyes.  States she has been told she has started to develop macular degeneration.  Patient takes carvedilol and losartan.  Sees her cardiologist regularly.  Patient is taking her levothyroxine.  We discussed rechecking her TSH at her next visit in 3 months.  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  Patient saw her gastroenterologist Dr. Ewing Schlein within the past month for increasing reflux symptoms.  He advised her to take the Tagamet twice daily in addition to the Protonix in the afternoon.  She feels like this did not help significantly.  She has also started eating a more bland diet.  Symptoms generally occur after her noon meal and her evening meal.  She is a slow eater by nature.  She sleeps on her back and side.  Head of bed is already elevated.  Patient has started taking Gaviscon over-the-counter and feels like this has helped.  She has Carafate as well but does not use this regularly, only for severe pains.  She was also prescribed Zofran by Dr. Ewing Schlein for her nausea.  Prior to that was using ginger ale.  When she takes it it has been effective in helping her nausea.  Patient had left-sided great saphenous vein ablation in 2008.  Patient had some questions about if she should follow-up with her vein and vascular specialist regarding some pain along the location of the ablation.  Pain is in the medial thigh as well as the calf.  Has been ongoing for a few months.  She wears compression  stockings.  Has not noticed any asymmetrical swelling.  She had an episode of shortness of breath/dyspnea that awoke her from sleeping in September.  This resolved shortly after.  She had questions about those symptoms could be related to her venous issues in her legs.  Advised her it was unlikely to were related based on her description of the events   The ASCVD Risk score (Arnett DK, et al., 2019) failed to calculate for the following reasons:   The 2019 ASCVD risk score is only valid for ages 64 to 76  There are no preventive care reminders to display for this patient.    Objective:     BP 115/69   Pulse 67   Ht 5\' 4"  (1.626 m)   Wt 133 lb 12.8 oz (60.7 kg)   SpO2 97%   BMI 22.97 kg/m     Physical Exam General: Alert, oriented HEENT: PERRLA, EOMI CV: Regular rate and rhythm Pulmonary: Lungs are bilaterally MSK: Strength equal bilaterally Extremities: Patient wearing compression stockings bilaterally.  No asymmetry between left and right leg size noted.  Tenderness is minimal, especially in the calves. Psych: Pleasant affect   No results found for any visits on 01/12/23.      Assessment & Plan:   Hypothyroidism (acquired) Assessment & Plan: No changes to the patient's current dose.  Discussed checking TSH in 3 months prior to her next visit.  Orders: -  TSH; Future  Immunocompromised patient [facility]) Assessment & Plan: Patient has MGUS.  Sees hematology/oncology.  Patient questions about the shingles vaccine.  I counseled her on vaccinations in general and specifically the Shingrix vaccine.  Advised patient it would be beneficial for her and it is not a risk for giving her shingles or activating a shingles outbreak.  Provided patient some information regarding this for her to read.  Advised her she can have this done at any time if she would like to schedule a nurse visit.   Gastroesophageal reflux disease, unspecified whether esophagitis present Assessment &  Plan: Recently saw Dr. Ewing Schlein for worsening symptoms.  Now is taking Tagamet twice a day, Protonix in the evening.  Doing all the lifestyle changes recommended to her to help reduce symptoms.  Advised her she can take Gaviscon over-the-counter as needed.  The seem to be helping more than the increased Tagamet.      Return in about 3 months (around 04/12/2023) for tsh.    Sandre Kitty, MD

## 2023-01-12 NOTE — Assessment & Plan Note (Signed)
Recently saw Dr. Ewing Schlein for worsening symptoms.  Now is taking Tagamet twice a day, Protonix in the evening.  Doing all the lifestyle changes recommended to her to help reduce symptoms.  Advised her she can take Gaviscon over-the-counter as needed.  The seem to be helping more than the increased Tagamet.

## 2023-01-12 NOTE — Assessment & Plan Note (Signed)
No changes to the patient's current dose.  Discussed checking TSH in 3 months prior to her next visit.

## 2023-03-22 DIAGNOSIS — H35363 Drusen (degenerative) of macula, bilateral: Secondary | ICD-10-CM | POA: Diagnosis not present

## 2023-03-22 DIAGNOSIS — H353132 Nonexudative age-related macular degeneration, bilateral, intermediate dry stage: Secondary | ICD-10-CM | POA: Diagnosis not present

## 2023-03-22 DIAGNOSIS — H16102 Unspecified superficial keratitis, left eye: Secondary | ICD-10-CM | POA: Diagnosis not present

## 2023-03-22 DIAGNOSIS — H43811 Vitreous degeneration, right eye: Secondary | ICD-10-CM | POA: Diagnosis not present

## 2023-03-22 DIAGNOSIS — H04123 Dry eye syndrome of bilateral lacrimal glands: Secondary | ICD-10-CM | POA: Diagnosis not present

## 2023-04-05 ENCOUNTER — Other Ambulatory Visit: Payer: Medicare HMO

## 2023-04-05 DIAGNOSIS — H04123 Dry eye syndrome of bilateral lacrimal glands: Secondary | ICD-10-CM | POA: Diagnosis not present

## 2023-04-05 DIAGNOSIS — Z961 Presence of intraocular lens: Secondary | ICD-10-CM | POA: Diagnosis not present

## 2023-04-06 ENCOUNTER — Other Ambulatory Visit: Payer: Medicare HMO

## 2023-04-07 ENCOUNTER — Other Ambulatory Visit

## 2023-04-07 DIAGNOSIS — E039 Hypothyroidism, unspecified: Secondary | ICD-10-CM

## 2023-04-08 LAB — TSH: TSH: 2.12 u[IU]/mL (ref 0.450–4.500)

## 2023-04-12 ENCOUNTER — Ambulatory Visit (INDEPENDENT_AMBULATORY_CARE_PROVIDER_SITE_OTHER): Payer: Medicare HMO | Admitting: Family Medicine

## 2023-04-12 ENCOUNTER — Encounter: Payer: Self-pay | Admitting: Family Medicine

## 2023-04-12 VITALS — BP 134/71 | HR 62 | Ht 64.0 in | Wt 134.4 lb

## 2023-04-12 DIAGNOSIS — G250 Essential tremor: Secondary | ICD-10-CM

## 2023-04-12 DIAGNOSIS — J0110 Acute frontal sinusitis, unspecified: Secondary | ICD-10-CM | POA: Diagnosis not present

## 2023-04-12 DIAGNOSIS — K219 Gastro-esophageal reflux disease without esophagitis: Secondary | ICD-10-CM

## 2023-04-12 NOTE — Progress Notes (Unsigned)
   Established Patient Office Visit  Subjective   Patient ID: Kimberly Merritt, female    DOB: 10-23-1940  Age: 83 y.o. MRN: [id number]  Chief Complaint  Patient presents with   Medical Management of Chronic Issues    HPI  Sob -patient is complaining of some shortness of breath over the past few weeks.  Occurring with nasal congestion as well.  She started taking her allergy medicine and Flonase in the last 2 weeks for allergy season.  No other symptoms concerning for infection.  No leg swelling.  Tremors-patient notices tremors in her hands when she tries to do her hair.  Has been noticing this for the past several years and it has started to get worse.  Does not notice any tremor at rest.  Only noticeable when she is trying to eat something or put on close or fix her hair.  GERD-patient takes her Tagamet morning and p.m. before meals and Protonix after dinner.  Dry eyes, macular degeneration-patient has 2 eye doctors.  1 she sees specifically for macular degeneration but has not started any treatments for this yet.  Her other doctor recently prescribed her over-the-counter eyedrops to use for dry eye and nighttime gel drops.  The ASCVD Risk score (Arnett DK, et al., 2019) failed to calculate for the following reasons:   The 2019 ASCVD risk score is only valid for ages 62 to 21  There are no preventive care reminders to display for this patient.    Objective:     BP 134/71   Pulse 62   Ht 5\' 4"  (1.626 m)   Wt 134 lb 6.4 oz (61 kg)   SpO2 99%   BMI 23.07 kg/m  {Vitals History (Optional):23777}  Physical Exam General: Alert, oriented CV: Regular rate rhythm Pulmonary: Clear bilaterally no wheezes or crackles.  No respiratory distress Tremors: No pedal edema.   No results found for any visits on 04/12/23.      Assessment & Plan:   There are no diagnoses linked to this encounter.   Return in about 6 months (around 10/13/2023) for tremors, chronic f/u.    Sandre Kitty, MD

## 2023-04-12 NOTE — Patient Instructions (Addendum)
 It was nice to see you today,  We addressed the following topics today: - Your thyroid levels were normal.  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  These are benign tremors but they can get more pronounced as you get older.  You are already on a beta-blocker which is the typical first treatment for this.  It may be best to talk to a neurologist about the other medications available.  If you want to do this let us know and I can send in a referral - Continue your allergy medicine.   Have a great day,  Frederic Jericho, MD

## 2023-04-13 DIAGNOSIS — G250 Essential tremor: Secondary | ICD-10-CM | POA: Insufficient documentation

## 2023-04-13 NOTE — Assessment & Plan Note (Signed)
 Takes Tagamet morning and afternoon before meals and Protonix after dinner.  Better controlled on this regimen.

## 2023-04-13 NOTE — Assessment & Plan Note (Signed)
 Already on a beta-blocker.  She declines additional medication at this time.  Advised patient if it gets worse I would suggest referral to neurology to discuss additional medication options given her age

## 2023-04-13 NOTE — Assessment & Plan Note (Signed)
 Patient's shortness of breath complaint likely related to her sinus congestion and allergies.  Advised her to continue taking Flonase and antihistamine.

## 2023-04-18 ENCOUNTER — Encounter: Payer: Self-pay | Admitting: Nurse Practitioner

## 2023-04-18 ENCOUNTER — Ambulatory Visit: Payer: Medicare HMO | Attending: Nurse Practitioner | Admitting: Nurse Practitioner

## 2023-04-18 VITALS — BP 112/78 | HR 72 | Ht 64.0 in | Wt 133.4 lb

## 2023-04-18 DIAGNOSIS — E785 Hyperlipidemia, unspecified: Secondary | ICD-10-CM | POA: Diagnosis not present

## 2023-04-18 DIAGNOSIS — I428 Other cardiomyopathies: Secondary | ICD-10-CM | POA: Diagnosis not present

## 2023-04-18 DIAGNOSIS — E039 Hypothyroidism, unspecified: Secondary | ICD-10-CM | POA: Diagnosis not present

## 2023-04-18 DIAGNOSIS — I2510 Atherosclerotic heart disease of native coronary artery without angina pectoris: Secondary | ICD-10-CM

## 2023-04-18 DIAGNOSIS — I10 Essential (primary) hypertension: Secondary | ICD-10-CM

## 2023-04-18 DIAGNOSIS — I340 Nonrheumatic mitral (valve) insufficiency: Secondary | ICD-10-CM

## 2023-04-18 DIAGNOSIS — I8390 Asymptomatic varicose veins of unspecified lower extremity: Secondary | ICD-10-CM

## 2023-04-18 NOTE — Patient Instructions (Signed)
 Medication Instructions:  Your physician recommends that you continue on your current medications as directed. Please refer to the Current Medication list given to you today.  *If you need a refill on your cardiac medications before your next appointment, please call your pharmacy*   Lab Work: NONE ordered at this time of appointment   Testing/Procedures: NONE ordered at this time of appointment   Follow-Up: At [facility], you and your health needs are our priority.  As part of our continuing mission to provide you with exceptional heart care, we have created designated Provider Care Teams.  These Care Teams include your primary Cardiologist (physician) and Advanced Practice Providers (APPs -  Physician Assistants and Nurse Practitioners) who all work together to provide you with the care you need, when you need it.  We recommend signing up for the patient portal called "MyChart".  Sign up information is provided on this After Visit Summary.  MyChart is used to connect with patients for Virtual Visits (Telemedicine).  Patients are able to view lab/test results, encounter notes, upcoming appointments, etc.  Non-urgent messages can be sent to your provider as well.   To learn more about what you can do with MyChart, go to [email or web address].    Your next appointment:   6 month(s)  Provider:   Rollene Rotunda, MD     Other Instructions   1st Floor: - Lobby - Registration  - Pharmacy  - Lab - Cafe  2nd Floor: - PV Lab - Diagnostic Testing (echo, CT, nuclear med)  3rd Floor: - Vacant  4th Floor: - TCTS (cardiothoracic surgery) - AFib Clinic - Structural Heart Clinic - Vascular Surgery  - Vascular Ultrasound  5th Floor: - [facility] (general and EP) - Clinical Pharmacy for coumadin, hypertension, lipid, weight-loss medications, and med management appointments    Valet parking services will be available as well.

## 2023-04-18 NOTE — Progress Notes (Unsigned)
 Office Visit    Patient Name: Kimberly Merritt Date of Encounter: 04/18/2023  Primary Care Provider:  Sandre Kitty, MD Primary Cardiologist:  Rollene Rotunda, MD  Chief Complaint    83 year old female with a history of nonobstructive CAD, NICM with recovered EF, mild mitral valve regurgitation, chronic venous insufficiency, varicose veins, hypertension, hyperlipidemia, hypothyroidism, and GERD who presents for follow-up related to CAD and NICM.  Past Medical History    Past Medical History:  Diagnosis Date   Allergy    CAD (coronary artery disease)    Nonobstructive. LAD 30% stenosis, small PDA of the circumflex 70% stenosis, right coronary artery 40% stenosis. 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   LOW BACK PAIN 08/29/2006   MONOCLONAL GAMMOPATHY 03/03/2010   MUSCLE SPASM 03/03/2010   Nonischemic cardiomyopathy (HCC)    EF 30%   OSTEOARTHRITIS 08/29/2006   OSTEOPENIA 11/19/2008   PARESTHESIA 03/05/2009   PVC's (premature ventricular contractions)    Scoliosis    VARICOSE VEINS, LOWER EXTREMITIES 08/29/2006   Past Surgical History:  Procedure Laterality Date   ABDOMINAL HYSTERECTOMY  1981   APPENDECTOMY     BACK SURGERY  2010,2006   lumb fusion   BUNIONECTOMY     both   BUNIONECTOMY Right 1985   CARPAL TUNNEL RELEASE     both   CARPAL TUNNEL RELEASE Right 1992   CARPAL TUNNEL RELEASE Left 1992   CATARACT EXTRACTION     both   CATARACT EXTRACTION Right 2008   CATARACT EXTRACTION Left 2008   HAMMER TOE SURGERY Left 1987   HAMMER TOE SURGERY Right 1986   LEFT AND RIGHT HEART CATHETERIZATION WITH CORONARY ANGIOGRAM N/A 10/04/2013   Procedure: LEFT AND RIGHT HEART CATHETERIZATION WITH CORONARY  ANGIOGRAM;  Surgeon: Marykay Lex, MD;  Location: [facility] CATH LAB;  Service: Cardiovascular;  Laterality: N/A;   LUMBAR FUSION  2006   LUMBAR FUSION  2010   MOHS SURGERY     right cheek   OOPHORECTOMY  1967   OVARIAN CYST REMOVAL     ROTATOR CUFF REPAIR Right 2013   SHOULDER ARTHROSCOPY  02/09/2011   Procedure: ARTHROSCOPY SHOULDER;  Surgeon: Velna Ochs, MD;  Location: [facility];  Service: Orthopedics;  Laterality: Right;  right shoulder arthroscopy subacromial decompression with rotator cuff repair   TONSILLECTOMY      Allergies  Allergies  Allergen Reactions   Acetaminophen Rash    REACTION: high dose - tremor Per pt - rash on chest and heart palpitations   Privigen [Immune Globulin (Human)] Other (See Comments)    Pt had a rxn; see hypersensitivity note from 01/15/22 at 1259.    Alendronate Sodium     Stomach upset.    Codeine     May interfere with heart condition     Diflunisal     Stomach pain.     Doxycycline     n/v   Flexeril [Cyclobenzaprine]     Abdominal pain.     Iodinated Contrast Media Other (See Comments)    Syncope.    Lisinopril Other (See Comments)   Nabumetone     Abdominal pain; dark, tarry stool.     Other  Other (See Comments)   Oxybutynin     Pt does not remember.     Raloxifene     Unknown.     Sulfa Antibiotics Nausea Only    Syncope.     Symbicort [Budesonide-Formoterol Fumarate]     "Jittery-ness."    Tramadol Hcl     Numbness.     Tramadol Hcl Itching    Numbness.     Diclofenac Palpitations    Dark, tarry stool.     Latex Rash     Labs/Other Studies Reviewed    The following studies were reviewed today:  Cardiac Studies & Procedures   ______________________________________________________________________________________________   STRESS TESTS  MYOCARDIAL PERFUSION IMAGING 11/24/2022  Narrative   Resting ECG shows non specific ST/T wave abnormality   A pharmacological stress test was performed using  IV Lexiscan 0.4mg  over 10 seconds performed without concurrent submaximal exercise. The patient reported headaches during the stress test. Normal blood pressure and normal heart rate response noted during stress. Heart rate recovery was normal.   No ST deviation was noted from baseline EKG.   Diaphragmatic attenuation artifact was present. Image quality affected due to significant extracardiac activity.   LV perfusion is abnormal. There is no evidence of ischemia. There is no evidence of infarction. Defect 1: There is a small defect with mild reduction in uptake present in the apical apex location(s) that is fixed. There is normal wall motion in the defect area. Consistent with artifact caused by bowel tracer uptake and diaphragmatic attenuation.   Left ventricular function is normal. Nuclear stress EF: 57%. The left ventricular ejection fraction is normal (55-65%). End diastolic cavity size is normal. End systolic cavity size is normal. No evidence of transient ischemic dilation (TID) noted.   Prior study available for comparison from 07/20/2019.   The study is normal. The study is low risk.   ECHOCARDIOGRAM  ECHOCARDIOGRAM COMPLETE 11/24/2022  Narrative ECHOCARDIOGRAM REPORT    Patient Name:   Kimberly Merritt Date of Exam: 11/24/2022 Medical Rec #:  [id number]         Height:       63.0 in Accession #:    [id number]        Weight:       132.0 lb Date of Birth:  1940-05-29         BSA:          1.621 m Patient Age:    82 years          BP:           150/79 mmHg Patient Gender: F                 HR:           60 bpm. Exam Location:  [address]  Procedure: 2D Echo, 3D Echo, Cardiac Doppler, Color Doppler and Strain Analysis  Indications:    R07.9 Chest Pain  History:        Patient has no prior history of Echocardiogram examinations. Nonischemic cardiomyopathy; Risk Factors:Hypertension.  Sonographer:    Clearence Ped RCS Referring Phys: [id number] Carlos Levering  IMPRESSIONS   1. Left ventricular ejection fraction, by estimation, is 60 to 65%. The left ventricle has normal function. The left ventricle has no regional wall motion abnormalities. Left ventricular diastolic parameters are consistent with Grade I diastolic dysfunction (impaired relaxation). 2. Right ventricular systolic function is low normal. The right ventricular size is normal. There is normal pulmonary artery systolic pressure.  The estimated right ventricular systolic pressure is 21.3 mmHg. 3. The mitral valve is abnormal. Mild mitral valve regurgitation. 4. The aortic valve is tricuspid. Aortic valve regurgitation is not visualized. 5. The inferior vena cava is normal in size with greater than 50% respiratory variability, suggesting right atrial pressure of 3 mmHg.  Comparison(s): Changes from prior study are noted. 08/24/2019: LVEF 60-65%.  FINDINGS Left Ventricle: Left ventricular ejection fraction, by estimation, is 60 to 65%. The left ventricle has normal function. The left ventricle has no regional wall motion abnormalities. The left ventricular internal cavity size was normal in size. There is no left ventricular hypertrophy. Left ventricular diastolic parameters are consistent with Grade I diastolic dysfunction (impaired relaxation). Indeterminate filling pressures.  Right Ventricle: The right ventricular size is normal. No increase in right ventricular wall thickness. Right ventricular systolic function is low normal. There is normal pulmonary artery systolic pressure. The tricuspid regurgitant velocity is 2.14 m/s, and with an assumed right atrial pressure of 3 mmHg, the estimated right ventricular systolic pressure is 21.3 mmHg.  Left Atrium: Left atrial size was normal in size.  Right Atrium: Right atrial size was normal in size.  Pericardium: There is no evidence of pericardial effusion.  Mitral Valve: The mitral valve is abnormal. There is mild thickening of  the anterior and posterior mitral valve leaflet(s). Mild mitral valve regurgitation.  Tricuspid Valve: The tricuspid valve is grossly normal. Tricuspid valve regurgitation is trivial.  Aortic Valve: The aortic valve is tricuspid. Aortic valve regurgitation is not visualized.  Pulmonic Valve: The pulmonic valve was normal in structure. Pulmonic valve regurgitation is not visualized.  Aorta: The aortic root and ascending aorta are structurally normal, with no evidence of dilitation.  Venous: The inferior vena cava is normal in size with greater than 50% respiratory variability, suggesting right atrial pressure of 3 mmHg.  IAS/Shunts: No atrial level shunt detected by color flow Doppler.   LEFT VENTRICLE PLAX 2D LVIDd:         4.20 cm   Diastology LVIDs:         3.00 cm   LV e' medial:    8.59 cm/s LV PW:         0.90 cm   LV E/e' medial:  10.2 LV IVS:        0.90 cm   LV e' lateral:   7.07 cm/s LVOT diam:     1.90 cm   LV E/e' lateral: 12.4 LV SV:         62 LV SV Index:   38 LVOT Area:     2.84 cm  3D Volume EF: 3D EF:        69 % LV EDV:       96 ml LV ESV:       30 ml LV SV:        66 ml  RIGHT VENTRICLE RV Basal diam:  3.20 cm RV S prime:     9.57 cm/s TAPSE (M-mode): 2.4 cm RVSP:           21.3 mmHg  LEFT ATRIUM             Index        RIGHT ATRIUM           Index LA diam:        3.20 cm 1.97 cm/m   RA Pressure: 3.00 mmHg LA Vol (A2C):   33.6 ml 20.73 ml/m  RA Area:  14.20 cm LA Vol (A4C):   36.4 ml 22.46 ml/m  RA Volume:   36.70 ml  22.65 ml/m LA Biplane Vol: 35.5 ml 21.91 ml/m AORTIC VALVE LVOT Vmax:   71.90 cm/s LVOT Vmean:  48.700 cm/s LVOT VTI:    0.220 m  AORTA Ao Root diam: 2.90 cm Ao Asc diam:  3.10 cm  MITRAL VALVE                TRICUSPID VALVE MV Area (PHT):              TR Peak grad:   18.3 mmHg MV Decel Time:              TR Vmax:        214.00 cm/s MV E velocity: 88.00 cm/s   Estimated RAP:  3.00 mmHg MV A velocity: 113.00 cm/s   RVSP:           21.3 mmHg MV E/A ratio:  0.78 SHUNTS Systemic VTI:  0.22 m Systemic Diam: 1.90 cm  Zoila Shutter MD Electronically signed by Zoila Shutter MD Signature Date/Time: 11/24/2022/1:23:03 PM    Final          ______________________________________________________________________________________________     Recent Labs: 12/22/2022: ALT 15; BUN 22; Creatinine 0.99; Hemoglobin 12.0; Platelet Count 322; Potassium 4.3; Sodium 135 04/07/2023: TSH 2.120  Recent Lipid Panel    Component Value Date/Time   CHOL 185 01/23/2020 0822   TRIG 37 01/23/2020 0822   HDL 68 01/23/2020 0822   CHOLHDL 2.7 01/23/2020 0822   CHOLHDL 2 06/08/2011 0830   VLDL 6.4 06/08/2011 0830   LDLCALC 109 (H) 01/23/2020 0822   LDLDIRECT 106 (H) 10/23/2018 1110   LDLDIRECT 131.0 02/05/2008 0931    History of Present Illness    83 year old female with the above past medical history including nonobstructive CAD, NICM with recovered EF, mild mitral valve regurgitation, chronic venous insufficiency, varicose veins, hypertension, hyperlipidemia, hypothyroidism, and GERD.  She underwent cardiac catheterization in 2015 in the setting of new cardiomyopathy (echo showed EF 30 to 35%), atypical chest pain, revealed moderate disease in the L PDA (not amenable to PCI), otherwise, minimal disease throughout, moderate to severely reduced LV function, EF approximately 30 to 35%.  Echo in 2018 showed EF improved to 55 to 60%, G1 DD.  Nuclear stress test in 2021 showed normal perfusion, no evidence of ischemia or infarction.  Repeat echocardiogram in 2021 showed EF improved to 60 to 65%, no RWMA, G1 DD, normal RV function, mild MR.  History of chronic venous insufficiency, varicose veins s/p laser ablation of the left great saphenous vein in 2008.  She was last seen in the office on 11/01/2022 and noted a 1 month history of left-sided chest pressure, not associated with exertion, dyspnea on exertion.  Repeat echocardiogram  in 11/2022 showed EF 60 to 65%, normal LV function, no RWMA, G1 DD, low normal RV systolic function, normal PASP, mild mitral valve regurgitation. Lexiscan Myoview in 11/2022 was negative for ischemia.  It was noted that her symptoms were felt to be musculoskeletal in nature.   She presents today for follow-up. Since her last visit  Stable from a cardiac standpoint.  She notes rare fleeting chest discomfort, denies any significant symptoms concerning for angina.  She has stable nonpitting bilateral lower extremity edema, improved with compression, elevation.  She does note occasional left thigh pain, she has not followed up with vascular surgery, overdue.  Denies dyspnea, PND, orthopnea, weight gain.  Denies  palpitations.  She is suffering from allergies.  She has developed an essential tremor.  CP discussed neurology referral.  Follow-up in 6 months.  We discussed possible repeat fasting lipid panel, she declines today.  Consider at follow-up.  She is on statin therapy.  BP stable.  CAD: Catheterization in 2015 showed nonobstructive CAD.   Lexiscan Myoview in 11/2022 was negative for ischemia.  NICM: Echocardiogram in 11/2022 showed EF 60 to 65%, normal LV function, no RWMA, G1 DD, low normal RV systolic function, normal PASP, mild mitral valve regurgitation Mitral valve regurgitation:  Chronic venous insufficiency/varicose veins: Hypertension: Hyperlipidemia:  Hypothyroidism: Disposition:   Home Medications    Current Outpatient Medications  Medication Sig Dispense Refill   Carboxymethylcellulose Sodium (REFRESH LIQUIGEL OP) Apply to eye.     carvedilol (COREG) 12.5 MG tablet Take 1 tablet (12.5 mg total) by mouth 2 (two) times daily. 180 tablet 3   Cholecalciferol (VITAMIN D3) 50 MCG (2000 UT) capsule Take 1 capsule by mouth daily.     cimetidine (TAGAMET) 200 MG tablet Take 200 mg by mouth daily.     cromolyn (NASALCROM) 5.2 MG/ACT nasal spray Place 1 spray into both nostrils 4 (four)  times daily. 26 mL 5   fluticasone (FLONASE) 50 MCG/ACT nasal spray Place 2 sprays into both nostrils daily.     FLUZONE HIGH-DOSE 0.5 ML injection      Glucosamine-Chondroitin 1500-1200 MG/30ML LIQD Take 1 tablet by mouth daily.     losartan (COZAAR) 25 MG tablet Take 1 tablet (25 mg total) by mouth daily. 90 tablet 3   ondansetron (ZOFRAN) 4 MG tablet 1 tablet Orally Every 8-12 hours as needed for nausea for 30 days     pantoprazole (PROTONIX) 40 MG tablet Take 40 mg by mouth daily.     Povidone (IVIZIA DRY EYES OP) Apply to eye.     sucralfate (CARAFATE) 1 g tablet Take 1 g by mouth daily as needed.      SYNTHROID 25 MCG tablet TAKE 1 TABLET (25 MCG TOTAL) BY MOUTH DAILY BEFORE BREAKFAST. 90 tablet 1   tiZANidine (ZANAFLEX) 2 MG tablet Take 2 mg by mouth at bedtime as needed.     No current facility-administered medications for this visit.     Review of Systems    ***.  All other systems reviewed and are otherwise negative except as noted above.    Physical Exam    VS:  BP 112/78 (BP Location: Left Arm, Patient Position: Sitting, Cuff Size: Normal)   Pulse 72   Ht 5\' 4"  (1.626 m)   Wt 133 lb 6.4 oz (60.5 kg)   SpO2 99%   BMI 22.90 kg/m  , BMI Body mass index is 22.9 kg/m.     GEN: Well nourished, well developed, in no acute distress. HEENT: normal. Neck: Supple, no JVD, carotid bruits, or masses. Cardiac: RRR, no murmurs, rubs, or gallops. No clubbing, cyanosis, edema.  Radials/DP/PT 2+ and equal bilaterally.  Respiratory:  Respirations regular and unlabored, clear to auscultation bilaterally. GI: Soft, nontender, nondistended, BS + x 4. MS: no deformity or atrophy. Skin: warm and dry, no rash. Neuro:  Strength and sensation are intact. Psych: Normal affect.  Accessory Clinical Findings    ECG personally reviewed by me today -    - no acute changes.   Lab Results  Component Value Date   WBC 8.2 12/22/2022   HGB 12.0 12/22/2022   HCT 36.0 12/22/2022   MCV 95.0  12/22/2022  PLT 322 12/22/2022   Lab Results  Component Value Date   CREATININE 0.99 12/22/2022   BUN 22 12/22/2022   NA 135 12/22/2022   K 4.3 12/22/2022   CL 99 12/22/2022   CO2 31 12/22/2022   Lab Results  Component Value Date   ALT 15 12/22/2022   AST 19 12/22/2022   ALKPHOS 77 12/22/2022   BILITOT 0.4 12/22/2022   Lab Results  Component Value Date   CHOL 185 01/23/2020   HDL 68 01/23/2020   LDLCALC 109 (H) 01/23/2020   LDLDIRECT 106 (H) 10/23/2018   TRIG 37 01/23/2020   CHOLHDL 2.7 01/23/2020    Lab Results  Component Value Date   HGBA1C 5.1 01/23/2020    Assessment & Plan    1.  ***      Joylene Grapes, NP 04/18/2023, 11:44 AM

## 2023-04-19 ENCOUNTER — Encounter: Payer: Self-pay | Admitting: Nurse Practitioner

## 2023-04-19 ENCOUNTER — Ambulatory Visit: Payer: Self-pay | Admitting: Family Medicine

## 2023-04-19 NOTE — Telephone Encounter (Signed)
  Chief Complaint: Patient wants to know if PCP recommends that she continue to take vitamin D- is there on without soy additive in it.  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 She did not have a reaction- but does not want to push her luck.   Disposition: [] ED /[] Urgent Care (no appt availability in office) / [] Appointment(In office/virtual)/ []  [facility]/ [] Home Care/ [] Refused Recommended Disposition /[] [facility]/ [x]  Follow-up with PCP Additional Notes: Patient would like to know if PCP feels it is necessary and if so- is there a supplement that would be safe- without soy in it?    Copied from CRM [id number]. Topic: Clinical - Medication Question >> Apr 19, 2023  1:32 PM Elle L wrote: Reason for CRM: The patient has been taking Vitamin D 2000 IU and she has not paid attention to see that it has soy in it. Her face breaks down in a red rash and down her throat when she has soy usually but she is not currently having side effects from the Vitamin D. She is requesting to see if she should stop or continue the supplement. She feels that she gets sufficient Vitamin D through natural sources. She was put on it by a former Primary Care Provider due to her bone density test results. [name] call back number [phone]. Reason for Disposition  [1] Caller has NON-URGENT medicine question about med that PCP prescribed AND [2] triager unable to answer question  Answer Assessment - Initial Assessment Questions 1. NAME of MEDICINE: "What medicine(s) are you calling about?"     Cholecalciferol (VITAMIN D3) 50 MCG (2000 UT) capsule- OTC supplement- [employer] made 2. QUESTION: "What is your question?" (e.g., double dose of medicine, side effect)     Patient has hx of reaction to soy- she is concerned because she gets mouth/throat rash and the supplement she has been taking has soy listed  as inactive ingredient . She has finished the bottle- but would like PCP advice 3. PRESCRIBER: "Who prescribed the medicine?" Reason: if prescribed by specialist, call should be referred to that group.     Former PCP 4. SYMPTOMS: "Do you have any symptoms?" If Yes, ask: "What symptoms are you having?"  "How bad are the symptoms (e.g., mild, moderate, severe)      No symptoms at this time- patient wonders if it is really necessary for her to take vitamin D- is there an option without soy in it? Patient states she eats a lot of meals/foods with vitamin D  Protocols used: Medication Question Call-A-AH

## 2023-04-20 NOTE — Telephone Encounter (Signed)
 Called pt she is advised of the recommendation

## 2023-04-20 NOTE — Telephone Encounter (Signed)
 Please let the patient know there are several brands that don't contain soy.  [employer] is one of them.  I am not sure what stores they are located in though. The easiest way to find a brand in the stores that is soy free would be to go to a store like [employer] or [employer] and ask the person working there if they could show you the soy free vitamin D options.  The people at those stores in particular will know which brands to get whereas a employee at a typical pharmacy or [facility] might not

## 2023-05-02 ENCOUNTER — Other Ambulatory Visit: Payer: Self-pay | Admitting: Family Medicine

## 2023-05-12 DIAGNOSIS — H04123 Dry eye syndrome of bilateral lacrimal glands: Secondary | ICD-10-CM | POA: Diagnosis not present

## 2023-05-12 DIAGNOSIS — H1045 Other chronic allergic conjunctivitis: Secondary | ICD-10-CM | POA: Diagnosis not present

## 2023-05-23 DIAGNOSIS — L820 Inflamed seborrheic keratosis: Secondary | ICD-10-CM | POA: Diagnosis not present

## 2023-05-23 DIAGNOSIS — M7061 Trochanteric bursitis, right hip: Secondary | ICD-10-CM | POA: Diagnosis not present

## 2023-05-23 DIAGNOSIS — D1801 Hemangioma of skin and subcutaneous tissue: Secondary | ICD-10-CM | POA: Diagnosis not present

## 2023-05-23 DIAGNOSIS — Z85828 Personal history of other malignant neoplasm of skin: Secondary | ICD-10-CM | POA: Diagnosis not present

## 2023-05-23 DIAGNOSIS — L538 Other specified erythematous conditions: Secondary | ICD-10-CM | POA: Diagnosis not present

## 2023-05-23 DIAGNOSIS — C44729 Squamous cell carcinoma of skin of left lower limb, including hip: Secondary | ICD-10-CM | POA: Diagnosis not present

## 2023-05-23 DIAGNOSIS — Z789 Other specified health status: Secondary | ICD-10-CM | POA: Diagnosis not present

## 2023-05-23 DIAGNOSIS — L821 Other seborrheic keratosis: Secondary | ICD-10-CM | POA: Diagnosis not present

## 2023-05-23 DIAGNOSIS — L2989 Other pruritus: Secondary | ICD-10-CM | POA: Diagnosis not present

## 2023-05-23 DIAGNOSIS — R229 Localized swelling, mass and lump, unspecified: Secondary | ICD-10-CM | POA: Diagnosis not present

## 2023-05-23 DIAGNOSIS — R208 Other disturbances of skin sensation: Secondary | ICD-10-CM | POA: Diagnosis not present

## 2023-05-23 DIAGNOSIS — Z08 Encounter for follow-up examination after completed treatment for malignant neoplasm: Secondary | ICD-10-CM | POA: Diagnosis not present

## 2023-05-23 DIAGNOSIS — L814 Other melanin hyperpigmentation: Secondary | ICD-10-CM | POA: Diagnosis not present

## 2023-05-23 DIAGNOSIS — L308 Other specified dermatitis: Secondary | ICD-10-CM | POA: Diagnosis not present

## 2023-06-21 ENCOUNTER — Ambulatory Visit: Payer: Medicare HMO | Admitting: Hematology and Oncology

## 2023-06-21 ENCOUNTER — Other Ambulatory Visit: Payer: Medicare HMO

## 2023-07-05 ENCOUNTER — Telehealth: Payer: Self-pay

## 2023-07-05 ENCOUNTER — Other Ambulatory Visit: Payer: Self-pay

## 2023-07-05 ENCOUNTER — Other Ambulatory Visit: Payer: Self-pay | Admitting: Family Medicine

## 2023-07-05 DIAGNOSIS — D801 Nonfamilial hypogammaglobulinemia: Secondary | ICD-10-CM

## 2023-07-05 DIAGNOSIS — E039 Hypothyroidism, unspecified: Secondary | ICD-10-CM

## 2023-07-05 NOTE — Telephone Encounter (Signed)
 Verbally confirmed appt for 6/4

## 2023-07-06 ENCOUNTER — Encounter: Payer: Self-pay | Admitting: Hematology and Oncology

## 2023-07-06 ENCOUNTER — Inpatient Hospital Stay

## 2023-07-06 ENCOUNTER — Inpatient Hospital Stay: Attending: Hematology and Oncology | Admitting: Hematology and Oncology

## 2023-07-06 VITALS — BP 152/68 | HR 68 | Temp 98.3°F | Resp 16 | Wt 134.8 lb

## 2023-07-06 DIAGNOSIS — Z79899 Other long term (current) drug therapy: Secondary | ICD-10-CM | POA: Diagnosis not present

## 2023-07-06 DIAGNOSIS — D801 Nonfamilial hypogammaglobulinemia: Secondary | ICD-10-CM | POA: Insufficient documentation

## 2023-07-06 DIAGNOSIS — D472 Monoclonal gammopathy: Secondary | ICD-10-CM | POA: Diagnosis not present

## 2023-07-06 LAB — CBC WITH DIFFERENTIAL (CANCER CENTER ONLY)
Abs Immature Granulocytes: 0.03 10*3/uL (ref 0.00–0.07)
Basophils Absolute: 0.1 10*3/uL (ref 0.0–0.1)
Basophils Relative: 1 %
Eosinophils Absolute: 0.2 10*3/uL (ref 0.0–0.5)
Eosinophils Relative: 2 %
HCT: 34.9 % — ABNORMAL LOW (ref 36.0–46.0)
Hemoglobin: 11.6 g/dL — ABNORMAL LOW (ref 12.0–15.0)
Immature Granulocytes: 0 %
Lymphocytes Relative: 42 %
Lymphs Abs: 3.9 10*3/uL (ref 0.7–4.0)
MCH: 30.9 pg (ref 26.0–34.0)
MCHC: 33.2 g/dL (ref 30.0–36.0)
MCV: 92.8 fL (ref 80.0–100.0)
Monocytes Absolute: 1.1 10*3/uL — ABNORMAL HIGH (ref 0.1–1.0)
Monocytes Relative: 11 %
Neutro Abs: 4.1 10*3/uL (ref 1.7–7.7)
Neutrophils Relative %: 44 %
Platelet Count: 287 10*3/uL (ref 150–400)
RBC: 3.76 MIL/uL — ABNORMAL LOW (ref 3.87–5.11)
RDW: 12 % (ref 11.5–15.5)
WBC Count: 9.4 10*3/uL (ref 4.0–10.5)
nRBC: 0 % (ref 0.0–0.2)

## 2023-07-06 LAB — CMP (CANCER CENTER ONLY)
ALT: 15 U/L (ref 0–44)
AST: 17 U/L (ref 15–41)
Albumin: 3.8 g/dL (ref 3.5–5.0)
Alkaline Phosphatase: 70 U/L (ref 38–126)
Anion gap: 6 (ref 5–15)
BUN: 22 mg/dL (ref 8–23)
CO2: 29 mmol/L (ref 22–32)
Calcium: 8.8 mg/dL — ABNORMAL LOW (ref 8.9–10.3)
Chloride: 99 mmol/L (ref 98–111)
Creatinine: 0.88 mg/dL (ref 0.44–1.00)
GFR, Estimated: >60 mL/min (ref >60)
Glucose, Bld: 109 mg/dL — ABNORMAL HIGH (ref 70–99)
Potassium: 4.7 mmol/L (ref 3.5–5.1)
Sodium: 134 mmol/L — ABNORMAL LOW (ref 135–145)
Total Bilirubin: 0.3 mg/dL (ref 0.0–1.2)
Total Protein: 7.1 g/dL (ref 6.5–8.1)

## 2023-07-06 NOTE — Progress Notes (Signed)
 [facility]  Telephone:([phone] Fax:(336) [phone]     ID: [name] DOB: 05/07/1940  MR#: [id number]  JYN#:[id number]  Patient Care Team: Laneta Pintos, MD as PCP - General (Family Medicine) Eilleen Grates, MD as PCP - Cardiology (Cardiology) Dayne Even, MD as Attending Physician (Orthopedic Surgery) Ozell Blunt, MD (Gastroenterology) Albert Huff, MD as Attending Physician (Ophthalmology) Yvonna Herder, MD as Consulting Physician (Neurosurgery) Eilleen Grates, MD as Consulting Physician (Cardiology) Wash Hack, MD as Referring Physician (Dermatology) Homero Luster, MD as Attending Physician (Urology) Zara Heymann, MD as Referring Physician (Allergy) Richrd Char, MD (Inactive) as Consulting Physician (Vascular Surgery) Shon Downing, MD as Consulting Physician (Ophthalmology) Murleen Arms, MD as Consulting Physician (Hematology and Oncology) OTHER MD: Eilleen Grates MD, Praveen mannam MD  CHIEF COMPLAINT: IgM M-GUS/ low-grade lymphoproliferative process  CURRENT TREATMENT: IVIG as needed; Evusheld  INTERVAL HISTORY  Kimberly Merritt returns today for 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  Since her last visit here, she has had a few health issues.  She apparently experienced some chest pain on the left side and went to see her cardiologist, they did a stress test and an echocardiogram and deemed that the pain is noncardiac.  She then followed up with her PCP who noticed a palpable tenderness in the left chest wall consistent with musculoskeletal pain and this has since resolved.  She also went to her GI doctor Dr. Prentiss Brocks because she was experiencing more dyspepsia, nausea and she was prescribed Zofran  for nausea as needed.  We also ordered an ultrasound of her abdomen, this has been completed but not read yet.  Other than this, she has got a couple cold in the past 2 months.  Her arthritis is acting up given the  colder weather and the change in temperature.  No fevers, drenching night sweats, unintentional loss of weight.  No persistent bone pains.  No change in breathing reported. Rest of the pertinent 10 point ROS reviewed and negative   COVID 19 VACCINATION STATUS: fully vaccinated [employer]), with booster 10/2019   HISTORY PRESENT ILLNESS::  from the earlier summary note:  "Kimberly Merritt" Blunt is a 83 y/o [place] woman I evaluated originally in October of 2004 for a monoclonal gammopathy. At that time she had a white cell count of 7.2, hemoglobin 12.8, MCV 94.5, and platelets 298,000. Protein workup showed an IgM kappa paraprotein of 0.6 g, with a total IgG slightly depressed at 556, but a normal IgA at 82. Because an IgM paraprotein can be associated with a low grade lymphoproliferative process (Waldenstrm's macroglobulinemia) [name] underwent bone marrow biopsy 11/27/2002, which showed ([id number]) a normal cellular marrow with trilineage hematopoiesis and 5% plasma cells. The peripheral blood film showed no significant abnormalities and on the biopsy para-spicular lymphoid aggregates were not noted. 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  The patient was followed with observation alone through May of 2009, with no evidence of progression in her IgM kappa clone. As of 06/03/2007 her total IgM spike was 0.83, her IgG was 490 and her IgA was 36. The patient was released from followup here at that time.  More recently, as the 10 year anniversary of her original bone marrow biopsy past, Dr. Otho Blitz of referred the patient back for reassessment and possible bone marrow rebiopsy. Kimberly Merritt was scheduled here for lab work 09/25/2013. However when she had her blood drawn she complained of some shortness of breath and her pulse was  checked. It was 33. The patient was referred to the emergency room where she was evaluated by Dr. Lavonne Prairie who felt the patient's true heart rate  was higher (she has ectopic beats which generated a week pulse and may have been missed) since in the emergency room EKG her rate was 98 with normal sinus rhythm. Troponins were negative. BNP was slightly elevated.  Dr. Lavonne Prairie set the patient up for an echocardiogram 09/27/2013 and this showed an ejection fraction of 30-35%. There was diffuse hypokinesis. She underwent right and left heart catheterization 10/04/2013 which showed no significant obstructions. The study did confirm an ejection fraction of roughly 35% with global hypokinesis. She is followed by Dr. Lavonne Prairie for her new diagnosis of non-ischemic cardiomyopathy.  Her subsequent history is as detailed below   PAST MEDICAL HISTORY: Past Medical History:  Diagnosis Date   Allergy    CAD (coronary artery disease)    Nonobstructive. LAD 30% stenosis, small PDA of the circumflex 70% stenosis, right coronary artery 40% stenosis. 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   LOW BACK PAIN 08/29/2006   MONOCLONAL GAMMOPATHY 03/03/2010   MUSCLE SPASM 03/03/2010   Nonischemic cardiomyopathy (HCC)    EF 30%   OSTEOARTHRITIS 08/29/2006   OSTEOPENIA 11/19/2008   PARESTHESIA 03/05/2009   PVC's (premature ventricular contractions)    Scoliosis    VARICOSE VEINS, LOWER EXTREMITIES 08/29/2006    PAST SURGICAL HISTORY: Past Surgical History:  Procedure Laterality Date   ABDOMINAL HYSTERECTOMY  1981   APPENDECTOMY     BACK SURGERY  2010,2006   lumb fusion   BUNIONECTOMY     both   BUNIONECTOMY Right 1985   CARPAL TUNNEL RELEASE     both   CARPAL TUNNEL RELEASE Right 1992   CARPAL TUNNEL RELEASE Left 1992   CATARACT EXTRACTION     both   CATARACT EXTRACTION Right 2008    CATARACT EXTRACTION Left 2008   HAMMER TOE SURGERY Left 1987   HAMMER TOE SURGERY Right 1986   LEFT AND RIGHT HEART CATHETERIZATION WITH CORONARY ANGIOGRAM N/A 10/04/2013   Procedure: LEFT AND RIGHT HEART CATHETERIZATION WITH CORONARY ANGIOGRAM;  Surgeon: Arleen Lacer, MD;  Location: [facility] CATH LAB;  Service: Cardiovascular;  Laterality: N/A;   LUMBAR FUSION  2006   LUMBAR FUSION  2010   MOHS SURGERY     right cheek   OOPHORECTOMY  1967   OVARIAN CYST REMOVAL     ROTATOR CUFF REPAIR Right 2013   SHOULDER ARTHROSCOPY  02/09/2011   Procedure: ARTHROSCOPY SHOULDER;  Surgeon: Alphonzo Ask, MD;  Location:  SURGERY CENTER;  Service: Orthopedics;  Laterality: Right;  right shoulder arthroscopy subacromial decompression with rotator cuff repair   TONSILLECTOMY      FAMILY HISTORY Family History  Problem Relation Age of Onset   Stroke Mother    Cancer Father    Emphysema Father    Hypertension Other    Breast cancer Neg Hx    the patient's father died at the age of 16 from lung cancer in the setting of tobacco abuse. [name] mother died at the age of 14 following a stroke. The patient had one brother, who is severely retarded  and died from pneumonia at the age of 33. There were no sisters. There is no history of blood problems or cancer in the family to the patient's knowledge   GYNECOLOGIC HISTORY:  No LMP recorded. Patient is postmenopausal. Menarche age 36. The patient is GX P0. She stopped having periods in 1978-07-22 and took hormone replacement for almost 20 years.   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 Her husband died in 07-22-91 from a myocardial infarction.  She tells me she has no family.  She [place] by herself, [place], with one cat for company.    ADVANCED DIRECTIVES: Her healthcare power of attorney is Cam Cava who can be reached at [phone] (cell), or [phone] (home).  Also if Soyla Duverney had an  acute problem she would want us  to contact her neighbor and third cousin Mattie Sora at [phone]    HEALTH MAINTENANCE: Social History   Tobacco Use   Smoking status: Never   Smokeless tobacco: Never  Vaping Use   Vaping status: Never Used  Substance Use Topics   Alcohol use: No    Alcohol/week: 0.0 standard drinks of alcohol   Drug use: No     Colonoscopy: 07/21/2012  PAP: 07/22/11  Bone density: 11/2018, -2.4  Lipid panel:  Allergies  Allergen Reactions   Acetaminophen  Rash    REACTION: high dose - tremor Per pt - rash on chest and heart palpitations   Privigen  [Immune Globulin  (Human)] Other (See Comments)    Pt had a rxn; see hypersensitivity note from 01/15/22 at 1259.    Alendronate Sodium     Stomach upset.    Codeine      May interfere with heart condition     Diflunisal     Stomach pain.     Doxycycline      n/v   Flexeril [Cyclobenzaprine]     Abdominal pain.     Iodinated Contrast Media Other (See Comments)    Syncope.    Lisinopril  Other (See Comments)   Nabumetone      Abdominal pain; dark, tarry stool.     Other Other (See Comments)   Oxybutynin     Pt does not remember.     Raloxifene     Unknown.     Sulfa Antibiotics Nausea Only    Syncope.     Symbicort  [Budesonide -Formoterol  Fumarate]     "Jittery-ness."    Tramadol Hcl     Numbness.     Tramadol Hcl Itching    Numbness.     Diclofenac Palpitations    Dark, tarry stool.     Latex Rash    Current Outpatient Medications  Medication Sig Dispense Refill   Carboxymethylcellulose Sodium (REFRESH LIQUIGEL OP) Apply to eye.     carvedilol  (COREG ) 12.5 MG tablet Take 1 tablet (12.5 mg total) by mouth 2 (two) times daily. 180 tablet 3   Cholecalciferol (VITAMIN D3) 50 MCG (2000 UT) capsule Take 1 capsule by mouth daily.     cimetidine  (TAGAMET ) 200 MG tablet Take 200 mg by mouth daily.     cromolyn  (NASALCROM ) 5.2 MG/ACT nasal spray Place 1 spray into both nostrils 4 (four) times daily. 26 mL 5    fluticasone  (FLONASE ) 50 MCG/ACT nasal spray PLACE 2 SPRAYS INTO BOTH NOSTRILS DAILY. 16 g 7   FLUZONE HIGH-DOSE 0.5 ML injection      Glucosamine-Chondroitin 1500-1200 MG/30ML LIQD Take 1 tablet by mouth daily.  losartan  (COZAAR ) 25 MG tablet Take 1 tablet (25 mg total) by mouth daily. 90 tablet 3   ondansetron  (ZOFRAN ) 4 MG tablet 1 tablet Orally Every 8-12 hours as needed for nausea for 30 days     pantoprazole  (PROTONIX ) 40 MG tablet Take 40 mg by mouth daily.     Povidone (IVIZIA DRY EYES OP) Apply to eye.     sucralfate (CARAFATE) 1 g tablet Take 1 g by mouth daily as needed.      SYNTHROID  25 MCG tablet TAKE 1 TABLET (25 MCG TOTAL) BY MOUTH DAILY BEFORE BREAKFAST. 90 tablet 1   tiZANidine (ZANAFLEX) 2 MG tablet Take 2 mg by mouth at bedtime as needed.     No current facility-administered medications for this visit.    OBJECTIVE: white woman who appears stated age  Vitals:   07/06/23 1536  BP: (!) 152/68  Pulse: 68  Resp: 16  Temp: 98.3 F (36.8 C)  SpO2: 98%     Body mass index is 23.14 kg/m.    ECOG FS:2 - Symptomatic, <50% confined to bed  Physical Exam Constitutional:      Appearance: Normal appearance.  Cardiovascular:     Rate and Rhythm: Normal rate and regular rhythm.     Pulses: Normal pulses.     Heart sounds: Normal heart sounds.  Musculoskeletal:     Cervical back: Normal range of motion and neck supple. No rigidity.     Comments: Severe arthritis of hands  Lymphadenopathy:     Cervical: No cervical adenopathy.  Neurological:     Mental Status: She is alert.      LAB RESULTS:  CMP     Component Value Date/Time   NA 135 12/22/2022 1242   NA 136 11/01/2022 1433   NA 136 11/04/2016 1115   K 4.3 12/22/2022 1242   K 4.8 11/04/2016 1115   CL 99 12/22/2022 1242   CO2 31 12/22/2022 1242   CO2 27 11/04/2016 1115   GLUCOSE 104 (H) 12/22/2022 1242   GLUCOSE 95 11/04/2016 1115   BUN 22 12/22/2022 1242   BUN 21 11/01/2022 1433   BUN 16.7  11/04/2016 1115   CREATININE 0.99 12/22/2022 1242   CREATININE 0.8 11/04/2016 1115   CALCIUM 8.9 12/22/2022 1242   CALCIUM 9.0 11/04/2016 1115   PROT 7.0 12/22/2022 1242   PROT 7.0 05/21/2020 1357   PROT 6.6 11/04/2016 1115   ALBUMIN 3.8 12/22/2022 1242   ALBUMIN 4.0 05/21/2020 1357   ALBUMIN 3.5 11/04/2016 1115   AST 19 12/22/2022 1242   AST 23 11/04/2016 1115   ALT 15 12/22/2022 1242   ALT 18 11/04/2016 1115   ALKPHOS 77 12/22/2022 1242   ALKPHOS 82 11/04/2016 1115   BILITOT 0.4 12/22/2022 1242   BILITOT 0.40 11/04/2016 1115   GFRNONAA 57 (L) 12/22/2022 1242   GFRNONAA 69 10/02/2013 1234   GFRAA 74 01/23/2020 0822   GFRAA 80 10/02/2013 1234    I No results found for: "SPEP", "UPEP"  Lab Results  Component Value Date   WBC 9.4 07/06/2023   NEUTROABS 4.1 07/06/2023   HGB 11.6 (L) 07/06/2023   HCT 34.9 (L) 07/06/2023   MCV 92.8 07/06/2023   PLT 287 07/06/2023      Chemistry      Component Value Date/Time   NA 135 12/22/2022 1242   NA 136 11/01/2022 1433   NA 136 11/04/2016 1115   K 4.3 12/22/2022 1242   K 4.8 11/04/2016 1115  CL 99 12/22/2022 1242   CO2 31 12/22/2022 1242   CO2 27 11/04/2016 1115   BUN 22 12/22/2022 1242   BUN 21 11/01/2022 1433   BUN 16.7 11/04/2016 1115   CREATININE 0.99 12/22/2022 1242   CREATININE 0.8 11/04/2016 1115   GLU 89 04/08/2016 0000      Component Value Date/Time   CALCIUM 8.9 12/22/2022 1242   CALCIUM 9.0 11/04/2016 1115   ALKPHOS 77 12/22/2022 1242   ALKPHOS 82 11/04/2016 1115   AST 19 12/22/2022 1242   AST 23 11/04/2016 1115   ALT 15 12/22/2022 1242   ALT 18 11/04/2016 1115   BILITOT 0.4 12/22/2022 1242   BILITOT 0.40 11/04/2016 1115       No results found for: "LABCA2"  No components found for: "LABCA125"  No results for input(s): "INR" in the last 168 hours.  Urinalysis    Component Value Date/Time   COLORURINE LT. YELLOW 06/08/2011 0830   APPEARANCEUR Clear 05/14/2020 1519   LABSPEC 1.010 06/08/2011  0830   PHURINE 6.5 06/08/2011 0830   GLUCOSEU Negative 05/14/2020 1519   GLUCOSEU NEGATIVE 06/08/2011 0830   HGBUR NEGATIVE 06/08/2011 0830   BILIRUBINUR Negative 05/14/2020 1519   KETONESUR NEGATIVE 06/08/2011 0830   PROTEINUR Negative 05/14/2020 1519   PROTEINUR NEGATIVE 04/19/2008 1216   UROBILINOGEN 0.2 10/23/2018 1047   UROBILINOGEN 0.2 06/08/2011 0830   NITRITE Negative 05/14/2020 1519   NITRITE NEGATIVE 06/08/2011 0830   LEUKOCYTESUR Negative 05/14/2020 1519   STUDIES: No results found.  ASSESSMENT:   83 y.o. 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  No evidence of cytopenias related to the MGUS.  No hypercalcemia or AKI noted on her last labs. 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 In case of an infection, IVIG supplementation should be considered  (a) received IVIG October 2016  (b) repeated IVIG October 2018  (c) repeated November 2019 and November 2020  (d) Evusheld May 2022   PLAN:  She is here for follow up of IgM MGUS and hypogammaglobulinemia. Since her last visit here, she had a few health issues including chest pain which was worked up and deemed to be noncardiac.  She also had worsening dyspepsia, had a visit with gastroenterology, now takes as needed Zofran  and had an ultrasound done, pending results. She denies any fevers, drenching night sweats, unintentional loss of weight, persistent bone pain today. On physical exam, she appears well, no palpable lymphadenopathy or hepatosplenomegaly, lungs clear to auscultation, no lower extremity edema CBC from today shows no evidence of cytopenias.  Rest of the labs are pending.  Review of labs from May with  overall stable MGUS.  At this time we have discussed that there is no indication for treatment.  She can continue annual IVIG during the winters which was originally prescribed by Dr. 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  She is scheduled for this tomorrow.  She can return to clinic in May 2024 with repeat labs.  Total time spent: 30 minutes  *Total Encounter Time as defined by the Centers for Medicare and Medicaid Services includes, in addition to the face-to-face time of a patient visit (documented in the  note above) non-face-to-face time: obtaining and reviewing outside history, ordering and reviewing medications, tests or procedures, care coordination (communications with other health care professionals or caregivers) and documentation in the medical record.

## 2023-07-07 ENCOUNTER — Encounter: Payer: Self-pay | Admitting: Oncology

## 2023-07-07 LAB — KAPPA/LAMBDA LIGHT CHAINS
Kappa free light chain: 18.4 mg/L (ref 3.3–19.4)
Kappa, lambda light chain ratio: 5.11 — ABNORMAL HIGH (ref 0.26–1.65)
Lambda free light chains: 3.6 mg/L — ABNORMAL LOW (ref 5.7–26.3)

## 2023-07-08 LAB — MULTIPLE MYELOMA PANEL, SERUM
Albumin SerPl Elph-Mcnc: 3.3 g/dL (ref 2.9–4.4)
Albumin/Glob SerPl: 1.1 (ref 0.7–1.7)
Alpha 1: 0.2 g/dL (ref 0.0–0.4)
Alpha2 Glob SerPl Elph-Mcnc: 0.6 g/dL (ref 0.4–1.0)
B-Globulin SerPl Elph-Mcnc: 1.9 g/dL — ABNORMAL HIGH (ref 0.7–1.3)
Gamma Glob SerPl Elph-Mcnc: 0.3 g/dL — ABNORMAL LOW (ref 0.4–1.8)
Globulin, Total: 3.1 g/dL (ref 2.2–3.9)
IgA: 11 mg/dL — ABNORMAL LOW (ref 64–422)
IgG (Immunoglobin G), Serum: 367 mg/dL — ABNORMAL LOW (ref 586–1602)
IgM (Immunoglobulin M), Srm: 2076 mg/dL — ABNORMAL HIGH (ref 26–217)
M Protein SerPl Elph-Mcnc: 1.2 g/dL — ABNORMAL HIGH
Total Protein ELP: 6.4 g/dL (ref 6.0–8.5)

## 2023-07-26 ENCOUNTER — Telehealth: Payer: Self-pay | Admitting: *Deleted

## 2023-07-27 ENCOUNTER — Telehealth: Payer: Self-pay | Admitting: *Deleted

## 2023-07-27 NOTE — Telephone Encounter (Signed)
 This RN returned call to pt per her request for lab review- reviewed readings with noted stability as well as mailed copy to pt per her request.

## 2023-08-16 ENCOUNTER — Encounter: Payer: Self-pay | Admitting: Oncology

## 2023-08-16 NOTE — Telephone Encounter (Signed)
 No entry

## 2023-08-18 ENCOUNTER — Ambulatory Visit: Payer: Self-pay

## 2023-08-18 NOTE — Telephone Encounter (Signed)
 FYI Only or Action Required?: Action required by provider: Medication request for an antibiotic for her sore throat.  Patient was last seen in primary care on 04/12/2023 by Chandra Toribio POUR, MD.  Called Nurse Triage reporting Sore Throat.  Symptoms began 1-2 days ago.  Symptoms are: gradually worsening.  Triage Disposition: See Physician Within 24 Hours  Patient/caregiver understands and will follow disposition?: No, wishes to speak with PCP       Copied from CRM [id number]. Topic: Clinical - Red Word Triage >> Aug 18, 2023  9:42 AM Robinson H wrote: [facility] that prompted transfer to Nurse Triage: Throat is hurting really bad, hurts to swallow, pressure in cheeks, congestion    Reason for Disposition  SEVERE throat pain (e.g., excruciating)  Answer Assessment - Initial Assessment Questions Patient advised there are no appointments in the office and was advised to go to urgent care. Patient declined stating she would prefer if Dr. Chandra would treat her with an antibiotic for her symptoms. Please advise.       1. ONSET: When did the throat start hurting? (Hours or days ago)      1-2 days ago 2. SEVERITY: How bad is the sore throat? (Scale 1-10; mild, moderate or severe)     Moderate  3. STREP EXPOSURE: Has there been any exposure to strep within the past week? If Yes, ask: What type of contact occurred?      No 4.  VIRAL SYMPTOMS: Are there any symptoms of a cold, such as a runny nose, cough, hoarse voice or red eyes?      Nasal congestion  5. FEVER: Do you have a fever? If Yes, ask: What is your temperature, how was it measured, and when did it start?     No 6. PUS ON THE TONSILS: Is there pus on the tonsils in the back of your throat?     No 7. OTHER SYMPTOMS: Do you have any other symptoms? (e.g., difficulty breathing, headache, rash)     Sinus pressure, congestion, pain with swallowing  Protocols used: Sore Throat-A-AH

## 2023-08-19 ENCOUNTER — Encounter: Payer: Self-pay | Admitting: *Deleted

## 2023-08-19 ENCOUNTER — Encounter: Payer: Self-pay | Admitting: Family Medicine

## 2023-08-19 ENCOUNTER — Ambulatory Visit (INDEPENDENT_AMBULATORY_CARE_PROVIDER_SITE_OTHER): Admitting: Family Medicine

## 2023-08-19 VITALS — BP 134/71 | HR 66 | Ht 64.0 in | Wt 131.1 lb

## 2023-08-19 DIAGNOSIS — J029 Acute pharyngitis, unspecified: Secondary | ICD-10-CM

## 2023-08-19 DIAGNOSIS — M545 Low back pain, unspecified: Secondary | ICD-10-CM

## 2023-08-19 DIAGNOSIS — J069 Acute upper respiratory infection, unspecified: Secondary | ICD-10-CM | POA: Insufficient documentation

## 2023-08-19 LAB — POCT RAPID STREP A (OFFICE): Rapid Strep A Screen: NEGATIVE

## 2023-08-19 MED ORDER — AMOXICILLIN-POT CLAVULANATE 875-125 MG PO TABS: 1.0000 | ORAL_TABLET | Freq: Two times a day (BID) | ORAL | 0 refills | Status: AC

## 2023-08-19 NOTE — Assessment & Plan Note (Signed)
 Reports chronic bilateral leg swelling and numbness in feet, causing difficulty with driving and concerns about fall risk. History of lumbar fusion and degenerative disc disease. Has not seen an orthopedic or neurosurgeon recently for this issue. Currently sees Dr. Daldorf for hip bursitis. - Advised to schedule an appointment with orthopedics (Dr. Daldorf) to discuss back symptoms and management options

## 2023-08-19 NOTE — Telephone Encounter (Signed)
 Copied from CRM [id number]. Topic: Clinical - Red Word Triage >> Aug 19, 2023  8:22 AM Kimberly Merritt wrote: Red Word that prompted transfer to Nurse Triage: Patient is experiencing a severe sore throat, cough, chills, and congestion. The patient said she cannot go the [facility] due to the amount of the co pay This encounter was created in error - please disregard.

## 2023-08-19 NOTE — Patient Instructions (Signed)
 It was nice to see you today,  We addressed the following topics today: -I have sent in Augmentin  for 7 days.  Take it twice a day. - If you not getting better after finishing your antibiotics let us  know - To further discuss your back pain in leg symptoms, the orthopedist who you see would be the best person to talk to.  You can schedule an appointment with them separate from your regular visits to discuss your neuropathy.  Have a great day,  Rolan Slain, MD

## 2023-08-19 NOTE — Progress Notes (Signed)
 Established Patient Office Visit  Subjective   Patient ID: Kimberly Merritt, female    DOB: 1940-04-08  Age: 83 y.o. MRN: [id number]  Chief Complaint  Patient presents with   Cough   Sore Throat    HPI  Subjective - Presents with a 3-day history of cough with phlegm, congestion, and a sore throat with odynophagia. - Experienced chills this morning. - Reports a dull headache and sinus pressure. - Denies known sick contacts. - Denies nausea, vomiting, diarrhea, abdominal pain, or body aches.  - Also reports chronic leg swelling and numbness in the feet. - Concerns about fall risk due to these symptoms. - Reports difficulty feeling the gas pedal when driving.  Medications Using saline gargles, nasal irrigations, Claritin D 12 hour, and menthol lozenges (Ricola) for symptomatic relief of current illness. Allergies to doxycycline  and sulfa drugs. No issues with prior use of azithromycin  or Augmentin .  PMH, PSH, FH, Social Hx PSH: Back surgery in 2006 and 2010. PMH: Degenerative disc disease at T12, C5, and C6. History of bursitis in the right hip. Narrowing of the spinal column noted on prior imaging. Weak immune system. Social Hx: Lives alone with no family nearby.  ROS HEENT: Sore throat, odynophagia, dull headache, sinus pressure. Denies green or yellow sputum. Constitutional: Chills. Denies fever. GI: Denies nausea, vomiting, diarrhea, abdominal pain. MSK: Chronic leg swelling and numbness in feet. Denies body aches.   The ASCVD Risk score (Arnett DK, et al., 2019) failed to calculate for the following reasons:   The 2019 ASCVD risk score is only valid for ages 24 to 55  There are no preventive care reminders to display for this patient.    Objective:     BP 134/71   Pulse 66   Ht 5' 4 (1.626 m)   Wt 131 lb 1.9 oz (59.5 kg)   SpO2 98%   BMI 22.51 kg/m    Physical Exam Gen: alert, oriented Heent: mild general erythema of the posteiror oropharynx.   Cv:  rrr Pulm: lctab. No resp distress   Results for orders placed or performed in visit on 08/19/23  POCT rapid strep A  Result Value Ref Range   Rapid Strep A Screen Negative Negative        Assessment & Plan:   Upper respiratory tract infection, unspecified type Assessment & Plan: Presents with a 3-day history of cough, congestion, and sore throat. Reports chills, headache, and sinus pressure. Objective findings include an erythematous pharynx. Negative rapid strep test. - Prescribed Augmentin  twice daily for 7 days, sent to [facility]. - Advised to continue symptomatic treatment with lozenges (e.g., Cepacol, Ricola). - Counseled on COVID testing; declined. - Instructed to message or call if not improving after completing the antibiotic course. - Follow up scheduled for September.   Acute sore throat -     POCT rapid strep A  Low back pain potentially associated with radiculopathy Assessment & Plan: Reports chronic bilateral leg swelling and numbness in feet, causing difficulty with driving and concerns about fall risk. History of lumbar fusion and degenerative disc disease. Has not seen an orthopedic or neurosurgeon recently for this issue. Currently sees Dr. Daldorf for hip bursitis. - Advised to schedule an appointment with orthopedics (Dr. Daldorf) to discuss back symptoms and management options   Other orders -     Amoxicillin -Pot Clavulanate; Take 1 tablet by mouth 2 (two) times daily for 7 days.  Dispense: 14 tablet; Refill: 0     Return  for appt already on file.    [name], MD

## 2023-08-19 NOTE — Telephone Encounter (Signed)
 Patient returned call to get appt. None available until 08/25/23. Reports she can not afford to go to UC for $90 . Reports she still has sore throat, cough, chills this am. SOB with exertion at times. Hx of heart issues and can not take tylneol for sx. Recommended warm liquids , hard candy, cough drops for sore throat. Recommended UC again due to SOB with exertion. No chest pain reported. Patient reports she lives 5 minutes from office. Requesting if she can get a call back if she can get appt or not. Please advise if medication can be prescribed. CAL notified and no available appt.

## 2023-08-19 NOTE — Telephone Encounter (Signed)
 Appt was scheduled.

## 2023-08-19 NOTE — Assessment & Plan Note (Signed)
 Presents with a 3-day history of cough, congestion, and sore throat. Reports chills, headache, and sinus pressure. Objective findings include an erythematous pharynx. Negative rapid strep test. - Prescribed Augmentin  twice daily for 7 days, sent to [facility]. - Advised to continue symptomatic treatment with lozenges (e.g., Cepacol, Ricola). - Counseled on COVID testing; declined. - Instructed to message or call if not improving after completing the antibiotic course. - Follow up scheduled for September.

## 2023-09-21 DIAGNOSIS — H02833 Dermatochalasis of right eye, unspecified eyelid: Secondary | ICD-10-CM | POA: Diagnosis not present

## 2023-09-21 DIAGNOSIS — H16102 Unspecified superficial keratitis, left eye: Secondary | ICD-10-CM | POA: Diagnosis not present

## 2023-09-21 DIAGNOSIS — H04123 Dry eye syndrome of bilateral lacrimal glands: Secondary | ICD-10-CM | POA: Diagnosis not present

## 2023-09-21 DIAGNOSIS — H353132 Nonexudative age-related macular degeneration, bilateral, intermediate dry stage: Secondary | ICD-10-CM | POA: Diagnosis not present

## 2023-09-21 DIAGNOSIS — H43811 Vitreous degeneration, right eye: Secondary | ICD-10-CM | POA: Diagnosis not present

## 2023-09-21 DIAGNOSIS — H02836 Dermatochalasis of left eye, unspecified eyelid: Secondary | ICD-10-CM | POA: Diagnosis not present

## 2023-09-21 DIAGNOSIS — H35363 Drusen (degenerative) of macula, bilateral: Secondary | ICD-10-CM | POA: Diagnosis not present

## 2023-09-22 ENCOUNTER — Other Ambulatory Visit: Payer: Self-pay | Admitting: Hematology and Oncology

## 2023-09-22 DIAGNOSIS — Z1231 Encounter for screening mammogram for malignant neoplasm of breast: Secondary | ICD-10-CM

## 2023-10-13 ENCOUNTER — Encounter: Payer: Self-pay | Admitting: Family Medicine

## 2023-10-13 ENCOUNTER — Ambulatory Visit (INDEPENDENT_AMBULATORY_CARE_PROVIDER_SITE_OTHER): Admitting: Family Medicine

## 2023-10-13 VITALS — BP 113/72 | HR 64 | Ht 64.0 in | Wt 132.4 lb

## 2023-10-13 DIAGNOSIS — I10 Essential (primary) hypertension: Secondary | ICD-10-CM

## 2023-10-13 DIAGNOSIS — E039 Hypothyroidism, unspecified: Secondary | ICD-10-CM

## 2023-10-13 DIAGNOSIS — J309 Allergic rhinitis, unspecified: Secondary | ICD-10-CM

## 2023-10-13 DIAGNOSIS — M1990 Unspecified osteoarthritis, unspecified site: Secondary | ICD-10-CM | POA: Insufficient documentation

## 2023-10-13 DIAGNOSIS — K219 Gastro-esophageal reflux disease without esophagitis: Secondary | ICD-10-CM | POA: Diagnosis not present

## 2023-10-13 DIAGNOSIS — R7309 Other abnormal glucose: Secondary | ICD-10-CM | POA: Diagnosis not present

## 2023-10-13 DIAGNOSIS — I2510 Atherosclerotic heart disease of native coronary artery without angina pectoris: Secondary | ICD-10-CM | POA: Diagnosis not present

## 2023-10-13 NOTE — Progress Notes (Unsigned)
 Established Patient Office Visit  Subjective   Patient ID: Kimberly Merritt, female    DOB: Apr 01, 1940  Age: 83 y.o. MRN: [id number]  Chief Complaint  Patient presents with   Medical Management of Chronic Issues    HPI  Subjective - Allergies: Reports seasonal allergies. Symptoms are present year-round but can be worse at certain times. - GERD: Follows with Kimberly Merritt for GERD. Symptoms are controlled. Symptoms are worse in the summer with increased fruit intake.  Patient wants to know if we are comfortable managing her reflux medication. - Arthritis: Reports ongoing issues with arthritis. Reports significant joint pain, particularly in the neck and shoulders, and with changes in barometric pressure. Unable to tolerate Tylenol  (causes rash) or other pain medications due to a history of heart failure.  Medications Flonase , Allegra in the morning, Claritin (sometimes alternates with Zyrtec), cromolyn  nasal spray later in the day for allergies. Tagamet  twice a day (OTC) and Protonix  in the evening (prescription) for GERD. Takes thyroid  medication, no issues reported.  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 PSH: Carpal tunnel release on both hands ([id number]), hammertoe surgery with joint fusion (date unspecified, while living in [place]). FH: Not mentioned. Social Hx: No carpets in the house.  ROS Constitutional: Denies fever, chills. ENT: Reports allergic rhinitis symptoms. Respiratory: Denies cough, shortness of breath. Resolved recent respiratory infection. Cardiovascular: Denies chest pain. Musculoskeletal: Reports joint pain, primarily in the hand knuckles, neck, and shoulders. Denies significant pain in wrists, elbows, ankles, or feet joints, though reports one ankle has had swelling in the past. Hip  pain has improved. Neurological: Numbness down the foot has resolved post-injection.   The ASCVD Risk score (Arnett DK, et al., 2019) failed to calculate for the following reasons:   The 2019 ASCVD risk score is only valid for ages 28 to 65  Health Maintenance Due  Topic Date Due   Influenza Vaccine  09/02/2023   Medicare Annual Wellness (AWV)  10/28/2023      Objective:     BP 113/72   Pulse 64   Ht 5' 4 (1.626 m)   Wt 132 lb 6.4 oz (60.1 kg)   SpO2 97%   BMI 22.73 kg/m  {Vitals History (Optional):23777}  Physical Exam General: Alert, oriented CV: Regular rate and rhythm Pulmonary: Clear bilaterally Extremities: Heberden's and Bouchard's nodes on the hands bilaterally.  Ulnar deviation of the fingers bilaterally    No results found for any visits on 10/13/23.      Assessment & Plan:   Allergic rhinitis, unspecified seasonality, unspecified trigger Assessment & Plan: Allergies: Stable on current regimen. - Continue Flonase , Allegra, and cromolyn  nasal spray. May alternate Claritin and Zyrtec as needed.   Gastroesophageal reflux disease, unspecified whether esophagitis present Assessment & Plan: Stable and controlled on current medications. Will take over management from specialist. - Continue Tagamet  BID OTC. - Continue Protonix  in the evening.   Arthritis Assessment & Plan:  Multiple joint pains, history of mixed osteoarthritis and rheumatoid arthritis. Previous trial of methotrexate for two years without improvement. Unable to tolerate NSAIDs or Tylenol . Saw orthopedics on 09/21/2023 for hip pain, received an injection into the hip bursa which resolved associated foot numbness. Completed a prednisone  dose pack. Orthopedics referred to rheumatology for further management. Scheduled to see Kimberly Merritt (rheumatologist) in January. - Ordered labs: RF,  anti-CCP, ESR, CRP, along with routine cholesterol panel, CMP. - Patient will complete labs prior to rheumatology  appointment. - Follow up after rheumatology and orthopedics appointments.  Orders: -     CYCLIC CITRUL PEPTIDE ANTIBODY, IGG/IGA; Future -     Rheumatoid factor; Future -     Sedimentation rate; Future -     C-reactive protein; Future  Hypertension, unspecified type  Hypothyroidism (acquired) -     TSH; Future  Elevated hemoglobin A1c -     Hemoglobin A1c; Future  Atherosclerotic cardiovascular disease -     Lipid panel; Future     Return in about 6 months (around 04/11/2024) for HTN, hld, tsh.    Kimberly MARLA Slain, MD

## 2023-10-13 NOTE — Patient Instructions (Signed)
 It was nice to see you today,  We addressed the following topics today: -I have no problem prescribing your medications for reflux. - We will get some lab work for inflammatory arthritis and routine labs for thyroid  kidney and liver function tests.   Have a great day,  Rolan Slain, MD

## 2023-10-13 NOTE — Assessment & Plan Note (Signed)
 Multiple joint pains, history of mixed osteoarthritis and rheumatoid arthritis. Previous trial of methotrexate for two years without improvement. Unable to tolerate NSAIDs or Tylenol . Saw orthopedics on 09/21/2023 for hip pain, received an injection into the hip bursa which resolved associated foot numbness. Completed a prednisone  dose pack. Orthopedics referred to rheumatology for further management. Scheduled to see Dr. Jeannetta (rheumatologist) in January. - Ordered labs: RF, anti-CCP, ESR, CRP, along with routine cholesterol panel, CMP. - Patient will complete labs prior to rheumatology appointment. - Follow up after rheumatology and orthopedics appointments.

## 2023-10-13 NOTE — Assessment & Plan Note (Signed)
 Stable and controlled on current medications. Will take over management from specialist. - Continue Tagamet  BID OTC. - Continue Protonix  in the evening.

## 2023-10-13 NOTE — Assessment & Plan Note (Signed)
 Allergies: Stable on current regimen. - Continue Flonase , Allegra, and cromolyn  nasal spray. May alternate Claritin and Zyrtec as needed.

## 2023-10-13 NOTE — Progress Notes (Unsigned)
 Cardiology Office Note:   Date:  10/14/2023  ID:  Kimberly Merritt, DOB 04-05-1940, MRN [id number] PCP: Chandra Toribio POUR, MD  [facility] Providers Cardiologist:  Lynwood Schilling, MD {  History of Present Illness:   Kimberly Merritt is a 83 y.o. female with the above past medical history including nonobstructive CAD, NICM with recovered EF, mild mitral valve regurgitation, chronic venous insufficiency, varicose veins, hypertension, hyperlipidemia, hypothyroidism, and GERD.   She underwent cardiac catheterization in 2015 in the setting of new cardiomyopathy (echo showed EF 30 to 35%), atypical chest pain, revealed moderate disease in the L PDA (not amenable to PCI), otherwise, minimal disease throughout, moderate to severely reduced LV function, EF approximately 30 to 35%.  Echo in 2018 showed EF improved to 55 to 60%, G1 DD.  Nuclear stress test in 2021 showed normal perfusion, no evidence of ischemia or infarction.  Repeat echocardiogram in 2021 showed EF improved to 60 to 65%, no RWMA, G1 DD, normal RV function, mild MR.  In the office on 11/01/2022 and she was noted to have left-sided chest pressure, not associated with exertion, dyspnea on exertion.  Repeat echocardiogram in 11/2022 showed EF 60 to 65%, normal LV function, no RWMA, G1 DD, low normal RV systolic function, normal PASP, mild mitral valve regurgitation. Lexiscan  Myoview  in 11/2022 was negative for ischemia.  It was noted that her symptoms were felt to be musculoskeletal in nature.    She presents today for follow-up.  She has had no acute complaints except she is having some slight leg pain and swelling.  She thinks this is a venous problem that she has had vein procedures before.  She would like to see vascular surgery.  She is not having any new cardiac complaints.  She gets around.  She has a cane for support but she does not use it.  She actually admits to a fall.  She is not having any chest pressure, neck or arm discomfort.   She had some symptoms last year evaluated as above but she has had no new symptoms since then.  She has had no new PND or orthopnea.  She has had no weight gain and her chronic lower extremity swelling is somewhat stable.  She might have a little bit more.  ROS: As stated in the HPI and negative for all other systems.  Studies Reviewed:    EKG:   EKG Interpretation Date/Time:  Friday October 14 2023 11:20:39 EDT Ventricular Rate:  66 PR Interval:  148 QRS Duration:  88 QT Interval:  416 QTC Calculation: 436 R Axis:   81  Text Interpretation: Normal sinus rhythm Nonspecific ST abnormality When compared with ECG of 01-Nov-2022 13:39, No significant change since last tracing Confirmed by Schilling Lynwood ([id number]) on 10/14/2023 11:41:08 AM    Risk Assessment/Calculations:              Physical Exam:   VS:  BP 134/76   Pulse 66   Ht 5' 2 (1.575 m)   Wt 131 lb 12.8 oz (59.8 kg)   SpO2 95%   BMI 24.11 kg/m    Wt Readings from Last 3 Encounters:  10/14/23 131 lb 12.8 oz (59.8 kg)  10/13/23 132 lb 6.4 oz (60.1 kg)  08/19/23 131 lb 1.9 oz (59.5 kg)     GEN: Well nourished, well developed in no acute distress NECK: No JVD; No carotid bruits CARDIAC: RRR, no murmurs, rubs, gallops RESPIRATORY:  Clear to auscultation without rales, wheezing  or rhonchi  ABDOMEN: Soft, non-tender, non-distended EXTREMITIES:  Mild leg edema; No deformity   ASSESSMENT AND PLAN:   CAD: The patient has no new sypmtoms.  No further cardiovascular testing is indicated.  We will continue with aggressive risk reduction and meds as listed.she has had no new symptoms since a perfusion study last year.   NICM: Prior EF 30 to 35%.  Echocardiogram in 11/2022 showed EF 60 to 65%.  She seems to be euvolemic.  No change in therapy.  Mitral valve regurgitation: Mild on most recent echo.  I will follow this clinically.  Chronic venous insufficiency/varicose veins:   She has had laser ablation of the left great  saphenous vein in 2008.  She requested [facility] follow-up.     Hypertension: The blood pressure is controlled.  No change in therapy.    Follow up with me in one year.   Signed, Lynwood Schilling, MD

## 2023-10-14 ENCOUNTER — Ambulatory Visit: Attending: Cardiology | Admitting: Cardiology

## 2023-10-14 ENCOUNTER — Encounter: Payer: Self-pay | Admitting: Cardiology

## 2023-10-14 VITALS — BP 134/76 | HR 66 | Ht 62.0 in | Wt 131.8 lb

## 2023-10-14 DIAGNOSIS — E785 Hyperlipidemia, unspecified: Secondary | ICD-10-CM | POA: Diagnosis not present

## 2023-10-14 DIAGNOSIS — I2510 Atherosclerotic heart disease of native coronary artery without angina pectoris: Secondary | ICD-10-CM

## 2023-10-14 DIAGNOSIS — I428 Other cardiomyopathies: Secondary | ICD-10-CM | POA: Diagnosis not present

## 2023-10-14 DIAGNOSIS — I8393 Asymptomatic varicose veins of bilateral lower extremities: Secondary | ICD-10-CM

## 2023-10-14 DIAGNOSIS — I340 Nonrheumatic mitral (valve) insufficiency: Secondary | ICD-10-CM | POA: Diagnosis not present

## 2023-10-14 DIAGNOSIS — I10 Essential (primary) hypertension: Secondary | ICD-10-CM | POA: Diagnosis not present

## 2023-10-14 NOTE — Patient Instructions (Signed)
 Medication Instructions:  Your physician recommends that you continue on your current medications as directed. Please refer to the Current Medication list given to you today.  *If you need a refill on your cardiac medications before your next appointment, please call your pharmacy*  Lab Work: NONE If you have labs (blood work) drawn today and your tests are completely normal, you will receive your results only by: MyChart Message (if you have MyChart) OR A paper copy in the mail If you have any lab test that is abnormal or we need to change your treatment, we will call you to review the results.  Testing/Procedures: NONE  Follow-Up: At [facility], you and your health needs are our priority.  As part of our continuing mission to provide you with exceptional heart care, our providers are all part of one team.  This team includes your primary Cardiologist (physician) and Advanced Practice Providers or APPs (Physician Assistants and Nurse Practitioners) who all work together to provide you with the care you need, when you need it.  Your next appointment:   1 year  Provider:   [name], MD   We recommend signing up for the patient portal called MyChart.  Sign up information is provided on this After Visit Summary.  MyChart is used to connect with patients for Virtual Visits (Telemedicine).  Patients are able to view lab/test results, encounter notes, upcoming appointments, etc.  Non-urgent messages can be sent to your provider as well.   To learn more about what you can do with MyChart, go to [email or web address].   Other Instructions You have been referred to [facility]. Someone will reach out to you to make an appointment.

## 2023-10-28 ENCOUNTER — Ambulatory Visit

## 2023-11-03 ENCOUNTER — Ambulatory Visit: Payer: Medicare HMO

## 2023-11-03 DIAGNOSIS — Z0000 Encounter for general adult medical examination without abnormal findings: Secondary | ICD-10-CM

## 2023-11-03 NOTE — Patient Instructions (Signed)
 Kimberly Merritt,  Thank you for taking the time for your Medicare Wellness Visit. I appreciate your continued commitment to your health goals. Please review the care plan we discussed, and feel free to reach out if I can assist you further.  Medicare recommends these wellness visits once per year to help you and your care team stay ahead of potential health issues. These visits are designed to focus on prevention, allowing your provider to concentrate on managing your acute and chronic conditions during your regular appointments.  Please note that Annual Wellness Visits do not include a physical exam. Some assessments may be limited, especially if the visit was conducted virtually. If needed, we may recommend a separate in-person follow-up with your provider.  Ongoing Care Seeing your primary care provider every 3 to 6 months helps us  monitor your health and provide consistent, personalized care.   Referrals If a referral was made during today's visit and you haven't received any updates within two weeks, please contact the referred provider directly to check on the status.  Recommended Screenings:  Health Maintenance  Topic Date Due   Flu Shot  09/02/2023   Medicare Annual Wellness Visit  11/02/2024   DTaP/Tdap/Td vaccine (3 - Td or Tdap) 04/13/2027   Pneumococcal Vaccine for age over 25  Completed   DEXA scan (bone density measurement)  Completed   HPV Vaccine  Aged Out   Meningitis B Vaccine  Aged Out   COVID-19 Vaccine  Discontinued   Zoster (Shingles) Vaccine  Discontinued       11/03/2023   10:55 AM  Advanced Directives  Does Patient Have a Medical Advance Directive? Yes  Type of Estate agent of [place];Living will  Copy of Healthcare Power of Attorney in Chart? Yes - validated most recent copy scanned in chart (See row information)   Advance Care Planning is important because it: Ensures you receive medical care that aligns with your values, goals,  and preferences. Provides guidance to your family and loved ones, reducing the emotional burden of decision-making during critical moments.  Vision: Annual vision screenings are recommended for early detection of glaucoma, cataracts, and diabetic retinopathy. These exams can also reveal signs of chronic conditions such as diabetes and high blood pressure.  Dental: Annual dental screenings help detect early signs of oral cancer, gum disease, and other conditions linked to overall health, including heart disease and diabetes.  Please see the attached documents for additional preventive care recommendations.

## 2023-11-03 NOTE — Progress Notes (Signed)
 Subjective:   Kimberly Merritt is a 83 y.o. who presents for a Medicare Wellness preventive visit.  As a reminder, Annual Wellness Visits don't include a physical exam, and some assessments may be limited, especially if this visit is performed virtually. We may recommend an in-person follow-up visit with your provider if needed.  Visit Complete: Virtual I connected with  Kimberly Merritt on 11/03/23 by a audio enabled telemedicine application and verified that I am speaking with the correct person using two identifiers.  Patient Location: Home  Provider Location: Home Office  I discussed the limitations of evaluation and management by telemedicine. The patient expressed understanding and agreed to proceed.  Vital Signs: Because this visit was a virtual/telehealth visit, some criteria may be missing or patient reported. Any vitals not documented were not able to be obtained and vitals that have been documented are patient reported.  VideoError- Librarian, academic were attempted between this provider and patient, however failed, due to patient having technical difficulties OR patient did not have access to video capability.  We continued and completed visit with audio only.   Persons Participating in Visit: Patient.  AWV Questionnaire: No: Patient Medicare AWV questionnaire was not completed prior to this visit.  Cardiac Risk Factors include: advanced age (>36men, >10 women);dyslipidemia;hypertension     Objective:    Today's Vitals   11/03/23 1046  PainSc: 2    There is no height or weight on file to calculate BMI.     11/03/2023   10:55 AM 10/28/2022    4:06 PM 06/24/2022    4:17 PM 09/08/2021   10:30 AM 12/04/2020    1:01 PM 07/22/2020   10:30 AM 12/16/2016    1:59 PM  Advanced Directives  Does Patient Have a Medical Advance Directive? Yes Yes No Yes Yes No No   Type of Estate agent of [place];Living will Healthcare Power  of [place];Living will  Healthcare Power of [place];Living will Living will    Does patient want to make changes to medical advance directive?  No - Patient declined  No - Patient declined     Copy of Healthcare Power of Attorney in Chart? Yes - validated most recent copy scanned in chart (See row information) Yes - validated most recent copy scanned in chart (See row information)  Yes - validated most recent copy scanned in chart (See row information)     Would patient like information on creating a medical advance directive?      No - Patient declined      Data saved with a previous flowsheet row definition    Current Medications (verified) Outpatient Encounter Medications as of 11/03/2023  Medication Sig   Carboxymethylcellulose Sodium (REFRESH LIQUIGEL OP) Apply to eye.   carvedilol  (COREG ) 12.5 MG tablet Take 1 tablet (12.5 mg total) by mouth 2 (two) times daily.   Cholecalciferol (VITAMIN D3) 50 MCG (2000 UT) capsule Take 1 capsule by mouth daily.   cimetidine  (TAGAMET ) 200 MG tablet Take 200 mg by mouth daily.   cromolyn  (NASALCROM ) 5.2 MG/ACT nasal spray Place 1 spray into both nostrils 4 (four) times daily.   fluticasone  (FLONASE ) 50 MCG/ACT nasal spray PLACE 2 SPRAYS INTO BOTH NOSTRILS DAILY.   Glucosamine-Chondroitin 1500-1200 MG/30ML LIQD Take 1 tablet by mouth daily.   losartan  (COZAAR ) 25 MG tablet Take 1 tablet (25 mg total) by mouth daily.   ondansetron  (ZOFRAN ) 4 MG tablet 1 tablet Orally Every 8-12 hours as needed for  nausea for 30 days   pantoprazole  (PROTONIX ) 40 MG tablet Take 40 mg by mouth daily.   Povidone (IVIZIA DRY EYES OP) Apply to eye.   sucralfate (CARAFATE) 1 g tablet Take 1 g by mouth daily as needed.    SYNTHROID  25 MCG tablet TAKE 1 TABLET (25 MCG TOTAL) BY MOUTH DAILY BEFORE BREAKFAST.   tiZANidine (ZANAFLEX) 2 MG tablet Take 2 mg by mouth at bedtime as needed.   FLUZONE HIGH-DOSE 0.5 ML injection  (Patient not taking: Reported on 11/03/2023)   No  facility-administered encounter medications on file as of 11/03/2023.    Allergies (verified) Acetaminophen , Privigen  [immune globulin  (human)], Alendronate sodium, Codeine , Diflunisal, Doxycycline , Flexeril [cyclobenzaprine], Iodinated contrast media, Lisinopril , Nabumetone , Other, Oxybutynin, Raloxifene, Sulfa antibiotics, Symbicort  [budesonide -formoterol  fumarate], Tramadol hcl, Tramadol hcl, Diclofenac, and Latex   History: Past Medical History:  Diagnosis Date   Allergy    CAD (coronary artery disease)    Nonobstructive. LAD 30% stenosis, small PDA of the circumflex 70% stenosis, right coronary artery 40% stenosis. Catheterization September 2015   Chronic cough 10/23/2014   DEPRESSION 08/29/2006   Diverticulosis    GERD 08/29/2006   HEPATIC CYST 08/02/2007   HOARSENESS 04/25/2007   Qualifier: Diagnosis of   By: Garald MD, Aleksei V      Hypertension    HYPOTHYROIDISM 08/29/2006   LIVER HEMANGIOMA 03/03/2010   LOW BACK PAIN 08/29/2006   MONOCLONAL GAMMOPATHY 03/03/2010   MUSCLE SPASM 03/03/2010   Nonischemic cardiomyopathy (HCC)    EF 30%   OSTEOARTHRITIS 08/29/2006   OSTEOPENIA 11/19/2008   PARESTHESIA 03/05/2009   PVC's (premature ventricular contractions)    Scoliosis    VARICOSE VEINS, LOWER EXTREMITIES 08/29/2006   Past Surgical History:  Procedure Laterality Date   ABDOMINAL HYSTERECTOMY  1981   APPENDECTOMY     BACK SURGERY  2010,2006   lumb fusion   BUNIONECTOMY     both   BUNIONECTOMY Right 1985   CARPAL TUNNEL RELEASE     both   CARPAL TUNNEL RELEASE Right 1992   CARPAL TUNNEL RELEASE Left 1992   CATARACT EXTRACTION     both   CATARACT EXTRACTION Right 2008   CATARACT EXTRACTION Left 2008   HAMMER TOE SURGERY Left 1987   HAMMER TOE SURGERY Right 1986   LEFT AND RIGHT HEART CATHETERIZATION WITH CORONARY ANGIOGRAM N/A 10/04/2013   Procedure: LEFT AND RIGHT HEART CATHETERIZATION WITH CORONARY ANGIOGRAM;  Surgeon: Alm LELON Clay, MD;  Location: [facility]  CATH LAB;  Service: Cardiovascular;  Laterality: N/A;   LUMBAR FUSION  2006   LUMBAR FUSION  2010   MOHS SURGERY     right cheek   OOPHORECTOMY  1967   OVARIAN CYST REMOVAL     ROTATOR CUFF REPAIR Right 2013   SHOULDER ARTHROSCOPY  02/09/2011   Procedure: ARTHROSCOPY SHOULDER;  Surgeon: Maude KANDICE Herald, MD;  Location: [facility];  Service: Orthopedics;  Laterality: Right;  right shoulder arthroscopy subacromial decompression with rotator cuff repair   TONSILLECTOMY     Family History  Problem Relation Age of Onset   Stroke Mother    Cancer Father    Emphysema Father    Hypertension Other    Breast cancer Neg Hx    Social History   Socioeconomic History   Marital status: Widowed    Spouse name: Not on file   Number of children: 0   Years of education: Not on file   Highest education level: Not on file  Occupational  History   Occupation: Retired  Tobacco Use   Smoking status: Never   Smokeless tobacco: Never  Vaping Use   Vaping status: Never Used  Substance and Sexual Activity   Alcohol use: No    Alcohol/week: 0.0 standard drinks of alcohol   Drug use: No   Sexual activity: Not Currently  Other Topics Concern   Not on file  Social History Narrative   Lives alone.     Social Drivers of Corporate investment banker Strain: Low Risk  (11/03/2023)   Overall Financial Resource Strain (CARDIA)    Difficulty of Paying Living Expenses: Not hard at all  Food Insecurity: No Food Insecurity (11/03/2023)   Hunger Vital Sign    Worried About Running Out of Food in the Last Year: Never true    Ran Out of Food in the Last Year: Never true  Transportation Needs: No Transportation Needs (11/03/2023)   PRAPARE - Administrator, Civil Service (Medical): No    Lack of Transportation (Non-Medical): No  Physical Activity: Inactive (11/03/2023)   Exercise Vital Sign    Days of Exercise per Week: 0 days    Minutes of Exercise per Session: 0 min  Stress: No  Stress Concern Present (11/03/2023)   [employer] of Occupational Health - Occupational Stress Questionnaire    Feeling of Stress: Not at all  Social Connections: Socially Isolated (11/03/2023)   Social Connection and Isolation Panel    Frequency of Communication with Friends and Family: Twice a week    Frequency of Social Gatherings with Friends and Family: Three times a week    Attends Religious Services: Never    Active Member of Clubs or Organizations: No    Attends Banker Meetings: Never    Marital Status: Widowed    Tobacco Counseling Counseling given: Not Answered    Clinical Intake:  Pre-visit preparation completed: Yes  Pain : 0-10 Pain Score: 2  Pain Type: Chronic pain Pain Location: Hip Pain Orientation: Right Pain Descriptors / Indicators: Aching Pain Onset: More than a month ago Pain Frequency: Constant     Nutritional Risks: None Diabetes: No  Lab Results  Component Value Date   HGBA1C 5.1 01/23/2020   HGBA1C 5.6 10/23/2018   HGBA1C 5.9 (H) 08/16/2017     How often do you need to have someone help you when you read instructions, pamphlets, or other written materials from your doctor or pharmacy?: 1 - Never  Interpreter Needed?: No  Information entered by :: NAllen LPN   Activities of Daily Living     11/03/2023   10:47 AM  In your present state of health, do you have any difficulty performing the following activities:  Hearing? 0  Vision? 0  Difficulty concentrating or making decisions? 1  Comment short term memory  Walking or climbing stairs? 0  Dressing or bathing? 0  Doing errands, shopping? 0  Preparing Food and eating ? N  Using the Toilet? N  In the past six months, have you accidently leaked urine? Y  Comment few times at night  Do you have problems with loss of bowel control? N  Managing your Medications? N  Managing your Finances? N  Housekeeping or managing your Housekeeping? N    Patient Care  Team: Chandra Toribio POUR, MD as PCP - General (Family Medicine) [name] Agent, MD as PCP - Cardiology (Cardiology) Sheril Coy, MD as Attending Physician (Orthopedic Surgery) Rosalie Kitchens, MD (Gastroenterology) Roz Anes, MD as Attending  Physician (Ophthalmology) Alix Charleston, MD as Consulting Physician (Neurosurgery) [name] Agent, MD as Consulting Physician (Cardiology) Junior Emmit Ivanoff, MD as Referring Physician (Dermatology) Watt Rush, MD as Attending Physician (Urology) Frutoso Luz, MD as Referring Physician (Allergy) Harvey Carlin BRAVO, MD (Inactive) as Consulting Physician (Vascular Surgery) Elner Arley LABOR, MD as Consulting Physician (Ophthalmology) Loretha Ash, MD as Consulting Physician (Hematology and Oncology)  I have updated your Care Teams any recent Medical Services you may have received from other providers in the past year.     Assessment:   This is a routine wellness examination for Kimberly Merritt.  Hearing/Vision screen Hearing Screening - Comments:: Denies hearing issues Vision Screening - Comments:: Regular eye exams, [facility] [place], Dr. Elner   Goals Addressed             This Visit's Progress    Patient Stated       11/03/2023, stay alive       Depression Screen     11/03/2023   10:59 AM 10/13/2023    1:37 PM 04/12/2023    1:20 PM 01/12/2023    1:56 PM 11/30/2022    2:45 PM 10/28/2022    4:05 PM 06/24/2022    4:18 PM  PHQ 2/9 Scores  PHQ - 2 Score 0 0 0 0 0 0 1  PHQ- 9 Score 5 0 1 0 1  1    Fall Risk     11/03/2023   10:56 AM 10/28/2022    4:06 PM 06/24/2022    4:18 PM 04/20/2022   10:15 AM 02/19/2022    1:21 PM  Fall Risk   Falls in the past year? 1 0 0 0 0  Comment bent over and lost balance      Number falls in past yr: 0 0 0 0 0  Injury with Fall? 0 0 0  0  Risk for fall due to : Medication side effect No Fall Risks No Fall Risks No Fall Risks No Fall Risks  Follow up Falls prevention discussed;Falls evaluation  completed Falls prevention discussed Falls evaluation completed Falls evaluation completed Falls evaluation completed      Data saved with a previous flowsheet row definition    MEDICARE RISK AT HOME:  Medicare Risk at Home Any stairs in or around the home?: Yes If so, are there any without handrails?: No Home free of loose throw rugs in walkways, pet beds, electrical cords, etc?: Yes Adequate lighting in your home to reduce risk of falls?: Yes Life alert?: No Use of a cane, walker or w/c?: No Grab bars in the bathroom?: No Shower chair or bench in shower?: No Elevated toilet seat or a handicapped toilet?: No  TIMED UP AND GO:  Was the test performed?  No  Cognitive Function: 6CIT completed    07/22/2020   10:31 AM  MMSE - Mini Mental State Exam  Orientation to time 5  Orientation to Place 5  Registration 3  Attention/ Calculation 0  Recall 3  Language- name 2 objects 2  Language- repeat 1  Language- follow 3 step command 3  Language- read & follow direction 1  Write a sentence 1  Copy design 0  Total score 24        11/03/2023   11:01 AM 10/28/2022    4:06 PM 09/08/2021   10:42 AM 05/30/2018    1:32 PM  6CIT Screen  What Year? 0 points 0 points 0 points 0 points  What month? 0 points 0 points  0 points 0 points  What time? 0 points 0 points 0 points 0 points  Count back from 20 0 points 0 points 0 points 0 points  Months in reverse 0 points 0 points 0 points 0 points  Repeat phrase 0 points 0 points 0 points 4 points  Total Score 0 points 0 points 0 points 4 points    Immunizations Immunization History  Administered Date(s) Administered   H1N1 02/13/2008   INFLUENZA, HIGH DOSE SEASONAL PF 10/09/2014, 11/16/2017, 11/09/2018, 11/07/2020, 10/23/2021   Influenza Split 11/23/2011, 10/30/2013, 10/09/2014   Influenza Whole 02/02/2005, 11/10/2007, 11/27/2008, 12/01/2009   Influenza-Unspecified 11/01/2012, 10/30/2013, 11/09/2018, 10/18/2019, 10/28/2022   PFIZER(Purple  Top)SARS-COV-2 Vaccination 03/09/2019, 04/03/2019, 10/30/2019   Pneumococcal Conjugate-13 04/18/2013   Pneumococcal Polysaccharide-23 12/02/2005   Respiratory Syncytial Virus Vaccine,Recomb Aduvanted(Arexvy) 12/28/2021   Td 12/08/2004   Tdap 04/12/2017    Screening Tests Health Maintenance  Topic Date Due   Influenza Vaccine  09/02/2023   Medicare Annual Wellness (AWV)  11/02/2024   DTaP/Tdap/Td (3 - Td or Tdap) 04/13/2027   Pneumococcal Vaccine: 50+ Years  Completed   DEXA SCAN  Completed   HPV VACCINES  Aged Out   Meningococcal B Vaccine  Aged Out   COVID-19 Vaccine  Discontinued   Zoster Vaccines- Shingrix  Discontinued    Health Maintenance Items Addressed: Vaccines Due: flu  Additional Screening:  Vision Screening: Recommended annual ophthalmology exams for early detection of glaucoma and other disorders of the eye. Is the patient up to date with their annual eye exam?  Yes  Who is the provider or what is the name of the office in which the patient attends annual eye exams? [facility]  Dental Screening: Recommended annual dental exams for proper oral hygiene  Community Resource Referral / Chronic Care Management: CRR required this visit?  No   CCM required this visit?  No   Plan:    I have personally reviewed and noted the following in the patient's chart:   Medical and social history Use of alcohol, tobacco or illicit drugs  Current medications and supplements including opioid prescriptions. Patient is not currently taking opioid prescriptions. Functional ability and status Nutritional status Physical activity Advanced directives List of other physicians Hospitalizations, surgeries, and ER visits in previous 12 months Vitals Screenings to include cognitive, depression, and falls Referrals and appointments  In addition, I have reviewed and discussed with patient certain preventive protocols, quality metrics, and best practice recommendations. A  written personalized care plan for preventive services as well as general preventive health recommendations were provided to patient.   Ardella FORBES Dawn, LPN   [id number]/08/[id number]   After Visit Summary: (Pick Up) Due to this being a telephonic visit, with patients personalized plan was offered to patient and patient has requested to Pick up at office.  Notes: Nothing significant to report at this time.

## 2023-11-04 ENCOUNTER — Ambulatory Visit
Admission: RE | Admit: 2023-11-04 | Discharge: 2023-11-04 | Disposition: A | Discharge status: Home / Self Care | Source: Ambulatory Visit | Attending: Hematology and Oncology | Admitting: Hematology and Oncology

## 2023-11-04 DIAGNOSIS — Z1231 Encounter for screening mammogram for malignant neoplasm of breast: Secondary | ICD-10-CM | POA: Diagnosis not present

## 2023-11-08 ENCOUNTER — Other Ambulatory Visit: Payer: Self-pay | Admitting: Cardiology

## 2023-11-08 DIAGNOSIS — I2089 Other forms of angina pectoris: Secondary | ICD-10-CM

## 2023-11-22 ENCOUNTER — Other Ambulatory Visit

## 2023-11-22 DIAGNOSIS — R7309 Other abnormal glucose: Secondary | ICD-10-CM

## 2023-11-22 DIAGNOSIS — I2510 Atherosclerotic heart disease of native coronary artery without angina pectoris: Secondary | ICD-10-CM

## 2023-11-22 DIAGNOSIS — E039 Hypothyroidism, unspecified: Secondary | ICD-10-CM | POA: Diagnosis not present

## 2023-11-22 DIAGNOSIS — M1990 Unspecified osteoarthritis, unspecified site: Secondary | ICD-10-CM | POA: Diagnosis not present

## 2023-11-24 ENCOUNTER — Ambulatory Visit: Payer: Self-pay | Admitting: Family Medicine

## 2023-11-24 LAB — LIPID PANEL
Chol/HDL Ratio: 2.6 ratio (ref 0.0–4.4)
Cholesterol, Total: 167 mg/dL (ref 100–199)
HDL: 64 mg/dL (ref >39)
LDL Chol Calc (NIH): 95 mg/dL (ref 0–99)
Triglycerides: 36 mg/dL (ref 0–149)
VLDL Cholesterol Cal: 8 mg/dL (ref 5–40)

## 2023-11-24 LAB — RHEUMATOID FACTOR: Rheumatoid fact SerPl-aCnc: <10 [IU]/mL (ref <14.0)

## 2023-11-24 LAB — C-REACTIVE PROTEIN: CRP: 1 mg/L (ref 0–10)

## 2023-11-24 LAB — TSH: TSH: 2.46 u[IU]/mL (ref 0.450–4.500)

## 2023-11-24 LAB — SEDIMENTATION RATE: Sed Rate: 29 mm/h (ref 0–40)

## 2023-11-24 LAB — HEMOGLOBIN A1C
Est. average glucose Bld gHb Est-mCnc: 117 mg/dL
Hgb A1c MFr Bld: 5.7 % — ABNORMAL HIGH (ref 4.8–5.6)

## 2023-11-24 LAB — CYCLIC CITRUL PEPTIDE ANTIBODY, IGG/IGA: Cyclic Citrullin Peptide Ab: 6 U (ref 0–19)

## 2023-11-28 ENCOUNTER — Telehealth: Payer: Self-pay

## 2023-11-28 NOTE — Telephone Encounter (Signed)
 Copied from CRM [id number]. Topic: Clinical - Lab/Test Results >> Nov 28, 2023 10:11 AM Dedra B wrote: Reason for CRM: Pt is calling regarding her lab results. She spoke with the nurse last week, but would like clarification on some things. Pls call pt.

## 2023-11-29 ENCOUNTER — Telehealth: Payer: Self-pay

## 2023-11-29 ENCOUNTER — Other Ambulatory Visit: Payer: Self-pay | Admitting: Family Medicine

## 2023-11-29 MED ORDER — OXYCODONE HCL 5 MG PO TABS: 2.5000 mg | ORAL_TABLET | Freq: Four times a day (QID) | ORAL | 0 refills | Status: AC | PRN

## 2023-11-29 NOTE — Telephone Encounter (Signed)
 I spoke with the pt regarding her results and we discussed medications for her joint pain.  She is allergic to acetaminophen  and has been told not to take nsaids in the past d/t her CVD.  Agreed to prescribe her 2.5 - 5 mg of oxycodone as needed for severe pain. Advised her I would send in no more than 10 tablets in a month.

## 2023-11-29 NOTE — Telephone Encounter (Signed)
 Copied from CRM [id number]. Topic: Clinical - Lab/Test Results >> Nov 29, 2023 10:32 AM Kimberly Merritt wrote: Reason for CRM: Patient called in to follow up on her results from her recent Labs completed 11/24/2023  The patient would like a call to go over the following results with her care team:   1. Thyroid   2. Cholesterol  Levels     **PCP/PCP Team Please Advise**

## 2023-12-05 DIAGNOSIS — R110 Nausea: Secondary | ICD-10-CM | POA: Diagnosis not present

## 2023-12-05 DIAGNOSIS — K449 Diaphragmatic hernia without obstruction or gangrene: Secondary | ICD-10-CM | POA: Diagnosis not present

## 2023-12-05 DIAGNOSIS — R1310 Dysphagia, unspecified: Secondary | ICD-10-CM | POA: Diagnosis not present

## 2023-12-06 ENCOUNTER — Other Ambulatory Visit: Payer: Self-pay | Admitting: Gastroenterology

## 2023-12-06 DIAGNOSIS — R1310 Dysphagia, unspecified: Secondary | ICD-10-CM

## 2023-12-12 ENCOUNTER — Telehealth: Payer: Self-pay

## 2023-12-12 NOTE — Telephone Encounter (Signed)
 S/w regarding prior auth status of IVIG.  Confirmed with pharmacy that medication has been authorized. Patient aware and verbalized an understanding of the information.

## 2023-12-19 ENCOUNTER — Other Ambulatory Visit: Payer: Self-pay | Admitting: *Deleted

## 2023-12-19 DIAGNOSIS — D801 Nonfamilial hypogammaglobulinemia: Secondary | ICD-10-CM

## 2023-12-20 ENCOUNTER — Inpatient Hospital Stay: Attending: Hematology and Oncology

## 2023-12-20 ENCOUNTER — Inpatient Hospital Stay

## 2023-12-20 ENCOUNTER — Telehealth: Payer: Self-pay

## 2023-12-20 VITALS — BP 145/84 | HR 81 | Temp 99.3°F | Resp 16

## 2023-12-20 DIAGNOSIS — D472 Monoclonal gammopathy: Secondary | ICD-10-CM | POA: Insufficient documentation

## 2023-12-20 DIAGNOSIS — Z79899 Other long term (current) drug therapy: Secondary | ICD-10-CM | POA: Diagnosis present

## 2023-12-20 DIAGNOSIS — D801 Nonfamilial hypogammaglobulinemia: Secondary | ICD-10-CM | POA: Diagnosis present

## 2023-12-20 LAB — CBC WITH DIFFERENTIAL (CANCER CENTER ONLY)
Abs Immature Granulocytes: 0.02 K/uL (ref 0.00–0.07)
Basophils Absolute: 0.1 K/uL (ref 0.0–0.1)
Basophils Relative: 1 %
Eosinophils Absolute: 0.1 K/uL (ref 0.0–0.5)
Eosinophils Relative: 2 %
HCT: 35.9 % — ABNORMAL LOW (ref 36.0–46.0)
Hemoglobin: 12 g/dL (ref 12.0–15.0)
Immature Granulocytes: 0 %
Lymphocytes Relative: 40 %
Lymphs Abs: 2.7 K/uL (ref 0.7–4.0)
MCH: 30.7 pg (ref 26.0–34.0)
MCHC: 33.4 g/dL (ref 30.0–36.0)
MCV: 91.8 fL (ref 80.0–100.0)
Monocytes Absolute: 1 K/uL (ref 0.1–1.0)
Monocytes Relative: 15 %
Neutro Abs: 2.8 K/uL (ref 1.7–7.7)
Neutrophils Relative %: 42 %
Platelet Count: 326 K/uL (ref 150–400)
RBC: 3.91 MIL/uL (ref 3.87–5.11)
RDW: 11.9 % (ref 11.5–15.5)
WBC Count: 6.7 K/uL (ref 4.0–10.5)
nRBC: 0 % (ref 0.0–0.2)

## 2023-12-20 LAB — CMP (CANCER CENTER ONLY)
ALT: 13 U/L (ref 0–44)
AST: 16 U/L (ref 15–41)
Albumin: 3.8 g/dL (ref 3.5–5.0)
Alkaline Phosphatase: 74 U/L (ref 38–126)
Anion gap: 6 (ref 5–15)
BUN: 18 mg/dL (ref 8–23)
CO2: 27 mmol/L (ref 22–32)
Calcium: 9 mg/dL (ref 8.9–10.3)
Chloride: 101 mmol/L (ref 98–111)
Creatinine: 0.75 mg/dL (ref 0.44–1.00)
GFR, Estimated: >60 mL/min (ref >60)
Glucose, Bld: 101 mg/dL — ABNORMAL HIGH (ref 70–99)
Potassium: 4.7 mmol/L (ref 3.5–5.1)
Sodium: 134 mmol/L — ABNORMAL LOW (ref 135–145)
Total Bilirubin: 0.4 mg/dL (ref 0.0–1.2)
Total Protein: 6.9 g/dL (ref 6.5–8.1)

## 2023-12-20 MED ORDER — DIPHENHYDRAMINE HCL 12.5 MG/5ML PO ELIX
12.5000 mg | ORAL_SOLUTION | Freq: Once | ORAL | Status: AC
  Administered 2023-12-20: 12.5 mg via ORAL
  Filled 2023-12-20: qty 5

## 2023-12-20 MED ORDER — FAMOTIDINE IN NACL 20-0.9 MG/50ML-% IV SOLN: 20.0000 mg | Freq: Once | INTRAVENOUS | Status: DC

## 2023-12-20 MED ORDER — DEXTROSE 5 % IV SOLN: INTRAVENOUS | Status: DC

## 2023-12-20 MED ORDER — IMMUNE GLOBULIN (HUMAN) 20 GM/200ML IV SOLN
60.0000 g | Freq: Once | INTRAVENOUS | Status: AC
  Administered 2023-12-20: 60 g via INTRAVENOUS
  Filled 2023-12-20: qty 200

## 2023-12-20 NOTE — Progress Notes (Signed)
 Patient declined post-IVIG observation.  VSS at discharge.  Ambulated to lobby.

## 2023-12-20 NOTE — Progress Notes (Signed)
 Pepcid  IV 20mg  ordered for today and added as premed for subsequent Privigen  infusions per Dr. Loretha d/t pt feeling off, restless with slightly elevated BP.  Dariusz Brase, PharmD, MBA

## 2023-12-20 NOTE — Progress Notes (Signed)
@  1232 stopped infusion, pt complained was not feeling right, jittery, pt stated like restless leg but all over, bp elevated at 157/74, pt also stated slight headache. Contacted dr loretha and was advised to restart at last titartion which was 120mg /kg/hr 72ml hr, pt tolerated that rate with no issues. Ryan H, rn bumped up pt to 84 ml/hr and pt is tolerating well with no complaints, vss, will continue infusion

## 2023-12-20 NOTE — Patient Instructions (Signed)

## 2023-12-20 NOTE — Telephone Encounter (Signed)
 Copied from CRM #[id number]. Topic: Clinical - Medical Advice >> Dec 20, 2023 10:08 AM Winona SAUNDERS wrote: FYI- Dr. Janett calling to inform Dr. Chandra that the Pt went in on 11/03 for a 1 year follow up but complained about trouble swallowing so the provider ordered a Barium swallow which the pt denied to do at the moment.

## 2023-12-21 LAB — KAPPA/LAMBDA LIGHT CHAINS
Kappa free light chain: 17.6 mg/L (ref 3.3–19.4)
Kappa, lambda light chain ratio: 4.51 — ABNORMAL HIGH (ref 0.26–1.65)
Lambda free light chains: 3.9 mg/L — ABNORMAL LOW (ref 5.7–26.3)

## 2023-12-21 NOTE — Progress Notes (Signed)
 Per Ronal RAMAN., RN, pt was unable to tolerate higher Privigen  infusion rates: max tolerated rate was 68mL/hr and future appt times should be 510 minutes. Scheduling communication added and administration instructions updated with this information per scheduling, nursing, and Dr. Walter request.  Eaven Schwager, PharmD, MBA

## 2023-12-23 LAB — MULTIPLE MYELOMA PANEL, SERUM
Albumin SerPl Elph-Mcnc: 3.4 g/dL (ref 2.9–4.4)
Albumin/Glob SerPl: 1.1 (ref 0.7–1.7)
Alpha 1: 0.2 g/dL (ref 0.0–0.4)
Alpha2 Glob SerPl Elph-Mcnc: 0.7 g/dL (ref 0.4–1.0)
B-Globulin SerPl Elph-Mcnc: 2.1 g/dL — ABNORMAL HIGH (ref 0.7–1.3)
Gamma Glob SerPl Elph-Mcnc: 0.2 g/dL — ABNORMAL LOW (ref 0.4–1.8)
Globulin, Total: 3.3 g/dL (ref 2.2–3.9)
IgA: 11 mg/dL — ABNORMAL LOW (ref 64–422)
IgG (Immunoglobin G), Serum: 307 mg/dL — ABNORMAL LOW (ref 586–1602)
IgM (Immunoglobulin M), Srm: 2153 mg/dL — ABNORMAL HIGH (ref 26–217)
M Protein SerPl Elph-Mcnc: 1.5 g/dL — ABNORMAL HIGH
Total Protein ELP: 6.7 g/dL (ref 6.0–8.5)

## 2024-01-02 DIAGNOSIS — L821 Other seborrheic keratosis: Secondary | ICD-10-CM | POA: Diagnosis not present

## 2024-01-02 DIAGNOSIS — Z85828 Personal history of other malignant neoplasm of skin: Secondary | ICD-10-CM | POA: Diagnosis not present

## 2024-01-02 DIAGNOSIS — Z08 Encounter for follow-up examination after completed treatment for malignant neoplasm: Secondary | ICD-10-CM | POA: Diagnosis not present

## 2024-01-02 DIAGNOSIS — L814 Other melanin hyperpigmentation: Secondary | ICD-10-CM | POA: Diagnosis not present

## 2024-01-02 DIAGNOSIS — L2989 Other pruritus: Secondary | ICD-10-CM | POA: Diagnosis not present

## 2024-01-02 DIAGNOSIS — D1801 Hemangioma of skin and subcutaneous tissue: Secondary | ICD-10-CM | POA: Diagnosis not present

## 2024-01-02 DIAGNOSIS — Z872 Personal history of diseases of the skin and subcutaneous tissue: Secondary | ICD-10-CM | POA: Diagnosis not present

## 2024-01-02 DIAGNOSIS — Z789 Other specified health status: Secondary | ICD-10-CM | POA: Diagnosis not present

## 2024-01-02 DIAGNOSIS — L820 Inflamed seborrheic keratosis: Secondary | ICD-10-CM | POA: Diagnosis not present

## 2024-01-02 DIAGNOSIS — L538 Other specified erythematous conditions: Secondary | ICD-10-CM | POA: Diagnosis not present

## 2024-01-03 ENCOUNTER — Other Ambulatory Visit: Payer: Self-pay | Admitting: Family Medicine

## 2024-01-03 DIAGNOSIS — E039 Hypothyroidism, unspecified: Secondary | ICD-10-CM

## 2024-01-06 ENCOUNTER — Ambulatory Visit: Admitting: Hematology and Oncology

## 2024-01-09 ENCOUNTER — Telehealth: Payer: Self-pay | Admitting: Hematology and Oncology

## 2024-01-09 ENCOUNTER — Other Ambulatory Visit: Payer: Self-pay

## 2024-01-09 DIAGNOSIS — I8390 Asymptomatic varicose veins of unspecified lower extremity: Secondary | ICD-10-CM

## 2024-01-09 NOTE — Telephone Encounter (Signed)
 I returned patient's call to reschedule 01/10/2024 appointment to 01/19/2024 due to potential inclement weather. Patient aware of date/time change.

## 2024-01-10 ENCOUNTER — Inpatient Hospital Stay: Admitting: Hematology and Oncology

## 2024-01-19 ENCOUNTER — Inpatient Hospital Stay: Attending: Hematology and Oncology | Admitting: Hematology and Oncology

## 2024-01-19 VITALS — BP 146/72 | HR 71 | Temp 97.6°F | Resp 16 | Wt 133.0 lb

## 2024-01-19 DIAGNOSIS — Z79899 Other long term (current) drug therapy: Secondary | ICD-10-CM | POA: Insufficient documentation

## 2024-01-19 DIAGNOSIS — D801 Nonfamilial hypogammaglobulinemia: Secondary | ICD-10-CM | POA: Diagnosis not present

## 2024-01-19 DIAGNOSIS — D472 Monoclonal gammopathy: Secondary | ICD-10-CM | POA: Diagnosis not present

## 2024-01-19 NOTE — Progress Notes (Signed)
 [facility]  [phone] [phone]     ID: Kimberly Merritt DOB: Jan 26, 1941  MR#: [id number]  RDW#:[id number]  Patient Care Team: Chandra Toribio POUR, MD as PCP - General (Family Medicine) Lynwood Schilling, MD as PCP - Cardiology (Cardiology) Sheril Coy, MD as Attending Physician (Orthopedic Surgery) Rosalie Kitchens, MD (Gastroenterology) Roz Anes, MD as Attending Physician (Ophthalmology) Alix Charleston, MD as Consulting Physician (Neurosurgery) Lynwood Schilling, MD as Consulting Physician (Cardiology) Junior Emmit Ivanoff, MD as Referring Physician (Dermatology) Watt Rush, MD as Attending Physician (Urology) Frutoso Luz, MD as Referring Physician (Allergy) Harvey Carlin BRAVO, MD (Inactive) as Consulting Physician (Vascular Surgery) Elner Arley LABOR, MD as Consulting Physician (Ophthalmology) Loretha Ash, MD as Consulting Physician (Hematology and Oncology) OTHER MD: Lynwood Schilling MD, Praveen mannam MD  CHIEF COMPLAINT: IgM M-GUS/ low-grade lymphoproliferative process  CURRENT TREATMENT: IVIG as needed; Evusheild  INTERVAL HISTORY  Kimberly Merritt today for 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  Discussed the use of AI scribe software for clinical note transcription with the patient, who gave verbal consent to proceed.  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  She reports acute onset of cough and sinus pressure since last night, associated with significant discomfort and increased coughing. She feels generally unwell and endorses shortness of breath with exertion, requiring her to stop and rest when performing activities  such as taking out the trash. She denies current fever or chills. She had a similar episode in July that responded to Augmentin . She attributes a recent cold and week-long illness after her November IVIG infusion to possible exposure in the clinic waiting area.  Laboratory studies from November for IgM MGUS showed no significant changes. She denies drenching night sweats, significant weight changes, or new lymphadenopathy. Appetite is generally good except during acute illness. She receives annual IVIG infusions, with the most recent in November, during which she experienced a mild infusion reaction that resolved with rate reduction.  She endorses persistent fatigue, describing herself as feeling tired all the time and able to be active for only a couple of hours before needing to rest, consistent with prior visits. Her recent hemoglobin was 12 g/dL. Her thyroid  was checked in October. She attributes some fatigue to arthritis pain and allergic rhinitis. She reports poor sleep, waking every two to two and a half hours throughout the night, and denies snoring.     COVID 19 VACCINATION STATUS: fully vaccinated [employer]), with booster 10/2019   HISTORY PRESENT ILLNESS::  from the earlier summary note:  Kimberly Merritt is a 83 y/o [place] woman I evaluated originally in October of 2004 for a monoclonal gammopathy. At that time she had a white cell count of 7.2, hemoglobin 12.8, MCV 94.5, and platelets 298,000. Protein workup showed an IgM kappa paraprotein of 0.6 g, with a total IgG slightly depressed at 556, but a normal IgA at 82. Because an IgM paraprotein can be associated with a low grade lymphoproliferative process (Waldenstrm's macroglobulinemia) [name] underwent bone marrow biopsy 11/27/2002, which showed ([id number]) a normal cellular marrow with trilineage hematopoiesis and 5% plasma cells. The peripheral blood film showed no significant abnormalities and on the biopsy para-spicular lymphoid  aggregates were not noted. Overall there was no evidence of Waldenstrm's  and a diagnosis of IgM MGUS was made.  The patient was followed with observation alone through May of 2009, with no evidence of progression in her IgM kappa clone. As of 06/03/2007 her total IgM spike was 0.83, her IgG was 490 and her IgA was 36. The patient was released from followup here at that time.  More recently, as the 10 year anniversary of her original bone marrow biopsy past, Dr. Ralph of referred the patient back for reassessment and possible bone marrow rebiopsy. Kimberly was scheduled here for lab work 09/25/2013. However when she had her blood drawn she complained of some shortness of breath and her pulse was checked. It was 33. The patient was referred to the emergency room where she was evaluated by Dr. [name] who felt the patient's true heart rate was higher (she has ectopic beats which generated a week pulse and may have been missed) since in the emergency room EKG her rate was 98 with normal sinus rhythm. Troponins were negative. BNP was slightly elevated.  Dr. [name] set the patient up for an echocardiogram 09/27/2013 and this showed an ejection fraction of 30-35%. There was diffuse hypokinesis. She underwent right and left heart catheterization 10/04/2013 which showed no significant obstructions. The study did confirm an ejection fraction of roughly 35% with global hypokinesis. She is followed by Dr. [name] for her new diagnosis of non-ischemic cardiomyopathy.  Her subsequent history is as detailed below   PAST MEDICAL HISTORY: Past Medical History:  Diagnosis Date   Allergy    CAD (coronary artery disease)    Nonobstructive. LAD 30% stenosis, small PDA of the circumflex 70% stenosis, right coronary artery 40% stenosis. Catheterization September 2015   Chronic cough 10/23/2014   DEPRESSION 08/29/2006   Diverticulosis    GERD 08/29/2006   HEPATIC CYST 08/02/2007   HOARSENESS 04/25/2007    Qualifier: Diagnosis of   By: Garald MD, Aleksei V      Hypertension    HYPOTHYROIDISM 08/29/2006   LIVER HEMANGIOMA 03/03/2010   LOW BACK PAIN 08/29/2006   MONOCLONAL GAMMOPATHY 03/03/2010   MUSCLE SPASM 03/03/2010   Nonischemic cardiomyopathy (HCC)    EF 30%   OSTEOARTHRITIS 08/29/2006   OSTEOPENIA 11/19/2008   PARESTHESIA 03/05/2009   PVC's (premature ventricular contractions)    Scoliosis    VARICOSE VEINS, LOWER EXTREMITIES 08/29/2006    PAST SURGICAL HISTORY: Past Surgical History:  Procedure Laterality Date   ABDOMINAL HYSTERECTOMY  1981   APPENDECTOMY     BACK SURGERY  2010,2006   lumb fusion   BUNIONECTOMY     both   BUNIONECTOMY Right 1985   CARPAL TUNNEL RELEASE     both   CARPAL TUNNEL RELEASE Right 1992   CARPAL TUNNEL RELEASE Left 1992   CATARACT EXTRACTION     both   CATARACT EXTRACTION Right 2008   CATARACT EXTRACTION Left 2008   HAMMER TOE SURGERY Left 1987   HAMMER TOE SURGERY Right 1986   LEFT AND RIGHT HEART CATHETERIZATION WITH CORONARY ANGIOGRAM N/A 10/04/2013   Procedure: LEFT AND RIGHT HEART CATHETERIZATION WITH CORONARY ANGIOGRAM;  Surgeon: Alm LELON Clay, MD;  Location: [facility] CATH LAB;  Service: Cardiovascular;  Laterality: N/A;   LUMBAR FUSION  2006   LUMBAR FUSION  2010   MOHS SURGERY     right cheek   OOPHORECTOMY  1967   OVARIAN CYST REMOVAL     ROTATOR CUFF REPAIR Right 2013   SHOULDER ARTHROSCOPY  02/09/2011   Procedure: ARTHROSCOPY SHOULDER;  Surgeon:  Maude KANDICE Herald, MD;  Location: [facility];  Service: Orthopedics;  Laterality: Right;  right shoulder arthroscopy subacromial decompression with rotator cuff repair   TONSILLECTOMY      FAMILY HISTORY Family History  Problem Relation Age of Onset   Stroke Mother    Cancer Father    Emphysema Father    Hypertension Other    Breast cancer Neg Hx    the patient's father died at the age of 8 from lung cancer in the setting of tobacco abuse. [name] mother  died at the age of 33 following a stroke. The patient had one brother, who is severely retarded and died from pneumonia at the age of 89. There were no sisters. There is no history of blood problems or cancer in the family to the patient's knowledge   GYNECOLOGIC HISTORY:  No LMP recorded. Patient is postmenopausal. Menarche age 33. The patient is GX P0. She stopped having periods in 01-31-1979 and took hormone replacement for almost 20 years.   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 Her husband died in 01-31-1992 from a myocardial infarction.  She tells me she has no family.  She [place] by herself, [place], with one cat for company.    ADVANCED DIRECTIVES: Her healthcare power of attorney is Roselie Forts who can be reached at [phone] (cell), or [phone] (home).  Also if Roselie had an acute problem she would want us  to contact her neighbor and third cousin Nancyann Molt at [phone]    HEALTH MAINTENANCE: Social History   Tobacco Use   Smoking status: Never   Smokeless tobacco: Never  Vaping Use   Vaping status: Never Used  Substance Use Topics   Alcohol use: No    Alcohol/week: 0.0 standard drinks of alcohol   Drug use: No     Colonoscopy: 2013/01/30  PAP: 01/31/12  Bone density: 11/2018, -2.4  Lipid panel:  Allergies  Allergen Reactions   Acetaminophen  Rash    REACTION: high dose - tremor Per pt - rash on chest and heart palpitations   Privigen  [Immune Globulin  (Human)] Other (See Comments)    Pt had a rxn; see hypersensitivity note from 01/15/22 at 1259.    Alendronate Sodium     Stomach upset.    Codeine      May interfere with heart condition     Diflunisal     Stomach pain.     Doxycycline      n/v   Flexeril [Cyclobenzaprine]     Abdominal pain.     Iodinated Contrast Media Other (See Comments)    Syncope.    Lisinopril  Other (See Comments)   Nabumetone      Abdominal pain; dark, tarry stool.     Other Other (See  Comments)   Oxybutynin     Pt does not remember.     Raloxifene     Unknown.     Sulfa Antibiotics Nausea Only    Syncope.     Symbicort  [Budesonide -Formoterol  Fumarate]     Jittery-ness.    Tramadol Hcl     Numbness.     Tramadol Hcl Itching    Numbness.     Diclofenac Palpitations    Dark, tarry stool.     Latex Rash    Current Outpatient Medications  Medication Sig Dispense Refill   Carboxymethylcellulose Sodium (REFRESH LIQUIGEL OP) Apply to eye.     carvedilol  (COREG ) 12.5 MG tablet TAKE  1 TABLET BY MOUTH 2 TIMES DAILY. 180 tablet 3   Cholecalciferol (VITAMIN D3) 50 MCG (2000 UT) capsule Take 1 capsule by mouth daily.     cimetidine  (TAGAMET ) 200 MG tablet Take 200 mg by mouth daily.     cromolyn  (NASALCROM ) 5.2 MG/ACT nasal spray Place 1 spray into both nostrils 4 (four) times daily. 26 mL 5   fluticasone  (FLONASE ) 50 MCG/ACT nasal spray PLACE 2 SPRAYS INTO BOTH NOSTRILS DAILY. 16 g 7   FLUZONE HIGH-DOSE 0.5 ML injection  (Patient not taking: Reported on 11/03/2023)     Glucosamine-Chondroitin 1500-1200 MG/30ML LIQD Take 1 tablet by mouth daily.     losartan  (COZAAR ) 25 MG tablet TAKE 1 TABLET BY MOUTH DAILY. 90 tablet 3   ondansetron  (ZOFRAN ) 4 MG tablet 1 tablet Orally Every 8-12 hours as needed for nausea for 30 days     oxyCODONE  (OXY IR/ROXICODONE ) 5 MG immediate release tablet Take 0.5-1 tablets (2.5-5 mg total) by mouth every 6 (six) hours as needed for severe pain (pain score 7-10) or breakthrough pain. 10 tablet 0   pantoprazole  (PROTONIX ) 40 MG tablet Take 40 mg by mouth daily.     Povidone (IVIZIA DRY EYES OP) Apply to eye.     sucralfate (CARAFATE) 1 g tablet Take 1 g by mouth daily as needed.      SYNTHROID  25 MCG tablet TAKE 1 TABLET (25 MCG TOTAL) BY MOUTH DAILY BEFORE BREAKFAST. 90 tablet 1   tiZANidine (ZANAFLEX) 2 MG tablet Take 2 mg by mouth at bedtime as needed.     No current facility-administered medications for this visit.    OBJECTIVE: white  woman who appears stated age  Vitals:   01/19/24 1300  BP: (!) 146/72  Pulse: 71  Resp: 16  Temp: 97.6 F (36.4 C)  SpO2: 100%     Body mass index is 24.33 kg/m.    ECOG FS:2 - Symptomatic, <50% confined to bed  Physical Exam Constitutional:      Appearance: Normal appearance.  Cardiovascular:     Rate and Rhythm: Normal rate and regular rhythm.     Pulses: Normal pulses.     Heart sounds: Normal heart sounds.  Musculoskeletal:     Cervical back: Normal range of motion and neck supple. No rigidity.     Comments: Severe arthritis of hands  Lymphadenopathy:     Cervical: No cervical adenopathy.  Neurological:     Mental Status: She is alert.      LAB RESULTS:  CMP     Component Value Date/Time   NA 134 (L) 12/20/2023 0828   NA 136 11/01/2022 1433   NA 136 11/04/2016 1115   K 4.7 12/20/2023 0828   K 4.8 11/04/2016 1115   CL 101 12/20/2023 0828   CO2 27 12/20/2023 0828   CO2 27 11/04/2016 1115   GLUCOSE 101 (H) 12/20/2023 0828   GLUCOSE 95 11/04/2016 1115   BUN 18 12/20/2023 0828   BUN 21 11/01/2022 1433   BUN 16.7 11/04/2016 1115   CREATININE 0.75 12/20/2023 0828   CREATININE 0.8 11/04/2016 1115   CALCIUM 9.0 12/20/2023 0828   CALCIUM 9.0 11/04/2016 1115   PROT 6.9 12/20/2023 0828   PROT 7.0 05/21/2020 1357   PROT 6.6 11/04/2016 1115   ALBUMIN 3.8 12/20/2023 0828   ALBUMIN 4.0 05/21/2020 1357   ALBUMIN 3.5 11/04/2016 1115   AST 16 12/20/2023 0828   AST 23 11/04/2016 1115   ALT 13 12/20/2023 0828  ALT 18 11/04/2016 1115   ALKPHOS 74 12/20/2023 0828   ALKPHOS 82 11/04/2016 1115   BILITOT 0.4 12/20/2023 0828   BILITOT 0.40 11/04/2016 1115   GFRNONAA >60 12/20/2023 0828   GFRNONAA 69 10/02/2013 1234   GFRAA 74 01/23/2020 0822   GFRAA 80 10/02/2013 1234    I No results found for: SPEP, UPEP  Lab Results  Component Value Date   WBC 6.7 12/20/2023   NEUTROABS 2.8 12/20/2023   HGB 12.0 12/20/2023   HCT 35.9 (L) 12/20/2023   MCV 91.8  12/20/2023   PLT 326 12/20/2023      Chemistry      Component Value Date/Time   NA 134 (L) 12/20/2023 0828   NA 136 11/01/2022 1433   NA 136 11/04/2016 1115   K 4.7 12/20/2023 0828   K 4.8 11/04/2016 1115   CL 101 12/20/2023 0828   CO2 27 12/20/2023 0828   CO2 27 11/04/2016 1115   BUN 18 12/20/2023 0828   BUN 21 11/01/2022 1433   BUN 16.7 11/04/2016 1115   CREATININE 0.75 12/20/2023 0828   CREATININE 0.8 11/04/2016 1115   GLU 89 04/08/2016 0000      Component Value Date/Time   CALCIUM 9.0 12/20/2023 0828   CALCIUM 9.0 11/04/2016 1115   ALKPHOS 74 12/20/2023 0828   ALKPHOS 82 11/04/2016 1115   AST 16 12/20/2023 0828   AST 23 11/04/2016 1115   ALT 13 12/20/2023 0828   ALT 18 11/04/2016 1115   BILITOT 0.4 12/20/2023 0828   BILITOT 0.40 11/04/2016 1115       No results found for: LABCA2  No components found for: LABCA125  No results for input(s): INR in the last 168 hours.  Urinalysis    Component Value Date/Time   COLORURINE LT. YELLOW 06/08/2011 0830   APPEARANCEUR Clear 05/14/2020 1519   LABSPEC 1.010 06/08/2011 0830   PHURINE 6.5 06/08/2011 0830   GLUCOSEU Negative 05/14/2020 1519   GLUCOSEU NEGATIVE 06/08/2011 0830   HGBUR NEGATIVE 06/08/2011 0830   BILIRUBINUR Negative 05/14/2020 1519   KETONESUR NEGATIVE 06/08/2011 0830   PROTEINUR Negative 05/14/2020 1519   PROTEINUR NEGATIVE 04/19/2008 1216   UROBILINOGEN 0.2 10/23/2018 1047   UROBILINOGEN 0.2 06/08/2011 0830   NITRITE Negative 05/14/2020 1519   NITRITE NEGATIVE 06/08/2011 0830   LEUKOCYTESUR Negative 05/14/2020 1519   STUDIES: No results found.  ASSESSMENT:   83 y.o. [place], [place]  woman with a history of IgM kappa gammopathy of uncertain significance (IgM M-GUS) dating back to October 2004  Ig M MGUS, diagnosed in 2004, last labs from May with the no evidence of hyperviscosity,, mild elevation in M protein but kappa lambda ratio remained stable.  No evidence of cytopenias  related to the MGUS.  No hypercalcemia or AKI noted on her last labs. 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 In case of an infection, IVIG supplementation should be considered  (a) received IVIG October 2016  (b) repeated IVIG October 2018  (c) repeated November 2019 and November 2020  (d) Evusheld May 2022   PLAN:  She is here for follow up of IgM MGUS and hypogammaglobulinemia. Assessment and Plan Assessment & Plan Ig M MGUS and hypogammaglobulinemia. Labs are stable She was started on once a yr IVIG by Dr Layla and she strongly prefers to do this in the winter. IgM MGUS stable with no progression or B symptoms. Recent upper respiratory infection likely due to community exposure. - Reviewed  November laboratory results and confirmed MGUS stability. - Provided printed copy of laboratory results. - Scheduled follow-up in six months for repeat MGUS labs and assessment. - Planned IVIG infusion for October or November next year.  Acute upper respiratory infection Symptoms consistent with acute upper respiratory infection. Previous episodes responded well to Augmentin . - Prescribed Augmentin  twice daily for one week. - Sent prescription to her preferred pharmacy.  Fatigue Chronic fatigue likely multifactorial with normal hemoglobin and thyroid  function. Contributing factors include poor sleep, arthritis, pain, and allergic rhinitis. - Recommended sleep hygiene measures, including avoiding TV and cell phone use in bed and trying a glass of warm milk before bedtime. - Advised follow-up with primary care for possible vitamin D  testing.   Total time spent: 30 minutes  *Total Encounter Time as defined by the Centers for Medicare and Medicaid Services includes, in addition to the face-to-face time of a patient visit (documented in the note above) non-face-to-face time: obtaining and reviewing outside history, ordering and  reviewing medications, tests or procedures, care coordination (communications with other health care professionals or caregivers) and documentation in the medical record.

## 2024-02-10 ENCOUNTER — Encounter

## 2024-02-10 ENCOUNTER — Ambulatory Visit (HOSPITAL_COMMUNITY)

## 2024-02-23 ENCOUNTER — Encounter: Admitting: Internal Medicine

## 2024-02-24 ENCOUNTER — Ambulatory Visit (HOSPITAL_COMMUNITY)
Admission: RE | Admit: 2024-02-24 | Discharge: 2024-02-24 | Disposition: A | Source: Ambulatory Visit | Attending: Vascular Surgery

## 2024-02-24 DIAGNOSIS — I8390 Asymptomatic varicose veins of unspecified lower extremity: Secondary | ICD-10-CM | POA: Diagnosis present

## 2024-02-28 ENCOUNTER — Encounter: Admitting: Internal Medicine

## 2024-02-28 NOTE — Progress Notes (Unsigned)
 "  Office Visit Note  Patient: Kimberly Merritt             Date of Birth: 1940-12-24           MRN: [id number]             PCP: Chandra Toribio POUR, MD Referring: Sheril Coy, MD Visit Date: 02/28/2024 Occupation: Data Unavailable  Subjective:  No chief complaint on file.   History of Present Illness: Kimberly Merritt is a 84 y.o. female ***     Activities of Daily Living:  Patient reports morning stiffness for *** {minute/hour:19697}.   Patient {ACTIONS;DENIES/REPORTS:21021675::Denies} nocturnal pain.  Difficulty dressing/grooming: {ACTIONS;DENIES/REPORTS:21021675::Denies} Difficulty climbing stairs: {ACTIONS;DENIES/REPORTS:21021675::Denies} Difficulty getting out of chair: {ACTIONS;DENIES/REPORTS:21021675::Denies} Difficulty using hands for taps, buttons, cutlery, and/or writing: {ACTIONS;DENIES/REPORTS:21021675::Denies}  No Rheumatology ROS completed.   PMFS History:  Patient Active Problem List   Diagnosis Date Noted   Arthritis 10/13/2023   Upper respiratory tract infection 08/19/2023   Essential tremor 04/13/2023   Chest wall pain 11/30/2022   Viral upper respiratory tract infection 09/04/2021   Mixed rhinitis 08/21/2021   Slow transit constipation 07/30/2021   Living will in place 11/21/2020   Low serum cortisol level 07/29/2020   Mixed stress and urge urinary incontinence 07/23/2020   Mild cognitive impairment 07/23/2020   Chronic fatigue syndrome 01/16/2020   Diaphragmatic hernia 01/16/2020   Stable angina 01/16/2020   Intermediate stage nonexudative age-related macular degeneration of both eyes 01/09/2020   Posterior vitreous detachment of right eye 01/09/2020   Degenerative retinal drusen, both eyes 01/09/2020   Vitreous hemorrhage of left eye (HCC) 01/09/2020   Precordial pain 10/17/2019   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   Estrogen deficiency 02/20/2019   Macular degeneration of left eye 02/20/2019   Osteopenia after  menopause 12/07/2018   h/o foot Bone spur- of the calcium type 12/07/2018   Atherosclerotic cardiovascular disease 12/07/2018   Family history of abdominal aortic aneurysm (AAA)- father, elder 12/07/2018   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   Hyperlipidemia 03/09/2018   Immunocompromised patient 12/01/2017   Chronic pain of right knee-since age 62 per patient 04/12/2017   Vitamin D  insufficiency 04/12/2017   CAD (coronary artery disease) 04/05/2017   Hypertension 04/05/2017   Atherosclerosis of aorta 04/05/2017   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   History of colonic polyps 04/05/2017   Scoliosis 04/05/2017   Ventricular premature depolarization 04/05/2017   Benign colonic polyp- found in 07/2011- told f/up Dr Rosalie 5 yrs 04/05/2017   disabled status- since age 47 due to RA in [place] 04/05/2017   History of hypoglycemia 04/05/2017   Post-menopausal 04/05/2017   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HCC) 11/26/2016   PVC's (premature ventricular contractions) 05/18/2016   Nonischemic cardiomyopathy (HCC) 11/29/2013   Lip lesion 08/21/2013   Diverticulosis of colon without hemorrhage 05/02/2013   Paresthesia 06/20/2012   Sinusitis, acute frontal 02/15/2011   Shoulder pain, right 07/09/2010   LIVER HEMANGIOMA 03/03/2010   MGUS (monoclonal gammopathy of unknown significance) 03/03/2010   MUSCLE SPASM 03/03/2010   Herpes labialis 12/01/2009   Disorder of bone and cartilage 11/19/2008   HEPATIC CYST 08/02/2007   Allergic rhinitis 04/25/2007   Hypothyroidism (acquired) 08/29/2006   VARICOSE VEINS, LOWER EXTREMITIES 08/29/2006   Gastroesophageal reflux disease 08/29/2006   Generalized osteoarthritis 08/29/2006   Low back pain potentially associated with radiculopathy 08/29/2006   Pain in Soft Tissues of Limb 08/29/2006    Past Medical History:  Diagnosis Date   Allergy    CAD (coronary artery disease)  Nonobstructive. LAD 30% stenosis, small PDA of the circumflex 70% stenosis, right coronary artery  40% stenosis. Catheterization September 2015   Chronic cough 10/23/2014   DEPRESSION 08/29/2006   Diverticulosis    GERD 08/29/2006   HEPATIC CYST 08/02/2007   HOARSENESS 04/25/2007   Qualifier: Diagnosis of   By: Plotnikov MD, Aleksei V      Hypertension    HYPOTHYROIDISM 08/29/2006   LIVER HEMANGIOMA 03/03/2010   LOW BACK PAIN 08/29/2006   MONOCLONAL GAMMOPATHY 03/03/2010   MUSCLE SPASM 03/03/2010   Nonischemic cardiomyopathy (HCC)    EF 30%   OSTEOARTHRITIS 08/29/2006   OSTEOPENIA 11/19/2008   PARESTHESIA 03/05/2009   PVC's (premature ventricular contractions)    Scoliosis    VARICOSE VEINS, LOWER EXTREMITIES 08/29/2006    Family History  Problem Relation Age of Onset   Stroke Mother    Cancer Father    Emphysema Father    Hypertension Other    Breast cancer Neg Hx    Past Surgical History:  Procedure Laterality Date   ABDOMINAL HYSTERECTOMY  1981   APPENDECTOMY     BACK SURGERY  2010,2006   lumb fusion   BUNIONECTOMY     both   BUNIONECTOMY Right 1985   CARPAL TUNNEL RELEASE     both   CARPAL TUNNEL RELEASE Right 1992   CARPAL TUNNEL RELEASE Left 1992   CATARACT EXTRACTION     both   CATARACT EXTRACTION Right 2008   CATARACT EXTRACTION Left 2008   HAMMER TOE SURGERY Left 1987   HAMMER TOE SURGERY Right 1986   LEFT AND RIGHT HEART CATHETERIZATION WITH CORONARY ANGIOGRAM N/A 10/04/2013   Procedure: LEFT AND RIGHT HEART CATHETERIZATION WITH CORONARY ANGIOGRAM;  Surgeon: Alm LELON Clay, MD;  Location[facility] CATH LAB;  Service: Cardiovascular;  Laterality: N/A;   LUMBAR FUSION  2006   LUMBAR FUSION  2010   MOHS SURGERY     right cheek   OOPHORECTOMY  1967   OVARIAN CYST REMOVAL     ROTATOR CUFF REPAIR Right 2013   SHOULDER ARTHROSCOPY  02/09/2011   Procedure: ARTHROSCOPY SHOULDER;  Surgeon: Maude KANDICE Herald, MD;  Location: [facility];  Service: Orthopedics;  Laterality: Right;  right shoulder arthroscopy subacromial decompression with rotator  cuff repair   TONSILLECTOMY     Social History[1] Social History   Social History Narrative   Lives alone.       Immunization History  Administered Date(s) Administered   H1N1 02/13/2008   INFLUENZA, HIGH DOSE SEASONAL PF 10/09/2014, 11/16/2017, 11/09/2018, 11/07/2020, 10/23/2021   Influenza Split 11/23/2011, 10/30/2013, 10/09/2014   Influenza Whole 02/02/2005, 11/10/2007, 11/27/2008, 12/01/2009   Influenza-Unspecified 11/01/2012, 10/30/2013, 11/09/2018, 10/18/2019, 10/28/2022, 11/14/2023   PFIZER(Purple Top)SARS-COV-2 Vaccination 03/09/2019, 04/03/2019, 10/30/2019   Pneumococcal Conjugate-13 04/18/2013   Pneumococcal Polysaccharide-23 12/02/2005   Respiratory Syncytial Virus Vaccine,Recomb Aduvanted(Arexvy) 12/28/2021   Td 12/08/2004   Tdap 04/12/2017     Objective: Vital Signs: There were no vitals taken for this visit.   Physical Exam   Musculoskeletal Exam: ***   Investigation: No additional findings.  Imaging: VAS US  LOWER EXTREMITY VENOUS REFLUX Result Date: 02/25/2024  Lower Venous Reflux Study Patient Name:  DESAREE DOWNEN  Date of Exam:   02/24/2024 Medical Rec #: [id number]          Accession #:    [id number] Date of Birth: [birth year]          Patient Gender: F Patient Age:   35 years Exam Location:  [address] Procedure:      VAS US  LOWER EXTREMITY VENOUS REFLUX Referring Phys: FONDA ROBINS --------------------------------------------------------------------------------  Performing Technologist: Nanetta Shad RVT  Examination Guidelines: A complete evaluation includes B-mode imaging, spectral Doppler, color Doppler, and power Doppler as needed of all accessible portions of each vessel. Bilateral testing is considered an integral part of a complete examination. Limited examinations for reoccurring indications may be performed as noted. The reflux portion of the exam is performed with the patient in reverse Trendelenburg. Significant venous reflux is defined as  >500 ms in the superficial venous system, and >1 second in the deep venous system.  Venous Reflux Times +------------------------+---------+------+----------+------------+------------+ RIGHT                   Reflux NoReflux  Reflux  Diameter cmsComments                                       Yes     Time                            +------------------------+---------+------+----------+------------+------------+ CFV                     no                                                +------------------------+---------+------+----------+------------+------------+ FV mid                  no                                                +------------------------+---------+------+----------+------------+------------+ Popliteal               no                                                +------------------------+---------+------+----------+------------+------------+ GSV at SFJ                        yes   >500 ms      .55                  +------------------------+---------+------+----------+------------+------------+ GSV prox thigh          no                           .36                  +------------------------+---------+------+----------+------------+------------+ GSV mid thigh                     yes   >500 ms      .48     out of  fascia       +------------------------+---------+------+----------+------------+------------+ GSV dist thigh                    yes   >500 ms      .55     out of                                                                    fascia       +------------------------+---------+------+----------+------------+------------+ GSV at knee                       yes   >500 ms      .51     out of                                                                    fascia        +------------------------+---------+------+----------+------------+------------+ GSV prox calf                     yes   >500 ms      .41     out of                                                                    fascia       +------------------------+---------+------+----------+------------+------------+ GSV mid calf            no                           .29                  +------------------------+---------+------+----------+------------+------------+ GSV dist calf           no                           .28                  +------------------------+---------+------+----------+------------+------------+ SSV at [facility]              no                           .21                  +------------------------+---------+------+----------+------------+------------+ SSV prox calf           no                           .16                  +------------------------+---------+------+----------+------------+------------+ SSV mid calf  no                           .15                  +------------------------+---------+------+----------+------------+------------+ AASV proximal thigh     no                           .53                  +------------------------+---------+------+----------+------------+------------+ Varicosities proximal             yes   >500 ms      .58                  calf                                                                      +------------------------+---------+------+----------+------------+------------+ Varicosities mid calf             yes   >500 ms     .35.50                +------------------------+---------+------+----------+------------+------------+  +--------------------+---------+------+----------+------------+----------------+ LEFT                Reflux NoReflux  Reflux  Diameter cmsComments                                       Yes     Time                                 +--------------------+---------+------+----------+------------+----------------+ CFV                           yes  >1 second                              +--------------------+---------+------+----------+------------+----------------+ FV mid              no                                                    +--------------------+---------+------+----------+------------+----------------+ FV dist             no                                                    +--------------------+---------+------+----------+------------+----------------+ Popliteal           no                                                    +--------------------+---------+------+----------+------------+----------------+  GSV at [facility]          no                           .69                      +--------------------+---------+------+----------+------------+----------------+ GSV prox thigh      no                           .29                      +--------------------+---------+------+----------+------------+----------------+ GSV mid thigh                                            prior                                                                     ablation/strippi                                                          ng               +--------------------+---------+------+----------+------------+----------------+ GSV dist thigh                                           prior                                                                     ablation/strippi                                                          ng               +--------------------+---------+------+----------+------------+----------------+ GSV at knee                                              prior  ablation/strippi                                                          ng                +--------------------+---------+------+----------+------------+----------------+ GSV prox calf                 yes   >500 ms      .31                      +--------------------+---------+------+----------+------------+----------------+ GSV mid calf        no                           .35                      +--------------------+---------+------+----------+------------+----------------+ GSV dist calf       no                           .36                      +--------------------+---------+------+----------+------------+----------------+ SSV at [facility]          no                           .25                      +--------------------+---------+------+----------+------------+----------------+ SSV prox calf       no                           .18                      +--------------------+---------+------+----------+------------+----------------+ SSV mid calf        no                           .13                      +--------------------+---------+------+----------+------------+----------------+ AASV proximal thigh no                           .49                      +--------------------+---------+------+----------+------------+----------------+ AASV mid thigh      no                           .45                      +--------------------+---------+------+----------+------------+----------------+ Varicosities mid              yes   >500 ms      .50                      thigh                                                                     +--------------------+---------+------+----------+------------+----------------+  Varicosities distal           yes   >500 ms      .37                      thigh                                                                     +--------------------+---------+------+----------+------------+----------------+ Varicosities at the           yes   >500 ms      .89                      knee                                                                       +--------------------+---------+------+----------+------------+----------------+   Summary: Right: - Venous reflux is noted in the right saphenofemoral junction. - Venous reflux is noted in the right greater saphenous vein in the thigh. - Venous reflux is noted in the right greater saphenous vein in the calf. - Avascular cystic structure in the popliteal fossa, measuring 5.1 x 2.1 cm. Ultrasound characteristics consistent with Baker's cyst.  Left: - Venous reflux is noted in the left common femoral vein. - Venous reflux is noted in the left greater saphenous vein in the calf. - Prior great saphenous vein appears to be successfully ablated from the knee up to 3.0 cm from the saphenofemoral junction. - Avascular cystic structure in the popliteal fossa, measuring 3.3 x 2.1 cm. Ultrasound characteristics consistent with Baker's cyst.  *See table(s) above for measurements and observations. Electronically signed by Norman Serve on 02/25/2024 at 4:23:52 PM.    Final     Recent Labs: Lab Results  Component Value Date   WBC 6.7 12/20/2023   HGB 12.0 12/20/2023   PLT 326 12/20/2023   NA 134 (L) 12/20/2023   K 4.7 12/20/2023   CL 101 12/20/2023   CO2 27 12/20/2023   GLUCOSE 101 (H) 12/20/2023   BUN 18 12/20/2023   CREATININE 0.75 12/20/2023   BILITOT 0.4 12/20/2023   ALKPHOS 74 12/20/2023   AST 16 12/20/2023   ALT 13 12/20/2023   PROT 6.9 12/20/2023   ALBUMIN 3.8 12/20/2023   CALCIUM 9.0 12/20/2023   GFRAA 74 01/23/2020    Speciality Comments: No specialty comments available.  Procedures:  No procedures performed Allergies: Acetaminophen , Privigen  [immune globulin  (human)], Alendronate sodium, Codeine , Diflunisal, Doxycycline , Flexeril [cyclobenzaprine], Iodinated contrast media, Lisinopril , Nabumetone , Other, Oxybutynin, Raloxifene, Sulfa antibiotics, Symbicort  [budesonide -formoterol  fumarate], Tramadol hcl, Tramadol hcl,  Diclofenac, and Latex   Assessment / Plan:     Visit Diagnoses:  Assessment & Plan  ***  Follow-Up Instructions: No follow-ups on file.   Lonni LELON Ester, MD  Note - This record has been created using [employer].  Chart creation errors have been sought, but may not always  have been located. Such creation errors do not reflect on  the standard  of medical care.    [1]  Social History Tobacco Use   Smoking status: Never   Smokeless tobacco: Never  Vaping Use   Vaping status: Never Used  Substance Use Topics   Alcohol use: No    Alcohol/week: 0.0 standard drinks of alcohol   Drug use: No   "

## 2024-03-01 ENCOUNTER — Ambulatory Visit: Attending: Internal Medicine | Admitting: Internal Medicine

## 2024-03-01 ENCOUNTER — Ambulatory Visit: Attending: Vascular Surgery | Admitting: Vascular Surgery

## 2024-03-01 ENCOUNTER — Encounter: Payer: Self-pay | Admitting: Internal Medicine

## 2024-03-01 ENCOUNTER — Encounter: Payer: Self-pay | Admitting: Vascular Surgery

## 2024-03-01 VITALS — BP 118/68 | HR 72 | Temp 98.2°F | Resp 20 | Ht 62.0 in | Wt 130.4 lb

## 2024-03-01 VITALS — BP 124/69 | HR 67 | Temp 97.4°F | Resp 18 | Ht 63.0 in | Wt 130.5 lb

## 2024-03-01 DIAGNOSIS — M7061 Trochanteric bursitis, right hip: Secondary | ICD-10-CM | POA: Diagnosis not present

## 2024-03-01 DIAGNOSIS — M1990 Unspecified osteoarthritis, unspecified site: Secondary | ICD-10-CM

## 2024-03-01 DIAGNOSIS — G5603 Carpal tunnel syndrome, bilateral upper limbs: Secondary | ICD-10-CM | POA: Diagnosis not present

## 2024-03-01 DIAGNOSIS — I8390 Asymptomatic varicose veins of unspecified lower extremity: Secondary | ICD-10-CM

## 2024-03-01 DIAGNOSIS — I83812 Varicose veins of left lower extremities with pain: Secondary | ICD-10-CM | POA: Diagnosis not present

## 2024-03-01 DIAGNOSIS — I8393 Asymptomatic varicose veins of bilateral lower extremities: Secondary | ICD-10-CM

## 2024-03-01 MED ORDER — GABAPENTIN 100 MG PO CAPS: 100.0000 mg | ORAL_CAPSULE | Freq: Every day | ORAL | 0 refills | Status: AC

## 2024-03-01 MED ORDER — PREDNISONE 5 MG PO TABS: ORAL_TABLET | ORAL | 0 refills | Status: AC

## 2024-03-01 NOTE — Progress Notes (Signed)
 " Office Note     CC: Left lower extremity varicose veins with accompanying pain Requesting Provider:  [name] Agent, MD  HPI: Kimberly Merritt is a 84 y.o. (01-Apr-1940) female who presents at the request of Chandra Toribio POUR, MD for evaluation of left lower extremity varicose veins with accompanying pain.  On exam, Kimberly Merritt was doing well.  A native of [place], she currently lives in an elderly community in [place].  Prior to moving back to take care of her parents, she lived in [place]  for 20 years and worked as an print production planner.  Her favorite thing to do is to go outside, and work in directv garden.  Kimberly Merritt is well-known to our practice, previously undergone left sided greater saphenous vein ablation with Dr. Gerlean in 2008.  She has been seen by our practice several times after and has had at least 1 episode of bleeding varicosity several years ago.  She presents today with history of pain at the left thigh varicosities.  When she described her pain to Dr. [name], the pain was intense, now she states that the pain is very manageable, and does not bother her nearly as much.  Notes some heaviness in the left lower extremity.  Denies tenderness and left lower extremity varicosities or heaviness in the right leg.  Denies tenderness in the right lower extremity, but does No history of DVT.    Past Medical History:  Diagnosis Date   Allergy    CAD (coronary artery disease)    Nonobstructive. LAD 30% stenosis, small PDA of the circumflex 70% stenosis, right coronary artery 40% stenosis. Catheterization September 2015   Chronic cough 10/23/2014   DEPRESSION 08/29/2006   Diverticulosis    GERD 08/29/2006   HEPATIC CYST 08/02/2007   HOARSENESS 04/25/2007   Qualifier: Diagnosis of   By: Garald MD, Aleksei V      Hypertension    HYPOTHYROIDISM 08/29/2006   LIVER HEMANGIOMA 03/03/2010   LOW BACK PAIN 08/29/2006   MONOCLONAL GAMMOPATHY 03/03/2010   MUSCLE SPASM 03/03/2010   Nonischemic  cardiomyopathy (HCC)    EF 30%   OSTEOARTHRITIS 08/29/2006   OSTEOPENIA 11/19/2008   PARESTHESIA 03/05/2009   PVC's (premature ventricular contractions)    Scoliosis    VARICOSE VEINS, LOWER EXTREMITIES 08/29/2006    Past Surgical History:  Procedure Laterality Date   ABDOMINAL HYSTERECTOMY  1981   APPENDECTOMY     BACK SURGERY  2010,2006   lumb fusion   BUNIONECTOMY     both   BUNIONECTOMY Right 1985   CARPAL TUNNEL RELEASE     both   CARPAL TUNNEL RELEASE Right 1992   CARPAL TUNNEL RELEASE Left 1992   CATARACT EXTRACTION     both   CATARACT EXTRACTION Right 2008   CATARACT EXTRACTION Left 2008   HAMMER TOE SURGERY Left 1987   HAMMER TOE SURGERY Right 1986   LEFT AND RIGHT HEART CATHETERIZATION WITH CORONARY ANGIOGRAM N/A 10/04/2013   Procedure: LEFT AND RIGHT HEART CATHETERIZATION WITH CORONARY ANGIOGRAM;  Surgeon: Alm LELON Clay, MD;  Location: [facility] CATH LAB;  Service: Cardiovascular;  Laterality: N/A;   LUMBAR FUSION  2006   LUMBAR FUSION  2010   MOHS SURGERY     right cheek   OOPHORECTOMY  1967   OVARIAN CYST REMOVAL     ROTATOR CUFF REPAIR Right 2013   SHOULDER ARTHROSCOPY  02/09/2011   Procedure: ARTHROSCOPY SHOULDER;  Surgeon: Maude KANDICE Herald, MD;  Location: [facility];  Service:  Orthopedics;  Laterality: Right;  right shoulder arthroscopy subacromial decompression with rotator cuff repair   TONSILLECTOMY      Social History   Socioeconomic History   Marital status: Widowed    Spouse name: Not on file   Number of children: 0   Years of education: Not on file   Highest education level: Not on file  Occupational History   Occupation: Retired  Tobacco Use   Smoking status: Never   Smokeless tobacco: Never  Vaping Use   Vaping status: Never Used  Substance and Sexual Activity   Alcohol use: No    Alcohol/week: 0.0 standard drinks of alcohol   Drug use: No   Sexual activity: Not Currently  Other Topics Concern   Not on file  Social  History Narrative   Lives alone.     Social Drivers of Health   Tobacco Use: Low Risk (03/01/2024)   Patient History    Smoking Tobacco Use: Never    Smokeless Tobacco Use: Never    Passive Exposure: Not on file  Financial Resource Strain: Low Risk (11/03/2023)   Overall Financial Resource Strain (CARDIA)    Difficulty of Paying Living Expenses: Not hard at all  Food Insecurity: No Food Insecurity (11/03/2023)   Epic    Worried About Programme Researcher, Broadcasting/film/video in the Last Year: Never true    Ran Out of Food in the Last Year: Never true  Transportation Needs: No Transportation Needs (11/03/2023)   Epic    Lack of Transportation (Medical): No    Lack of Transportation (Non-Medical): No  Physical Activity: Inactive (11/03/2023)   Exercise Vital Sign    Days of Exercise per Week: 0 days    Minutes of Exercise per Session: 0 min  Stress: No Stress Concern Present (11/03/2023)   [employer] of Occupational Health - Occupational Stress Questionnaire    Feeling of Stress: Not at all  Social Connections: Socially Isolated (11/03/2023)   Social Connection and Isolation Panel    Frequency of Communication with Friends and Family: Twice a week    Frequency of Social Gatherings with Friends and Family: Three times a week    Attends Religious Services: Never    Active Member of Clubs or Organizations: No    Attends Banker Meetings: Never    Marital Status: Widowed  Intimate Partner Violence: Not At Risk (11/03/2023)   Epic    Fear of Current or Ex-Partner: No    Emotionally Abused: No    Physically Abused: No    Sexually Abused: No  Depression (PHQ2-9): Medium Risk (11/03/2023)   Depression (PHQ2-9)    PHQ-2 Score: 5  Alcohol Screen: Low Risk (11/03/2023)   Alcohol Screen    Last Alcohol Screening Score (AUDIT): 0  Housing: Unknown (11/03/2023)   Epic    Unable to Pay for Housing in the Last Year: No    Number of Times Moved in the Last Year: Not on file    Homeless in the  Last Year: No  Utilities: Not At Risk (11/03/2023)   Epic    Threatened with loss of utilities: No  Health Literacy: Adequate Health Literacy (11/03/2023)   B1300 Health Literacy    Frequency of need for help with medical instructions: Never   Family History  Problem Relation Age of Onset   Stroke Mother    Cancer Father    Emphysema Father    Hypertension Other    Breast cancer Neg Hx  Current Outpatient Medications  Medication Sig Dispense Refill   amoxicillin -clavulanate (AUGMENTIN ) 875-125 MG tablet Take 1 tablet by mouth 2 (two) times daily. 14 tablet 0   Carboxymethylcellulose Sodium (REFRESH LIQUIGEL OP) Apply to eye.     carvedilol  (COREG ) 12.5 MG tablet TAKE 1 TABLET BY MOUTH 2 TIMES DAILY. 180 tablet 3   Cholecalciferol (VITAMIN D3) 50 MCG (2000 UT) capsule Take 1 capsule by mouth daily.     cimetidine  (TAGAMET ) 200 MG tablet Take 200 mg by mouth daily.     cromolyn  (NASALCROM ) 5.2 MG/ACT nasal spray Place 1 spray into both nostrils 4 (four) times daily. 26 mL 5   fluticasone  (FLONASE ) 50 MCG/ACT nasal spray PLACE 2 SPRAYS INTO BOTH NOSTRILS DAILY. 16 g 7   Glucosamine-Chondroitin 1500-1200 MG/30ML LIQD Take 1 tablet by mouth daily.     losartan  (COZAAR ) 25 MG tablet TAKE 1 TABLET BY MOUTH DAILY. 90 tablet 3   ondansetron  (ZOFRAN ) 4 MG tablet 1 tablet Orally Every 8-12 hours as needed for nausea for 30 days     oxyCODONE  (OXY IR/ROXICODONE ) 5 MG immediate release tablet Take 0.5-1 tablets (2.5-5 mg total) by mouth every 6 (six) hours as needed for severe pain (pain score 7-10) or breakthrough pain. 10 tablet 0   pantoprazole  (PROTONIX ) 40 MG tablet Take 40 mg by mouth daily.     Povidone (IVIZIA DRY EYES OP) Apply to eye.     sucralfate (CARAFATE) 1 g tablet Take 1 g by mouth daily as needed.      SYNTHROID  25 MCG tablet TAKE 1 TABLET (25 MCG TOTAL) BY MOUTH DAILY BEFORE BREAKFAST. 90 tablet 1   tiZANidine (ZANAFLEX) 2 MG tablet Take 2 mg by mouth at bedtime as needed.      No current facility-administered medications for this visit.    Allergies[1]   REVIEW OF SYSTEMS:  [X]  denotes positive finding, [ ]  denotes negative finding Cardiac  Comments:  Chest pain or chest pressure:    Shortness of breath upon exertion:    Short of breath when lying flat:    Irregular heart rhythm:        Vascular    Pain in calf, thigh, or hip brought on by ambulation:    Pain in feet at night that wakes you up from your sleep:     Blood clot in your veins:    Leg swelling:         Pulmonary    Oxygen at home:    Productive cough:     Wheezing:         Neurologic    Sudden weakness in arms or legs:     Sudden numbness in arms or legs:     Sudden onset of difficulty speaking or slurred speech:    Temporary loss of vision in one eye:     Problems with dizziness:         Gastrointestinal    Blood in stool:     Vomited blood:         Genitourinary    Burning when urinating:     Blood in urine:        Psychiatric    Major depression:         Hematologic    Bleeding problems:    Problems with blood clotting too easily:        Skin    Rashes or ulcers:        Constitutional    Fever or chills:  PHYSICAL EXAMINATION:  Vitals:   03/01/24 1041  BP: 118/68  Pulse: 72  Resp: 20  Temp: 98.2 F (36.8 C)  TempSrc: Temporal  SpO2: 99%  Weight: 130 lb 6.4 oz (59.1 kg)  Height: 5' 2 (1.575 m)    General:  WDWN in NAD; vital signs documented above Gait: Not observed HENT: WNL, normocephalic Pulmonary: normal non-labored breathing , without Rales, rhonchi,  wheezing Cardiac: regular HR Abdomen: soft, NT, no masses Skin: without rashes Vascular Exam/Pulses:  Right Left  Radial 2+ (normal) 2+ (normal)          Dorsalis pedis 2+ 2+           Extremities: without ischemic changes, without Gangrene , without cellulitis; without open wounds;  Musculoskeletal: no muscle wasting or atrophy  Neurologic: A&O X 3;  No focal weakness or  paresthesias are detected Psychiatric:  The pt has Normal affect.   Non-Invasive Vascular Imaging:     Right:  - Venous reflux is noted in the right saphenofemoral junction.  - Venous reflux is noted in the right greater saphenous vein in the thigh.  - Venous reflux is noted in the right greater saphenous vein in the calf.  - Avascular cystic structure in the popliteal fossa, measuring 5.1 x 2.1  cm. Ultrasound characteristics consistent with Baker's cyst.    Left:  - Venous reflux is noted in the left common femoral vein.  - Venous reflux is noted in the left greater saphenous vein in the calf.  - Prior great saphenous vein appears to be successfully ablated from the  knee up to 3.0 cm from the saphenofemoral junction.  - Avascular cystic structure in the popliteal fossa, measuring 3.3 x 2.1  cm. Ultrasound characteristics consistent with Baker's cyst.     ASSESSMENT/PLAN:: 84 y.o. female presenting with some pain at the left thigh varicosities.  She states these varicosities been there for years.  States pain has improved since initially seen by her cardiologist Dr. [name].  I had a long conversation with Kimberly Merritt regarding the above.  We reviewed her imaging which demonstrated that the greater saphenous vein on the left remains occluded after ablation.  We discussed that removal of the thigh varicosities is possible through stab phlebectomy, however she would benefit most from medical management via thigh-high compression.  She was local that she was not interested in stab phlebectomy.  She was happy to use thigh-high compression, which she was measured for today in the office.  I asked her to call my office should any questions or concerns arise, or should the pain in the varicosities worsen.    Fonda FORBES Rim, MD [facility] [phone]     [1]  Allergies Allergen Reactions   Acetaminophen  Rash    REACTION: high dose - tremor Per pt - rash on chest  and heart palpitations   Privigen  [Immune Globulin  (Human)] Other (See Comments)    Pt had a rxn; see hypersensitivity note from 01/15/22 at 1259.    Alendronate Sodium     Stomach upset.    Codeine      May interfere with heart condition     Diflunisal     Stomach pain.     Doxycycline      n/v   Flexeril [Cyclobenzaprine]     Abdominal pain.     Iodinated Contrast Media Other (See Comments)    Syncope.    Lisinopril  Other (See Comments)   Nabumetone      Abdominal pain;  dark, tarry stool.     Other Other (See Comments)   Oxybutynin     Pt does not remember.     Raloxifene     Unknown.     Sulfa Antibiotics Nausea Only    Syncope.     Symbicort  [Budesonide -Formoterol  Fumarate]     Jittery-ness.    Tramadol Hcl     Numbness.     Tramadol Hcl Itching    Numbness.     Diclofenac Palpitations    Dark, tarry stool.     Latex Rash   "

## 2024-03-01 NOTE — Assessment & Plan Note (Addendum)
 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 Current symptoms include pain and swelling highly suggestive for symptom recurrence. Will see if this resolve after prednisone  taper but low threshold to try US  guided injection in f/u. - Ordered ultrasound of hands to assess for recurrence of carpal tunnel syndrome.  Orders:   gabapentin  (NEURONTIN ) 100 MG capsule; Take 1 capsule (100 mg total) by mouth at bedtime.   predniSONE  (DELTASONE ) 5 MG tablet; Take 4 tablets (20 mg total) by mouth daily with breakfast for 3 days, THEN 3 tablets (15 mg total) daily with breakfast for 3 days, THEN 2 tablets (10 mg total) daily with breakfast for 3 days, THEN 1 tablet (5 mg total) daily with breakfast for 3 days.

## 2024-03-04 MED ORDER — TRIAMCINOLONE ACETONIDE 40 MG/ML IJ SUSP
40.0000 mg | INTRAMUSCULAR | Status: AC | PRN
  Administered 2024-03-01: 40 mg via INTRA_ARTICULAR

## 2024-03-04 MED ORDER — LIDOCAINE HCL 1 % IJ SOLN
2.0000 mL | INTRAMUSCULAR | Status: AC | PRN
  Administered 2024-03-01: 2 mL

## 2024-04-17 ENCOUNTER — Ambulatory Visit: Admitting: Internal Medicine

## 2024-04-18 ENCOUNTER — Ambulatory Visit: Admitting: Family Medicine

## 2024-04-24 ENCOUNTER — Ambulatory Visit: Admitting: Internal Medicine

## 2024-07-19 ENCOUNTER — Inpatient Hospital Stay: Attending: Hematology and Oncology

## 2024-07-19 ENCOUNTER — Inpatient Hospital Stay: Admitting: Hematology and Oncology

## 2025-01-10 ENCOUNTER — Ambulatory Visit
